# Patient Record
Sex: Male | Born: 1945
Health system: Southern US, Community
[De-identification: ages and names within clinical notes are randomized; demographics above are authoritative.]

## PROBLEM LIST (undated history)

## (undated) DIAGNOSIS — I2510 Atherosclerotic heart disease of native coronary artery without angina pectoris: Secondary | ICD-10-CM

## (undated) DIAGNOSIS — Z9289 Personal history of other medical treatment: Secondary | ICD-10-CM

## (undated) DIAGNOSIS — G4730 Sleep apnea, unspecified: Secondary | ICD-10-CM

## (undated) DIAGNOSIS — E079 Disorder of thyroid, unspecified: Secondary | ICD-10-CM

## (undated) DIAGNOSIS — E039 Hypothyroidism, unspecified: Secondary | ICD-10-CM

## (undated) DIAGNOSIS — E785 Hyperlipidemia, unspecified: Secondary | ICD-10-CM

## (undated) DIAGNOSIS — J449 Chronic obstructive pulmonary disease, unspecified: Secondary | ICD-10-CM

## (undated) DIAGNOSIS — J4489 Other specified chronic obstructive pulmonary disease: Secondary | ICD-10-CM

## (undated) DIAGNOSIS — M129 Arthropathy, unspecified: Secondary | ICD-10-CM

## (undated) DIAGNOSIS — I10 Essential (primary) hypertension: Secondary | ICD-10-CM

## (undated) DIAGNOSIS — R51 Headache: Secondary | ICD-10-CM

## (undated) DIAGNOSIS — I209 Angina pectoris, unspecified: Secondary | ICD-10-CM

## (undated) DIAGNOSIS — M1990 Unspecified osteoarthritis, unspecified site: Secondary | ICD-10-CM

## (undated) HISTORY — PX: HIP ARTHROPLASTY: SHX981

## (undated) HISTORY — PX: CORONARY STENT PLACEMENT: SHX1402

## (undated) HISTORY — DX: Other specified chronic obstructive pulmonary disease: J44.89

## (undated) HISTORY — PX: HEMORRHOID SURGERY: SHX153

## (undated) HISTORY — DX: Essential (primary) hypertension: I10

## (undated) HISTORY — PX: ANKLE SURGERY: SHX546

## (undated) HISTORY — DX: Arthropathy, unspecified: M12.9

## (undated) HISTORY — PX: TONSILLECTOMY: SUR1361

## (undated) HISTORY — DX: Sleep apnea, unspecified: G47.30

## (undated) HISTORY — DX: Hyperlipidemia, unspecified: E78.5

## (undated) HISTORY — DX: Atherosclerotic heart disease of native coronary artery without angina pectoris: I25.10

## (undated) HISTORY — DX: Chronic obstructive pulmonary disease, unspecified: J44.9

---

## 1999-05-05 ENCOUNTER — Inpatient Hospital Stay (HOSPITAL_COMMUNITY): Admission: EM | Admit: 1999-05-05 | Discharge: 1999-05-07 | Payer: Self-pay | Admitting: *Deleted

## 1999-05-05 ENCOUNTER — Encounter: Payer: Self-pay | Admitting: *Deleted

## 2000-09-07 ENCOUNTER — Encounter (HOSPITAL_COMMUNITY): Admission: RE | Admit: 2000-09-07 | Discharge: 2000-10-07 | Payer: Self-pay | Admitting: Rheumatology

## 2000-09-07 ENCOUNTER — Encounter: Payer: Self-pay | Admitting: Rheumatology

## 2001-01-09 ENCOUNTER — Ambulatory Visit: Admission: RE | Admit: 2001-01-09 | Discharge: 2001-01-09 | Payer: Self-pay | Admitting: Pulmonary Disease

## 2001-06-13 ENCOUNTER — Ambulatory Visit (HOSPITAL_COMMUNITY): Admission: RE | Admit: 2001-06-13 | Discharge: 2001-06-13 | Payer: Self-pay | Admitting: *Deleted

## 2001-06-13 ENCOUNTER — Encounter: Payer: Self-pay | Admitting: *Deleted

## 2005-02-08 ENCOUNTER — Ambulatory Visit (HOSPITAL_COMMUNITY): Admission: RE | Admit: 2005-02-08 | Discharge: 2005-02-08 | Payer: Self-pay | Admitting: Orthopedic Surgery

## 2005-03-02 ENCOUNTER — Encounter: Admission: RE | Admit: 2005-03-02 | Discharge: 2005-03-17 | Payer: Self-pay | Admitting: Orthopedic Surgery

## 2005-03-23 ENCOUNTER — Ambulatory Visit: Payer: Self-pay | Admitting: Cardiology

## 2005-03-23 ENCOUNTER — Inpatient Hospital Stay (HOSPITAL_COMMUNITY): Admission: EM | Admit: 2005-03-23 | Discharge: 2005-03-25 | Payer: Self-pay | Admitting: Emergency Medicine

## 2005-04-14 ENCOUNTER — Ambulatory Visit: Payer: Self-pay | Admitting: Cardiology

## 2005-05-18 ENCOUNTER — Ambulatory Visit: Payer: Self-pay | Admitting: Cardiology

## 2005-10-12 ENCOUNTER — Ambulatory Visit: Payer: Self-pay | Admitting: Cardiology

## 2005-10-18 ENCOUNTER — Ambulatory Visit: Payer: Self-pay

## 2006-04-12 ENCOUNTER — Ambulatory Visit: Payer: Self-pay | Admitting: Cardiology

## 2006-04-12 LAB — CONVERTED CEMR LAB
ALT: 15 units/L (ref 0–40)
AST: 17 units/L (ref 0–37)
Albumin: 3.7 g/dL (ref 3.5–5.2)
Alkaline Phosphatase: 63 units/L (ref 39–117)
BUN: 13 mg/dL (ref 6–23)
Bilirubin, Direct: 0.1 mg/dL (ref 0.0–0.3)
Chloride: 109 meq/L (ref 96–112)
Direct LDL: 70 mg/dL
GFR calc non Af Amer: 72 mL/min
Glucose, Bld: 100 mg/dL — ABNORMAL HIGH (ref 70–99)
Hgb A1c MFr Bld: 5.9 % (ref 4.6–6.0)
Potassium: 4.4 meq/L (ref 3.5–5.1)
Sodium: 142 meq/L (ref 135–145)
TSH: 1.51 microintl units/mL (ref 0.35–5.50)
VLDL: 56 mg/dL — ABNORMAL HIGH (ref 0–40)

## 2007-04-10 ENCOUNTER — Ambulatory Visit: Payer: Self-pay | Admitting: Cardiology

## 2007-04-17 ENCOUNTER — Ambulatory Visit: Payer: Self-pay | Admitting: Cardiology

## 2007-04-17 ENCOUNTER — Ambulatory Visit: Payer: Self-pay

## 2007-04-17 LAB — CONVERTED CEMR LAB
AST: 20 units/L (ref 0–37)
Albumin: 3.8 g/dL (ref 3.5–5.2)
Alkaline Phosphatase: 47 units/L (ref 39–117)
Bilirubin, Direct: 0.1 mg/dL (ref 0.0–0.3)
Chloride: 107 meq/L (ref 96–112)
Cholesterol: 149 mg/dL (ref 0–200)
HDL: 43 mg/dL (ref >39.0)
LDL Cholesterol: 89 mg/dL (ref 0–99)
Total Protein: 6.5 g/dL (ref 6.0–8.3)
Triglycerides: 86 mg/dL (ref 0–149)
VLDL: 17 mg/dL (ref 0–40)

## 2007-05-01 ENCOUNTER — Ambulatory Visit: Payer: Self-pay | Admitting: Gastroenterology

## 2007-05-01 ENCOUNTER — Encounter: Payer: Self-pay | Admitting: Gastroenterology

## 2007-05-01 DIAGNOSIS — K922 Gastrointestinal hemorrhage, unspecified: Secondary | ICD-10-CM | POA: Insufficient documentation

## 2007-05-01 DIAGNOSIS — M129 Arthropathy, unspecified: Secondary | ICD-10-CM | POA: Insufficient documentation

## 2007-05-01 DIAGNOSIS — J449 Chronic obstructive pulmonary disease, unspecified: Secondary | ICD-10-CM

## 2007-05-01 DIAGNOSIS — G4730 Sleep apnea, unspecified: Secondary | ICD-10-CM | POA: Insufficient documentation

## 2007-05-01 DIAGNOSIS — M1710 Unilateral primary osteoarthritis, unspecified knee: Secondary | ICD-10-CM

## 2007-05-01 DIAGNOSIS — I10 Essential (primary) hypertension: Secondary | ICD-10-CM

## 2007-05-01 DIAGNOSIS — E785 Hyperlipidemia, unspecified: Secondary | ICD-10-CM | POA: Insufficient documentation

## 2007-05-14 ENCOUNTER — Encounter: Payer: Self-pay | Admitting: Gastroenterology

## 2007-05-14 ENCOUNTER — Ambulatory Visit: Payer: Self-pay | Admitting: Gastroenterology

## 2007-05-16 ENCOUNTER — Encounter: Payer: Self-pay | Admitting: Gastroenterology

## 2008-03-31 ENCOUNTER — Inpatient Hospital Stay (HOSPITAL_COMMUNITY): Admission: RE | Admit: 2008-03-31 | Discharge: 2008-04-03 | Payer: Self-pay | Admitting: Orthopedic Surgery

## 2008-08-05 ENCOUNTER — Ambulatory Visit: Payer: Self-pay | Admitting: Cardiology

## 2008-08-06 ENCOUNTER — Encounter: Payer: Self-pay | Admitting: Internal Medicine

## 2008-08-06 ENCOUNTER — Ambulatory Visit: Payer: Self-pay | Admitting: Internal Medicine

## 2008-08-06 ENCOUNTER — Encounter: Payer: Self-pay | Admitting: Cardiology

## 2008-08-06 ENCOUNTER — Inpatient Hospital Stay (HOSPITAL_COMMUNITY): Admission: EM | Admit: 2008-08-06 | Discharge: 2008-08-08 | Payer: Self-pay | Admitting: Emergency Medicine

## 2008-08-11 ENCOUNTER — Encounter: Payer: Self-pay | Admitting: Internal Medicine

## 2008-08-15 ENCOUNTER — Encounter: Payer: Self-pay | Admitting: Gastroenterology

## 2008-08-18 ENCOUNTER — Telehealth (INDEPENDENT_AMBULATORY_CARE_PROVIDER_SITE_OTHER): Payer: Self-pay | Admitting: *Deleted

## 2008-09-18 ENCOUNTER — Ambulatory Visit: Payer: Self-pay | Admitting: Cardiology

## 2008-09-18 ENCOUNTER — Encounter (INDEPENDENT_AMBULATORY_CARE_PROVIDER_SITE_OTHER): Payer: Self-pay | Admitting: *Deleted

## 2008-09-18 DIAGNOSIS — K219 Gastro-esophageal reflux disease without esophagitis: Secondary | ICD-10-CM | POA: Insufficient documentation

## 2008-09-25 ENCOUNTER — Ambulatory Visit (HOSPITAL_COMMUNITY): Admission: RE | Admit: 2008-09-25 | Discharge: 2008-09-25 | Payer: Self-pay | Admitting: Orthopedic Surgery

## 2008-10-16 ENCOUNTER — Encounter (INDEPENDENT_AMBULATORY_CARE_PROVIDER_SITE_OTHER): Payer: Self-pay | Admitting: *Deleted

## 2008-11-02 ENCOUNTER — Encounter: Admission: RE | Admit: 2008-11-02 | Discharge: 2008-11-02 | Payer: Self-pay | Admitting: Orthopedic Surgery

## 2009-08-26 ENCOUNTER — Encounter (INDEPENDENT_AMBULATORY_CARE_PROVIDER_SITE_OTHER): Payer: Self-pay | Admitting: Orthopedic Surgery

## 2009-08-26 ENCOUNTER — Inpatient Hospital Stay (HOSPITAL_COMMUNITY): Admission: RE | Admit: 2009-08-26 | Discharge: 2009-08-28 | Payer: Self-pay | Admitting: Orthopedic Surgery

## 2009-11-27 ENCOUNTER — Emergency Department (HOSPITAL_COMMUNITY): Admission: EM | Admit: 2009-11-27 | Discharge: 2009-11-27 | Payer: Self-pay | Admitting: Emergency Medicine

## 2010-03-18 LAB — CBC
HCT: 32.4 % — ABNORMAL LOW (ref 39.0–52.0)
HCT: 34.2 % — ABNORMAL LOW (ref 39.0–52.0)
HCT: 46.6 % (ref 39.0–52.0)
Hemoglobin: 10.9 g/dL — ABNORMAL LOW (ref 13.0–17.0)
Hemoglobin: 11.7 g/dL — ABNORMAL LOW (ref 13.0–17.0)
MCH: 31.8 pg (ref 26.0–34.0)
MCV: 91 fL (ref 78.0–100.0)
MCV: 91.3 fL (ref 78.0–100.0)
MCV: 92.4 fL (ref 78.0–100.0)
Platelets: 162 10*3/uL (ref 150–400)
Platelets: 173 10*3/uL (ref 150–400)
RBC: 3.55 MIL/uL — ABNORMAL LOW (ref 4.22–5.81)
RBC: 3.7 MIL/uL — ABNORMAL LOW (ref 4.22–5.81)
RDW: 13.6 % (ref 11.5–15.5)
WBC: 6.8 10*3/uL (ref 4.0–10.5)
WBC: 9 10*3/uL (ref 4.0–10.5)
WBC: 9.8 10*3/uL (ref 4.0–10.5)

## 2010-03-18 LAB — URINALYSIS, ROUTINE W REFLEX MICROSCOPIC
Bilirubin Urine: NEGATIVE
Ketones, ur: NEGATIVE mg/dL
Urobilinogen, UA: 1 mg/dL (ref 0.0–1.0)
pH: 6 (ref 5.0–8.0)

## 2010-03-18 LAB — DIFFERENTIAL
Basophils Absolute: 0 10*3/uL (ref 0.0–0.1)
Basophils Relative: 0 % (ref 0–1)
Eosinophils Absolute: 0.1 10*3/uL (ref 0.0–0.7)
Lymphocytes Relative: 41 % (ref 12–46)
Monocytes Absolute: 0.7 10*3/uL (ref 0.1–1.0)
Monocytes Relative: 10 % (ref 3–12)
Neutrophils Relative %: 47 % (ref 43–77)

## 2010-03-18 LAB — BASIC METABOLIC PANEL
BUN: 12 mg/dL (ref 6–23)
CO2: 25 mEq/L (ref 19–32)
CO2: 28 mEq/L (ref 19–32)
Chloride: 104 mEq/L (ref 96–112)
GFR calc Af Amer: >60 mL/min (ref >60)
Glucose, Bld: 118 mg/dL — ABNORMAL HIGH (ref 70–99)
Potassium: 4.2 mEq/L (ref 3.5–5.1)
Sodium: 136 mEq/L (ref 135–145)
Sodium: 140 mEq/L (ref 135–145)

## 2010-03-18 LAB — ANAEROBIC CULTURE

## 2010-03-18 LAB — APTT: aPTT: 28 seconds (ref 24–37)

## 2010-03-18 LAB — WOUND CULTURE: Culture: NO GROWTH

## 2010-03-18 LAB — GRAM STAIN

## 2010-03-18 LAB — TYPE AND SCREEN: ABO/RH(D): O POS

## 2010-04-10 LAB — COMPREHENSIVE METABOLIC PANEL
BUN: 14 mg/dL (ref 6–23)
Calcium: 8.8 mg/dL (ref 8.4–10.5)
Glucose, Bld: 109 mg/dL — ABNORMAL HIGH (ref 70–99)
Total Protein: 6 g/dL (ref 6.0–8.3)

## 2010-04-10 LAB — BASIC METABOLIC PANEL
CO2: 23 mEq/L (ref 19–32)
Calcium: 8.9 mg/dL (ref 8.4–10.5)
GFR calc Af Amer: >60 mL/min (ref >60)
GFR calc Af Amer: >60 mL/min (ref >60)
GFR calc non Af Amer: >60 mL/min (ref >60)
GFR calc non Af Amer: >60 mL/min (ref >60)
Glucose, Bld: 121 mg/dL — ABNORMAL HIGH (ref 70–99)
Potassium: 3.7 mEq/L (ref 3.5–5.1)
Potassium: 3.8 mEq/L (ref 3.5–5.1)
Sodium: 137 mEq/L (ref 135–145)
Sodium: 137 mEq/L (ref 135–145)

## 2010-04-10 LAB — LIPID PANEL
LDL Cholesterol: 75 mg/dL (ref 0–99)
Total CHOL/HDL Ratio: 2.9 RATIO
VLDL: 12 mg/dL (ref 0–40)

## 2010-04-10 LAB — WOUND CULTURE: Gram Stain: NONE SEEN

## 2010-04-10 LAB — CBC
HCT: 39.4 % (ref 39.0–52.0)
Hemoglobin: 13.7 g/dL (ref 13.0–17.0)
Hemoglobin: 14 g/dL (ref 13.0–17.0)
MCHC: 34.4 g/dL (ref 30.0–36.0)
RBC: 4.27 MIL/uL (ref 4.22–5.81)
RBC: 4.41 MIL/uL (ref 4.22–5.81)
RDW: 14.3 % (ref 11.5–15.5)

## 2010-04-10 LAB — POCT CARDIAC MARKERS
CKMB, poc: <1 ng/mL — ABNORMAL LOW (ref 1.0–8.0)
Myoglobin, poc: 78 ng/mL (ref 12–200)
Troponin i, poc: <0.05 ng/mL (ref 0.00–0.09)

## 2010-04-10 LAB — DIFFERENTIAL
Lymphocytes Relative: 14 % (ref 12–46)
Monocytes Absolute: 1.1 10*3/uL — ABNORMAL HIGH (ref 0.1–1.0)
Monocytes Relative: 10 % (ref 3–12)
Neutro Abs: 8.5 10*3/uL — ABNORMAL HIGH (ref 1.7–7.7)

## 2010-04-10 LAB — CK TOTAL AND CKMB (NOT AT ARMC)
Relative Index: INVALID (ref 0.0–2.5)
Total CK: 95 U/L (ref 7–232)

## 2010-04-10 LAB — CARDIAC PANEL(CRET KIN+CKTOT+MB+TROPI)
Relative Index: INVALID (ref 0.0–2.5)
Total CK: 67 U/L (ref 7–232)
Total CK: 72 U/L (ref 7–232)

## 2010-04-10 LAB — HEMOGLOBIN A1C
Hgb A1c MFr Bld: 5.8 % (ref 4.6–6.1)
Mean Plasma Glucose: 120 mg/dL

## 2010-04-10 LAB — PROTIME-INR
INR: 1.1 (ref 0.00–1.49)
Prothrombin Time: 14.5 seconds (ref 11.6–15.2)

## 2010-04-14 LAB — CBC
Platelets: 206 10*3/uL (ref 150–400)
WBC: 8.7 10*3/uL (ref 4.0–10.5)

## 2010-04-14 LAB — PROTIME-INR
INR: 1.7 — ABNORMAL HIGH (ref 0.00–1.49)
Prothrombin Time: 21.3 seconds — ABNORMAL HIGH (ref 11.6–15.2)

## 2010-04-15 LAB — BASIC METABOLIC PANEL
BUN: 12 mg/dL (ref 6–23)
CO2: 25 mEq/L (ref 19–32)
Chloride: 102 mEq/L (ref 96–112)
Creatinine, Ser: 1.18 mg/dL (ref 0.4–1.5)
Creatinine, Ser: 1.22 mg/dL (ref 0.4–1.5)
GFR calc Af Amer: >60 mL/min (ref >60)
GFR calc non Af Amer: >60 mL/min (ref >60)
Glucose, Bld: 104 mg/dL — ABNORMAL HIGH (ref 70–99)
Potassium: 4 mEq/L (ref 3.5–5.1)
Sodium: 133 mEq/L — ABNORMAL LOW (ref 135–145)

## 2010-04-15 LAB — URINALYSIS, ROUTINE W REFLEX MICROSCOPIC
Glucose, UA: 100 mg/dL — AB
Hgb urine dipstick: NEGATIVE
Ketones, ur: NEGATIVE mg/dL
Protein, ur: NEGATIVE mg/dL
Urobilinogen, UA: 1 mg/dL (ref 0.0–1.0)

## 2010-04-15 LAB — CBC
HCT: 29.5 % — ABNORMAL LOW (ref 39.0–52.0)
HCT: 34.3 % — ABNORMAL LOW (ref 39.0–52.0)
Hemoglobin: 10.4 g/dL — ABNORMAL LOW (ref 13.0–17.0)
Hemoglobin: 12 g/dL — ABNORMAL LOW (ref 13.0–17.0)
MCHC: 34.8 g/dL (ref 30.0–36.0)
MCHC: 35.3 g/dL (ref 30.0–36.0)
MCV: 92.6 fL (ref 78.0–100.0)
MCV: 92.6 fL (ref 78.0–100.0)
MCV: 93.4 fL (ref 78.0–100.0)
Platelets: 245 10*3/uL (ref 150–400)
RBC: 3.18 MIL/uL — ABNORMAL LOW (ref 4.22–5.81)
RBC: 3.68 MIL/uL — ABNORMAL LOW (ref 4.22–5.81)
RDW: 13.4 % (ref 11.5–15.5)
RDW: 13.7 % (ref 11.5–15.5)
WBC: 6.2 10*3/uL (ref 4.0–10.5)
WBC: 9 10*3/uL (ref 4.0–10.5)

## 2010-04-15 LAB — PROTIME-INR
INR: 1 (ref 0.00–1.49)
INR: 1.9 — ABNORMAL HIGH (ref 0.00–1.49)
Prothrombin Time: 13.2 seconds (ref 11.6–15.2)
Prothrombin Time: 16.1 seconds — ABNORMAL HIGH (ref 11.6–15.2)

## 2010-04-15 LAB — DIFFERENTIAL
Basophils Absolute: 0 10*3/uL (ref 0.0–0.1)
Eosinophils Absolute: 0.1 10*3/uL (ref 0.0–0.7)
Lymphocytes Relative: 40 % (ref 12–46)
Lymphs Abs: 2.5 10*3/uL (ref 0.7–4.0)
Neutrophils Relative %: 47 % (ref 43–77)

## 2010-04-15 LAB — TYPE AND SCREEN: ABO/RH(D): O POS

## 2010-04-15 LAB — ABO/RH: ABO/RH(D): O POS

## 2010-04-23 ENCOUNTER — Ambulatory Visit (INDEPENDENT_AMBULATORY_CARE_PROVIDER_SITE_OTHER): Payer: Medicare Other | Admitting: Urology

## 2010-04-23 DIAGNOSIS — L723 Sebaceous cyst: Secondary | ICD-10-CM

## 2010-05-18 NOTE — Assessment & Plan Note (Signed)
[facility]                            CARDIOLOGY OFFICE NOTE   NAME:[name], [name]                     MRN:          [id number]  DATE:04/10/2007                            DOB:          [birth year]    ADDENDUM:  It appears he is not on his Lipitor at least by my records.  Will have to check fasting lipids and see what his results are and then  try to get him back on a statin.   PHYSICAL EXAMINATION:  VITAL SIGNS:  His blood pressure today is 149/80.  I rechecked it, and it was 130/80.  His pulse is 61 and regular.  Weight  is 245, up 9 pounds.  HEENT:  Ruddy complexion.  PERRLA.  Extraocular movements intact.  Sclerae are clear.  Facial symmetry is normal.  NECK:  Carotid upstrokes are equal bilaterally without bruits, no JVD.  Thyroid is not enlarged.  Neck is supple.  LUNGS:  Clear.  HEART:  Nondisplaced PMI.  Normal S1-S2, split S2.  ABDOMEN:  Protuberant, good bowel sounds.  There was no obvious  organomegaly.  Bowel sounds are present.  EXTREMITIES:  No cyanosis or clubbing.  1+ edema.  Pulses are intact.  NEUROLOGIC:  Intact.  MUSCULOSKELETAL:  Chronic arthritic changes.   Electrocardiogram shows sinus brady with left axis deviation.   ASSESSMENT AND PLAN:  [name] needs to do some major lifestyle  changes.  We have talked about low-sodium diets at length.  We have also  talked about dropping about 9 pounds.  His risk of diabetes is  substantial.   We also need to check fasting lipids and a comprehensive metabolic  panel.  I am not sure why he is not on a statin.   We will arrange for him to have an adenosine rest stress Myoview to  follow up on his stent.     Thomas C. Daleen Squibb, MD, [facility]     TCW/MedQ  DD: 04/10/2007  DT: 04/10/2007  Job #: [id number]

## 2010-05-18 NOTE — Assessment & Plan Note (Signed)
[facility]                            CARDIOLOGY OFFICE NOTE   [name]                     MRN:          [id number]  DATE:04/10/2007                            DOB:          1945-04-06    [name] returns today.   PROBLEM LIST:  1. Coronary artery disease.  He is 2 years out from a drug-eluting      stent to the mid circumflex.  He has had multiple interventions      before.  Please refer to the chart.  He is having occasional      episodes of chest tightness but it is really not exertion related.      He is not taking nitroglycerin.  He has significant dyspnea on      exertion which I think is probably noncardiac.  2. Hyperlipidemia.  He has not had his lipids checked in a year.  He      says these are checked by his primary.  3. Mild obesity.  He has gained 9 pounds.  4. Hypertension.  He unfortunately is eating a fair amount of salt.   He denies orthopnea, PND or peripheral edema.   CURRENT MEDICATIONS:  1. Aspirin 325 mg a day.  2. Metoprolol 12.5 mg p.o. daily.  3. Lisinopril 10 mg a daily.  4. Levothyroxine 175 mcg a day.   It appears he is not on his Lipitor at least by my records.  Will have  to check fasting lipids and see what his results are and then try to get  him back on a statin.   PHYSICAL EXAMINATION:  VITAL SIGNS:  His blood pressure today is 149/80.  I rechecked it, and it was 130/80.  His pulse is 61 and regular.  Weight  is 245, up 9 pounds.  HEENT:  Ruddy complexion.  PERRLA.  Extraocular movements intact.  Sclerae are clear.  Facial symmetry is normal.  NECK:  Carotid upstrokes are equal bilaterally without bruits, no JVD.  Thyroid is not enlarged.  Neck is supple.  LUNGS:  Clear.  HEART:  Nondisplaced PMI.  Normal S1-S2, split S2.  ABDOMEN:  Protuberant, good bowel sounds.  There was no obvious  organomegaly.  Bowel sounds are present.  EXTREMITIES:  No cyanosis or clubbing.  1+ edema.  Pulses  are intact.  NEUROLOGIC:  Intact.  MUSCULOSKELETAL:  Chronic arthritic changes.   Electrocardiogram shows sinus brady with left axis deviation.   ASSESSMENT AND PLAN:  [name] needs to do some major lifestyle  changes.  We have talked about low-sodium diets at length.  We have also  talked about dropping about 9 pounds.  His risk of diabetes is  substantial.   We also need to check fasting lipids and a comprehensive metabolic  panel.  I am not sure why he is not on a statin.   We will arrange for him to have an adenosine rest stress Myoview to  follow up on his stent.     Charles C. Daleen Squibb, MD, [facility]  Electronically Signed    TCW/MedQ  DD: 04/10/2007  DT: 04/10/2007  Job #: [id number]

## 2010-05-18 NOTE — Op Note (Signed)
[name], PRINTUP NO.:  [id number]   MEDICAL RECORD NO.:  [id number]          PATIENT TYPE:  INP   LOCATION:  2899                         FACILITY:  [facility]   PHYSICIAN:  Feliberto Gottron. Turner Daniels, M.D.   DATE OF BIRTH:  October 09, 1945   DATE OF PROCEDURE:  03/31/2008  DATE OF DISCHARGE:                               OPERATIVE REPORT   PREOPERATIVE DIAGNOSIS:  Osteoarthritis of the right hip.   POSTOPERATIVE DIAGNOSIS:  Osteoarthritis of the right hip.   PROCEDURE:  Right total hip arthroplasty using DePuy 54-mm ASR cup, 20 x  15 x 165 x 42 SROM stem, a 20-D large cone NK+0 47-mm Ultimate head.   SURGEON:  Feliberto Gottron.  Turner Daniels, MD   FIRST ASSISTANT:  Shirl Harris PA-C   ANESTHESIA:  General endotracheal.   ESTIMATED BLOOD LOSS:  400 mL.   FLUID REPLACEMENT:  1600 mL of crystalloid.   DRAINS:  Foley catheter.   URINE OUTPUT:  300 mL.   INDICATIONS FOR PROCEDURE:  A 64 year old man with end-stage arthritis  of the right hip who has failed conservative treatment with anti-  inflammatory medicine, exercises, physical therapy, intra-articular  cortisone injections that provided temporary relief, now desires  elective right total hip arthroplasty.  Risks and benefits of surgery  had been discussed, questions answered.  The pain wakes him up at night  and prevents him from doing his normal activities.  We are going to be  using a metal-on-metal bearing system because he is a very active  gentleman who will benefit from a high demand hip.   DESCRIPTION OF PROCEDURE:  The patient identified by armband and  received IV preoperative antibiotics in the holding area at [facility] and taken to operating room 15.  Appropriate anesthetic  monitors were attached and general endotracheal anesthesia was induced  with the patient in a supine position.  A Foley catheter was inserted.  He was rolled into the left lateral decubitus position and the right  lower extremity  prepped and draped in the usual sterile fashion from the  ankle to the hemipelvis.  A standard time-out procedure was then  performed.  The skin along the lateral hip and thigh infiltrated with 20  mL of 0.5% Marcaine and epinephrine solution and a 15-cm incision  centered over the greater trochanter was made through the skin and  subcutaneous tissue down to the level of the IT band.  The IT band was  cut along the skin incision allowing posterolateral approach to the hip.  Cobra retractors were placed between the gluteus minimus and the  superior hip joint capsule between the quadratus femoris and the  inferior hip joint capsule isolating the piriformis and short external  rotators which were then tagged with a #2 Ethibond suture and cut off  their insertion on the intertrochanteric crest.  This exposed posterior  aspect of the hip joint capsule which was then developed into an  acetabular based flap and likewise tagged with two #2 Ethibond sutures.  One-half of the rectus femoris origin was then released.  The hip was  flexed and internally rotated dislocating the femoral head.  Standard  neck cut performed 1 fingerbreadth above the lesser trochanter.  The  femoral head was examined and found to have bony erosion consistent with  osteoarthritis.  The proximal femur was then translated anteriorly using  a Homan retractor levering off the superior and anterior column,  exposing the acetabulum.  A posterior-inferior wing retractor was placed  at the junction of the ischium and the acetabulum and a spike Cobra in  the cotyloid notch.  This allowed [place] to remove the labrum and  sequentially ream the acetabulum up to a 53-mm basket reamer obtaining  good coverage in all quadrants.  We then touched the rim with a 54-mm  basket reamer and irrigated out with normal saline solution.  A 54-mm  ASR cup was then inserted in 45 degrees of abduction and about 20  degrees of anteversion obtaining good  fit and fill.  He could basically  life his pelvis off the table with the inserter after the implant had  been inserted.  The hip was then flexed and internally rotated exposing  the proximal femur which was entered with a box cutting chisel, the  initiating reamer with the canal finder and then sequentially reamed up  to a 14-mm axial reamer where we just started to get a little bit of  chatter and we sequentially reamed up to a 15.5 axial reamer to the  appropriate depth for a 20 x 15 stem.  We then partially reamed to 16  about two-thirds of the way down the canal.  We then conically reamed up  to a 20-D cone and did our calcar milling to a 20-D large.  The 20-D  large trial was then placed into the proximal femur followed by the  trial stem with an NK+0 47-mm trial in the same version as the calcar  about 15-20 degrees.  The hip was then relocated and taken through range  of motion.  It could not be dislocated.  In extension with external  rotation, he could flex to 90 with about 70-80 of internal rotation  before any instability was noted.  Some small peripheral osteophytes  behind the acetabulum were removed at this point.  The trial components  were removed.  The femur was irrigated out with normal saline solution.  A 20-D large ZTT cone was then hammered into place with the inserter  followed by a 20 x 15 x 165 x 42 stem in the same version as the cone.  This seated nicely and a NK+0 47-mm Ultimate ball was hammered on the  stem and the hip was reduced.  We checked our stability one more time,  it was excellent.  We repaired the capsular flap and the piriformis back  to the intertrochanteric crest through drill holes with a #2 Ethibond  suture, closed the IT band with running #1 Vicryl suture after  irrigating out one more time and going after any small bleeders with the  electrocautery.  The subcutaneous tissue was then closed with 0 and 2-0  undyed Vicryl suture and the skin with  running interlocking 3-0 nylon  suture.  A dressing of Xeroform and Mepilex was then applied.  The  patient was unclamped, rolled supine, awakened, and taken to the  recovery room without difficulty.      Feliberto Gottron. Turner Daniels, M.D.  Electronically Signed     FJR/MEDQ  D:  03/31/2008  T:  03/31/2008  Job:  [id number]

## 2010-05-18 NOTE — Discharge Summary (Signed)
[name], [name]              ACCOUNT NO.:  [id number]   MEDICAL RECORD NO.:  [id number]          PATIENT TYPE:  INP   LOCATION:  3306                         FACILITY:  [facility]   PHYSICIAN:  Noralyn Pick. Eden Emms, MD, FACCDATE OF BIRTH:  1945-07-28   DATE OF ADMISSION:  08/05/2008  DATE OF DISCHARGE:  08/08/2008                               DISCHARGE SUMMARY   PRIMARY CARDIOLOGIST:  Maisie Fus C. Wall, MD, [facility]   PRIMARY CARE PHYSICIAN:  Not listed.   PROCEDURES PERFORMED DURING HOSPITALIZATION:  1. Cardiac catheterization.  This was completed on August 05, 2008 by      Dr. Charlies Constable.      a.     Nonobstructive coronary artery disease with 30% narrowing in       the proximal left anterior descending artery, 50% narrowing in the       mid circumflex artery and 50% narrowing in the first posterior       lateral branch of the circumflex artery and 20% narrowing within       the stent in the proximal circumflex artery, 70% narrowing in the       mid right coronary artery with normal left ventricular function,       ejection fraction of 60%.  2. EGD which was dated August 06, 2008.  3. I and D of right axillary abscess on August 07, 2008.   FINAL DISCHARGE DIAGNOSES:  1. Chest pain.  2. Known history of coronary artery disease.      a.     Status post cardiac catheterization this admission.  3. Hypertension.  4. Dyslipidemia.  5. Hypothyroidism.  6. Right axillary cellulitis from questionable insect bite.   HOSPITAL COURSE:  This 65 year old Caucasian male with history of CAD  who presented to the emergency room with complaints of substernal chest  pain.  Approximately 2 weeks ago, he started having substernal chest  pain 3-4 times a day lasting 30-45 minutes at a time resolving  spontaneously.  On day of admission he began having pain around 2:00  p.m., described it as midsternal with squeezing sensation and severe  nausea and shortness of breath.  The pain had been constant for the  last  11 hours prior to admission and was relieved with sublingual  nitroglycerin.  The patient was seen and examined in the emergency room  by Armanda Magic, fellow for Dr. Daleen Squibb.  The patient was admitted to rule  out myocardial infarction with known history of CAD.  The patient was  subsequently placed on heparin and nitroglycerin, and cardiac enzymes  were cycled for further diagnostic evaluation.   The patient's cardiac enzymes were negative during hospitalization with  no EKG evidence of ischemia.  However, with recurrence of chest  discomfort in this patient, it was felt best to cath the gentleman for  further evaluation.   The patient did have cardiac catheterization as described on August 05, 2008 and was found to have nonobstructive CAD.  It was found that per  Dr. Juanda Chance that the lesion in the right coronary artery was not tight  enough  to be causing symptoms and currently not rest symptoms.  The  patient will have further evaluation of chest pain.  He had already had  a CT angio which was negative for pulmonary emboli.  It was suggested  that they plan a GI consultation as an outpatient, also outpatient  treadmill would be indicated to evaluate his right coronary artery  lesion, although it was not felt to be causing his current symptoms.  The followup stress Myoview will be discussed on a followup visit with  Dr. Daleen Squibb should he continue to have discomfort.   Infectious disease consult was requested secondary to the patient's  recurrent soft tissue abscess in the axillary area.  He was placed from  doxycycline p.o. to IV vancomycin and they suggested a General Surgery  consult for I and D.  It was found that the patient had MRSA in that  site and was continued on IV antibiotics.   The patient was seen the following day by Dr. Freida Busman and his physician  assistant and was found that the patient would benefit from an I and D  of the abscess.  He had an I and D completed on  August 07, 2008 with  packing placed.  He was placed on Septra DS p.o. b.i.d. for 1 week post  procedure.  The site was packed with sterile gauze and dressed.  Surgery  did follow up the day of discharge and the dressing was replaced and was  advised to remove the packing the day after discharge and continue to  keep it clean and was given a prescription by Surgery.    The patient was seen by GI, Dr. Lina Sar on August 06, 2008 secondary  to recurrent discomfort in his chest for GI evaluation and an EGD was  completed.  The patient was found to have stricture of the distal  esophagus, hiatal hernia, esophagitis in the distal esophagus, mild  gastritis in the antrum, otherwise normal examination.  Shallow distal  ES ulcer, mild stricture status post dilatation with 16 mm dilatator.  The patient was to await biopsy results.  The patient was to start on  PPIs twice a day for 2 weeks in the absence of cardiac disease, most  like the chest pain was GERD or esophageal spasm and esophagitis.   The patient was seen and examined by Dr. Charlton Haws on discharge and  found to be stable.  The patient will follow up with Dr. Daleen Squibb in  approximately 6 weeks.  He will also follow up with GI as an outpatient,  he is to make that appointment on his own and also follow up with  Surgery should the abscess become more inflamed, painful or have  drainage that continues to be infected.   DISCHARGE LABORATORY DATA:  Wound culture reveals no squamous epithelial  cells seen, rare Gram-positive cocci in clusters.   Sodium 137, potassium 3.7, chloride 105, CO2 of 26, glucose 121, BUN 10,  creatinine 0.90.  Hemoglobin 13.7, hematocrit 39.4, white blood cells  13.4, platelets 189, hemoglobin A1c 5.8.  TSH 2.288.  Cardiac enzymes  were found to be negative at 0.01 x3.  Cholesterol 134, triglycerides  60, HDL 47, LDL 75, magnesium 1.9.  CT angio of the chest completed on  August 4 revealing negative for pulmonary  emboli or acute  cardiopulmonary disease, infiltration of subcutaneous fat in the left  axilla, could be due to cellulitis, or possibly contusion, recommend  correlation with physical exam.  Emphysema  and simple appearing lipoma  in the left subscapularis.  CT head completed on August 06, 2008, no  acute finding, chronic microvascular ischemic changes are noted.  Chest  x-ray dated August 05, 2008, no acute disease.  EKG on discharge, normal  sinus rhythm with evidence of inferior Q waves noted in lead III and  aVF, ventricular rate of 70 beats per minute.   VITAL SIGNS ON DISCHARGE:  Temperature 98.7, heart rate 79, blood  pressure 136/69, O2 sat 94% on room air.  The patient's weight was not  recorded during this hospitalization.   DISCHARGE MEDICATIONS:  Please see discharge EMR sheet.   FOLLOWUP PLANS AND APPOINTMENTS:  1. The patient will follow up with Dr. Juanito Doom in approximately 6      weeks.  2. The patient will follow up with GI in approximately 6 weeks as      well.  3. The patient has been advised on new medication Septra DS one p.o.      b.i.d. x1 week.  4. The patient has been advised on Protonix 40 mg twice a day x2      weeks.  5. The patient has been advised by Surgery should his abscess dressing      become more erythematous, swollen, or      draining, it would appear to be infection or having increased pain      then he is to contact them. .  6. The patient has been advised to bring all medications to followup      appointments.   Time spent with the patient to include physician time, 40 minutes.      Bettey Mare. Lyman Bishop, NP      Noralyn Pick. Eden Emms, MD, [facility]  Electronically Signed    KML/MEDQ  D:  08/08/2008  T:  08/08/2008  Job:  [id number]   cc:   Hedwig Morton. Juanda Chance, MD

## 2010-05-18 NOTE — H&P (Signed)
[name], [name]              ACCOUNT NO.:  [id number]   MEDICAL RECORD NO.:  [id number]          PATIENT TYPE:  INP   LOCATION:  3306                         FACILITY:  [facility]   PHYSICIAN:  Armanda Magic, M.D.     DATE OF BIRTH:  03/17/1945   DATE OF ADMISSION:  08/05/2008  DATE OF DISCHARGE:                              HISTORY & PHYSICAL   REFERRING PHYSICIAN:  Emergency room.   CHIEF COMPLAINT:  Chest pain.   HISTORY OF PRESENT ILLNESS:  This is a 65 year old white male with a  history of coronary artery disease who presented to the emergency room  with complaints of substernal chest pain.  Around 2 weeks ago, he  started having substernal chest pain occurring 3 times per day lasting  about 30-45 minutes at a time and would resolve spontaneously.  Today,  he started having chest pain around 2 p.m., described as midsternal and  a squeezing sensation associated with severe nausea and shortness of  breath.  He also has been having headache.  Chest pain has been constant  for the past 11 hours and then relieved with sublingual nitroglycerin or  morphine.  Currently, chest pain is 7/10.  The pain is described as  different from his typical angina as well as the fact that he has had 11  hours of chest pain with no bump in his cardiac enzymes.   PAST MEDICAL HISTORY:  Coronary artery disease status post remote PCI of  the obtuse marginal and also a DES to the left circumflex in March 2007.  His last cath in 2007 at the time of his DES stent included 40% LAD, 90%  left circumflex, and 40% RCA.  He has a history of hypertension,  dyslipidemia, asthma, hypothyroidism, and obesity.   PAST SURGICAL HISTORY:  Status post hemorrhoidectomy, right ankle  repair, right hip replacement in 2010, and tonsillectomy.   MEDICATIONS:  1. Aspirin 325 mg daily.  2. Levothyroxine 150 mcg daily.  3. Lisinopril 20 mg 1/2 tablet daily.  4. Metoprolol 25 mg 1/2 tablet daily.  5. Simvastatin 40 mg a  day.  6. Hydrocodone p.r.n.  7. AndroGel pump.  8. Mobic 7.5 mg daily p.r.n.   ALLERGIES:  PERCOCET which causes nausea.   SOCIAL HISTORY:  He is married.  He denies any tobacco or alcohol use.   FAMILY HISTORY:  His mother died of a CVA and his father died of MI.   REVIEW OF SYSTEMS:  Otherwise as stated in the HPI includes redness and  swelling in the left axilla with possible insect bite, otherwise  negative.   PHYSICAL EXAMINATION:  VITAL SIGNS:  Blood pressure is 132/72, heart  rate 85, and respirations 14.  GENERAL:  He is a well-developed, obese white male in no acute distress.  HEENT:  Benign.  NECK:  Supple without lymphadenopathy.  Carotid upstrokes are +2  bilaterally.  No bruits.  LUNGS:  Clear to auscultation throughout.  HEART:  Regular rate and rhythm.  No murmurs, rubs, or gallops.  Normal  S1 and S2.  ABDOMEN:  Soft, nontender, and nondistended with  active bowel sounds.  No hepatosplenomegaly.  EXTREMITIES:  No cyanosis, erythema, or edema.  There is a small area of  erythema on the right axilla with questionable insect bite.   LABORATORY DATA:  Sodium 137, potassium 3.8, chloride 107, bicarb 23,  BUN 16, and creatinine 0.92.  White cell count 11.2, hematocrit 14,  hemoglobin 40.8, and platelet count 216.  CPK-MB less than 1, troponin  less than 0.05.  Myoglobin 78.  Head CT shows no acute findings.  There  is chronic microvascular disease.  Chest x-ray shows no acute disease.   ASSESSMENT:  1. Unstable angina with initial cardiac enzymes negative.  The      patient's pain has been constant for 12 hours with no enzyme      elevation.  2. Coronary artery disease.  3. Hypertension.  4. Dyslipidemia.  5. Hypothyroidism.  6. Right axilla cellulitis from questionable insect bite.   PLAN:  Admit to telemetry bed, rule out myocardial infarction with  serial cardiac enzymes, IV nitroglycerin drip for chest pain, subcu  Lovenox per Pharmacy for angina, aspirin  daily, doxycycline 100 mg  b.i.d. for right axillary cellulitis which will also cover if the  patient has had tick bite, further workup per Dr. Daleen Squibb.  We will make  n.p.o. after midnight, probable cath in a.m.      Armanda Magic, M.D.  Electronically Signed     TT/MEDQ  D:  08/06/2008  T:  08/06/2008  Job:  [id number]

## 2010-05-18 NOTE — Cardiovascular Report (Signed)
[name], [name]              ACCOUNT NO.:  [id number]   MEDICAL RECORD NO.:  [id number]          PATIENT TYPE:  INP   LOCATION:  3306                         FACILITY:  [facility]   PHYSICIAN:  Everardo Beals. Juanda Chance, MD, FACCDATE OF BIRTH:  May 21, 1945   DATE OF PROCEDURE:  08/05/2008  DATE OF DISCHARGE:                            CARDIAC CATHETERIZATION   CLINICAL HISTORY:  [name] is a 65 year old and had a previous  stent placed to the circumflex artery by Dr. Riley Kill in 2007.  This was  a Taxus drug-eluting stent.  He was admitted with recurrent chest pain  that has been persistent.  His ECG's and enzymes were negative.  Because  of persistent pain, he was brought to the lab urgently for evaluation.   PROCEDURE:  The procedure was performed by the right femoral arterial  sheath and 5-French preformed coronary catheters.  A front wall arterial  puncture was formed and Omnipaque contrast was used.  The patient  tolerated the procedure well and left the laboratory in satisfactory  condition.   RESULTS:  Left main coronary artery.  Left main coronary artery was free  of significant disease.   Left anterior descending artery.  Left anterior descending artery gave  rise to 2 diagonal branches and several septal perforators.  There was  irregularities and 30% narrowing in the proximal left anterior  descending, but no other major obstruction.   The circumflex artery.  The circumflex artery gave rise to an atrial  branch 2 small marginal branch, a second atrial branch, and 2 large  posterolateral branches.  There were irregularities throughout this  vessel with 50% narrowing in the midvessel and 60% narrowing in the  first posterolateral branch.  The stent in the proximal vessel had about  20% narrowing.   The right coronary artery.  The right coronary artery was a moderately  large vessel gave rise to a conus branch, 2 right ventricular branch,  posterior descending branch, and 3  small posterolateral branches.  There  was an area of narrowing in the midvessel over about 15-18 mm that was  about 70% narrowed.  The rest of vessels were free of significant  disease and there were irregularities.  The left ventriculogram.  Please  note left ventriculogram performed in the right anterior oblique  projection showed good wall motion with no areas of hypokinesis.  The  estimated ejection fraction was 60%.   CONCLUSION:  Nonobstructive coronary artery disease with 30% narrowing  in the proximal left anterior descending, 50% narrowing in the mid  circumflex artery and 50% narrowing in the first posterolateral branch  of the circumflex artery and 20% narrowing within the stent in the  proximal circumflex artery, and 70% narrowing in the mid-right coronary  with normal left ventricular function.   RECOMMENDATIONS:  I do not think the lesion in the right coronary artery  is tight enough to be causing symptoms and currently not rest symptoms.  We will plan further evaluation of his chest pain.  He has already had a  CT angio, which is negative for pulmonary embolism.  We will plan  GI  consultation.  I think an outpatient treadmill would be indicated to  evaluate his right coronary artery lesion, although I do not think it is  causing his current symptoms.      Bruce Elvera Lennox Juanda Chance, MD, [facility]  Electronically Signed     BRB/MEDQ  D:  08/06/2008  T:  08/06/2008  Job:  [id number]   cc:   Cardiopulmonary Laboratory

## 2010-05-18 NOTE — Consult Note (Signed)
NAMEKASIN, TONKINSON              ACCOUNT NO.:  [id number]   MEDICAL RECORD NO.:  [id number]           PATIENT TYPE:  INP   LOCATION:  3306                         FACILITY:  [facility]   PHYSICIAN:  Sharlet Salina T. Hoxworth, M.D.DATE OF BIRTH:  Dec 22, 1945   DATE OF CONSULTATION:  DATE OF DISCHARGE:                                 CONSULTATION   HISTORY OF PRESENT ILLNESS:  Mr. Stgermaine is a pleasant 65 year old male  who presented with chest pain with a known history of coronary artery  disease.  He was worked up from a cardiac standpoint, in fact, had a  cardiac cath due to his history of unstable angina.  The patient has had  previous percutaneous transluminal coronary angioplasty and was  concerned for restenosis.  His cardiac cath was apparently stable, but  in the mean time of this admission, the patient has had a progressively  enlarging erythematous nodule or cellulitis in the right axilla.  The  patient states this started several days prior to admission.  He has a  little bit bump, but it progressively gotten larger.  He has been  brought to the attention for surgical consultation as it is large enough  now, that it probably needs incision and drainage.   PAST MEDICAL HISTORY:  1. Pertinent for coronary artery disease.  2. Hypertension.  3. Hyperlipidemia.  4. Asthma.  5. Degenerative joint disease.  The patient denies any history of      diabetes or previous significant skin infection.   PAST SURGICAL HISTORY:  Pertinent for PTCA on multiple occasions and a  right total hip replacement earlier this year.   SOCIAL HISTORY:  Negative for alcohol or tobacco use.   ALLERGIES:  The patient reports PERCOCET allergy.   PHYSICAL EXAMINATION:  VITAL SIGNS:  Stable.  The patient is afebrile.  LUNGS:  Clear to auscultation.  HEART:  Regular rate and rhythm.  The right axillary area reveals a tender area of the erythema that is  indurated with central fluctuance and a small white  head.  This is most  consistent with abscess cavity.  Pending spontaneous drainage.  The  remainder of the extremity and skin exam was within normal limits  without additional lesions.   DIAGNOSTIC DATA:  White blood cell count of 13.4.   ASSESSMENT:  Right axillary abscess.   PLAN:  Discussed with the patient the need for incision and drainage.  He understands all the risks, complications and postprocedure  expectations in detail and will consent for procedure.  Please see  procedure note for separate details.      Brayton El, PA-C      Lorne Skeens. Hoxworth, M.D.  Electronically Signed    KB/MEDQ  D:  08/07/2008  T:  08/08/2008  Job:  [id number]

## 2010-05-21 ENCOUNTER — Encounter: Payer: Self-pay | Admitting: Cardiology

## 2010-05-21 NOTE — Cardiovascular Report (Signed)
[place]. [facility]  Patient:    [name], [name]                     MRN: [id number] Proc. Date: 06/06/99 Adm. Date:  [id number] Disc. Date: [id number] Attending:  Ronaldo Miyamoto CC:         Colon Flattery, D.O.             Thomas C. Wall, M.D. [facility]             Cardiac Catheterization Lab                        Cardiac Catheterization  PROCEDURE PERFORMED: 1. Left heart catheterization with coronary angiography and left    ventriculography. 2. Percutaneous transluminal coronary angioplasty with stent placement of    third obtuse marginal branch.  INDICATIONS:  [name] is a 65 year old male admitted with chest pain consistent with unstable angina.  CATHETERIZATION PROCEDURAL NOTE:  A 6-French sheath was placed in the right femoral artery.  Standard Judkins 6-French catheters were utilized.  Contrast was Omnipaque.  There were no complications.  RESULTS:  Hemodynamics:  Left ventricular pressure 136/20.  Aortic pressure 142/92. There was no aortic valve gradient.  Left ventriculogram:  Wall motion is normal.  Ejection fraction is greater than 60.  No mitral regurgitation.  Coronary arteriography (right dominant): 1. LAD has a 20% stenosis in the mid vessel.  The LAD gives rise to two    normal sized diagonal branches. 2. Left circumflex has a 30% stenosis proximally followed by a 25% stenosis in    the mid vessel, followed by a 50% stenosis in the mid vessel.  There is a    small OM-1, normal sized OM-2, and a normal sized OM-3.  OM-3 has a 75%    stenosis proximally followed by a 60% stenosis. 3. Right coronary artery has a proximal 30% stenosis and a mid 30% stenosis.    It gives rise to a normal sized posterior descending artery and three small    posterolateral branches.  IMPRESSIONS: 1. Normal left ventricular systolic function. 2. One-vessel coronary artery disease as described with what appears to be    significant disease in the  obtuse marginal branch.  PLAN:  Percutaneous intervention of the obtuse marginal.  See below.  PERCUTANEOUS TRANSLUMINAL CORONARY ANGIOPLASTY PROCEDURAL NOTE:  Following completion of diagnostic catheterization, we proceeded directly with coronary intervention.  The patient was enrolled in the CRUISE trial and randomized to receive Integrilin and enoxaparin.  We used a 7-French Voda left 3.5 guiding catheter and a BMW wire, and we initially performed PTCA with a 2.5 x 20-mm CrossSail balloon, inflating this to eight atmospheres.  This resulted in improved lumen; however, there was a longitudinal dissection.  We therefore placed a 2.5 x 18-mm Bx Velocity stent which was deployed at 10 atmospheres. The stent was then post dilated with a 2.5 x 15-mm Quantum Ranger balloon which was inflated to 14 atmospheres in the distal margin of the stent and 15 atmospheres in the proximal margin of the stent.  Final angiographic images revealed an excellent result with 0% residual stenosis and TIMI-3 flow.  COMPLICATIONS:  None.  RESULTS:  Successful PTCA with stent placement in the third obtuse marginal branch.  A 75%, followed by a 60%, stenosis were both reduced to 0% residual with TIMI-3 flow.  PLAN:  Integrilin will be continued for 18 hours.  Plavix will  be administered for four weeks. DD:  05/06/99 TD:  05/08/99 Job: [id number]

## 2010-05-21 NOTE — Discharge Summary (Signed)
NAME:  [name], [name] NO.:  [id number]   MEDICAL RECORD NO.:  [id number]          PATIENT TYPE:  INP   LOCATION:  5025                         FACILITY:  [facility]   PHYSICIAN:  Feliberto Gottron. Turner Daniels, M.D.   DATE OF BIRTH:  1945/01/20   DATE OF ADMISSION:  03/31/2008  DATE OF DISCHARGE:  04/03/2008                               DISCHARGE SUMMARY   CHIEF COMPLAINT:  Right hip pain.   HISTORY OF PRESENT ILLNESS:  This is a 65 year old gentleman who  complained of severe unremitting pain in his right hip despite  conservative treatment with steroid injection, pain medication, and  activity modification.  He desires a surgical intervention at this time.  All risks and benefits of surgery were discussed with the patient.   PAST MEDICAL HISTORY:  1. Hypertension.  2. Myocardial infarction.  3. Hypothyroidism.  4. Asthma.   PAST SURGICAL HISTORY:  He has had cardiac stent placed in 2004 and  2008.  He has had hemorrhoidectomy.   SOCIAL HISTORY:  He denies the use of tobacco or alcohol.   FAMILY HISTORY:  Significant for hypertension and coronary artery  disease.   ALLERGIES:  He has no known drug allergies.   CURRENT MEDICATIONS:  1. Aspirin 325 mg 1 p.o. daily.  2. Levothyroxine 150 mg 1 p.o. daily.  3. Lisinopril 20 mg 1-1/2 tablet p.o. daily.  4. Metoprolol 25 mg 1-1/2 tablet p.o. daily.  5. Simvastatin 40 mg 1 p.o. daily.  6. Hydrocodone 7.5/500 mg 1 p.o. b.i.d. p.r.n. pain.   PHYSICAL EXAMINATION:  Gross examination of the right hip demonstrates  the patient to have pain with internal rotation.  He has approximately 5  degrees of internal rotation, 25 degrees of external rotation, and 80  degrees of flexion.  He is neurovascularly intact.   X-rays demonstrate bone-on-bone degenerative joint disease of the right  hip.   PREOPERATIVE LABORATORY DATA:  White blood cells 6.2, red blood cells  4.67, hemoglobin 15.1, hematocrit 43.2, platelets 245.  PT 13.2, INR  1.0, PTT 29.  Sodium 141, potassium 4.1, chloride 109, glucose 104, BUN  12, creatinine 1.18.  Urinalysis was within normal limits.   HOSPITAL COURSE:  Mr. Proehl was admitted to [facility] on March 31, 2008, when he underwent a right total hip arthroplasty.  The  procedure was performed by Dr. Gean Birchwood and the patient tolerated it  well.  Perioperative Foley catheter was placed.  The patient was doing  well and was transferred to the orthopedic floor.  He was placed on  Lovenox and Coumadin for DVT prophylaxis.  On the first postoperative  day, the patient was awake and alert.  He complained of nausea and  vomiting.  Hemoglobin was 12 and he ambulated 60 feet with physical  therapy.  On the second postoperative day, the patient reported  improvement in his nausea and vomiting.  He was passing flatus.  Hemoglobin was 10.4.  Surgical dressing was changed and his incision was  benign.  On the third postoperative day, the patient was eating well and  ambulating independently.  He  had walked 250 feet with physical therapy  and he was discharged home.   DISPOSITION:  The patient was discharged home on April 03, 2008.  He was  weightbearing as tolerated and would return to the clinic to see Dr.  Turner Daniels for x-rays and staple removal in 7-10 days.  Home health would  manage his wound, Coumadin, and physical therapy.   Discharge medicines were as per the HMR with the addition of Percocet 5  mg 1-2 tablets p.o. q.4 h. p.r.n. pain and Coumadin to take as directed  with a target INR of 1.5 to 2.   FINAL DIAGNOSIS:  End-stage degenerative joint disease of the right hip.      Shirl Harris, PA      Feliberto Gottron. Turner Daniels, M.D.  Electronically Signed    JW/MEDQ  D:  05/05/2008  T:  05/06/2008  Job:  [id number]

## 2010-05-21 NOTE — Discharge Summary (Signed)
[place]. [facility]  Patient:    [name], [name]                     MRN: [id number] Adm. Date:  [id number] Disc. Date: 05/06/99 Attending:  Ronaldo Miyamoto Dictator:   Joellyn Rued, P.A.C. CC:         Thomas C. Wall, M.D. [facility]             Dr. Dewaine Conger, [place], [place]                           Discharge Summary  DATE OF BIRTH:  1945-11-07  ADMITTING PHYSICIANS:  Arturo Morton. Riley Kill, M.D. and Jesse Sans. Wall, M.D.  SUMMARY OF HISTORY:  [name] is a 65 year old white male who was transferred from Dr. Garnette Gunner office secondary to chest discomfort.  He describes a one-week onset of sharp, stabbing chest discomfort through to his back radiating to both shoulders.  It would occur while playing with his grandson and was associated with nausea and diaphoresis.  It would last only a few minutes and resolve completely.  The discomfort would return several hours later as pressure but not as severe.  He was seen at [facility] one evening and a stress test by Dr. Jonelle Sidle on April 29, 1999 was "okay"; however, the discomfort has persisted at low-level exercise since that time. He was seen by his primary M.D. on the day of admission and was referred here for cardiac catheterization.  PAST MEDICAL HISTORY:  Hypertension, hypothyroidism, arthritis, and remote tobacco use.  LABORATORY DATA:  CKs and troponins were negative for myocardial infarction. Sodium was 138, potassium 3.8, BUN 22, creatinine 1.2, glucose 115.  PT 13.0, PTT 28.  H&H 15.6 and 44.6, normal indices, platelets 276, WBC 7.7.  Fasting lipids showed a total cholesterol of 201, triglycerides 143, HDL 51, LDL 121, ratio 3.9.  Chest x-ray did not reveal any active disease.  EKG showed normal sinus rhythm, left axis deviation, nonspecific ST-T wave changes.  HOSPITAL COURSE:  The patient was admitted on May 05, 1999 and underwent cardiac catheterization on May 06, 1999 by Dr. Gerri Spore.   According to his progress note, this showed an ejection fraction of 60%, proximal 30% RCA, mid 30% RCA.  The LAD had a 20% mid lesion.  The circumflex had a 30% proximal, 25% mid, 50% mid, 75% followed by a 60% distal lesion.  The OM3 had a 75%. Dr. Gerri Spore angioplasty stented the OM3 without difficulty.  He remained on Integrilin for 18 hours postprocedure.  Sheath removal and bedrest were without complications, and by May 07, 1999 he was ambulating without difficulty and Dr. Daleen Squibb felt that he could be discharged home with a diagnosis of unstable angina, negative stress testing, catheterization as previously described status post angioplasty stenting of the OM3 with placement into the Cruise study, increased LDL, history of hypertension.  DISPOSITION:  He is discharged home.  DISCHARGE MEDICATIONS: 1. Plavix 75 mg q.d. for four weeks. 2. Zocor 20 mg q.h.s. 3. Enteric-coated aspirin 325 mg q.d. 4. Prinzide 20/25 q.d. 5. Levoxyl 0.125 q.d. 6. Vioxx 25 mg p.r.n. 7. Sublingual nitroglycerin p.r.n.  DISCHARGE INSTRUCTIONS:  He was advised no lifting, driving, sexual activity, or heavy exertion for two days.  He was given a note not to return to work for one week per Dr. Daleen Squibb.  Maintain low salt/fat/cholesterol diet.  If he had any problems  with his catheterization site, he was instructed to call immediately.  FOLLOW-UP:  He will see Dr. Daleen Squibb in the [place] office on May 19, 1999 at 2:45 p.m. DD:  05/07/99 TD:  05/07/99 Job: [id number]

## 2010-05-21 NOTE — Cardiovascular Report (Signed)
[place]. [facility]  Patient:    [name], [name] Visit Number: [id number] MRN: [id number]          Service Type: CAT Location: [facility] 2858 [id number] Attending Physician:  Daisey Must Dictated by:   Daisey Must, M.D. [facility] Proc. Date: 06/13/01 Admit Date:  06/13/2001 Discharge Date: 06/13/2001   CC:         Colon Flattery, D.O.  Thomas C. Wall, M.D. [facility]  Cardiac Catheterization Lab   Cardiac Catheterization  PROCEDURE: 1. Left heart catheterization with coronary angiography and left    ventriculography. 2. Intravascular ultrasound of the right coronary artery as part of the    ASTEROID protocol.  CARDIOLOGIST:  Daisey Must, M.D. [facility]  INDICATION:  [name] is a 65 year old male with a history of coronary artery disease.  He is status post stent placement in the third obtuse marginal branch approximately two years ago.  He has had recurrent substernal chest pain and was referred for cardiac catheterization.  PROCEDURAL NOTE:  A 6-French sheath was placed in the right femoral artery. Diagnostic catheterization was performed with standard Judkins 6-French catheters.  Contrast was Omnipaque.  Following completion of the diagnostic catheterization, we proceeded with intravascular ultrasound of the right coronary artery.  We used a 6-French JR4 guiding catheter with side holes and a high-torque floppy wire.  Heparin was administered to achieve an ACT greater than 200 seconds.  Intravascular ultrasound of the right coronary artery was performed with an Atlantis intravascular ultrasound catheter with images recorded on video tape.  Following completion of intravascular ultrasound, the ultrasound catheterization and guidewire removed, and final angiographic images revealed patency of the right coronary artery with no evidence of vessel damage.  At the conclusion of the procedure, a Duet vascular closure device was deployed over the right femoral  artery with good hemostasis.  There were no complications.  RESULTS:  HEMODYNAMICS:  Left ventricular pressure 130/15, aortic pressure 130/80. There was no aortic valve gradient.  LEFT VENTRICULOGRAM:  Wall motion is normal.  Ejection fraction calculated at 71%.  There is trace mitral regurgitation.  CORONARY ANGIOGRAPHY: (Right dominant).  Left main is normal.  Left anterior descending artery has minor luminal irregularity in the mid vessel.  The LAD gives rise to two small to normal size diagonal branches  The left circumflex has a tubular 50% stenosis in the mid vessel and a discrete 50% stenosis in the distal vessel.  The circumflex gives rise to a small OM-1, normal size OM-2, and a large OM-3.  OM-3 has a stent in the proximal vessel which is widely patent with 0% stenosis within the stent.  The right coronary artery is a dominant vessel.  There is a 20% stenosis in the proximal vessel followed by diffuse 40% stenosis in the mid vessel.  The distal right coronary artery gives rise to a normal size posterior descending artery and three small posterolateral branches.  INTRAVASCULAR ULTRASOUND:  Intravascular ultrasound of the mid and proximal right coronary artery revealed diffuse moderate but nonobstructive atherosclerotic disease.  IMPRESSIONS: 1. Normal left ventricular systolic function. 2. Mild to moderate coronary artery disease as described which does not    appear to be obstructive in nature.  There is a patent stent in the    obtuse marginal branch.  PLAN:  The patient will be managed medically. Dictated by:   Daisey Must, M.D. [facility] Attending Physician:  Daisey Must DD:  06/13/01 TD:  06/15/01 Job: [id number]

## 2010-05-21 NOTE — Cardiovascular Report (Signed)
NAMEVIRGIL, [name] NO.:  [id number]   MEDICAL RECORD NO.:  [id number]          PATIENT TYPE:  INP   LOCATION:  2022                         FACILITY:  [facility]   PHYSICIAN:  Arturo Morton. Riley Kill, M.D. [facility] OF BIRTH:  July 15, 1945   DATE OF PROCEDURE:  03/23/2005  DATE OF DISCHARGE:                              CARDIAC CATHETERIZATION   INDICATIONS:  [name] has previous percutaneous intervention. He had  ongoing chest pain for the last two weeks. He presented to the emergency  room and was brought to the cath lab for urgent evaluation.   PROCEDURES:  1.  Left heart catheterization.  2.  Selective coronary arteriogram.  3.  Selective left ventriculography.  4.  PTCA of the circumflex coronary artery.   DESCRIPTION OF PROCEDURE:  The patient was brought to the catheterization  laboratory and prepped and draped in the usual fashion. Through an anterior  puncture, the right femoral artery was easily entered. A 6-French sheath was  then placed. Views of left and right coronary arteries were obtained in  multiple angiographic projections. Central aortic and left ventricular  pressures were measured with a pigtail. Ventriculography was performed in  the RAO projection. The previously placed stent was widely patent but there  was a new high-grade proximal stenosis of the circumflex coronary artery. We  discussed the case with the patient and then subsequently with his family.  We recommended a percutaneous intervention. The patient was agreeable to  proceed. Preparations were made to put the patient on the CHAMPION protocol  using Cangrelor. The patient was then entered into the protocol. Heparin and  eptifibatide were used in appropriate doses. The ACT was checked and found  to be appropriate. We initially used a 3.5 Voda catheter, but the fit was  not ideal. We used a JL-4 guiding catheter and then a traverse wire.  Predilatation was done with a 2.5 mm balloon.  We then passed Taxus drug-  eluting stent into the lesion. This was 2.75 x 20 and this was taken up to  about 12 atmospheres. We then passed a 3.25 Quantum Maverick balloon into  the stent and post dilatation was done from the distal edge to the proximal  edge with pressures up to about 12 atmospheres. There was marked improvement  in appearance of the artery. No obvious evidence of edge tear.   All catheters were then subsequently removed and the femoral sheath was sewn  into place. He was taken to the holding area in satisfactory clinical  condition.   HEMODYNAMIC DATA:  1.  Central aortic pressure 96/65, mean 79.  2.  106/11.  3.  No gradient on pullback across the aortic valve.   ANGIOGRAPHIC DATA:  1.  Ventriculography done in the RAO projection reveals well-preserved      global systolic function.  2.  The left main is free of critical disease.  3.  The LAD has about 40% lesion in the mid-vessel. There is 40% area of      disease in the proximal diagonal.  4.  The circumflex has multiple small marginals. There is  a mid 90% hazy      stenosis. Following stenting with a 2.75 Taxus drug-eluting stent, the      stenosis was reduced from 90% to 0%. The vessel distally is patent.      There is about 30-40% in-stent restenosis in the previously placed stent      but not high-grade narrowing. This terminates distally.  5.  The right coronary is a large-caliber vessel with about 40% segmental      plaquing in the mid-vessel.   CONCLUSION:  1.  Preserved overall left ventricular function.  2.  High-grade stenosis of the proximal circumflex coronary artery with      successful percutaneous stenting.  3.  Segmental plaquing in the right coronary artery that is noncritical.   DISPOSITION:  The patient will need to remain on aspirin and Plavix. Long-  term follow up with Dr. Daleen Squibb was recommended.      Arturo Morton. Riley Kill, M.D. [facility]  Electronically Signed     TDS/MEDQ  D:   03/23/2005  T:  03/24/2005  Job:  [id number]   cc:   Thomas C. Wall, M.D.  [address]  [address]  [place]  [place] [address]   CV Laboratory   Patient's medical record

## 2010-05-21 NOTE — Assessment & Plan Note (Signed)
[facility]                            CARDIOLOGY OFFICE NOTE   Marsha, Hillman SKYLAR PRIEST                     MRN:          [id number]  DATE:04/12/2006                            DOB:          [birth year]    Mr. Kau returns to day for further management of the following  issues:  1. Coronary artery disease.  He is now over a year out from a drug-      eluting stent to the mid-circumflex.  He has had previous      interventions as outlined in the chart.  He is having no angina.      His stress Myoview October 18, 2005 showed an EF of 64% with no      ischemia or scar.  2. Hyperlipidemia.  3. Mild obesity.   Unfortunately, he has gained an additional 8 pounds, is down to 236.  He  had gotten down to 216.  He has no history of diabetes.  His hemoglobin  A1c a year ago was normal.   MEDICATIONS:  1. Aspirin 325 a day.  2. Hydrocodone/APAP p.r.n.  3. Metoprolol 12-1/2 of extended release q. day.  4. Lisinopril 10 mg a day.  5. Levothyroxine 175 mcg a day.  6. Plavix 75 mg a day.  7. Lipitor 80 mg q. day.   PHYSICAL EXAMINATION:  VITAL SIGNS:  His blood pressure today is 132/84,  his pulse 69 and regular.  His weight is 236.  HEENT:  He has got a ruddy complexion which is baseline, PERRLA,  extraocular movements intact, sclerae clear.  Facial symmetry is normal.  Dentition is satisfactory.  NECK:  Supple.  Carotid upstrokes are equal bilaterally without bruits,  no JVD.  LUNGS:  Clear.  HEART:  Reveals a poorly appreciated PMI.  Has normal S1, S2.  ABDOMEN:  Soft, good bowel sounds.  EXTREMITIES:  With no cyanosis, clubbing or edema.  Pulses are intact.  NEURO:  Exam is intact.   Electrocardiogram is normal, except for a left axis deviation.   ASSESSMENT/PLAN:  Mr. Macneal is doing well.  We can discontinue his  Plavix today.  He will stay on aspirin 325 a day.  I am going to get a  comprehensive metabolic panel, lipids and a TSH.  I will  see him back in  a year.    Thomas C. Daleen Squibb, MD, [facility]  Electronically Signed   TCW/MedQ  DD: 04/12/2006  DT: 04/12/2006  Job #: [id number]

## 2010-05-21 NOTE — Assessment & Plan Note (Signed)
[facility]                              CARDIOLOGY OFFICE NOTE   NAME:[name], BURTIS IMHOFF                     MRN:          [id number]  DATE:10/12/2005                            DOB:          [birth year]    Mr. Bias returns today for further management of his coronary artery  disease.  Please see my note from April 14, 2005.   He is now 6 months out from a drug-eluting stent to the mid circumflex.  He  is having no angina nor ischemic symptoms.  He is due a stress Myoview.   He remains on Plavix and aspirin.   His blood pressure has not been monitored as an outpatient but today is  120/76.  It had dropped in the past.  His pulse is 68 and regular.  His  weight is 228, up 12.   His carotid upstrokes are equal bilaterally without bruits, no JVD, thyroid  is not enlarged, trachea is midline.  Lungs are clear.  Heart reveals a soft  S1, S2.  Abdominal exam is soft with good bowel sounds, no midline bruit, no  hepatomegaly.  Extremities reveal no cyanosis, clubbing, or edema.  Pulses  are brisk.  Neuro exam is intact.   Looking back at blood work, his lipids were at goal on 80 of Lipitor.  His  hemoglobin A1c was normal at 5.6.  His comprehensive metabolic panel was  normal.  Labs dated May 18, 2005.   ASSESSMENT:  Mr. Keady is doing well.  I have made no changes in his  medical program.  We renewed all his prescriptions x90 days for 3 refills.   PLAN:  Exercise rest stress Myoview off of metoprolol.  If this is stable,  we will see him back again in a year.       Thomas C. Daleen Squibb, MD, [facility]     TCW/MedQ  DD:  10/12/2005  DT:  10/13/2005  Job #:  [id number]   cc:   Caryl Comes. Slotnick, M.D.

## 2010-05-21 NOTE — Discharge Summary (Signed)
[name], [name]              ACCOUNT NO.:  [id number]   MEDICAL RECORD NO.:  [id number]          PATIENT TYPE:  INP   LOCATION:  2022                         FACILITY:  [facility]   PHYSICIAN:  Gene Serpe, P.A. [facility]   DATE OF BIRTH:  1945-06-10   DATE OF ADMISSION:  03/23/2005  DATE OF DISCHARGE:  03/25/2005                                 DISCHARGE SUMMARY   PRINCIPAL DIAGNOSIS:  1.  Unstable angina pectoris.      1.  Status post Taxus stenting. High grade proximal circumflex artery          stenosis.      2.  Normal left ventricular function.   SECONDARY DIAGNOSES:  1.  Coronary artery disease.      1.  Status post stenting third obtuse marginal artery June 2001.      2.  Non-critical coronary artery disease/patent obtuse marginal;          residual 50% mid CFX by cardiac catheterization June 2003.      3.  Normal left ventricular function.  2.  Hypertension.  3.  Hyperlipidemia.  4.  Hypothyroidism.  5.  Arthritis.   PROCEDURE:  Coronary angiography - Taxus stenting, high grade proximal  circumflex artery stenosis by Dr. Shawnie Pons March 23, 2005.   REASON FOR ADMISSION:  [name] is a 65 year old male patient with known  coronary artery disease status post prior stenting of the third obtuse  marginal artery 2001, who was transferred directly from [facility]  ER, via [place], for further evaluation and management of symptoms,  worrisome for unstable angina pectoris.   HOSPITAL COURSE:  On presentation, the patient continued to report ongoing  chest pain, which he stated had been progressive over the last few weeks.  Initial cardiac markers were negative; however, initial electrocardiogram  did suggest subtle inferior T wave inversion. Arrangements were thus made  for patient to proceed directly to the catheterization lab. Dr. Riley Kill  proceeded with coronary angiography and performed successful Taxus stenting  of the high grade proximal circumflex  artery lesion with no noted  complications. Residual anatomy revealed non-obstructive coronary artery  disease. Left ventricular function was normal.   Postoperative course was benign. The patient was cleared to discharge on  hospital day 2, in hemodynamically stable condition.   MEDICATIONS:  Adjustments this admission include addition of Plavix, low  dose Toprol, and Lipitor. Down titration of Lisinopril and discontinuation  of hydrochlorothiazide.   LABORATORY DATA:  On discharge CBC normal. TSH pending. Cardiac enzymes:  Normal total CPK/MB. Peak troponin I 0.07. Liver enzymes normal with  decreased albumin of 3.3. Electrolytes and renal function normal. Glucose  156 on admission.   Admission chest x-ray:  NAD.   DISCHARGE MEDICATIONS:  1.  Plavix 75 mg daily (new).  2.  Coated aspirin 325 mg daily.  3.  Toprol 12.5 mg daily (new).  4.  Lipitor 80 mg daily.  5.  Lisinopril 10 mg daily.  6.  Levothyroxine 0.175 mg daily.  7.  Nitro-STAT 0.4 mg as directed.  8.  STOP taking hydrochlorothiazide.  INSTRUCTIONS:  Refer to cardiac catheterization discharge sheet.   FOLLOW UP:  Dr. Valera Castle on April 14, 2005 at 4:15 p.m.   DICTATION DURATION:  Less than 30 minutes.      Gene Serpe, P.A. [facility]     GS/MEDQ  D:  03/25/2005  T:  03/27/2005  Job:  [id number]   cc:   Caryl Comes. Slotnick, M.D.  Fax: [phone]   Jesse Sans. Wall, M.D.  [address]  [address]  [place]  [place] [address]

## 2010-05-21 NOTE — H&P (Signed)
NAME:  MEHAR, KIRKWOOD NO.:  [id number]   MEDICAL RECORD NO.:  [id number]          PATIENT TYPE:  INP   LOCATION:  2312                         FACILITY:  [facility]   PHYSICIAN:  Jonelle Sidle, M.D. LHCDATE OF BIRTH:  [birth year]   DATE OF ADMISSION:  03/23/2005  DATE OF DISCHARGE:                                HISTORY & PHYSICAL   PRIMARY CARDIOLOGIST:  Dr. Valera Castle   REASON FOR ADMISSION:  Mr. Ederer is a 65 year old male, with known  coronary artery disease status post previous percutaneous intervention of  the third obtuse marginal in 2001, who is now referred directly from [facility] ER, via [facility], for further evaluation and management of symptoms  worrisome for unstable angina pectoris.   The patient presents with a 2-3-week history of worsening angina pectoris  with transient episodes of severe, sharp midsternal chest pain reminiscent  of his prior presentation. He reports experiencing a constant pressure  sensation which is not resolved at all over these past few weeks. This is  exacerbated by exertion such as climbing up stairs or significant activity.  He also reports episodes of a sharp midsternal chest pain such as one that  awoke him at 3 o'clock this morning. This was one of his most severe  episodes (08/10) with radiation to the upper neck but not to the upper  extremities. There was some mild associated dyspnea and diaphoresis, but no  nausea/vomiting. He also denied any exacerbation with a deep inspiration or  movement. Of note, the patient has not taken nitroglycerin on his own over  these past few weeks.   The patient presented to the emergency room at [facility] earlier today and  was treated with sublingual nitroglycerin. However, he reports that this had  no effect on his chest pressure and that his sharp pain had resolved  spontaneously after approximately 10 minutes. The patient has been placed on  IV nitroglycerin and heparin  and is currently reporting residual chest  pressure (04/10).   Admission electrocardiogram suggestive of subtle inferior T-wave inversion  with no acute changes. Initial cardiac markers are normal as is a D-dimer.  Chest x-ray shows no acute changes and a BNP is normal.   ALLERGIES:  NO KNOWN DRUG ALLERGIES.   HOME MEDICATIONS:  1.  Aspirin 325 daily.  2.  Hydrochlorothiazide 25 daily.  3.  Levothyroxine 0.175 mg daily.  4.  Lisinopril 20 mg daily.  5.  Darvocet-N 100 p.r.n.   PAST MEDICAL HISTORY:  1.  Coronary artery disease.      1.  Status post stent third obtuse marginal artery June 2001.      2.  Noncritical CAD with patent obtuse marginal stent; residual 50% mid          CFX disease by cardiac catheterization in June 2003.      3.  Normal left ventricular function.  2.  Hypertension.  3.  History of hyperlipidemia.  4.  Hypothyroidism.  5.  Arthritis.  6.  Remote hemorrhoidectomy.   SOCIAL HISTORY:  The patient lives in [place] with his wife.  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 He has not smoked  tobacco in 30 years. Denies alcohol use.   FAMILY HISTORY:  Both parents are deceased. The father died of a heart  attack in his early 81s. Siblings have no known heart disease.   REVIEW OF SYSTEMS:  Significant for absence of any evidence of overt  bleeding over these past few weeks. The patient has occasional heartburn  symptoms and occasional dysphagia. He denies any orthopnea, paroxysmal  nocturnal dyspnea, or lower extremity edema. Remaining systems negative.   PHYSICAL EXAM:  VITAL SIGNS:  Blood pressure currently 106/74, pulse 70. The  patient is afebrile, respirations 20, sats 99% 2 liters.  GENERAL:  65 year old male in no apparent distress.  HEENT: Normocephalic, atraumatic.  NECK:  Preserved bilateral carotid pulses without bruits.  LUNGS:  Clear to auscultation in all fields.  HEART:  Regular rate and rhythm (S1-S2) with  diminished heart sounds.  ABDOMEN: Soft, nontender. Bowel sounds without bruits.  EXTREMITIES:  Palpable bilateral femoral and distal pulses without edema.  NEUROLOGY:  No focal deficit.   IMPRESSION:  Mr. Isensee is a 65 year old male, with known coronary artery  disease status post stenting of the third obtuse marginal artery in June  2001 with a widely patent stent site and moderate residual circumflex  disease by follow-up catheterization in June 2003, with normal left  ventricular function, who now presents to the emergency room, via [facility]  transfer from [facility] ER, with symptoms suggestive of crescendo angina  pectoris. Although initial cardiac markers are negative and EKG is  nonspecific, the patient does liken these symptoms to his previous  presentation.   PLAN:  The patient will be maintained here in the emergency room on  intravenous nitroglycerin and heparin while awaiting subsequent transfer to  the cardiac catheterization lab for coronary angiography. The patient is  agreeable with this plan and risks/benefits have been discussed. Given his  relative hypotension, we will defer from initiating any beta blocker at this  point (the patient has not received any thus far) but will attempt to  initiate this once his blood pressure normalizes. In the interim, we will  discontinue hydrochlorothiazide but continue lisinopril. The  patient will also be started on a statin. We will check a fasting lipid  profile in the morning, as well as a repeat electrocardiogram. We will also  cycle cardiac markers and check a TSH level. Of note, we will also start the  patient on a proton pump inhibitor.      Gene Serpe, P.A. [facility]    ______________________________  Jonelle Sidle, M.D. [facility]    GS/MEDQ  D:  03/23/2005  T:  03/24/2005  Job:  [id number]   cc:   Caryl Comes. Slotnick, M.D.  Fax: [phone]

## 2010-05-26 ENCOUNTER — Encounter: Payer: Self-pay | Admitting: Cardiology

## 2010-05-26 ENCOUNTER — Ambulatory Visit (INDEPENDENT_AMBULATORY_CARE_PROVIDER_SITE_OTHER): Payer: Medicare Other | Admitting: Cardiology

## 2010-05-26 VITALS — BP 124/84 | HR 57 | Resp 18 | Ht 72.0 in | Wt 244.0 lb

## 2010-05-26 DIAGNOSIS — I2510 Atherosclerotic heart disease of native coronary artery without angina pectoris: Secondary | ICD-10-CM

## 2010-05-26 NOTE — Patient Instructions (Signed)
Your physician recommends that you schedule a follow-up appointment in: 1 year with Dr. Wall  

## 2010-05-26 NOTE — Progress Notes (Signed)
   Patient ID: [name], male    DOB: Apr 10, 1945, 65 y.o.   MRN: [id number]  HPI [name] returns for E and M of his CAD. He denies any angina or change in his DOE. His blood work is being checked by his primary care.  EKG shows SB with a LAFB.    Review of Systems  All other systems reviewed and are negative.      Physical Exam  Nursing note and vitals reviewed. Constitutional: He is oriented to person, place, and time. He appears well-developed and well-nourished. No distress.       Ruddy complexion, obese.  HENT:  Head: Normocephalic and atraumatic.  Eyes: EOM are normal. Pupils are equal, round, and reactive to light.  Neck: Neck supple. No JVD present. No tracheal deviation present. No thyromegaly present.  Cardiovascular: Normal rate, regular rhythm, normal heart sounds and intact distal pulses.   No murmur heard. Pulmonary/Chest: Effort normal and breath sounds normal.  Abdominal: Soft. Bowel sounds are normal.  Musculoskeletal: Normal range of motion. He exhibits no edema.       Dependent rubor, decreased pulses  Neurological: He is alert and oriented to person, place, and time.  Skin: Skin is warm and dry.  Psychiatric: He has a normal mood and affect.

## 2010-05-26 NOTE — Assessment & Plan Note (Signed)
Stable, continue medical therapy. 

## 2011-08-31 ENCOUNTER — Encounter: Payer: Self-pay | Admitting: *Deleted

## 2011-09-06 ENCOUNTER — Ambulatory Visit (INDEPENDENT_AMBULATORY_CARE_PROVIDER_SITE_OTHER): Payer: Medicare Other | Admitting: Cardiology

## 2011-09-06 ENCOUNTER — Encounter: Payer: Self-pay | Admitting: Cardiology

## 2011-09-06 VITALS — BP 162/88 | HR 50 | Ht 72.0 in | Wt 240.0 lb

## 2011-09-06 DIAGNOSIS — R600 Localized edema: Secondary | ICD-10-CM

## 2011-09-06 DIAGNOSIS — I2510 Atherosclerotic heart disease of native coronary artery without angina pectoris: Secondary | ICD-10-CM

## 2011-09-06 DIAGNOSIS — R609 Edema, unspecified: Secondary | ICD-10-CM

## 2011-09-06 DIAGNOSIS — I10 Essential (primary) hypertension: Secondary | ICD-10-CM

## 2011-09-06 MED ORDER — LISINOPRIL-HYDROCHLOROTHIAZIDE 20-25 MG PO TABS: 1.0000 | ORAL_TABLET | Freq: Every day | ORAL | Status: DC

## 2011-09-06 NOTE — Patient Instructions (Addendum)
Your physician wants you to follow-up in: 1 YEAR WITH DR. Daleen Squibb. You will receive a reminder letter in the mail two months in advance. If you don't receive a letter, please call our office to schedule the follow-up appointment.   Your physician has recommended you make the following change in your medication: STOP LISINOPRIL START LISINOPRIL/HCTZ 20/25 MG 1 TABLET DAILY

## 2011-09-06 NOTE — Assessment & Plan Note (Signed)
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 We've changed him to lisinopril/HCTZ 20 mg/25 mg per day. If this does not control his blood pressure and or reduces edema, I would put him back on lisinopril and had a stronger diuretic like Lasix.

## 2011-09-06 NOTE — Assessment & Plan Note (Signed)
Not well-controlled. Goal less than 140 over less than 90. I've changed his lisinopril to lisinopril/HCTZ 20/25 mg daily. He'll keep a record of his pressures. His wife is a Engineer, civil (consulting). He will take these to his primary care physician the end of September. Hopefully this will help his edema as well.

## 2011-09-06 NOTE — Progress Notes (Signed)
HPI [name] returns today for evaluation and management his coronary artery disease. 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 He is having no angina or ischemic symptoms. His biggest complaint today is hand swelling and leg swelling in the day. They go down at night.  He has hypertension but does not check his blood pressure regular basis. When he arrived today he was in the 160s. I rechecked it it was in the mid 140s.  He has an appointment with his primary care physician on September 28. Blood work will be obtained then. Advised to get a thyroid panel being on thyroid replacement.  He denies orthopnea, PND.  Past Medical History  Diagnosis Date  . Coronary atherosclerosis of unspecified type of vessel, native or graft   . Unspecified essential hypertension   . Other and unspecified hyperlipidemia   . Unspecified sleep apnea   . Chronic airway obstruction, not elsewhere classified   . Arthropathy, unspecified, site unspecified   . Hemorrhage of gastrointestinal tract, unspecified     Current Outpatient Prescriptions  Medication Sig Dispense Refill  . aspirin 325 MG tablet Take 325 mg by mouth daily.        Marland Kitchen atenolol (TENORMIN) 25 MG tablet Take 25 mg by mouth daily.        Marland Kitchen HYDROcodone-acetaminophen (LORTAB) 7.5-500 MG per tablet Take 1 tablet by mouth every 6 (six) hours as needed.        Marland Kitchen levothyroxine (SYNTHROID, LEVOTHROID) 175 MCG tablet Take 150 mcg by mouth daily.       . nitroGLYCERIN (NITROSTAT) 0.4 MG SL tablet Place 0.4 mg under the tongue every 5 (five) minutes as needed.        . simvastatin (ZOCOR) 20 MG tablet Take 40 mg by mouth at bedtime.       Marland Kitchen lisinopril-hydrochlorothiazide (PRINZIDE,ZESTORETIC) 20-25 MG per tablet Take 1 tablet by mouth daily.  30 tablet  11    No Known Allergies  Family History  Problem Relation Age of Onset  . Colon cancer Neg Hx     History   Social History  . Marital Status: Married    Spouse Name: N/A   Number of Children: 2  . Years of Education: N/A   Occupational History  .  [employer]   Social History Main Topics  . Smoking status: Former Smoker    Quit date: 05/26/1975  . Smokeless tobacco: Not on file  . Alcohol Use: No  . Drug Use: No  . Sexually Active: Not on file   Other Topics Concern  . Not on file   Social History Narrative   Occupation:[place] [employer] workerMarried 2 children one boy,one girl.    ROS ALL NEGATIVE EXCEPT THOSE NOTED IN HPI  PE  General Appearance: well developed, well nourished in no acute distress HEENT: symmetrical face, PERRLA, good dentition  Neck: no JVD, thyromegaly, or adenopathy, trachea midline Chest: symmetric without deformity Cardiac: PMI non-displaced, RRR, normal S1, S2, no gallop or murmur Lung: clear to ausculation and percussion Vascular: all pulses full without bruits  Abdominal: nondistended, nontender, good bowel sounds, no HSM, no bruits Extremities: no cyanosis, clubbing 1+ edema, hands are tight,, no sign of DVT, no varicosities  Skin: normal color, no rashes Neuro: alert and oriented x 3, non-focal Pysch: normal affect  EKG Sinus bradycardia left axis deviation BMET    Component Value Date/Time   NA 136 08/27/2009 0435   K 4.2 08/27/2009 0435   CL 104  08/27/2009 0435   CO2 28 08/27/2009 0435   GLUCOSE 138* 08/27/2009 0435   BUN 11 08/27/2009 0435   CREATININE 1.20 08/27/2009 0435   CALCIUM 8.2* 08/27/2009 0435   GFRNONAA >60 08/27/2009 0435   GFRAA  Value: >60        The eGFR has been calculated using the MDRD equation. This calculation has not been validated in all clinical situations. eGFR's persistently <60 mL/min signify possible Chronic Kidney Disease. 08/27/2009 0435    Lipid Panel     Component Value Date/Time   CHOL  Value: 134        ATP III CLASSIFICATION:  <200     mg/dL   Desirable  161-096  mg/dL   Borderline High  >=045    mg/dL   High        4/0/9811 0158   TRIG 60 08/06/2008 0158   HDL 47 08/06/2008  0158   CHOLHDL 2.9 08/06/2008 0158   VLDL 12 08/06/2008 0158   LDLCALC  Value: 75        Total Cholesterol/HDL:CHD Risk Coronary Heart Disease Risk Table                     Men   Women  1/2 Average Risk   3.4   3.3  Average Risk       5.0   4.4  2 X Average Risk   9.6   7.1  3 X Average Risk  23.4   11.0        Use the calculated Patient Ratio above and the CHD Risk Table to determine the patient's CHD Risk.        ATP III CLASSIFICATION (LDL):  <100     mg/dL   Optimal  914-782  mg/dL   Near or Above                    Optimal  130-159  mg/dL   Borderline  956-213  mg/dL   High  >086     mg/dL   Very High 05/10/8467 6295    CBC    Component Value Date/Time   WBC 9.0 08/28/2009 0513   RBC 3.55* 08/28/2009 0513   HGB 10.9* 08/28/2009 0513   HCT 32.4* 08/28/2009 0513   PLT 162 08/28/2009 0513   MCV 91.3 08/28/2009 0513   MCH 30.7 08/28/2009 0513   MCHC 33.6 08/28/2009 0513   RDW 13.8 08/28/2009 0513   LYMPHSABS 2.8 08/20/2009 1441   MONOABS 0.7 08/20/2009 1441   EOSABS 0.1 08/20/2009 1441   BASOSABS 0.0 08/20/2009 1441

## 2011-09-06 NOTE — Assessment & Plan Note (Signed)
Stable. Continue secondary preventative therapy. 

## 2012-02-21 ENCOUNTER — Emergency Department (HOSPITAL_COMMUNITY): Payer: Medicare Other

## 2012-02-21 ENCOUNTER — Encounter (HOSPITAL_COMMUNITY): Payer: Self-pay | Admitting: *Deleted

## 2012-02-21 ENCOUNTER — Emergency Department (HOSPITAL_COMMUNITY)
Admission: EM | Admit: 2012-02-21 | Discharge: 2012-02-21 | Disposition: A | Discharge status: Home / Self Care | Payer: Medicare Other | Attending: Emergency Medicine | Admitting: Emergency Medicine

## 2012-02-21 DIAGNOSIS — M129 Arthropathy, unspecified: Secondary | ICD-10-CM | POA: Insufficient documentation

## 2012-02-21 DIAGNOSIS — J209 Acute bronchitis, unspecified: Secondary | ICD-10-CM | POA: Insufficient documentation

## 2012-02-21 DIAGNOSIS — E079 Disorder of thyroid, unspecified: Secondary | ICD-10-CM | POA: Insufficient documentation

## 2012-02-21 DIAGNOSIS — J40 Bronchitis, not specified as acute or chronic: Secondary | ICD-10-CM

## 2012-02-21 DIAGNOSIS — R209 Unspecified disturbances of skin sensation: Secondary | ICD-10-CM | POA: Insufficient documentation

## 2012-02-21 DIAGNOSIS — J449 Chronic obstructive pulmonary disease, unspecified: Secondary | ICD-10-CM | POA: Insufficient documentation

## 2012-02-21 DIAGNOSIS — R509 Fever, unspecified: Secondary | ICD-10-CM | POA: Insufficient documentation

## 2012-02-21 DIAGNOSIS — Z8719 Personal history of other diseases of the digestive system: Secondary | ICD-10-CM | POA: Insufficient documentation

## 2012-02-21 DIAGNOSIS — E785 Hyperlipidemia, unspecified: Secondary | ICD-10-CM | POA: Insufficient documentation

## 2012-02-21 DIAGNOSIS — I2510 Atherosclerotic heart disease of native coronary artery without angina pectoris: Secondary | ICD-10-CM | POA: Insufficient documentation

## 2012-02-21 DIAGNOSIS — Z87891 Personal history of nicotine dependence: Secondary | ICD-10-CM | POA: Insufficient documentation

## 2012-02-21 DIAGNOSIS — Z7982 Long term (current) use of aspirin: Secondary | ICD-10-CM | POA: Insufficient documentation

## 2012-02-21 DIAGNOSIS — Z79899 Other long term (current) drug therapy: Secondary | ICD-10-CM | POA: Insufficient documentation

## 2012-02-21 DIAGNOSIS — R5381 Other malaise: Secondary | ICD-10-CM | POA: Insufficient documentation

## 2012-02-21 DIAGNOSIS — J4489 Other specified chronic obstructive pulmonary disease: Secondary | ICD-10-CM | POA: Insufficient documentation

## 2012-02-21 DIAGNOSIS — I10 Essential (primary) hypertension: Secondary | ICD-10-CM | POA: Insufficient documentation

## 2012-02-21 HISTORY — DX: Disorder of thyroid, unspecified: E07.9

## 2012-02-21 LAB — CBC WITH DIFFERENTIAL/PLATELET
Basophils Relative: 0 % (ref 0–1)
Eosinophils Absolute: 0.1 10*3/uL (ref 0.0–0.7)
HCT: 43.8 % (ref 39.0–52.0)
Hemoglobin: 15.6 g/dL (ref 13.0–17.0)
MCH: 32.6 pg (ref 26.0–34.0)
MCHC: 35.6 g/dL (ref 30.0–36.0)
Monocytes Absolute: 1.5 10*3/uL — ABNORMAL HIGH (ref 0.1–1.0)
Monocytes Relative: 11 % (ref 3–12)
Neutrophils Relative %: 78 % — ABNORMAL HIGH (ref 43–77)
RDW: 13.3 % (ref 11.5–15.5)

## 2012-02-21 LAB — URINE MICROSCOPIC-ADD ON

## 2012-02-21 LAB — COMPREHENSIVE METABOLIC PANEL
Albumin: 3.8 g/dL (ref 3.5–5.2)
BUN: 19 mg/dL (ref 6–23)
Creatinine, Ser: 1.09 mg/dL (ref 0.50–1.35)
Total Protein: 7.1 g/dL (ref 6.0–8.3)

## 2012-02-21 LAB — URINALYSIS, ROUTINE W REFLEX MICROSCOPIC
Leukocytes, UA: NEGATIVE
Nitrite: NEGATIVE
Specific Gravity, Urine: 1.025 (ref 1.005–1.030)
pH: 5.5 (ref 5.0–8.0)

## 2012-02-21 LAB — TROPONIN I: Troponin I: <0.3 ng/mL (ref <0.30)

## 2012-02-21 MED ORDER — AZITHROMYCIN 250 MG PO TABS: 250.0000 mg | ORAL_TABLET | Freq: Every day | ORAL | Status: DC

## 2012-02-21 MED ORDER — AZITHROMYCIN 250 MG PO TABS
500.0000 mg | ORAL_TABLET | Freq: Once | ORAL | Status: AC
  Administered 2012-02-21: 500 mg via ORAL
  Filled 2012-02-21: qty 2

## 2012-02-21 NOTE — ED Notes (Signed)
Pt alert & oriented x4, stable gait. Patient  given discharge instructions, paperwork & prescription(s). Patient verbalized understanding. Pt left department w/ no further questions. 

## 2012-02-21 NOTE — ED Notes (Signed)
Pt states has been feeling bad since Saturday. Pt been running fever, shacking, complains of pain to the right leg. Pt denies any vomiting or diarrhea.

## 2012-02-21 NOTE — ED Provider Notes (Addendum)
History     CSN: [id number]  Arrival date & time 02/21/12  0301   First MD Initiated Contact with Patient 02/21/12 [phone]      Chief Complaint  Patient presents with  . Fever  . Numbness    (Consider location/radiation/quality/duration/timing/severity/associated sxs/prior treatment) HPI Comments: Patient with history of CAD with stents, COPD presents with complaints of "not feeling well" for the past two days.  He reports increased weakness, fatigue, and has felt fevered as well.  He denies cough, diarrhea, n/v, urinary complaints.  No chest pain or shortness of breath.  Patient is a 67 y.o. male presenting with fever. The history is provided by the patient.  Fever Temp source:  Subjective Severity:  Moderate Onset quality:  Gradual Duration:  2 days Timing:  Intermittent Chronicity:  New Relieved by:  Nothing Worsened by:  Nothing tried Ineffective treatments:  None tried Associated symptoms: chills   Associated symptoms: no chest pain   Associated symptoms comment:  Weakness, fatigue   Past Medical History  Diagnosis Date  . Coronary atherosclerosis of unspecified type of vessel, native or graft   . Unspecified essential hypertension   . Other and unspecified hyperlipidemia   . Unspecified sleep apnea   . Chronic airway obstruction, not elsewhere classified   . Arthropathy, unspecified, site unspecified   . Hemorrhage of gastrointestinal tract, unspecified   . Thyroid disease     Past Surgical History  Procedure Laterality Date  . Coronary stent placement    . Hip arthroplasty    . Ankle surgery    . Hemorrhoid surgery      Family History  Problem Relation Age of Onset  . Colon cancer Neg Hx     History  Substance Use Topics  . Smoking status: Former Smoker    Quit date: 05/26/1975  . Smokeless tobacco: Not on file  . Alcohol Use: No      Review of Systems  Constitutional: Positive for fever, chills and fatigue.  Cardiovascular: Negative for  chest pain.  All other systems reviewed and are negative.    Allergies  Review of patient's allergies indicates no known allergies.  Home Medications   Current Outpatient Rx  Name  Route  Sig  Dispense  Refill  . aspirin 325 MG tablet   Oral   Take 325 mg by mouth daily.           Marland Kitchen atenolol (TENORMIN) 25 MG tablet   Oral   Take 25 mg by mouth daily.           Marland Kitchen HYDROcodone-acetaminophen (LORTAB) 7.5-500 MG per tablet   Oral   Take 1 tablet by mouth every 6 (six) hours as needed.           Marland Kitchen levothyroxine (SYNTHROID, LEVOTHROID) 175 MCG tablet   Oral   Take 150 mcg by mouth daily.          Marland Kitchen lisinopril-hydrochlorothiazide (PRINZIDE,ZESTORETIC) 20-25 MG per tablet   Oral   Take 1 tablet by mouth daily.   30 tablet   11   . nitroGLYCERIN (NITROSTAT) 0.4 MG SL tablet   Sublingual   Place 0.4 mg under the tongue every 5 (five) minutes as needed.           . simvastatin (ZOCOR) 20 MG tablet   Oral   Take 40 mg by mouth at bedtime.            BP 143/76  Pulse 82  Temp(Src) 98.2 F (  36.8 C) (Oral)  Ht 6' (1.829 m)  Wt 240 lb (108.863 kg)  BMI 32.54 kg/m2  SpO2 91%  Physical Exam  Nursing note and vitals reviewed. Constitutional: He is oriented to person, place, and time. He appears well-developed and well-nourished. No distress.  HENT:  Head: Normocephalic and atraumatic.  Mouth/Throat: Oropharynx is clear and moist.  Eyes: EOM are normal. Pupils are equal, round, and reactive to light.  Neck: Normal range of motion. Neck supple.  Cardiovascular: Normal rate and regular rhythm.   No murmur heard. Pulmonary/Chest: Effort normal and breath sounds normal. No respiratory distress. He has no wheezes.  Abdominal: Soft. Bowel sounds are normal. He exhibits no distension. There is no tenderness.  Musculoskeletal: Normal range of motion. He exhibits no edema.  Neurological: He is alert and oriented to person, place, and time.  Skin: Skin is warm and dry.  He is not diaphoretic.    ED Course  Procedures (including critical care time)  Labs Reviewed  CBC WITH DIFFERENTIAL  COMPREHENSIVE METABOLIC PANEL  TROPONIN I  URINALYSIS, ROUTINE W REFLEX MICROSCOPIC   No results found.   No diagnosis found.   Date: 02/21/2012  Rate: 75  Rhythm: normal sinus rhythm  QRS Axis: left  Intervals: normal  ST/T Wave abnormalities: normal  Conduction Disutrbances:none  Narrative Interpretation:   Old EKG Reviewed: none available    MDM  The patient presents with a several week history of chest congestion, fever, chills, and weakness for the past two days.  There is nothing in the workup to suggest a cardiac etiology.  He has a mild wbc, but no other abnormality in the blood, urine.  The chest xray shows some streaking in the left base.  With the fever, pulmonary complaints, and finding on the chest xray, I will treat with zmax, return prn if he worsens.           Geoffery Lyons, MD 02/21/12 [id number]  Geoffery Lyons, MD 02/21/12 [phone]

## 2012-03-23 ENCOUNTER — Telehealth: Payer: Self-pay | Admitting: Family Medicine

## 2012-03-23 MED ORDER — HYDROCODONE-ACETAMINOPHEN 7.5-325 MG PO TABS: 1.0000 | ORAL_TABLET | Freq: Three times a day (TID) | ORAL | Status: DC | PRN

## 2012-03-23 NOTE — Telephone Encounter (Signed)
Needs refill on meds until appt with fpw

## 2012-03-23 NOTE — Telephone Encounter (Signed)
Pt wants refill on hydrocodone will pick up   Next appt is April 1

## 2012-03-23 NOTE — Telephone Encounter (Signed)
Pt aware rx written at front desk.

## 2012-04-03 ENCOUNTER — Ambulatory Visit: Payer: Self-pay | Admitting: Family Medicine

## 2012-04-16 ENCOUNTER — Encounter: Payer: Self-pay | Admitting: Family Medicine

## 2012-04-16 ENCOUNTER — Ambulatory Visit (INDEPENDENT_AMBULATORY_CARE_PROVIDER_SITE_OTHER): Payer: Medicare Other | Admitting: Family Medicine

## 2012-04-16 ENCOUNTER — Ambulatory Visit (INDEPENDENT_AMBULATORY_CARE_PROVIDER_SITE_OTHER): Payer: Medicare Other

## 2012-04-16 VITALS — BP 121/77 | HR 61 | Temp 97.7°F | Ht 71.0 in | Wt 248.4 lb

## 2012-04-16 DIAGNOSIS — E785 Hyperlipidemia, unspecified: Secondary | ICD-10-CM

## 2012-04-16 DIAGNOSIS — J4489 Other specified chronic obstructive pulmonary disease: Secondary | ICD-10-CM

## 2012-04-16 DIAGNOSIS — G4730 Sleep apnea, unspecified: Secondary | ICD-10-CM

## 2012-04-16 DIAGNOSIS — I10 Essential (primary) hypertension: Secondary | ICD-10-CM

## 2012-04-16 DIAGNOSIS — M129 Arthropathy, unspecified: Secondary | ICD-10-CM

## 2012-04-16 DIAGNOSIS — I2510 Atherosclerotic heart disease of native coronary artery without angina pectoris: Secondary | ICD-10-CM

## 2012-04-16 DIAGNOSIS — E039 Hypothyroidism, unspecified: Secondary | ICD-10-CM

## 2012-04-16 DIAGNOSIS — J449 Chronic obstructive pulmonary disease, unspecified: Secondary | ICD-10-CM

## 2012-04-16 NOTE — Patient Instructions (Signed)
Coronary Artery Disease, Risk Factors Research has shown that the risk of developing coronary artery disease (CAD) and having a heart attack increases with each factor you have. RISK FACTORS YOU CANNOT CHANGE  Your age. Your risk goes up as you get older. Most heart attacks happen to people over the age of 24.  Gender. Men have a greater risk of heart attack than women, and they have attacks earlier in life. However, women are more likely to die from a heart attack.  Heredity. Children of parents with heart disease are more likely to develop it themselves.  Race. African Americans and other ethnic groups have a higher risk, possibly because of high blood pressure, a tendency toward obesity, and diabetes.  Your family. Most people with a strong family history of heart disease have one or more other risk factors. RISK FACTORS YOU CAN CHANGE  Exposure to tobacco smoke. Even secondhand smoke greatly increases the risk for heart disease.  High blood cholesterol may be lowered with changes in diet, activity, and medicines.  High blood pressure makes the heart work harder. This causes the heart muscles to become thick and, eventually, weaker. It also increases your risk of stroke, heart attack, and kidney or heart failure.  Physical inactivity is a risk factor for CAD. Regular physical activity helps prevent heart and blood vessel disease. Exercise helps control blood cholesterol, diabetes, obesity, and it may help lower blood pressure in some people.  Excess body fat, especially belly fat, increases the risk of heart disease and stroke even if there are no other risk factors. Excess weight increases the heart's workload and raises blood pressure and blood cholesterol.  Diabetes seriously increases your risk of developing CAD. If you have diabetes, you should work with your caregiver to manage it and control other risk factors. OTHER RISK FACTORS FOR CAD  How you respond to stress.  Drinking  too much alcohol may raise blood pressure, cause heart failure, and lead to stroke.  Total cholesterol greater than 200 milligrams.  HDL (good) cholesterol less than 40 milligrams. HDL helps keep cholesterol from building up in the walls of the arteries. PREVENTING CAD  Maintain a healthy weight.  Exercise or do physical activity.  Eat a heart-healthy diet low in fat and salt and high in fiber.  Control your blood pressure to keep it below 120 over 80.  Keep your cholesterol at a level that lowers your risk.  Manage diabetes if you have it.  Stop smoking.  Learn how to manage stress. HEART SMART SUBSTITUTIONS  Instead of whole or 2% milk and cream, use skim milk.  Instead of fried foods, eat baked, steamed, boiled, broiled, or microwaved foods.  Instead of lard, butter, palm and coconut oils, cook with unsaturated vegetable oils, such as corn, olive, canola, safflower, sesame, soybean, sunflower, or peanut.  Instead of fatty cuts of meat, eat lean cuts of meat or cut off the fatty parts.  Instead of 1 whole egg in recipes, use 2 egg whites.  Instead of sauces, butter, and salt, season vegetables with herbs and spices.  Instead of regular hard and processed cheeses, eat low-fat, low-sodium cheeses.  Instead of salted potato chips, choose low-fat, unsalted tortilla and potato chips and unsalted pretzels and popcorn.  Instead of sour cream and mayonnaise, use plain low-fat yogurt, low-fat cottage cheese, or low-fat or "light" sour cream. FOR MORE INFORMATION  National Heart Lung and Blood Institute: http://www.ball-ray.net/ American Heart Association: weddingcandid.com Document Released: 03/12/2003 Document Revised: 03/14/2011  Document Reviewed: 03/07/2007 [facility] Patient Information 2013 [place], [place].

## 2012-04-16 NOTE — Progress Notes (Signed)
Patient ID: [name], male   DOB: 17-Feb-1945, 67 y.o.   MRN: [id number] SUBJECTIVE: HPI: Left knee arthritis, painfull flare up. Needs an injection in the left knee. Last time he received a steroid injection was several years ago.Requesting another injection. Never had an Xray of the knee. Worked on his knees for years. Other problems  Stable  as per assessment  PMH/PSH: reviewed/updated in Epic  SH/FH: reviewed/updated in Epic  Allergies: reviewed/updated in Epic  Medications: reviewed/updated in Epic  Immunizations: reviewed/updated in Epic  ROS: As above in the HPI. All other systems are stable or negative.  OBJECTIVE: APPEARANCE:  Patient in no acute distress.The patient appeared well nourished and normally developed. Acyanotic. Waist: obese. VITAL SIGNS:BP 121/77  Pulse 61  Temp(Src) 97.7 F (36.5 C) (Oral)  Ht 5\' 11"  (1.803 m)  Wt 248 lb 6.4 oz (112.674 kg)  BMI 34.66 kg/m2  SpO2 95%   SKIN: warm and  Dry without overt rashes, tattoos and scars  HEAD and Neck: without JVD, Head and scalp: normal Eyes:No scleral icterus. Fundi normal, eye movements normal. Ears: Auricle normal, canal normal, Tympanic membranes normal, insufflation normal. Nose: normal Throat: normal Neck & thyroid: normal  CHEST & LUNGS: Chest wall: normal Lungs: Clear  CVS: Reveals the PMI to be normally located. Regular rhythm, First and Second Heart sounds are normal,  absence of murmurs, rubs or gallops. Peripheral vasculature: Radial pulses: normal Dorsal pedis pulses: normal Posterior pulses: normal  ABDOMEN:  Appearance: normal Benign,, no organomegaly, no masses, no Abdominal Aortic enlargement. No Guarding , no rebound. No Bruits. Bowel sounds: normal  RECTAL: N/A GU: N/A  EXTREMETIES: nonedematous. Both Femoral and Pedal pulses are normal.  MUSCULOSKELETAL:  Spine: normal Joints: Left knee minimal soft tissue  Swelling. FROM. Pain on extremes of ROM.  Ligaments intact. Joint line tender. Walks with a limp.  NEUROLOGIC: oriented to time,place and person; nonfocal. Strength is normal Sensory is normal Reflexes are normal   ASSESSMENT: ARTHRITIS - Plan: DG Knee 1-2 Views Left  COPD  CORONARY ARTERY DISEASE  HYPERLIPIDEMIA - Plan: Hepatic function panel, NMR Lipoprofile with Lipids  HYPERTENSION - Plan: BASIC METABOLIC PANEL WITH GFR  SLEEP APNEA  Unspecified hypothyroidism - Plan: TSH  Not using CPAP and  Never did. No other complaints and problems  Stable except for left knee.  PLAN:  Orders Placed This Encounter  Procedures  . DG Knee 1-2 Views Left    Standing Status: Future     Number of Occurrences: 1     Standing Expiration Date: 06/16/2013    Order Specific Question:  Reason for Exam (SYMPTOM  OR DIAGNOSIS REQUIRED)    Answer:  left knee pain    Order Specific Question:  Preferred imaging location?    Answer:  Internal  . BASIC METABOLIC PANEL WITH GFR  . Hepatic function panel  . NMR Lipoprofile with Lipids  . TSH   No results found for this or any previous visit (from the past 24 hour(s)). No orders of the defined types were placed in this encounter.   Prodcedure. Left knee cleaned and draped, and 1/2 cc of K40 with marcaine and lidocaine , injected into the left knee with a medial approach. Ethyl chloride as L.A., excellent effect. No complications. Patient was appreciative. RTC in 3 months.  Continue meds. Await labs.  Khristie Sak P. [place] Charon, M.D.

## 2012-04-17 LAB — HEPATIC FUNCTION PANEL
ALT: 13 U/L (ref 0–53)
AST: 17 U/L (ref 0–37)
Albumin: 4.1 g/dL (ref 3.5–5.2)
Alkaline Phosphatase: 40 U/L (ref 39–117)
Bilirubin, Direct: 0.1 mg/dL (ref 0.0–0.3)
Indirect Bilirubin: 0.5 mg/dL (ref 0.0–0.9)
Total Bilirubin: 0.6 mg/dL (ref 0.3–1.2)
Total Protein: 6.6 g/dL (ref 6.0–8.3)

## 2012-04-17 LAB — BASIC METABOLIC PANEL WITH GFR
BUN: 18 mg/dL (ref 6–23)
CO2: 28 mEq/L (ref 19–32)
Calcium: 9.6 mg/dL (ref 8.4–10.5)
Chloride: 100 mEq/L (ref 96–112)
Creat: 1.27 mg/dL (ref 0.50–1.35)
GFR, Est African American: 67 mL/min
GFR, Est Non African American: 58 mL/min — ABNORMAL LOW
Glucose, Bld: 87 mg/dL (ref 70–99)
Potassium: 4.1 mEq/L (ref 3.5–5.3)
Sodium: 135 mEq/L (ref 135–145)

## 2012-04-17 LAB — TSH: TSH: 5.235 u[IU]/mL — ABNORMAL HIGH (ref 0.350–4.500)

## 2012-04-18 LAB — NMR LIPOPROFILE WITH LIPIDS
Cholesterol, Total: 160 mg/dL (ref <200)
HDL Particle Number: 38.3 umol/L (ref >=30.5)
HDL Size: 9 nm — ABNORMAL LOW (ref >=9.2)
HDL-C: 51 mg/dL (ref >=40)
LDL (calc): 76 mg/dL (ref <100)
LDL Particle Number: 1110 nmol/L — ABNORMAL HIGH (ref <1000)
LDL Size: 20.2 nm — ABNORMAL LOW (ref >20.5)
LP-IR Score: 62 — ABNORMAL HIGH (ref <=45)
Large HDL-P: 5.8 umol/L (ref >=4.8)
Large VLDL-P: 6.5 nmol/L — ABNORMAL HIGH (ref <=2.7)
Small LDL Particle Number: 689 nmol/L — ABNORMAL HIGH (ref <=527)
Triglycerides: 165 mg/dL — ABNORMAL HIGH (ref <150)
VLDL Size: 53.1 nm — ABNORMAL HIGH (ref <=46.6)

## 2012-04-20 NOTE — Progress Notes (Signed)
Quick Note:  Labs abnormal. Thyroid not at goal. We need to know his correct dose he is taking so we can increase it a little. The rest is close to or at goal. Thanks. ______

## 2012-04-23 ENCOUNTER — Other Ambulatory Visit: Payer: Self-pay | Admitting: Family Medicine

## 2012-04-23 ENCOUNTER — Telehealth: Payer: Self-pay

## 2012-04-23 DIAGNOSIS — M1710 Unilateral primary osteoarthritis, unspecified knee: Secondary | ICD-10-CM

## 2012-04-23 MED ORDER — HYDROCODONE-ACETAMINOPHEN 7.5-325 MG PO TABS: 1.0000 | ORAL_TABLET | Freq: Three times a day (TID) | ORAL | Status: DC | PRN

## 2012-04-23 MED ORDER — LEVOTHYROXINE SODIUM 175 MCG PO TABS: 175.0000 ug | ORAL_TABLET | Freq: Every day | ORAL | Status: DC

## 2012-04-23 NOTE — Telephone Encounter (Signed)
COMPLETED PT AWARE TO CK WITH [facility]

## 2012-04-23 NOTE — Telephone Encounter (Signed)
Pt requesting refill on hydrocodone to be filled at [facility]

## 2012-04-23 NOTE — Addendum Note (Signed)
Addended by: Pura Spice on: 04/23/2012 02:58 PM   Modules accepted: Orders

## 2012-04-23 NOTE — Telephone Encounter (Signed)
Pt requesting hydrocoodone 7.5mg  refill.  [facility]

## 2012-04-24 ENCOUNTER — Encounter: Payer: Self-pay | Admitting: Gastroenterology

## 2012-05-31 ENCOUNTER — Other Ambulatory Visit: Payer: Self-pay | Admitting: *Deleted

## 2012-05-31 DIAGNOSIS — M1710 Unilateral primary osteoarthritis, unspecified knee: Secondary | ICD-10-CM

## 2012-05-31 NOTE — Telephone Encounter (Signed)
LAST RF 04/23/12. LAST OV 04/16/12. PLEASE PRINT AND CALL PT WHEN READY TO PICKUP.

## 2012-05-31 NOTE — Telephone Encounter (Signed)
Last filled 04/23/12  Last seen 04/16/12  Print and have nurse call patient to pick up

## 2012-06-01 ENCOUNTER — Telehealth: Payer: Self-pay | Admitting: Family Medicine

## 2012-06-01 DIAGNOSIS — M1710 Unilateral primary osteoarthritis, unspecified knee: Secondary | ICD-10-CM

## 2012-06-01 NOTE — Telephone Encounter (Signed)
Last filled 04/23/12  Print and have nurse call patient to pick up

## 2012-06-04 ENCOUNTER — Other Ambulatory Visit: Payer: Self-pay | Admitting: Family Medicine

## 2012-06-04 DIAGNOSIS — M1710 Unilateral primary osteoarthritis, unspecified knee: Secondary | ICD-10-CM

## 2012-06-04 MED ORDER — HYDROCODONE-ACETAMINOPHEN 7.5-325 MG PO TABS: 1.0000 | ORAL_TABLET | Freq: Three times a day (TID) | ORAL | Status: DC | PRN

## 2012-06-04 NOTE — Telephone Encounter (Signed)
Already picked up rx

## 2012-06-04 NOTE — Telephone Encounter (Signed)
Pt notified to pick up rx.

## 2012-07-09 ENCOUNTER — Other Ambulatory Visit: Payer: Self-pay

## 2012-07-09 DIAGNOSIS — M1710 Unilateral primary osteoarthritis, unspecified knee: Secondary | ICD-10-CM

## 2012-07-09 NOTE — Telephone Encounter (Signed)
Seen by Dr. Mod[place] Charon 4/14  If approved print and have nurse call patient to pick up

## 2012-07-10 MED ORDER — HYDROCODONE-ACETAMINOPHEN 7.5-325 MG PO TABS: 1.0000 | ORAL_TABLET | Freq: Three times a day (TID) | ORAL | Status: DC | PRN

## 2012-07-12 ENCOUNTER — Other Ambulatory Visit: Payer: Self-pay | Admitting: Family Medicine

## 2012-07-14 ENCOUNTER — Other Ambulatory Visit: Payer: Self-pay | Admitting: Family Medicine

## 2012-07-18 ENCOUNTER — Other Ambulatory Visit: Payer: Self-pay

## 2012-07-18 MED ORDER — LEVOTHYROXINE SODIUM 175 MCG PO TABS: 175.0000 ug | ORAL_TABLET | Freq: Every day | ORAL | Status: DC

## 2012-07-19 ENCOUNTER — Ambulatory Visit: Payer: Medicare Other | Admitting: Family Medicine

## 2012-08-15 ENCOUNTER — Other Ambulatory Visit: Payer: Self-pay | Admitting: Family Medicine

## 2012-08-21 ENCOUNTER — Other Ambulatory Visit: Payer: Self-pay | Admitting: Family Medicine

## 2012-08-24 ENCOUNTER — Other Ambulatory Visit: Payer: Self-pay

## 2012-08-24 DIAGNOSIS — M1710 Unilateral primary osteoarthritis, unspecified knee: Secondary | ICD-10-CM

## 2012-08-24 NOTE — Telephone Encounter (Signed)
[facility] notified and they will inform pt to call for appt

## 2012-08-24 NOTE — Telephone Encounter (Signed)
Past due for office visit. Needs to be seen. Rx denied.

## 2012-08-24 NOTE — Telephone Encounter (Signed)
Last seen 04/16/12  [facility]  Last filled 07/09/12  If approved print and route to nurse

## 2012-08-28 ENCOUNTER — Telehealth: Payer: Self-pay | Admitting: Family Medicine

## 2012-08-28 NOTE — Telephone Encounter (Signed)
Pt notified of rx denied and pt has appt tomorrow

## 2012-08-29 ENCOUNTER — Ambulatory Visit (INDEPENDENT_AMBULATORY_CARE_PROVIDER_SITE_OTHER): Payer: Medicare Other | Admitting: Family Medicine

## 2012-08-29 ENCOUNTER — Encounter: Payer: Self-pay | Admitting: Family Medicine

## 2012-08-29 VITALS — BP 117/72 | HR 59 | Temp 97.0°F | Ht 72.0 in | Wt 247.8 lb

## 2012-08-29 DIAGNOSIS — K219 Gastro-esophageal reflux disease without esophagitis: Secondary | ICD-10-CM

## 2012-08-29 DIAGNOSIS — E079 Disorder of thyroid, unspecified: Secondary | ICD-10-CM | POA: Insufficient documentation

## 2012-08-29 DIAGNOSIS — J4489 Other specified chronic obstructive pulmonary disease: Secondary | ICD-10-CM

## 2012-08-29 DIAGNOSIS — I2510 Atherosclerotic heart disease of native coronary artery without angina pectoris: Secondary | ICD-10-CM | POA: Insufficient documentation

## 2012-08-29 DIAGNOSIS — G4730 Sleep apnea, unspecified: Secondary | ICD-10-CM

## 2012-08-29 DIAGNOSIS — E785 Hyperlipidemia, unspecified: Secondary | ICD-10-CM

## 2012-08-29 DIAGNOSIS — J449 Chronic obstructive pulmonary disease, unspecified: Secondary | ICD-10-CM

## 2012-08-29 DIAGNOSIS — M1710 Unilateral primary osteoarthritis, unspecified knee: Secondary | ICD-10-CM

## 2012-08-29 DIAGNOSIS — M129 Arthropathy, unspecified: Secondary | ICD-10-CM

## 2012-08-29 DIAGNOSIS — I10 Essential (primary) hypertension: Secondary | ICD-10-CM

## 2012-08-29 MED ORDER — LEVOTHYROXINE SODIUM 175 MCG PO TABS: 175.0000 ug | ORAL_TABLET | Freq: Every day | ORAL | Status: DC

## 2012-08-29 MED ORDER — HYDROCODONE-ACETAMINOPHEN 7.5-325 MG PO TABS: 1.0000 | ORAL_TABLET | Freq: Three times a day (TID) | ORAL | Status: DC | PRN

## 2012-08-29 MED ORDER — SIMVASTATIN 40 MG PO TABS: ORAL_TABLET | ORAL | Status: DC

## 2012-08-29 MED ORDER — ATENOLOL 50 MG PO TABS: ORAL_TABLET | ORAL | Status: DC

## 2012-08-29 MED ORDER — LISINOPRIL-HYDROCHLOROTHIAZIDE 20-25 MG PO TABS: 1.0000 | ORAL_TABLET | Freq: Every day | ORAL | Status: DC

## 2012-08-29 NOTE — Progress Notes (Signed)
Patient ID: [name], male   DOB: [birth year], 67 y.o.   MRN: [id number] SUBJECTIVE: CC: Chief Complaint  Patient presents with  . Medication Refill    all meds     HPI: Breakfast: egg, sausage and biscuit Lunch:ham sandwich with cheese Supper: salad, flounder fired in a sandwich.  Patient is here for follow up of hyperlipidemia/Hypertension /copd/GERD denies Headache;denies Chest Pain;denies weakness;denies Shortness of Breath and orthopnea;denies Visual changes;denies palpitations;denies cough;denies pedal edema;denies symptoms of TIA or stroke;deniesClaudication symptoms. admits to Compliance with medications; denies Problems with medications.  Past Medical History  Diagnosis Date  . Coronary atherosclerosis of unspecified type of vessel, native or graft   . Unspecified essential hypertension   . Other and unspecified hyperlipidemia   . Unspecified sleep apnea   . Chronic airway obstruction, not elsewhere classified   . Arthropathy, unspecified, site unspecified   . Hemorrhage of gastrointestinal tract, unspecified   . Thyroid disease     hypothyroid   Past Surgical History  Procedure Laterality Date  . Coronary stent placement    . Hip arthroplasty    . Ankle surgery    . Hemorrhoid surgery     History   Social History  . Marital Status: Married    Spouse Name: N/A    Number of Children: 2  . Years of Education: N/A   Occupational History  .  [employer]   Social History Main Topics  . Smoking status: Former Smoker    Quit date: 05/26/1975  . Smokeless tobacco: Not on file  . Alcohol Use: No  . Drug Use: No  . Sexual Activity: Not on file   Other Topics Concern  . Not on file   Social History Narrative   Occupation:[employer]ologist   Married 2 children one boy,one girl.   Family History  Problem Relation Age of Onset  . Colon cancer Neg Hx    Current Outpatient Prescriptions on File Prior to Visit  Medication Sig Dispense Refill   . aspirin 325 MG tablet Take 325 mg by mouth daily.        Marland Kitchen atenolol (TENORMIN) 50 MG tablet TAKE ONE TABLET BY MOUTH ONE TIME DAILY  90 tablet  0  . HYDROcodone-acetaminophen (NORCO) 7.5-325 MG per tablet Take 1 tablet by mouth 3 (three) times daily as needed for pain.  90 tablet  0  . levothyroxine (SYNTHROID, LEVOTHROID) 175 MCG tablet Take 1 tablet (175 mcg total) by mouth daily.  30 tablet  8  . lisinopril-hydrochlorothiazide (PRINZIDE,ZESTORETIC) 20-25 MG per tablet Take 1 tablet by mouth daily.  30 tablet  11  . simvastatin (ZOCOR) 40 MG tablet TAKE ONE TABLET BY MOUTH AT BEDTIME  90 tablet  0  . nitroGLYCERIN (NITROSTAT) 0.4 MG SL tablet Place 0.4 mg under the tongue every 5 (five) minutes as needed.         No current facility-administered medications on file prior to visit.   No Known Allergies  There is no immunization history on file for this patient. Prior to Admission medications   Medication Sig Start Date End Date Taking? Authorizing Provider  aspirin 325 MG tablet Take 325 mg by mouth daily.     Yes Historical Provider, MD  atenolol (TENORMIN) 50 MG tablet TAKE ONE TABLET BY MOUTH ONE TIME DAILY 08/15/12  Yes Ileana Ladd, MD  HYDROcodone-acetaminophen (NORCO) 7.5-325 MG per tablet Take 1 tablet by mouth 3 (three) times daily as needed for pain. 07/09/12  Yes Thelma Barge  Casimiro Needle, MD  levothyroxine (SYNTHROID, LEVOTHROID) 175 MCG tablet Take 1 tablet (175 mcg total) by mouth daily. 07/18/12  Yes Ernestina Penna, MD  lisinopril-hydrochlorothiazide (PRINZIDE,ZESTORETIC) 20-25 MG per tablet Take 1 tablet by mouth daily. 09/06/11 09/05/12 Yes Gaylord Shih, MD  simvastatin (ZOCOR) 40 MG tablet TAKE ONE TABLET BY MOUTH AT BEDTIME 07/14/12  Yes Ileana Ladd, MD  nitroGLYCERIN (NITROSTAT) 0.4 MG SL tablet Place 0.4 mg under the tongue every 5 (five) minutes as needed.      Historical Provider, MD     ROS: As above in the HPI. All other systems are stable or  negative.  OBJECTIVE: APPEARANCE:  Patient in no acute distress.The patient appeared well nourished and normally developed. Acyanotic. Waist:44.5 inches VITAL SIGNS:BP 117/72  Pulse 59  Temp(Src) 97 F (36.1 C) (Oral)  Ht 6' (1.829 m)  Wt 247 lb 12.8 oz (112.401 kg)  BMI 33.6 kg/m2  WM obese SKIN: warm and  Dry without overt rashes, tattoos and scars  HEAD and Neck: without JVD, Head and scalp: normal Eyes:No scleral icterus. Fundi normal, eye movements normal. Ears: Auricle normal, canal normal, Tympanic membranes normal, insufflation normal. Nose: normal Throat: normal Neck & thyroid: normal  CHEST & LUNGS: Chest wall: normal Lungs: Clear  CVS: Reveals the PMI to be normally located. Regular rhythm, First and Second Heart sounds are normal,  absence of murmurs, rubs or gallops. Peripheral vasculature: Radial pulses: normal Dorsal pedis pulses: normal Posterior pulses: normal  ABDOMEN:  Appearance:obese Benign, no organomegaly, no masses, no Abdominal Aortic enlargement. No Guarding , no rebound. No Bruits. Bowel sounds: normal  RECTAL: N/A GU: N/A  EXTREMETIES: nonedematous.  MUSCULOSKELETAL:  Spine: normal Joints: knees painful on ROM with crepitus  NEUROLOGIC: oriented to time,place and person; nonfocal. Strength is normal Sensory is normal Reflexes are normal Cranial Nerves are normal.  ASSESSMENT: HYPERLIPIDEMIA - Plan: simvastatin (ZOCOR) 40 MG tablet, CMP14+EGFR, NMR, lipoprofile  HYPERTENSION - Plan: atenolol (TENORMIN) 50 MG tablet, CMP14+EGFR  Coronary atherosclerosis of unspecified type of vessel, native or graft  COPD  SLEEP APNEA  ARTHRITIS  GERD  Arthritis of knee - Plan: HYDROcodone-acetaminophen (NORCO) 7.5-325 MG per tablet  Thyroid disease - Plan: levothyroxine (SYNTHROID, LEVOTHROID) 175 MCG tablet, TSH  Unspecified essential hypertension - Plan: lisinopril-hydrochlorothiazide (PRINZIDE,ZESTORETIC) 20-25 MG per  tablet   PLAN:       Dr Woodroe Mode Recommendations  For nutrition information, I recommend books:  1).Eat to Live by Dr Monico Hoar. 2).Prevent and Reverse Heart Disease by Dr Suzzette Righter. 3) Dr Katherina Right Book:  Program to Reverse Diabetes  Exercise recommendations are:  If unable to walk, then the patient can exercise in a chair 3 times a day. By flapping arms like a bird gently and raising legs outwards to the front.  If ambulatory, the patient can go for walks for 30 minutes 3 times a week. Then increase the intensity and duration as tolerated.  Goal is to try to attain exercise frequency to 5 times a week.  If applicable: Best to perform resistance exercises (machines or weights) 2 days a week and cardio type exercises 3 days per week.  Orders Placed This Encounter  Procedures  . CMP14+EGFR  . NMR, lipoprofile  . TSH    Meds ordered this encounter  Medications  . atenolol (TENORMIN) 50 MG tablet    Sig: TAKE ONE TABLET BY MOUTH ONE TIME DAILY    Dispense:  90 tablet    Refill:  3  . HYDROcodone-acetaminophen (NORCO) 7.5-325 MG per tablet    Sig: Take 1 tablet by mouth 3 (three) times daily as needed for pain.    Dispense:  90 tablet    Refill:  0    Pt picked up rx  . simvastatin (ZOCOR) 40 MG tablet    Sig: TAKE ONE TABLET BY MOUTH AT BEDTIME    Dispense:  90 tablet    Refill:  3  . levothyroxine (SYNTHROID, LEVOTHROID) 175 MCG tablet    Sig: Take 1 tablet (175 mcg total) by mouth daily.    Dispense:  30 tablet    Refill:  11  . lisinopril-hydrochlorothiazide (PRINZIDE,ZESTORETIC) 20-25 MG per tablet    Sig: Take 1 tablet by mouth daily.    Dispense:  30 tablet    Refill:  11    Return in about 3 months (around 11/29/2012) for Recheck medical problems.  Hughie Melroy P. Modesto Charon, M.D.

## 2012-08-29 NOTE — Patient Instructions (Addendum)
      Dr Cara Aguino's Recommendations  For nutrition information, I recommend books:  1).Eat to Live by Dr Joel Fuhrman. 2).Prevent and Reverse Heart Disease by Dr Caldwell Esselstyn. 3) Dr Neal Barnard's Book:  Program to Reverse Diabetes  Exercise recommendations are:  If unable to walk, then the patient can exercise in a chair 3 times a day. By flapping arms like a bird gently and raising legs outwards to the front.  If ambulatory, the patient can go for walks for 30 minutes 3 times a week. Then increase the intensity and duration as tolerated.  Goal is to try to attain exercise frequency to 5 times a week.  If applicable: Best to perform resistance exercises (machines or weights) 2 days a week and cardio type exercises 3 days per week.  

## 2012-08-30 LAB — NMR, LIPOPROFILE
Cholesterol: 151 mg/dL (ref <200)
HDL Cholesterol by NMR: 48 mg/dL (ref >=40)
HDL Particle Number: 36.9 umol/L (ref >=30.5)
LDL Particle Number: 854 nmol/L (ref <1000)
LDL Size: 20.6 nm (ref >20.5)
LDLC SERPL CALC-MCNC: 71 mg/dL (ref <100)
LP-IR Score: 30 (ref <=45)
Small LDL Particle Number: 260 nmol/L (ref <=527)
Triglycerides by NMR: 160 mg/dL — ABNORMAL HIGH (ref <150)

## 2012-08-30 LAB — CMP14+EGFR
ALT: 16 IU/L (ref 0–44)
AST: 18 IU/L (ref 0–40)
Albumin/Globulin Ratio: 2.2 (ref 1.1–2.5)
Albumin: 4.2 g/dL (ref 3.6–4.8)
Alkaline Phosphatase: 48 IU/L (ref 39–117)
BUN/Creatinine Ratio: 15 (ref 10–22)
BUN: 19 mg/dL (ref 8–27)
CO2: 23 mmol/L (ref 18–29)
Calcium: 9.6 mg/dL (ref 8.6–10.2)
Chloride: 101 mmol/L (ref 97–108)
Creatinine, Ser: 1.23 mg/dL (ref 0.76–1.27)
GFR calc Af Amer: 70 mL/min/{1.73_m2} (ref >59)
GFR calc non Af Amer: 60 mL/min/{1.73_m2} (ref >59)
Globulin, Total: 1.9 g/dL (ref 1.5–4.5)
Glucose: 106 mg/dL — ABNORMAL HIGH (ref 65–99)
Potassium: 4.7 mmol/L (ref 3.5–5.2)
Sodium: 140 mmol/L (ref 134–144)
Total Bilirubin: 0.3 mg/dL (ref 0.0–1.2)
Total Protein: 6.1 g/dL (ref 6.0–8.5)

## 2012-08-30 LAB — TSH: TSH: 1.95 u[IU]/mL (ref 0.450–4.500)

## 2012-09-06 ENCOUNTER — Telehealth: Payer: Self-pay | Admitting: Family Medicine

## 2012-09-06 NOTE — Telephone Encounter (Signed)
Patient aware.

## 2012-10-05 ENCOUNTER — Other Ambulatory Visit: Payer: Self-pay | Admitting: Family Medicine

## 2012-10-05 DIAGNOSIS — M1710 Unilateral primary osteoarthritis, unspecified knee: Secondary | ICD-10-CM

## 2012-10-05 MED ORDER — HYDROCODONE-ACETAMINOPHEN 7.5-325 MG PO TABS: 1.0000 | ORAL_TABLET | Freq: Three times a day (TID) | ORAL | Status: DC | PRN

## 2012-10-05 NOTE — Telephone Encounter (Signed)
Last filled 09/01/12, last seen 08/29/12. Rx will print, have nurse call pt to pickup

## 2012-10-05 NOTE — Telephone Encounter (Signed)
Rx ready for pick up. 

## 2012-10-06 NOTE — Telephone Encounter (Signed)
Left message on pt voice mail that rx for hydrocodone at front desk

## 2012-10-24 ENCOUNTER — Encounter: Payer: Self-pay | Admitting: Cardiology

## 2012-10-24 ENCOUNTER — Ambulatory Visit (INDEPENDENT_AMBULATORY_CARE_PROVIDER_SITE_OTHER): Payer: Medicare Other | Admitting: Cardiology

## 2012-10-24 VITALS — BP 140/70 | HR 67 | Ht 72.0 in | Wt 251.0 lb

## 2012-10-24 DIAGNOSIS — I10 Essential (primary) hypertension: Secondary | ICD-10-CM

## 2012-10-24 DIAGNOSIS — E785 Hyperlipidemia, unspecified: Secondary | ICD-10-CM

## 2012-10-24 DIAGNOSIS — I2510 Atherosclerotic heart disease of native coronary artery without angina pectoris: Secondary | ICD-10-CM

## 2012-10-24 NOTE — Patient Instructions (Signed)
Continue your current therapy  We will schedule you for a Lexiscan myoview study  I will see you in one year

## 2012-10-24 NOTE — Progress Notes (Signed)
[name] Date of Birth: 1945/12/24 Medical Record #[id number]  History of Present Illness: [name] is seen to establish cardiac care. He is a former patient of Dr. Daleen Squibb. He has a history of coronary disease. His initial cardiac event was in 2001. He had stenting of the third obtuse marginal vessel with a bare-metal stent. He had repeat cardiac catheterization in 2003 including intravascular ultrasound with no obstructive disease. In 2007 he presented with recurrent angina and had stenting of the proximal left circumflex with a Taxus stent. He thinks he had 2 stents placed at that time but the procedure note only indicates 1 stent. In 2010 he had another cardiac catheterization. This demonstrated moderate disease in the RCA and 70%. His stents were still patent. He was treated medically. He does report that his prior stent procedures were always preceded by severe chest pain. He denies any recent chest pain. He does note some increased shortness of breath on exertion. He is limited by arthritic knees. He has a history of hyperlipidemia, hypertension, and family history of early coronary disease.  Current Outpatient Prescriptions on File Prior to Visit  Medication Sig Dispense Refill  . aspirin 325 MG tablet Take 325 mg by mouth daily.        Marland Kitchen atenolol (TENORMIN) 50 MG tablet TAKE ONE TABLET BY MOUTH ONE TIME DAILY  90 tablet  3  . HYDROcodone-acetaminophen (NORCO) 7.5-325 MG per tablet Take 1 tablet by mouth 3 (three) times daily as needed for pain.  90 tablet  0  . levothyroxine (SYNTHROID, LEVOTHROID) 175 MCG tablet Take 1 tablet (175 mcg total) by mouth daily.  30 tablet  11  . lisinopril-hydrochlorothiazide (PRINZIDE,ZESTORETIC) 20-25 MG per tablet Take 1 tablet by mouth daily.  30 tablet  11  . nitroGLYCERIN (NITROSTAT) 0.4 MG SL tablet Place 0.4 mg under the tongue every 5 (five) minutes as needed.        . simvastatin (ZOCOR) 40 MG tablet TAKE ONE TABLET BY MOUTH AT BEDTIME  90  tablet  3   No current facility-administered medications on file prior to visit.    No Known Allergies  Past Medical History  Diagnosis Date  . Coronary atherosclerosis of unspecified type of vessel, native or graft   . Unspecified essential hypertension   . Other and unspecified hyperlipidemia   . Unspecified sleep apnea   . Chronic airway obstruction, not elsewhere classified   . Arthropathy, unspecified, site unspecified   . Hemorrhage of gastrointestinal tract, unspecified   . Thyroid disease     hypothyroid    Past Surgical History  Procedure Laterality Date  . Coronary stent placement    . Hip arthroplasty    . Ankle surgery    . Hemorrhoid surgery      History  Smoking status  . Former Smoker  . Quit date: 05/26/1975  Smokeless tobacco  . Not on file    History  Alcohol Use No    Family History  Problem Relation Age of Onset  . Colon cancer Neg Hx     Review of Systems: The review of systems is positive for severe arthritis in his knees.  All other systems were reviewed and are negative.  Physical Exam: BP 140/70  Pulse 67  Ht 6' (1.829 m)  Wt 251 lb (113.853 kg)  BMI 34.03 kg/m2  SpO2 94% He is a pleasant white male in no acute distress. HEENT: Normal. Neck: No JVD, adenopathy, thyromegaly, or bruits. Lungs: Clear Cardiovascular:  Regular rate and rhythm. Normal S1 and S2. No gallop, murmur, or click. Abdomen: Soft and nontender. No masses or bruits. Bowel sounds are positive. Extremities: No cyanosis or edema. Pulses are 2+ and symmetric. Skin: Warm and dry Neuro: Alert and oriented x3. Cranial nerves II through XII are intact. LABORATORY DATA: Lab Results  Component Value Date   WBC 13.6* 02/21/2012   HGB 15.6 02/21/2012   HCT 43.8 02/21/2012   PLT 196 02/21/2012   GLUCOSE 106* 08/29/2012   CHOL 151 08/29/2012   TRIG 165* 04/16/2012   HDL 47 08/06/2008   LDLDIRECT 70.0 04/12/2006   LDLCALC 76 04/16/2012   ALT 16 08/29/2012   AST 18 08/29/2012     NA 140 08/29/2012   K 4.7 08/29/2012   CL 101 08/29/2012   CREATININE 1.23 08/29/2012   BUN 19 08/29/2012   CO2 23 08/29/2012   TSH 1.950 08/29/2012   INR 1.29 08/28/2009   HGBA1C  Value: 5.8 (NOTE) The ADA recommends the following therapeutic goal for glycemic control related to Hgb A1c measurement: Goal of therapy: <6.5 Hgb A1c  Reference: American Diabetes Association: Clinical Practice Recommendations 2010, Diabetes Care, 2010, 33: (Suppl  1). 08/06/2008     Assessment / Plan: 1. Coronary disease status post stenting of the third obtuse marginal vessel and 2003 with a bare-metal stent. Status post stenting of the proximal left circumflex in 2007 with a Taxus DES. Currently without significant anginal symptoms. He does experience some dyspnea on exertion. He has had no ischemic evaluation since 2010. I recommended a LexiScan Myoview study. Continue therapy with aspirin and atenolol.  2. Hyperlipidemia. Continue statin therapy. Last lipid panel was satisfactory.  3. Hypertension-controlled. Continue lisinopril and atenolol.

## 2012-11-07 ENCOUNTER — Encounter: Payer: Self-pay | Admitting: Cardiology

## 2012-11-13 ENCOUNTER — Ambulatory Visit (HOSPITAL_COMMUNITY): Payer: Medicare Other | Attending: Cardiology | Admitting: Radiology

## 2012-11-13 ENCOUNTER — Encounter: Payer: Self-pay | Admitting: Cardiology

## 2012-11-13 VITALS — BP 113/73 | Ht 72.0 in | Wt 248.0 lb

## 2012-11-13 DIAGNOSIS — Z8249 Family history of ischemic heart disease and other diseases of the circulatory system: Secondary | ICD-10-CM | POA: Insufficient documentation

## 2012-11-13 DIAGNOSIS — E785 Hyperlipidemia, unspecified: Secondary | ICD-10-CM | POA: Insufficient documentation

## 2012-11-13 DIAGNOSIS — R0989 Other specified symptoms and signs involving the circulatory and respiratory systems: Secondary | ICD-10-CM | POA: Insufficient documentation

## 2012-11-13 DIAGNOSIS — Z87891 Personal history of nicotine dependence: Secondary | ICD-10-CM | POA: Insufficient documentation

## 2012-11-13 DIAGNOSIS — I2510 Atherosclerotic heart disease of native coronary artery without angina pectoris: Secondary | ICD-10-CM

## 2012-11-13 DIAGNOSIS — R0609 Other forms of dyspnea: Secondary | ICD-10-CM | POA: Insufficient documentation

## 2012-11-13 DIAGNOSIS — I10 Essential (primary) hypertension: Secondary | ICD-10-CM

## 2012-11-13 DIAGNOSIS — R0602 Shortness of breath: Secondary | ICD-10-CM | POA: Insufficient documentation

## 2012-11-13 MED ORDER — TECHNETIUM TC 99M SESTAMIBI GENERIC - CARDIOLITE
11.0000 | Freq: Once | INTRAVENOUS | Status: AC | PRN
  Administered 2012-11-13: 11 via INTRAVENOUS

## 2012-11-13 MED ORDER — TECHNETIUM TC 99M SESTAMIBI GENERIC - CARDIOLITE
33.0000 | Freq: Once | INTRAVENOUS | Status: AC | PRN
  Administered 2012-11-13: 33 via INTRAVENOUS

## 2012-11-13 MED ORDER — AMINOPHYLLINE 25 MG/ML IV SOLN
75.0000 mg | Freq: Once | INTRAVENOUS | Status: AC
  Administered 2012-11-13: 75 mg via INTRAVENOUS

## 2012-11-13 MED ORDER — REGADENOSON 0.4 MG/5ML IV SOLN
0.4000 mg | Freq: Once | INTRAVENOUS | Status: AC
  Administered 2012-11-13: 0.4 mg via INTRAVENOUS

## 2012-11-13 NOTE — Progress Notes (Signed)
[facility] SITE 3 NUCLEAR MED [address] [place], [place] [address] [phone]    Cardiology Nuclear Med Study  [name] is a 67 y.o. male     MRN : [id number]     DOB: 08-Mar-1945  Procedure Date: 11/13/2012  Nuclear Med Background Indication for Stress Test:  Evaluation for Ischemia and Stent Patency History:  ;02 Stress ECHO: EF NL MPI: 04/17/07 EF: 57%, Multiple Stents CFX and OM3 Cardiac Risk Factors: Family History - CAD, History of Smoking, Hypertension and Lipids  Symptoms:  DOE and SOB   Nuclear Pre-Procedure Caffeine/Decaff Intake:  None > 12 hrs NPO After: 7:00pm   Lungs:  clear O2 Sat: 95% on room air. IV 0.9% NS with Angio Cath:  22g  IV Site: R Antecubital x 1, tolerated well IV Started by:  Irean Hong, RN  Chest Size (in):  52 Cup Size: n/a  Height: 6' (1.829 m)  Weight:  248 lb (112.492 kg)  BMI:  Body mass index is 33.63 kg/(m^2). Tech Comments:  Took Atenolol a 0500 today. This patient was given Aminophylline 75 mg for symptoms from the Lexiscan injection. All symptoms were resolved.    Nuclear Med Study 1 or 2 day study: 1 day  Stress Test Type:  Treadmill/Lexiscan  Reading MD: Olga Millers, MD  Order Authorizing Provider:  Peter Swazil[place], MD  Resting Radionuclide: Technetium 76m Sestamibi  Resting Radionuclide Dose: 11.0 mCi   Stress Radionuclide:  Technetium 4m Sestamibi  Stress Radionuclide Dose: 33.0 mCi           Stress Protocol Rest HR: 48 Stress HR: 91  Rest BP: 113/73 Stress BP: 135/79  Exercise Time (min): n/a METS: n/a   Predicted Max HR: 153 bpm % Max HR: 59.48 bpm Rate Pressure Product: 21308   Dose of Adenosine (mg):  n/a Dose of Lexiscan: 0.4 mg  Dose of Atropine (mg): n/a Dose of Dobutamine: n/a mcg/kg/min (at max HR)  Stress Test Technologist: Milana Na, EMT-P  Nuclear Technologist:  Dario Guardian, CNMT     Rest Procedure:  Myocardial perfusion imaging was performed at rest 45 minutes following  the intravenous administration of Technetium 58m Sestamibi. Rest ECG: Sinus bradycardia, anterior MI.  Stress Procedure:  The patient received IV Lexiscan 0.4 mg over 15-seconds with concurrent low level exercise and then Technetium 63m Sestamibi was injected at 30-seconds while the patient continued walking one more minute. This patient was sob, weakness, headache and had chest tightness with the Lexiscan injection. Quantitative spect images were obtained after a 45-minute delay. Stress ECG: No significant ST segment change suggestive of ischemia.  QPS Raw Data Images:  Acquisition technically good; normal left ventricular size. Stress Images:  There is decreased uptake in the lateral wall. Rest Images:  There is decreased uptake in the lateral wall. Subtraction (SDS):  No evidence of ischemia. Transient Ischemic Dilatation (Normal <1.22):  1.07 Lung/Heart Ratio (Normal <0.45):  0.39  Quantitative Gated Spect Images QGS EDV:  102 ml QGS ESV:  42 ml  Impression Exercise Capacity:  Lexiscan with no exercise. BP Response:  Normal blood pressure response. Clinical Symptoms:  There is dyspnea and chest tightness. ECG Impression:  No significant ST segment change suggestive of ischemia. Comparison with Prior Nuclear Study: Lateral defect new compared to 4/09.  Overall Impression:  Low risk stress nuclear study with a small, moderate intensity, fixed lateral defect consistent with soft tissue attenuation vs small prior infarct; no ischemia.  LV Ejection Fraction: 59%.  LV Wall Motion:  NL LV Function; NL Wall Motion  Olga Millers

## 2012-11-14 ENCOUNTER — Telehealth: Payer: Self-pay | Admitting: Cardiology

## 2012-11-14 DIAGNOSIS — R0602 Shortness of breath: Secondary | ICD-10-CM

## 2012-11-14 NOTE — Telephone Encounter (Signed)
Follow UP   Pt calling for results

## 2012-11-15 NOTE — Telephone Encounter (Signed)
Returned call to patient [name] results given.Patient stated he continues to have sob with the slightest exertion.Dr.Jordan out of office this afternoon will check with him tomorrow 11/16/12 and call him back.

## 2012-11-16 NOTE — Telephone Encounter (Signed)
Returned call to patient spoke to Dr.Jordan he advised needs to see pulmonary.Schedulers will call back next week with appointment.

## 2012-11-20 ENCOUNTER — Other Ambulatory Visit: Payer: Self-pay | Admitting: *Deleted

## 2012-11-20 DIAGNOSIS — M1710 Unilateral primary osteoarthritis, unspecified knee: Secondary | ICD-10-CM

## 2012-11-20 MED ORDER — HYDROCODONE-ACETAMINOPHEN 7.5-325 MG PO TABS: 1.0000 | ORAL_TABLET | Freq: Three times a day (TID) | ORAL | Status: DC | PRN

## 2012-11-20 NOTE — Telephone Encounter (Signed)
rx ready for pickup 

## 2012-11-20 NOTE — Telephone Encounter (Signed)
Up front 

## 2012-11-20 NOTE — Telephone Encounter (Signed)
Patient of Dr Modesto Charon. Rx last filled on 10-17-12 for #90. Patient last seen in office on 08-29-12. Please advise. If approved please print and route to Pool B so patient can be called to come and pick up

## 2012-12-06 ENCOUNTER — Encounter: Payer: Self-pay | Admitting: Pulmonary Disease

## 2012-12-06 ENCOUNTER — Telehealth: Payer: Self-pay

## 2012-12-06 ENCOUNTER — Ambulatory Visit (INDEPENDENT_AMBULATORY_CARE_PROVIDER_SITE_OTHER): Payer: Medicare Other | Admitting: Pulmonary Disease

## 2012-12-06 ENCOUNTER — Ambulatory Visit (INDEPENDENT_AMBULATORY_CARE_PROVIDER_SITE_OTHER)
Admission: RE | Admit: 2012-12-06 | Discharge: 2012-12-06 | Disposition: A | Discharge status: Home / Self Care | Payer: Medicare Other | Source: Ambulatory Visit | Attending: Pulmonary Disease | Admitting: Pulmonary Disease

## 2012-12-06 ENCOUNTER — Ambulatory Visit: Payer: Medicare Other | Admitting: Family Medicine

## 2012-12-06 VITALS — BP 138/90 | HR 53 | Ht 71.5 in | Wt 257.0 lb

## 2012-12-06 DIAGNOSIS — R0602 Shortness of breath: Secondary | ICD-10-CM

## 2012-12-06 DIAGNOSIS — J449 Chronic obstructive pulmonary disease, unspecified: Secondary | ICD-10-CM

## 2012-12-06 DIAGNOSIS — J4489 Other specified chronic obstructive pulmonary disease: Secondary | ICD-10-CM

## 2012-12-06 MED ORDER — ALBUTEROL SULFATE HFA 108 (90 BASE) MCG/ACT IN AERS: 2.0000 | INHALATION_SPRAY | Freq: Four times a day (QID) | RESPIRATORY_TRACT | Status: DC | PRN

## 2012-12-06 MED ORDER — TIOTROPIUM BROMIDE MONOHYDRATE 18 MCG IN CAPS: 18.0000 ug | ORAL_CAPSULE | Freq: Every day | RESPIRATORY_TRACT | Status: DC

## 2012-12-06 NOTE — Patient Instructions (Signed)
You have COPD (chronic obstructive pulmonary disease) Use spiriva daily no matter how you feel Use albuterol as needed for shortness of breath (2 puffs every 4-6 hours as needed) Stay active by exercising and trying to lose weight We will see you back in 2-3 months or sooner if needed

## 2012-12-06 NOTE — Assessment & Plan Note (Signed)
COPD: GOLD Grade A Combined recommendations from the [employer]	[place], [employer], Designer, television/film set, European Respiratory Society (Qaseem A et al, Ann Intern Med. 2011;155(3):179) recommends tobacco cessation, pulmonary rehab (for symptomatic patients with an FEV1 < 50% predicted), supplemental oxygen (for patients with SaO2 <88% or paO2 <55), and appropriate bronchodilator therapy.  In regards to long acting bronchodilators, they recommend monotherapy (FEV1 60-80% with symptoms weak evidence, FEV1 with symptoms <60% strong evidence), or combination therapy (FEV1 <60% with symptoms, strong recommendation, moderate evidence).  One should also provide patients with annual immunizations and consider therapy for prevention of COPD exacerbations (ie. roflumilast or azithromycin) when appopriate.  -O2 therapy: Not indicated -Immunizations: Flu up to date, pneumonia up to date -Tobacco use: quit several decades ago -Exercise: Encouraged regular exercise and weight loss -Bronchodilator therapy: Start Spiriva daily -Exacerbation prevention: Spiriva

## 2012-12-06 NOTE — Telephone Encounter (Signed)
Message copied by Velvet Bathe on Thu Dec 06, 2012  2:10 PM ------      Message from: Max Fickle B      Created: Thu Dec 06, 2012  1:55 PM       A,            Please let him know that the CXR was normal            Thanks      B ------

## 2012-12-06 NOTE — Progress Notes (Signed)
Subjective:    Patient ID: [name], male    DOB: 10/13/1945, 67 y.o.   MRN: [id number]  HPI  This is a very pleasant 67 year old male who comes to our clinic today for evaluation of shortness of breath. He had no childhood respiratory illnesses has never had lung problems up until about the last 2-3 years. He smoked up to 3 packs a day for 20 years and then quit smoking 45 years ago.  He says that for the last 2-3 years he's noticed shortness of breath with activities that require moderate to heavy exertion. Specifically, he says that carrying in groceries or doing yard work while pushing heavy equipment makes him short of breath. He can walk on level ground without too much difficulty. He can, flight of stairs without stopping but he says he short of breath when he makes up to the top. He has limited some of his activity because of this problem. He has not been hunting as much lately because of shortness of breath. In the last several years he has gained as much as 20-30 pounds. He has a daily cough which is productive of clear to yellow colored sputum. He does wheeze and have some chest tightness. He denies chest pain. He has chronic leg swelling which is been unchanged in the last several years. He recently had an evaluation by his cardiologist which included a stress test which was normal.  He does not have recurrent episodes of bronchitis or chest infections. He was previously a Psychologist, occupational. He worked with both gas and arcwelding techniques. He did this for several decades and quit about 10 years ago.    Past Medical History  Diagnosis Date  . Coronary atherosclerosis of unspecified type of vessel, native or graft   . Unspecified essential hypertension   . Other and unspecified hyperlipidemia   . Unspecified sleep apnea   . Chronic airway obstruction, not elsewhere classified   . Arthropathy, unspecified, site unspecified   . Hemorrhage of gastrointestinal tract, unspecified   .  Thyroid disease     hypothyroid     Family History  Problem Relation Age of Onset  . Colon cancer Neg Hx      History   Social History  . Marital Status: Married    Spouse Name: N/A    Number of Children: 2  . 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 Smoking status: Former Smoker -- 3.00 packs/day for 20 years    Types: Cigarettes    Quit date: 05/26/1975  . Smokeless tobacco: Former Neurosurgeon    Types: Chew    Quit date: 01/04/2000  . Alcohol Use: No  . Drug Use: No  . Sexual Activity: Not on file   Other Topics Concern  . Not on file   Social History Narrative   Occupation:[employer]ologist   Married 2 children one boy,one girl.     No Known Allergies   Outpatient Prescriptions Prior to Visit  Medication Sig Dispense Refill  . aspirin 325 MG tablet Take 325 mg by mouth daily.        Marland Kitchen atenolol (TENORMIN) 50 MG tablet TAKE ONE TABLET BY MOUTH ONE TIME DAILY  90 tablet  3  . HYDROcodone-acetaminophen (NORCO) 7.5-325 MG per tablet Take 1 tablet by mouth 3 (three) times daily as needed.  90 tablet  0  . levothyroxine (SYNTHROID, LEVOTHROID) 175 MCG tablet  Take 1 tablet (175 mcg total) by mouth daily.  30 tablet  11  . lisinopril-hydrochlorothiazide (PRINZIDE,ZESTORETIC) 20-25 MG per tablet Take 1 tablet by mouth daily.  30 tablet  11  . nitroGLYCERIN (NITROSTAT) 0.4 MG SL tablet Place 0.4 mg under the tongue every 5 (five) minutes as needed.        . simvastatin (ZOCOR) 40 MG tablet TAKE ONE TABLET BY MOUTH AT BEDTIME  90 tablet  3  . FLUZONE HIGH-DOSE injection        No facility-administered medications prior to visit.      Review of Systems  Constitutional: Negative for fever and unexpected weight change.  HENT: Positive for rhinorrhea. Negative for congestion, dental problem, ear pain, nosebleeds, postnasal drip, sinus pressure, sneezing, sore throat and trouble swallowing.   Eyes:  Negative for redness and itching.  Respiratory: Positive for cough, shortness of breath and wheezing. Negative for chest tightness.   Cardiovascular: Positive for leg swelling. Negative for palpitations.  Gastrointestinal: Negative for nausea and vomiting.  Genitourinary: Negative for dysuria.  Musculoskeletal: Positive for joint swelling.  Skin: Negative for rash.  Neurological: Negative for headaches.  Hematological: Does not bruise/bleed easily.  Psychiatric/Behavioral: Negative for dysphoric mood. The patient is not nervous/anxious.        Objective:   Physical Exam  Filed Vitals:   12/06/12 0955  BP: 138/90  Pulse: 53  Height: 5' 11.5" (1.816 m)  Weight: 257 lb (116.574 kg)  SpO2: 96%   Gen: well appearing, no acute distress HEENT: NCAT, PERRL, EOMi, OP clear, neck supple without masses PULM: CTA B CV: RRR, no mgr, no JVD AB: BS+, soft, nontender, no hsm Ext: warm, no edema, no clubbing, no cyanosis Derm: no rash or skin breakdown Neuro: A&Ox4, CN II-XII intact, strength 5/5 in all 4 extremities  12/05/2012 simple spirometry> ratio 70% (predicted ratio 77%), clear airflow obstruction on flow volume loop, FEV1 2.50 L (60% predicted)     Assessment & Plan:   COPD COPD: GOLD Grade A Combined recommendations from the [employer], [employer] of [employer]icians, Designer, television/film set, European Respiratory Society (Qaseem A et al, Ann Intern Med. 2011;155(3):179) recommends tobacco cessation, pulmonary rehab (for symptomatic patients with an FEV1 < 50% predicted), supplemental oxygen (for patients with SaO2 <88% or paO2 <55), and appropriate bronchodilator therapy.  In regards to long acting bronchodilators, they recommend monotherapy (FEV1 60-80% with symptoms weak evidence, FEV1 with symptoms <60% strong evidence), or combination therapy (FEV1 <60% with symptoms, strong recommendation, moderate evidence).  One should also provide patients with  annual immunizations and consider therapy for prevention of COPD exacerbations (ie. roflumilast or azithromycin) when appopriate.  -O2 therapy: Not indicated -Immunizations: Flu up to date, pneumonia up to date -Tobacco use: quit several decades ago -Exercise: Encouraged regular exercise and weight loss -Bronchodilator therapy: Start Spiriva daily -Exacerbation prevention: Spiriva    Updated Medication List Outpatient Encounter Prescriptions as of 12/06/2012  Medication Sig  . aspirin 325 MG tablet Take 325 mg by mouth daily.    Marland Kitchen atenolol (TENORMIN) 50 MG tablet TAKE ONE TABLET BY MOUTH ONE TIME DAILY  . HYDROcodone-acetaminophen (NORCO) 7.5-325 MG per tablet Take 1 tablet by mouth 3 (three) times daily as needed.  Marland Kitchen levothyroxine (SYNTHROID, LEVOTHROID) 175 MCG tablet Take 1 tablet (175 mcg total) by mouth daily.  Marland Kitchen lisinopril-hydrochlorothiazide (PRINZIDE,ZESTORETIC) 20-25 MG per tablet Take 1 tablet by mouth daily.  . nitroGLYCERIN (NITROSTAT) 0.4 MG SL tablet Place 0.4 mg under  the tongue every 5 (five) minutes as needed.    . simvastatin (ZOCOR) 40 MG tablet TAKE ONE TABLET BY MOUTH AT BEDTIME  . albuterol (PROAIR HFA) 108 (90 BASE) MCG/ACT inhaler Inhale 2 puffs into the lungs every 6 (six) hours as needed for wheezing or shortness of breath.  . tiotropium (SPIRIVA HANDIHALER) 18 MCG inhalation capsule Place 1 capsule (18 mcg total) into inhaler and inhale daily.  . [DISCONTINUED] FLUZONE HIGH-DOSE injection

## 2012-12-06 NOTE — Telephone Encounter (Signed)
LMTCB X1 

## 2012-12-06 NOTE — Telephone Encounter (Signed)
Pt returned call, I advised him that his cxr was normal and to continue with office recs made at [facility].  Pt understands.  No further action needed at this time. Caulfield,Ashley L

## 2012-12-17 ENCOUNTER — Encounter: Payer: Self-pay | Admitting: Family Medicine

## 2012-12-17 ENCOUNTER — Encounter (INDEPENDENT_AMBULATORY_CARE_PROVIDER_SITE_OTHER): Payer: Medicare Other | Admitting: Family Medicine

## 2012-12-17 NOTE — Progress Notes (Signed)
Patient ID: [name], male   DOB: 12/09/1945, 67 y.o.   MRN: [id number] Patient not seen. Couldn't wait.  Lakeishia Truluck P. [place] Charon, M.D.

## 2012-12-24 ENCOUNTER — Ambulatory Visit (INDEPENDENT_AMBULATORY_CARE_PROVIDER_SITE_OTHER): Payer: Medicare Other | Admitting: Family Medicine

## 2012-12-24 ENCOUNTER — Encounter: Payer: Self-pay | Admitting: Family Medicine

## 2012-12-24 VITALS — BP 124/75 | HR 59 | Temp 98.4°F | Ht 71.5 in | Wt 254.0 lb

## 2012-12-24 DIAGNOSIS — Z23 Encounter for immunization: Secondary | ICD-10-CM

## 2012-12-24 DIAGNOSIS — I10 Essential (primary) hypertension: Secondary | ICD-10-CM

## 2012-12-24 DIAGNOSIS — E079 Disorder of thyroid, unspecified: Secondary | ICD-10-CM

## 2012-12-24 DIAGNOSIS — M1710 Unilateral primary osteoarthritis, unspecified knee: Secondary | ICD-10-CM

## 2012-12-24 DIAGNOSIS — K219 Gastro-esophageal reflux disease without esophagitis: Secondary | ICD-10-CM

## 2012-12-24 DIAGNOSIS — E785 Hyperlipidemia, unspecified: Secondary | ICD-10-CM

## 2012-12-24 DIAGNOSIS — J449 Chronic obstructive pulmonary disease, unspecified: Secondary | ICD-10-CM

## 2012-12-24 DIAGNOSIS — G4730 Sleep apnea, unspecified: Secondary | ICD-10-CM

## 2012-12-24 DIAGNOSIS — J4489 Other specified chronic obstructive pulmonary disease: Secondary | ICD-10-CM

## 2012-12-24 DIAGNOSIS — I2510 Atherosclerotic heart disease of native coronary artery without angina pectoris: Secondary | ICD-10-CM

## 2012-12-24 LAB — POCT CBC
Granulocyte percent: 56 %G (ref 37–80)
HCT, POC: 47.5 % (ref 43.5–53.7)
Hemoglobin: 15.5 g/dL (ref 14.1–18.1)
Lymph, poc: 2.7 (ref 0.6–3.4)
MCH, POC: 30.1 pg (ref 27–31.2)
MCHC: 32.6 g/dL (ref 31.8–35.4)
MCV: 92.2 fL (ref 80–97)
MPV: 6.8 fL (ref 0–99.8)
POC Granulocyte: 4 (ref 2–6.9)
POC LYMPH PERCENT: 37.9 %L (ref 10–50)
Platelet Count, POC: 210 10*3/uL (ref 142–424)
RBC: 5.2 M/uL (ref 4.69–6.13)
RDW, POC: 13.7 %
WBC: 7.2 10*3/uL (ref 4.6–10.2)

## 2012-12-24 MED ORDER — HYDROCODONE-ACETAMINOPHEN 7.5-325 MG PO TABS: 1.0000 | ORAL_TABLET | Freq: Three times a day (TID) | ORAL | Status: DC | PRN

## 2012-12-24 NOTE — Progress Notes (Signed)
Patient ID: [name], male   DOB: [birth year], 67 y.o.   MRN: [id number] SUBJECTIVE: CC: Chief Complaint  Patient presents with  . Follow-up    follow up on chronic medical conditions  . Medication Refill    refill hydrocodone    HPI: Breakfast: sausage and egg this am and a biscuit Lunch: doesn't remember Supper: [employer]  Patient is here for follow up of hyperlipidemia/CAD/HTN/OSA/Obesity/hypothyroid: denies Headache;denies Chest Pain;denies weakness;denies Shortness of Breath and orthopnea;denies Visual changes;denies palpitations;denies cough;denies pedal edema;denies symptoms of TIA or stroke;deniesClaudication symptoms. admits to Compliance with medications; denies Problems with medications.  Arthritis is so bad that he needs an opiod to relieve the pain in the knees. He knows that if he loses weight his knees may be better.   Saw pulmonary for COPD. Has avoided cigarette smoking  Past Medical History  Diagnosis Date  . Coronary atherosclerosis of unspecified type of vessel, native or graft   . Unspecified essential hypertension   . Other and unspecified hyperlipidemia   . Unspecified sleep apnea   . Chronic airway obstruction, not elsewhere classified   . Arthropathy, unspecified, site unspecified   . Hemorrhage of gastrointestinal tract, unspecified   . Thyroid disease     hypothyroid   Past Surgical History  Procedure Laterality Date  . Coronary stent placement    . Hip arthroplasty    . Ankle surgery    . Hemorrhoid surgery     History   Social History  . Marital Status: Married    Spouse Name: N/A    Number of Children: 2  . 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 Smoking status: Former Smoker -- 3.00 packs/day for 20 years    Types: Cigarettes    Quit date: 05/26/1975  . Smokeless tobacco: Former Neurosurgeon    Types: Chew    Quit date: 01/04/2000  . Alcohol Use: No  .  Drug Use: No  . Sexual Activity: Not on file   Other Topics Concern  . Not on file   Social History Narrative   Occupation:[employer]ologist   Married 2 children one boy,one girl.   Family History  Problem Relation Age of Onset  . Colon cancer Neg Hx    Current Outpatient Prescriptions on File Prior to Visit  Medication Sig Dispense Refill  . albuterol (PROAIR HFA) 108 (90 BASE) MCG/ACT inhaler Inhale 2 puffs into the lungs every 6 (six) hours as needed for wheezing or shortness of breath.  1 Inhaler  2  . aspirin 325 MG tablet Take 325 mg by mouth daily.        Marland Kitchen atenolol (TENORMIN) 50 MG tablet TAKE ONE TABLET BY MOUTH ONE TIME DAILY  90 tablet  3  . levothyroxine (SYNTHROID, LEVOTHROID) 175 MCG tablet Take 1 tablet (175 mcg total) by mouth daily.  30 tablet  11  . lisinopril-hydrochlorothiazide (PRINZIDE,ZESTORETIC) 20-25 MG per tablet Take 1 tablet by mouth daily.  30 tablet  11  . nitroGLYCERIN (NITROSTAT) 0.4 MG SL tablet Place 0.4 mg under the tongue every 5 (five) minutes as needed.        . simvastatin (ZOCOR) 40 MG tablet TAKE ONE TABLET BY MOUTH AT BEDTIME  90 tablet  3  . tiotropium (SPIRIVA HANDIHALER) 18 MCG inhalation capsule Place 1 capsule (18 mcg total) into inhaler and inhale daily.  30 capsule  2   No current facility-administered  medications on file prior to visit.   No Known Allergies Immunization History  Administered Date(s) Administered  . Influenza Split 10/06/2012  . Pneumococcal Conjugate-13 12/24/2012   Prior to Admission medications   Medication Sig Start Date End Date Taking? Authorizing Provider  albuterol (PROAIR HFA) 108 (90 BASE) MCG/ACT inhaler Inhale 2 puffs into the lungs every 6 (six) hours as needed for wheezing or shortness of breath. 12/06/12   Lupita Leash, MD  aspirin 325 MG tablet Take 325 mg by mouth daily.      Historical Provider, MD  atenolol (TENORMIN) 50 MG tablet TAKE ONE TABLET BY MOUTH ONE TIME DAILY 08/29/12   Ileana Ladd, MD  HYDROcodone-acetaminophen (NORCO) 7.5-325 MG per tablet Take 1 tablet by mouth 3 (three) times daily as needed. 11/20/12   Mary-Margaret Daphine Deutscher, FNP  levothyroxine (SYNTHROID, LEVOTHROID) 175 MCG tablet Take 1 tablet (175 mcg total) by mouth daily. 08/29/12   Ileana Ladd, MD  lisinopril-hydrochlorothiazide (PRINZIDE,ZESTORETIC) 20-25 MG per tablet Take 1 tablet by mouth daily. 08/29/12 08/29/13  Ileana Ladd, MD  nitroGLYCERIN (NITROSTAT) 0.4 MG SL tablet Place 0.4 mg under the tongue every 5 (five) minutes as needed.      Historical Provider, MD  simvastatin (ZOCOR) 40 MG tablet TAKE ONE TABLET BY MOUTH AT BEDTIME 08/29/12   Ileana Ladd, MD  tiotropium (SPIRIVA HANDIHALER) 18 MCG inhalation capsule Place 1 capsule (18 mcg total) into inhaler and inhale daily. 12/06/12 12/06/13  Lupita Leash, MD     ROS: As above in the HPI. All other systems are stable or negative.  OBJECTIVE: APPEARANCE:  Patient in no acute distress.The patient appeared well nourished and normally developed. Acyanotic. Waist: VITAL SIGNS:BP 124/75  Pulse 59  Temp(Src) 98.4 F (36.9 C) (Oral)  Ht 5' 11.5" (1.816 m)  Wt 254 lb (115.214 kg)  BMI 34.94 kg/m2 Large built Obese WM  SKIN: warm and  Dry without overt rashes, tattoos and scars  HEAD and Neck: without JVD, Head and scalp: normal Eyes:No scleral icterus. Fundi normal, eye movements normal. Ears: Auricle normal, canal normal, Tympanic membranes normal, insufflation normal. Nose: normal Throat: normal Neck & thyroid: normal  CHEST & LUNGS: Chest wall: normal Lungs: Clear  CVS: Reveals the PMI to be normally located. Regular rhythm, First and Second Heart sounds are normal,  absence of murmurs, rubs or gallops. Peripheral vasculature: Radial pulses: normal Dorsal pedis pulses: normal Posterior pulses: normal  ABDOMEN:  Appearance:obese Benign, no organomegaly, no masses, no Abdominal Aortic enlargement. No Guarding , no  rebound. No Bruits. Bowel sounds: normal  RECTAL: N/A GU: N/A  EXTREMETIES: nonedematous.  MUSCULOSKELETAL:  Spine: reduced ROM Joints: bilateral arthritic knees. crepitus  NEUROLOGIC: oriented to time,place and person; nonfocal. Strength is normal Sensory is normal Reflexes are normal Cranial Nerves are normal.  Results for orders placed in visit on 12/24/12  POCT CBC      Result Value Range   WBC 7.2  4.6 - 10.2 K/uL   Lymph, poc 2.7  0.6 - 3.4   POC LYMPH PERCENT 37.9  10 - 50 %L   POC Granulocyte 4.0  2 - 6.9   Granulocyte percent 56.0  37 - 80 %G   RBC 5.2  4.69 - 6.13 M/uL   Hemoglobin 15.5  14.1 - 18.1 g/dL   HCT, POC 21.3  08.6 - 53.7 %   MCV 92.2  80 - 97 fL   MCH, POC 30.1  27 - 31.2 pg  MCHC 32.6  31.8 - 35.4 g/dL   RDW, POC 40.9     Platelet Count, POC 210.0  142 - 424 K/uL   MPV 6.8  0 - 99.8 fL    ASSESSMENT:   Unspecified essential hypertension - Plan: CMP14+EGFR, POCT CBC  Other and unspecified hyperlipidemia - Plan: NMR, lipoprofile  Need for prophylactic vaccination against Streptococcus pneumoniae (pneumococcus) - Plan: Pneumococcal conjugate vaccine 13-valent  COPD  Thyroid disease - Plan: Thyroid Panel With TSH  Coronary atherosclerosis of unspecified type of vessel, native or graft  SLEEP APNEA  Arthritis of knee - Plan: HYDROcodone-acetaminophen (NORCO) 7.5-325 MG per tablet  GERD  PLAN:      Dr Woodroe Mode Recommendations  For nutrition information, I recommend books:  1).Eat to Live by Dr Monico Hoar. 2).Prevent and Reverse Heart Disease by Dr Suzzette Righter. 3) Dr Katherina Right Book:  Program to Reverse Diabetes  Exercise recommendations are:  If unable to walk, then the patient can exercise in a chair 3 times a day. By flapping arms like a bird gently and raising legs outwards to the front.  If ambulatory, the patient can go for walks for 30 minutes 3 times a week. Then increase the intensity and duration as  tolerated.  Goal is to try to attain exercise frequency to 5 times a week.  If applicable: Best to perform resistance exercises (machines or weights) 2 days a week and cardio type exercises 3 days per week.  Counseled on weight reduction with dietary changes and exercise.   Orders Placed This Encounter  Procedures  . Pneumococcal conjugate vaccine 13-valent  . CMP14+EGFR  . NMR, lipoprofile  . Thyroid Panel With TSH  . POCT CBC   Meds ordered this encounter  Medications  . HYDROcodone-acetaminophen (NORCO) 7.5-325 MG per tablet    Sig: Take 1 tablet by mouth 3 (three) times daily as needed.    Dispense:  90 tablet    Refill:  0   Medications Discontinued During This Encounter  Medication Reason  . HYDROcodone-acetaminophen (NORCO) 7.5-325 MG per tablet Reorder   Return in about 3 months (around 03/24/2013) for Recheck medical problems.  Shlome Baldree P. [place] Charon, M.D.

## 2012-12-24 NOTE — Patient Instructions (Addendum)
Pneumococcal Vaccine, Polyvalent suspension for injection What is this medicine? PNEUMOCOCCAL VACCINE, POLYVALENT (NEU mo KOK al vak SEEN, pol ee VEY luhnt) is a vaccine to prevent pneumococcus bacteria infection. These bacteria are a major cause of ear infections, 'Strep throat' infections, and serious pneumonia, meningitis, or blood infections worldwide. These vaccines help the body to produce antibodies (protective substances) that help your body defend against these bacteria. This vaccine is recommended for infants and young children. This vaccine will not treat an infection. This medicine may be used for other purposes; ask your health care provider or pharmacist if you have questions. COMMON BRAND NAME(S): Prevnar 13 , Prevnar What should I tell my health care provider before I take this medicine? They need to know if you have any of these conditions: -bleeding problems -fever -immune system problems -low platelet count in the blood -seizures -an unusual or allergic reaction to pneumococcal vaccine, diphtheria toxoid, other vaccines, latex, other medicines, foods, dyes, or preservatives -pregnant or trying to get pregnant -breast-feeding How should I use this medicine? This vaccine is for injection into a muscle. It is given by a health care professional. A copy of Vaccine Information Statements will be given before each vaccination. Read this sheet carefully each time. The sheet may change frequently. Talk to your pediatrician regarding the use of this medicine in children. While this drug may be prescribed for children as young as 6 weeks old for selected conditions, precautions do apply. Overdosage: If you think you have taken too much of this medicine contact a poison control center or emergency room at once. NOTE: This medicine is only for you. Do not share this medicine with others. What if I miss a dose? It is important not to miss your dose. Call your doctor or health care  professional if you are unable to keep an appointment. What may interact with this medicine? -medicines for cancer chemotherapy -medicines that suppress your immune function -medicines that treat or prevent blood clots like warfarin, enoxaparin, and dalteparin -steroid medicines like prednisone or cortisone This list may not describe all possible interactions. Give your health care provider a list of all the medicines, herbs, non-prescription drugs, or dietary supplements you use. Also tell them if you smoke, drink alcohol, or use illegal drugs. Some items may interact with your medicine. What should I watch for while using this medicine? Mild fever and pain should go away in 3 days or less. Report any unusual symptoms to your doctor or health care professional. What side effects may I notice from receiving this medicine? Side effects that you should report to your doctor or health care professional as soon as possible: -allergic reactions like skin rash, itching or hives, swelling of the face, lips, or tongue -breathing problems -confused -fever over 102 degrees F -pain, tingling, numbness in the hands or feet -seizures -unusual bleeding or bruising -unusual muscle weakness Side effects that usually do not require medical attention (report to your doctor or health care professional if they continue or are bothersome): -aches and pains -diarrhea -fever of 102 degrees F or less -headache -irritable -loss of appetite -pain, tender at site where injected -trouble sleeping This list may not describe all possible side effects. Call your doctor for medical advice about side effects. You may report side effects to FDA at 1-800-FDA-1088. Where should I keep my medicine? This does not apply. This vaccine is given in a clinic, pharmacy, doctor's office, or other health care setting and will not be stored at   home. NOTE: This sheet is a summary. It may not cover all possible information. If you  have questions about this medicine, talk to your doctor, pharmacist, or health care provider.  2014, Elsevier/Gold Standard. (2008-03-04 10:17:22)        Dr Francis Wong's Recommendations  For nutrition information, I recommend books:  1).Eat to Live by Dr Joel Fuhrman. 2).Prevent and Reverse Heart Disease by Dr Caldwell Esselstyn. 3) Dr Neal Barnard's Book:  Program to Reverse Diabetes  Exercise recommendations are:  If unable to walk, then the patient can exercise in a chair 3 times a day. By flapping arms like a bird gently and raising legs outwards to the front.  If ambulatory, the patient can go for walks for 30 minutes 3 times a week. Then increase the intensity and duration as tolerated.  Goal is to try to attain exercise frequency to 5 times a week.  If applicable: Best to perform resistance exercises (machines or weights) 2 days a week and cardio type exercises 3 days per week.  

## 2012-12-25 LAB — THYROID PANEL WITH TSH
Free Thyroxine Index: 2.5 (ref 1.2–4.9)
T3 Uptake Ratio: 31 % (ref 24–39)
T4, Total: 8.2 ug/dL (ref 4.5–12.0)
TSH: 1.81 u[IU]/mL (ref 0.450–4.500)

## 2012-12-25 LAB — NMR, LIPOPROFILE
Cholesterol: 128 mg/dL (ref <200)
HDL Cholesterol by NMR: 45 mg/dL (ref >=40)
HDL Particle Number: 36.2 umol/L (ref >=30.5)
LDL Particle Number: 1082 nmol/L — ABNORMAL HIGH (ref <1000)
LDL Size: 20.4 nm — ABNORMAL LOW (ref >20.5)
LDLC SERPL CALC-MCNC: 53 mg/dL (ref <100)
LP-IR Score: 69 — ABNORMAL HIGH (ref <=45)
Small LDL Particle Number: 644 nmol/L — ABNORMAL HIGH (ref <=527)
Triglycerides by NMR: 151 mg/dL — ABNORMAL HIGH (ref <150)

## 2012-12-25 LAB — CMP14+EGFR
ALT: 10 IU/L (ref 0–44)
AST: 14 IU/L (ref 0–40)
Albumin/Globulin Ratio: 2.1 (ref 1.1–2.5)
Albumin: 4.1 g/dL (ref 3.6–4.8)
Alkaline Phosphatase: 48 IU/L (ref 39–117)
BUN/Creatinine Ratio: 19 (ref 10–22)
BUN: 18 mg/dL (ref 8–27)
CO2: 21 mmol/L (ref 18–29)
Calcium: 9.2 mg/dL (ref 8.6–10.2)
Chloride: 101 mmol/L (ref 97–108)
Creatinine, Ser: 0.94 mg/dL (ref 0.76–1.27)
GFR calc Af Amer: 96 mL/min/{1.73_m2} (ref >59)
GFR calc non Af Amer: 83 mL/min/{1.73_m2} (ref >59)
Globulin, Total: 2 g/dL (ref 1.5–4.5)
Glucose: 95 mg/dL (ref 65–99)
Potassium: 4.7 mmol/L (ref 3.5–5.2)
Sodium: 140 mmol/L (ref 134–144)
Total Bilirubin: 0.3 mg/dL (ref 0.0–1.2)
Total Protein: 6.1 g/dL (ref 6.0–8.5)

## 2013-01-15 ENCOUNTER — Observation Stay (HOSPITAL_COMMUNITY)
Admission: EM | Admit: 2013-01-15 | Discharge: 2013-01-15 | Disposition: A | Discharge status: Home / Self Care | Payer: Medicare HMO | Attending: Cardiology | Admitting: Cardiology

## 2013-01-15 ENCOUNTER — Encounter (HOSPITAL_COMMUNITY)
Admission: EM | Disposition: A | Discharge status: Home / Self Care | Payer: Self-pay | Source: Home / Self Care | Attending: Cardiology

## 2013-01-15 ENCOUNTER — Emergency Department (HOSPITAL_COMMUNITY): Payer: Medicare HMO

## 2013-01-15 ENCOUNTER — Encounter (HOSPITAL_COMMUNITY): Payer: Self-pay | Admitting: Emergency Medicine

## 2013-01-15 DIAGNOSIS — I2510 Atherosclerotic heart disease of native coronary artery without angina pectoris: Secondary | ICD-10-CM | POA: Diagnosis present

## 2013-01-15 DIAGNOSIS — E785 Hyperlipidemia, unspecified: Secondary | ICD-10-CM | POA: Diagnosis present

## 2013-01-15 DIAGNOSIS — I200 Unstable angina: Secondary | ICD-10-CM | POA: Insufficient documentation

## 2013-01-15 DIAGNOSIS — I214 Non-ST elevation (NSTEMI) myocardial infarction: Secondary | ICD-10-CM

## 2013-01-15 DIAGNOSIS — Z9861 Coronary angioplasty status: Secondary | ICD-10-CM | POA: Insufficient documentation

## 2013-01-15 DIAGNOSIS — R079 Chest pain, unspecified: Secondary | ICD-10-CM | POA: Diagnosis present

## 2013-01-15 DIAGNOSIS — Z87891 Personal history of nicotine dependence: Secondary | ICD-10-CM | POA: Insufficient documentation

## 2013-01-15 DIAGNOSIS — J449 Chronic obstructive pulmonary disease, unspecified: Secondary | ICD-10-CM | POA: Diagnosis present

## 2013-01-15 DIAGNOSIS — E039 Hypothyroidism, unspecified: Secondary | ICD-10-CM | POA: Insufficient documentation

## 2013-01-15 DIAGNOSIS — I10 Essential (primary) hypertension: Secondary | ICD-10-CM | POA: Diagnosis present

## 2013-01-15 DIAGNOSIS — J4489 Other specified chronic obstructive pulmonary disease: Secondary | ICD-10-CM | POA: Insufficient documentation

## 2013-01-15 DIAGNOSIS — G4730 Sleep apnea, unspecified: Secondary | ICD-10-CM | POA: Insufficient documentation

## 2013-01-15 HISTORY — DX: Headache: R51

## 2013-01-15 HISTORY — DX: Hypothyroidism, unspecified: E03.9

## 2013-01-15 HISTORY — PX: LEFT HEART CATHETERIZATION WITH CORONARY ANGIOGRAM: SHX5451

## 2013-01-15 HISTORY — DX: Angina pectoris, unspecified: I20.9

## 2013-01-15 HISTORY — DX: Unspecified osteoarthritis, unspecified site: M19.90

## 2013-01-15 HISTORY — PX: CARDIAC CATHETERIZATION: SHX172

## 2013-01-15 LAB — CBC
HEMATOCRIT: 41 % (ref 39.0–52.0)
Hemoglobin: 14.4 g/dL (ref 13.0–17.0)
MCH: 31.6 pg (ref 26.0–34.0)
MCHC: 35.1 g/dL (ref 30.0–36.0)
MCV: 89.9 fL (ref 78.0–100.0)
Platelets: 203 10*3/uL (ref 150–400)
RBC: 4.56 MIL/uL (ref 4.22–5.81)
RDW: 13.7 % (ref 11.5–15.5)
WBC: 6 10*3/uL (ref 4.0–10.5)

## 2013-01-15 LAB — POCT I-STAT, CHEM 8
BUN: 19 mg/dL (ref 6–23)
CREATININE: 1.2 mg/dL (ref 0.50–1.35)
Calcium, Ion: 1.18 mmol/L (ref 1.13–1.30)
Chloride: 106 mEq/L (ref 96–112)
Glucose, Bld: 110 mg/dL — ABNORMAL HIGH (ref 70–99)
HCT: 42 % (ref 39.0–52.0)
HEMOGLOBIN: 14.3 g/dL (ref 13.0–17.0)
Potassium: 3.9 mEq/L (ref 3.7–5.3)
SODIUM: 140 meq/L (ref 137–147)
TCO2: 24 mmol/L (ref 0–100)

## 2013-01-15 LAB — POCT I-STAT TROPONIN I: TROPONIN I, POC: 0.01 ng/mL (ref 0.00–0.08)

## 2013-01-15 LAB — TROPONIN I: Troponin I: <0.3 ng/mL (ref <0.30)

## 2013-01-15 LAB — PROTIME-INR
INR: 0.98 (ref 0.00–1.49)
Prothrombin Time: 12.8 seconds (ref 11.6–15.2)

## 2013-01-15 SURGERY — LEFT HEART CATHETERIZATION WITH CORONARY ANGIOGRAM
Anesthesia: LOCAL

## 2013-01-15 MED ORDER — ONDANSETRON HCL 4 MG/2ML IJ SOLN: 4.0000 mg | Freq: Four times a day (QID) | INTRAMUSCULAR | Status: DC | PRN

## 2013-01-15 MED ORDER — SODIUM CHLORIDE 0.9 % IV SOLN: 250.0000 mL | INTRAVENOUS | Status: DC | PRN

## 2013-01-15 MED ORDER — HEPARIN BOLUS VIA INFUSION
4000.0000 [IU] | Freq: Once | INTRAVENOUS | Status: AC
  Administered 2013-01-15: 4000 [IU] via INTRAVENOUS
  Filled 2013-01-15: qty 4000

## 2013-01-15 MED ORDER — HYDROCODONE-ACETAMINOPHEN 7.5-325 MG PO TABS: 1.0000 | ORAL_TABLET | Freq: Three times a day (TID) | ORAL | Status: DC | PRN

## 2013-01-15 MED ORDER — TIOTROPIUM BROMIDE MONOHYDRATE 18 MCG IN CAPS
18.0000 ug | ORAL_CAPSULE | Freq: Every day | RESPIRATORY_TRACT | Status: DC
  Filled 2013-01-15: qty 5

## 2013-01-15 MED ORDER — SODIUM CHLORIDE 0.9 % IV SOLN: INTRAVENOUS | Status: AC

## 2013-01-15 MED ORDER — LISINOPRIL 20 MG PO TABS: 20.0000 mg | ORAL_TABLET | Freq: Every day | ORAL | Status: DC

## 2013-01-15 MED ORDER — ASPIRIN 81 MG PO CHEW: 81.0000 mg | CHEWABLE_TABLET | ORAL | Status: DC

## 2013-01-15 MED ORDER — OXYCODONE-ACETAMINOPHEN 5-325 MG PO TABS
1.0000 | ORAL_TABLET | ORAL | Status: DC | PRN
  Administered 2013-01-15: 2 via ORAL

## 2013-01-15 MED ORDER — SODIUM CHLORIDE 0.9 % IJ SOLN: 3.0000 mL | INTRAMUSCULAR | Status: DC | PRN

## 2013-01-15 MED ORDER — HEPARIN (PORCINE) IN NACL 100-0.45 UNIT/ML-% IJ SOLN
1250.0000 [IU]/h | INTRAMUSCULAR | Status: DC
  Administered 2013-01-15: 1250 [IU]/h via INTRAVENOUS
  Filled 2013-01-15: qty 250

## 2013-01-15 MED ORDER — SIMVASTATIN 40 MG PO TABS
40.0000 mg | ORAL_TABLET | Freq: Every day | ORAL | Status: DC
  Administered 2013-01-15: 40 mg via ORAL
  Filled 2013-01-15 (×2): qty 1

## 2013-01-15 MED ORDER — MORPHINE SULFATE 4 MG/ML IJ SOLN
4.0000 mg | Freq: Once | INTRAMUSCULAR | Status: AC
  Administered 2013-01-15: 4 mg via INTRAVENOUS
  Filled 2013-01-15: qty 1

## 2013-01-15 MED ORDER — HEPARIN (PORCINE) IN NACL 2-0.9 UNIT/ML-% IJ SOLN
INTRAMUSCULAR | Status: AC
  Filled 2013-01-15: qty 1000

## 2013-01-15 MED ORDER — GUAIFENESIN 100 MG/5ML PO SYRP
400.0000 mg | ORAL_SOLUTION | Freq: Three times a day (TID) | ORAL | Status: DC | PRN
  Filled 2013-01-15: qty 20

## 2013-01-15 MED ORDER — OXYCODONE-ACETAMINOPHEN 5-325 MG PO TABS
ORAL_TABLET | ORAL | Status: AC
  Filled 2013-01-15: qty 2

## 2013-01-15 MED ORDER — ASPIRIN 325 MG PO TABS: 325.0000 mg | ORAL_TABLET | Freq: Every day | ORAL | Status: DC

## 2013-01-15 MED ORDER — ALBUTEROL SULFATE HFA 108 (90 BASE) MCG/ACT IN AERS: 2.0000 | INHALATION_SPRAY | Freq: Four times a day (QID) | RESPIRATORY_TRACT | Status: DC | PRN

## 2013-01-15 MED ORDER — ACETAMINOPHEN 325 MG PO TABS: 650.0000 mg | ORAL_TABLET | ORAL | Status: DC | PRN

## 2013-01-15 MED ORDER — ATENOLOL 50 MG PO TABS: 50.0000 mg | ORAL_TABLET | Freq: Every day | ORAL | Status: DC

## 2013-01-15 MED ORDER — LISINOPRIL-HYDROCHLOROTHIAZIDE 20-25 MG PO TABS: 1.0000 | ORAL_TABLET | Freq: Every day | ORAL | Status: DC

## 2013-01-15 MED ORDER — MIDAZOLAM HCL 2 MG/2ML IJ SOLN
INTRAMUSCULAR | Status: AC
  Filled 2013-01-15: qty 2

## 2013-01-15 MED ORDER — HYDROCHLOROTHIAZIDE 25 MG PO TABS: 25.0000 mg | ORAL_TABLET | Freq: Every day | ORAL | Status: DC

## 2013-01-15 MED ORDER — NITROGLYCERIN 0.2 MG/ML ON CALL CATH LAB
INTRAVENOUS | Status: AC
  Filled 2013-01-15: qty 1

## 2013-01-15 MED ORDER — SODIUM CHLORIDE 0.9 % IJ SOLN: 3.0000 mL | Freq: Two times a day (BID) | INTRAMUSCULAR | Status: DC

## 2013-01-15 MED ORDER — ALBUTEROL SULFATE (2.5 MG/3ML) 0.083% IN NEBU: 2.5000 mg | INHALATION_SOLUTION | Freq: Four times a day (QID) | RESPIRATORY_TRACT | Status: DC | PRN

## 2013-01-15 MED ORDER — ACETAMINOPHEN 500 MG PO TABS: 1000.0000 mg | ORAL_TABLET | ORAL | Status: DC | PRN

## 2013-01-15 MED ORDER — VERAPAMIL HCL 2.5 MG/ML IV SOLN
INTRAVENOUS | Status: AC
  Filled 2013-01-15: qty 2

## 2013-01-15 MED ORDER — HEPARIN SODIUM (PORCINE) 1000 UNIT/ML IJ SOLN
INTRAMUSCULAR | Status: AC
  Filled 2013-01-15: qty 1

## 2013-01-15 MED ORDER — NITROGLYCERIN 0.4 MG SL SUBL: 0.4000 mg | SUBLINGUAL_TABLET | SUBLINGUAL | Status: DC | PRN

## 2013-01-15 MED ORDER — FENTANYL CITRATE 0.05 MG/ML IJ SOLN
INTRAMUSCULAR | Status: AC
  Filled 2013-01-15: qty 2

## 2013-01-15 MED ORDER — LIDOCAINE HCL (PF) 1 % IJ SOLN
INTRAMUSCULAR | Status: AC
  Filled 2013-01-15: qty 30

## 2013-01-15 MED ORDER — ASPIRIN 81 MG PO CHEW: 324.0000 mg | CHEWABLE_TABLET | Freq: Once | ORAL | Status: DC

## 2013-01-15 MED ORDER — SODIUM CHLORIDE 0.9 % IV SOLN
1.0000 mL/kg/h | INTRAVENOUS | Status: DC
Start: 2013-01-16 — End: 2013-01-15

## 2013-01-15 MED ORDER — LEVOTHYROXINE SODIUM 175 MCG PO TABS
175.0000 ug | ORAL_TABLET | Freq: Every day | ORAL | Status: DC
  Filled 2013-01-15: qty 1

## 2013-01-15 NOTE — H&P (Signed)
HPI:  68 year old male with unstable angina. He has a history of coronary artery disease and is followed by Dr. Martinique. He has had previous PCI of the circumflex and third obtuse marginal. Last catheterization in 2010 showed a 50% circumflex and a 60% posterior lateral branch. There was a 70% right coronary artery. Ejection fraction was 60%. Nuclear study in October 2014 showed lateral infarct versus soft tissue attenuation and no ischemia. Ejection fraction 59%. Patient awoke at 2:30 AM today with palpitations followed by substernal chest pain described as a crushing sensation. It radiated to his neck, jaw and back. Similar to his previous cardiac pain. There was nausea, diaphoresis and dyspnea. His symptoms did not resolve and he presented to the emergency room. He was treated with sublingual nitroglycerin, TUMS, aspirin and morphine. He is now pain-free. Cardiology is asked to evaluate.    (Not in a hospital admission)  No Known Allergies  Past Medical History  Diagnosis Date  . Coronary atherosclerosis of unspecified type of vessel, native or graft   . Unspecified essential hypertension   . Other and unspecified hyperlipidemia   . Unspecified sleep apnea   . Chronic airway obstruction, not elsewhere classified   . Arthropathy, unspecified, site unspecified   . Thyroid disease     hypothyroid    Past Surgical History  Procedure Laterality Date  . Coronary stent placement    . Hip arthroplasty    . Ankle surgery    . Hemorrhoid surgery    . Tonsillectomy      History   Social History  . Marital Status: Married    Spouse Name: N/A    Number of Children: 2  . Years of Education: N/A   Occupational History  . 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 History Main Topics  . Smoking status: Former Smoker -- 3.00 packs/day for 20 years    Types: Cigarettes    Quit date: 05/26/1975  . Smokeless tobacco: Former Systems developer    Types: Chew    Quit date: 01/04/2000  . Alcohol  Use: No  . Drug Use: No  . Sexual Activity: Not on file   Other Topics Concern  . Not on file   Social History Narrative   Occupation:[employer] Administrator, [employer]   Married 2 children one boy,one girl.    Family History  Problem Relation Age of Onset  . Colon cancer Neg Hx   . CAD Father     MI at age 37  . Stroke Mother     ROS:  no fevers or chills, productive cough, hemoptysis, dysphasia, odynophagia, melena, hematochezia, dysuria, hematuria, rash, seizure activity, orthopnea, PND, pedal edema, claudication. Remaining systems are negative.  Physical Exam:   Blood pressure 104/71, pulse 55, temperature 97.8 F (36.6 C), temperature source Oral, resp. rate 14, height 6' (1.829 m), weight 260 lb (117.935 kg), SpO2 98.00%.  General:  Well developed/well nourished in NAD Skin warm/dry Patient not depressed No peripheral clubbing Back-normal HEENT-normal/normal eyelids Neck supple/normal carotid upstroke bilaterally; no bruits; no JVD; no thyromegaly chest - CTA/ normal expansion CV - RRR/normal S1 and S2; no murmurs, rubs or gallops;  PMI nondisplaced Abdomen -NT/ND, no HSM, no mass, + bowel sounds, no bruit 2+ femoral pulses, no bruits Ext-no edema, chords, 2+ DP Neuro-grossly nonfocal  ECG sinus rhythm with slight lateral ST elevation and nonspecific inferior T-wave changes.  Results for orders placed during the hospital encounter of 01/15/13 (from the past 48 hour(s))  CBC  Status: None   Collection Time    01/15/13  5:20 AM      Result Value Range   WBC 6.0  4.0 - 10.5 K/uL   RBC 4.56  4.22 - 5.81 MIL/uL   Hemoglobin 14.4  13.0 - 17.0 g/dL   HCT 41.0  39.0 - 52.0 %   MCV 89.9  78.0 - 100.0 fL   MCH 31.6  26.0 - 34.0 pg   MCHC 35.1  30.0 - 36.0 g/dL   RDW 13.7  11.5 - 15.5 %   Platelets 203  150 - 400 K/uL  POCT I-STAT TROPONIN I     Status: None   Collection Time    01/15/13  5:23 AM      Result Value Range   Troponin i, poc 0.01  0.00 - 0.08 ng/mL   Comment  3            Comment: Due to the release kinetics of cTnI,     a negative result within the first hours     of the onset of symptoms does not rule out     myocardial infarction with certainty.     If myocardial infarction is still suspected,     repeat the test at appropriate intervals.  POCT I-STAT, CHEM 8     Status: Abnormal   Collection Time    01/15/13  5:26 AM      Result Value Range   Sodium 140  137 - 147 mEq/L   Potassium 3.9  3.7 - 5.3 mEq/L   Chloride 106  96 - 112 mEq/L   BUN 19  6 - 23 mg/dL   Creatinine, Ser 1.20  0.50 - 1.35 mg/dL   Glucose, Bld 110 (*) 70 - 99 mg/dL   Calcium, Ion 1.18  1.13 - 1.30 mmol/L   TCO2 24  0 - 100 mmol/L   Hemoglobin 14.3  13.0 - 17.0 g/dL   HCT 42.0  39.0 - 52.0 %  PROTIME-INR     Status: None   Collection Time    01/15/13  6:24 AM      Result Value Range   Prothrombin Time 12.8  11.6 - 15.2 seconds   INR 0.98  0.00 - 1.49    Dg Chest Portable 1 View  01/15/2013   CLINICAL DATA:  Chest pain  EXAM: PORTABLE CHEST - 1 VIEW  COMPARISON:  12/06/2012  FINDINGS: Chronic interstitial coarsening at the bases, accentuated by lower lung volumes. No cardiomegaly. Tortuous aorta. No infiltrate or edema. No effusion or pneumothorax.  IMPRESSION: No active disease.   Electronically Signed   By: Jorje Guild M.D.   On: 01/15/2013 05:35    Assessment/Plan 1 unstable angina-Patient's symptoms are extremely concerning. Plan to admit and rule out myocardial infarction with serial enzymes. Continue aspirin, statin, beta blocker. Add heparin. Proceed with cardiac catheterization. The risks and benefits were discussed and the patient agrees to proceed. 2 hypertension-continue preadmission medications and adjust as needed. 3 hyperlipidemia-continue statin. 4 COPD-continue bronchodilators. Kirk Ruths MD 01/15/2013, 8:37 AM

## 2013-01-15 NOTE — Progress Notes (Signed)
ANTICOAGULATION CONSULT NOTE - Initial Consult  Pharmacy Consult for Heparin  Indication: chest pain/ACS  No Known Allergies  Patient Measurements: Height: 6' (182.9 cm) Weight: 260 lb (117.935 kg) IBW/kg (Calculated) : 77.6 HDW: ~103kg  Labs:  Recent Labs  01/15/13 0520 01/15/13 0526  HGB 14.4 14.3  HCT 41.0 42.0  PLT 203  --   CREATININE  --  1.20    Estimated Creatinine Clearance: 78.1 ml/min (by C-G formula based on Cr of 1.2).  Medical History: Past Medical History  Diagnosis Date  . Coronary atherosclerosis of unspecified type of vessel, native or graft   . Unspecified essential hypertension   . Other and unspecified hyperlipidemia   . Unspecified sleep apnea   . Chronic airway obstruction, not elsewhere classified   . Arthropathy, unspecified, site unspecified   . Hemorrhage of gastrointestinal tract, unspecified   . Thyroid disease     hypothyroid   Assessment: 68 y/o M with cardiac hx here with CP to start heparin. Labs as above.   Goal of Therapy:  Heparin level 0.3-0.7 units/ml Monitor platelets by anticoagulation protocol: Yes   Plan:  -Heparin 4000 units BOLUS x 1 -Start heparin drip at 1250 units/hr -1500 HL -Daily CBC/HL -F/U cardiology plans  Narda Bonds 01/15/2013,6:26 AM

## 2013-01-15 NOTE — ED Notes (Signed)
Per EMS, patient started having crushing pain in mid chest at 8/10 at 0230 this morning, waking him out of sleep.  Patient states it radiated to jaw and back.   He advised had SOB, diaphoresis and nausea with the episode.  Patient took 325mg  ASA PO, 3 x NTG SL, and hydrocodone at home prior to EMS arrival.   EMS placed 20 L hand.   Patient states his pain has decreased to 2/10 at this time.

## 2013-01-15 NOTE — ED Notes (Signed)
Bi-Lateral BP's Right Arm 100/63, Left Arm 104/66

## 2013-01-15 NOTE — Interval H&P Note (Signed)
Cath Lab Visit (complete for each Cath Lab visit)  Clinical Evaluation Leading to the Procedure:   ACS: yes  Non-ACS:    Anginal Classification: CCS IV  Anti-ischemic medical therapy: No Therapy  Non-Invasive Test Results: No non-invasive testing performed  Prior CABG: No previous CABG      History and Physical Interval Note:  01/15/2013 10:28 AM The patient has a one-month history of recurring episodes of nocturnal chest discomfort described as pressure. The last episode was one week ago until this morning. He was awakened around 2 AM with a prolonged spell of tightness in the chest that gradually resolved over 2 hours. There was complete resolution by the time he arrived in the ER. He has history of prior circumflex stenting x2. History of moderate disease in the right coronary cath and 2010. He is currently pain-free.   [name]  has presented today for surgery, with the diagnosis of cp  The various methods of treatment have been discussed with the patient and family. After consideration of risks, benefits and other options for treatment, the patient has consented to  Procedure(s): LEFT HEART CATHETERIZATION WITH CORONARY ANGIOGRAM (N/A) as a surgical intervention .  The patient's history has been reviewed, patient examined, no change in status, stable for surgery.  I have reviewed the patient's chart and labs.  Questions were answered to the patient's satisfaction.   The procedure and potential risks including stroke, death, myocardial infarction, emergency surgery, limb ischemia, bleeding, kidney injury, among others were discussed in detail with the patient prior to anesthesia. The patient is in agreement with proceeding and with stenting if indicated.   Sinclair Grooms

## 2013-01-15 NOTE — Discharge Instructions (Signed)
Chest Pain (Nonspecific) °It is often hard to give a specific diagnosis for the cause of chest pain. There is always a chance that your pain could be related to something serious, such as a heart attack or a blood clot in the lungs. You need to follow up with your caregiver for further evaluation. °CAUSES  °· Heartburn. °· Pneumonia or bronchitis. °· Anxiety or stress. °· Inflammation around your heart (pericarditis) or lung (pleuritis or pleurisy). °· A blood clot in the lung. °· A collapsed lung (pneumothorax). It can develop suddenly on its own (spontaneous pneumothorax) or from injury (trauma) to the chest. °· Shingles infection (herpes zoster virus). °The chest wall is composed of bones, muscles, and cartilage. Any of these can be the source of the pain. °· The bones can be bruised by injury. °· The muscles or cartilage can be strained by coughing or overwork. °· The cartilage can be affected by inflammation and become sore (costochondritis). °DIAGNOSIS  °Lab tests or other studies, such as X-rays, electrocardiography, stress testing, or cardiac imaging, may be needed to find the cause of your pain.  °TREATMENT  °· Treatment depends on what may be causing your chest pain. Treatment may include: °· Acid blockers for heartburn. °· Anti-inflammatory medicine. °· Pain medicine for inflammatory conditions. °· Antibiotics if an infection is present. °· You may be advised to change lifestyle habits. This includes stopping smoking and avoiding alcohol, caffeine, and chocolate. °· You may be advised to keep your head raised (elevated) when sleeping. This reduces the chance of acid going backward from your stomach into your esophagus. °· Most of the time, nonspecific chest pain will improve within 2 to 3 days with rest and mild pain medicine. °HOME CARE INSTRUCTIONS  °· If antibiotics were prescribed, take your antibiotics as directed. Finish them even if you start to feel better. °· For the next few days, avoid physical  activities that bring on chest pain. Continue physical activities as directed. °· Do not smoke. °· Avoid drinking alcohol. °· Only take over-the-counter or prescription medicine for pain, discomfort, or fever as directed by your caregiver. °· Follow your caregiver's suggestions for further testing if your chest pain does not go away. °· Keep any follow-up appointments you made. If you do not go to an appointment, you could develop lasting (chronic) problems with pain. If there is any problem keeping an appointment, you must call to reschedule. °SEEK MEDICAL CARE IF:  °· You think you are having problems from the medicine you are taking. Read your medicine instructions carefully. °· Your chest pain does not go away, even after treatment. °· You develop a rash with blisters on your chest. °SEEK IMMEDIATE MEDICAL CARE IF:  °· You have increased chest pain or pain that spreads to your arm, neck, jaw, back, or abdomen. °· You develop shortness of breath, an increasing cough, or you are coughing up blood. °· You have severe back or abdominal pain, feel nauseous, or vomit. °· You develop severe weakness, fainting, or chills. °· You have a fever. °THIS IS AN EMERGENCY. Do not wait to see if the pain will go away. Get medical help at once. Call your local emergency services (911 in [place] Do not drive yourself to the hospital. °MAKE SURE YOU:  °· Understand these instructions. °· Will watch your condition. °· Will get help right away if you are not doing well or get worse. °Document Released: 09/29/2004 Document Revised: 03/14/2011 Document Reviewed: 07/26/2007 °Exit[employer]® Patient Information ©2014 [employer]   [employer]. ° °

## 2013-01-15 NOTE — ED Notes (Signed)
Pt reports he has had 3 nitroglycerin tablets at home prior to arrival. Will ask MD if he still wishes for pt to have another dose of nitroglycerin.

## 2013-01-15 NOTE — CV Procedure (Signed)
     Left Heart Catheterization with Coronary Angiography  Report  [name]  68 y.o.  male 1945/04/06  Procedure Date: 01/15/2013  Referring Physician: Stanford Breed Primary Cardiologist: P. Martinique, MD  INDICATIONS: Prolonged chest pain at rest in patient with prior history of Cfx stents x 2.  PROCEDURE: 1. Coronary angiography; 2. Left heart catheterization; 3. Left ventriculography.  CONSENT:  The risks, benefits, and details of the procedure were explained in detail to the patient. Risks including death, stroke, heart attack, kidney injury, allergy, limb ischemia, bleeding and radiation injury were discussed.  The patient verbalized understanding and wanted to proceed.  Informed written consent was obtained.  PROCEDURE TECHNIQUE:  After Xylocaine anesthesia a 5 French slender sheath was placed in the right radial artery with a single anterior needle wall stick.  Coronary angiography was done using a JR 4 and JL 3.5 diagnostic catheters.  Left ventriculography was done using the JR 4 catheter and hand injection.   The most recent coronary angiogram which reviewed and images compared to those obtained today. No significant changes noted.  MEDICATIONS: Versed 2 mg and fentanyl 50 mcg.  CONTRAST:  Total of 75 cc.  COMPLICATIONS:  None   HEMODYNAMICS:  Aortic pressure 90/60 mmHg; LV pressure 99/9 mmHg; LVEDP 15 mm mercury  ANGIOGRAPHIC DATA:   The left main coronary artery is widely patent.  The left anterior descending artery is widely patent. He gives origin to 2 moderate-sized diagonals which also widely patent. The LAD is transapical.  The left circumflex artery is widely patent. Irregularities are noted in the mid vessel. The proximal stent in the circumflex is widely patent. The stent in the large third obtuse marginal is widely patent. There is 50% narrowing in the ostium of the second obtuse marginal. The distal vessel is relatively small. No significant obstruction is  observed throughout the circumflex territory..  The right coronary artery is patent with mid vessel irregularities obstructing the artery up to 40%. The mid right coronary appears improved compared with the last study.Marland Kitchen  LEFT VENTRICULOGRAM:  Left ventricular angiogram was done in the 30 RAO projection and revealed overall normal function with EF of 55%.   IMPRESSIONS:  1. Widely patent coronary arteries without evidence of circumflex and obtuse marginal in-stent restenosis or thrombus.  2. Prolonged chest discomfort at rest not explained by the patient's coronary anatomy.  3. Normal left ventricular function   RECOMMENDATION:  Cycle cardiac markers. If no evidence of myocardial injury, the patient would be eligible for discharge later today. He should followup with Dr. Martinique within the next 2-4 weeks.

## 2013-01-15 NOTE — ED Provider Notes (Signed)
CSN: [id number]     Arrival date & time 01/15/13  4315 History   First MD Initiated Contact with Patient 01/15/13 0540     Chief Complaint  Patient presents with  . Chest Pain   (Consider location/radiation/quality/duration/timing/severity/associated sxs/prior Treatment) The history is provided by the patient, a relative and medical records. No language interpreter was used.    69 year old male with past medical history of coronary artery disease, previous MI, history of stent placement, COPD who presents the emergency department with chief complaint of chest pain.  The patient was awoken from sleep approximately 3 hours prior to arrival at the ED with crushing central chest pain which radiated to his back.  The patient had associated dyspnea, nausea, diaphoresis, radiation to bilateral jaws.  The patient's wife states that he became very pale.  Patient used 3 nitroglycerin, one hydrocodone, one full-sized aspirin, two tums.  Patient's pain lasted for approximately 4 hours before he had relief.  He is currently chest pain-free.  He denies any ripping or tearing pain.  Patient does have a history of recent URI. Denies fevers, chills, myalgias, arthralgias. Denies dysuria, flank pain, suprapubic pain, frequency, urgency, or hematuria. Denies headaches, light headedness, weakness, visual disturbances. Denies abdominal pain, vomiting, diarrhea or constipation.   Past Medical History  Diagnosis Date  . Coronary atherosclerosis of unspecified type of vessel, native or graft   . Unspecified essential hypertension   . Other and unspecified hyperlipidemia   . Unspecified sleep apnea   . Chronic airway obstruction, not elsewhere classified   . Arthropathy, unspecified, site unspecified   . Hemorrhage of gastrointestinal tract, unspecified   . Thyroid disease     hypothyroid   Past Surgical History  Procedure Laterality Date  . Coronary stent placement    . Hip arthroplasty    . Ankle surgery     . Hemorrhoid surgery     Family History  Problem Relation Age of Onset  . Colon cancer Neg Hx    History  Substance Use Topics  . Smoking status: Former Smoker -- 3.00 packs/day for 20 years    Types: Cigarettes    Quit date: 05/26/1975  . Smokeless tobacco: Former Systems developer    Types: Chew    Quit date: 01/04/2000  . Alcohol Use: No    Review of Systems Ten systems reviewed and are negative for acute change, except as noted in the HPI.   Allergies  Review of patient's allergies indicates no known allergies.  Home Medications   Current Outpatient Rx  Name  Route  Sig  Dispense  Refill  . acetaminophen (TYLENOL) 500 MG tablet   Oral   Take 1,000 mg by mouth every 4 (four) hours as needed for mild pain, moderate pain, fever or headache.         . albuterol (PROAIR HFA) 108 (90 BASE) MCG/ACT inhaler   Inhalation   Inhale 2 puffs into the lungs every 6 (six) hours as needed for wheezing or shortness of breath.   1 Inhaler   2   . aspirin 325 MG tablet   Oral   Take 325 mg by mouth daily.           Marland Kitchen atenolol (TENORMIN) 50 MG tablet      TAKE ONE TABLET BY MOUTH ONE TIME DAILY   90 tablet   3   . DM-Doxylamine-Acetaminophen (NYQUIL COLD & FLU PO)   Oral   Take 2 capsules by mouth at bedtime.         Marland Kitchen  guaifenesin (ROBITUSSIN) 100 MG/5ML syrup   Oral   Take 400 mg by mouth 3 (three) times daily as needed for cough or congestion.         Marland Kitchen HYDROcodone-acetaminophen (NORCO) 7.5-325 MG per tablet   Oral   Take 1 tablet by mouth 3 (three) times daily as needed.   90 tablet   0   . levothyroxine (SYNTHROID, LEVOTHROID) 175 MCG tablet   Oral   Take 1 tablet (175 mcg total) by mouth daily.   30 tablet   11   . lisinopril-hydrochlorothiazide (PRINZIDE,ZESTORETIC) 20-25 MG per tablet   Oral   Take 1 tablet by mouth daily.   30 tablet   11   . nitroGLYCERIN (NITROSTAT) 0.4 MG SL tablet   Sublingual   Place 0.4 mg under the tongue every 5 (five) minutes  as needed.           . simvastatin (ZOCOR) 40 MG tablet      TAKE ONE TABLET BY MOUTH AT BEDTIME   90 tablet   3   . tiotropium (SPIRIVA HANDIHALER) 18 MCG inhalation capsule   Inhalation   Place 1 capsule (18 mcg total) into inhaler and inhale daily.   30 capsule   2    BP 104/66  Pulse 61  Temp(Src) 97.8 F (36.6 C) (Oral)  Resp 18  Ht 6' (1.829 m)  Wt 260 lb (117.935 kg)  BMI 35.25 kg/m2  SpO2 96% Physical Exam Physical Exam  Nursing note and vitals reviewed. Constitutional: He appears well-developed and well-nourished. No distress.  HENT:  Head: Normocephalic and atraumatic.  Eyes: Conjunctivae normal are normal. No scleral icterus.  Neck: Normal range of motion. Neck supple.  Cardiovascular: Normal rate, regular rhythm and normal heart sounds.   Pulmonary/Chest: Effort normal and breath sounds normal. No respiratory distress.  Abdominal: Soft. There is no tenderness.  Musculoskeletal: He exhibits no edema.  Neurological: He is alert.  Skin: Skin is warm and dry. He is not diaphoretic.  Psychiatric: His behavior is normal.    ED Course  Procedures (including critical care time) Labs Review Labs Reviewed  POCT I-STAT, CHEM 8 - Abnormal; Notable for the following:    Glucose, Bld 110 (*)    All other components within normal limits  CBC  PROTIME-INR  HEPARIN LEVEL (UNFRACTIONATED)  PROTIME-INR  POCT I-STAT TROPONIN I   Imaging Review Dg Chest Portable 1 View  01/15/2013   CLINICAL DATA:  Chest pain  EXAM: PORTABLE CHEST - 1 VIEW  COMPARISON:  12/06/2012  FINDINGS: Chronic interstitial coarsening at the bases, accentuated by lower lung volumes. No cardiomegaly. Tortuous aorta. No infiltrate or edema. No effusion or pneumothorax.  IMPRESSION: No active disease.   Electronically Signed   By: Jorje Guild M.D.   On: 01/15/2013 05:35    EKG Interpretation    Date/Time:  Tuesday January 15 2013 05:12:47 EST Ventricular Rate:  76 PR Interval:  177 QRS  Duration: 99 QT Interval:  364 QTC Calculation: 409 R Axis:   -42 Text Interpretation:  Sinus rhythm Nonspecific ST and T wave abnormality Confirmed by OPITZ  MD, [place] [phone]) on 01/15/2013 6:19:46 AM            CRITICAL CARE Performed by: Margarita Mail   Total critical care time: 30  Critical care time was exclusive of separately billable procedures and treating other patients.  Critical care was necessary to treat or prevent imminent or life-threatening deterioration.  Critical care was time  spent personally by me on the following activities: development of treatment plan with patient and/or surrogate as well as nursing, discussions with consultants, evaluation of patient's response to treatment, examination of patient, obtaining history from patient or surrogate, ordering and performing treatments and interventions, ordering and review of laboratory studies, ordering and review of radiographic studies, pulse oximetry and re-evaluation of patient's condition.   MDM   1. NSTEMI (non-ST elevated myocardial infarction)   2. Intermediate coronary syndrome   3. Coronary atherosclerosis of unspecified type of vessel, native or graft    BP 104/66  Pulse 61  Temp(Src) 97.8 F (36.6 C) (Oral)  Resp 18  Ht 6' (1.829 m)  Wt 260 lb (117.935 kg)  BMI 35.25 kg/m2  SpO32 83% 68 year old male with significant risk factors for acute coronary syndrome.  Patient's initial troponin is negative however he does have new T wave inversions in the inferior leads.  Along with his history I feel that the patient is likely having an STEMI.  Patient will be started on heparin.  Patient's chest x-ray shows some chronic bilateral interstitial coarsening secondary to his COPD.  I personally reviewed these images and route cream with radiologic interpretation.  He had mild hyperglycemia and labs are otherwise within normal limits.  Pressures taken in bilateral upper extremities are equal.  I do not  suspect dissecting aortic aneurysm. Patient reports he had a normal stress test approximately 6 weeks ago.  He is followed by Dr. Martinique with cardiology.   Patient accepted by Dr. Burt Knack for admission. Negative troponin.  The patient appears reasonably stabilized for admission considering the current resources, flow, and capabilities available in the ED at this time, and I doubt any other [facility] requiring further screening and/or treatment in the ED prior to admission.   Margarita Mail, PA-C 01/15/13 2158

## 2013-01-15 NOTE — Progress Notes (Signed)
TR BAND REMOVAL  LOCATION:    right radial  DEFLATED PER PROTOCOL:    yes  TIME BAND OFF / DRESSING APPLIED:    1310   SITE UPON ARRIVAL:    Level 0  SITE AFTER BAND REMOVAL:    Level 0  REVERSE ALLEN'S TEST:     positive  CIRCULATION SENSATION AND MOVEMENT:    Within Normal Limits   yes  COMMENTS:   Tolerated procedure well

## 2013-01-15 NOTE — ED Provider Notes (Signed)
Medical screening examination/treatment/procedure(s) were performed by non-physician practitioner and as supervising physician I was immediately available for consultation/collaboration.  EKG Interpretation    Date/Time:  Tuesday January 15 2013 05:12:47 EST Ventricular Rate:  76 PR Interval:  177 QRS Duration: 99 QT Interval:  364 QTC Calculation: 409 R Axis:   -42 Text Interpretation:  Sinus rhythm Nonspecific ST and T wave abnormality Confirmed by Aliyana Dlugosz  MD, Yedidya Duddy [phone]) on 01/15/2013 6:19:46 AM             Teressa Lower, MD 01/15/13 2239

## 2013-01-15 NOTE — Discharge Summary (Signed)
Patient ID: [name],  MRN: [id number], DOB/AGE: [birth year] 68 y.o.  Admit date: 01/15/2013 Discharge date: 01/15/2013  Primary Care Provider: Dr Jacelyn Grip [facility]) Primary Cardiologist: Dr P. Martinique  Discharge Diagnoses Principal Problem:   Chest pain with moderate risk of acute coronary syndrome- cath [facility] 01/15/13 Active Problems:   CAD (coronary artery disease)- prior CFX stenting   HYPERLIPIDEMIA   HYPERTENSION   COPD    Procedures: Radial cath 01/15/13   Hospital Course:   68 year old male with a history of coronary artery disease.  He is followed by Dr. Martinique. He has had previous PCI of the circumflex and third obtuse marginal. His last catheterization in 2010 showed a 50% circumflex and a 60% posterior lateral branch. There was a 70% right coronary artery. Ejection fraction was 60%. Nuclear study in October 2014 showed lateral infarct versus soft tissue attenuation and no ischemia. Ejection fraction 59%. Patient awoke at 2:30 AM 01/15/13 with palpitations followed by substernal chest pain described as a crushing sensation. It radiated to his neck, jaw and back. Similar to his previous cardiac pain. There was nausea, diaphoresis and dyspnea. His symptoms did not resolve and he presented to the emergency room. His Troponin was negative. Radial cath done 1/13/1 revealed widely patent coronary arteries without evidence of circumflex and obtuse marginal in-stent restenosis or thrombus. It was felt he could be discharged after his cath. He is pain free. He will call for an appointment to see Dr Martinique in 2-3 weeks for follow up.     Discharge Vitals:  Blood pressure 106/74, pulse 56, temperature 98.2 F (36.8 C), temperature source Oral, resp. rate 16, height 6' (1.829 m), weight 260 lb (117.935 kg), SpO2 95.00%.    Labs: Results for orders placed during the hospital encounter of 01/15/13 (from the past 48 hour(s))  CBC     Status: None   Collection Time    01/15/13  5:20 AM   Result Value Range   WBC 6.0  4.0 - 10.5 K/uL   RBC 4.56  4.22 - 5.81 MIL/uL   Hemoglobin 14.4  13.0 - 17.0 g/dL   HCT 41.0  39.0 - 52.0 %   MCV 89.9  78.0 - 100.0 fL   MCH 31.6  26.0 - 34.0 pg   MCHC 35.1  30.0 - 36.0 g/dL   RDW 13.7  11.5 - 15.5 %   Platelets 203  150 - 400 K/uL  POCT I-STAT TROPONIN I     Status: None   Collection Time    01/15/13  5:23 AM      Result Value Range   Troponin i, poc 0.01  0.00 - 0.08 ng/mL   Comment 3            Comment: Due to the release kinetics of cTnI,     a negative result within the first hours     of the onset of symptoms does not rule out     myocardial infarction with certainty.     If myocardial infarction is still suspected,     repeat the test at appropriate intervals.  POCT I-STAT, CHEM 8     Status: Abnormal   Collection Time    01/15/13  5:26 AM      Result Value Range   Sodium 140  137 - 147 mEq/L   Potassium 3.9  3.7 - 5.3 mEq/L   Chloride 106  96 - 112 mEq/L   BUN 19  6 - 23 mg/dL  Creatinine, Ser 1.20  0.50 - 1.35 mg/dL   Glucose, Bld 110 (*) 70 - 99 mg/dL   Calcium, Ion 1.18  1.13 - 1.30 mmol/L   TCO2 24  0 - 100 mmol/L   Hemoglobin 14.3  13.0 - 17.0 g/dL   HCT 42.0  39.0 - 52.0 %  PROTIME-INR     Status: None   Collection Time    01/15/13  6:24 AM      Result Value Range   Prothrombin Time 12.8  11.6 - 15.2 seconds   INR 0.98  0.00 - 1.49  TROPONIN I     Status: None   Collection Time    01/15/13  2:28 PM      Result Value Range   Troponin I <0.30  <0.30 ng/mL   Comment:            Due to the release kinetics of cTnI,     a negative result within the first hours     of the onset of symptoms does not rule out     myocardial infarction with certainty.     If myocardial infarction is still suspected,     repeat the test at appropriate intervals.    Disposition:      Follow-up Information   Follow up with Peter Martinique, MD. (call office for an appointment in 2-3 weeks.)    Specialty:  Cardiology    Contact information:   [address] [place] [address] [phone]       Discharge Medications:    Medication List         acetaminophen 500 MG tablet  Commonly known as:  TYLENOL  Take 1,000 mg by mouth every 4 (four) hours as needed for mild pain, moderate pain, fever or headache.     albuterol 108 (90 BASE) MCG/ACT inhaler  Commonly known as:  PROAIR HFA  Inhale 2 puffs into the lungs every 6 (six) hours as needed for wheezing or shortness of breath.     aspirin 325 MG tablet  Take 325 mg by mouth daily.     atenolol 50 MG tablet  Commonly known as:  TENORMIN  TAKE ONE TABLET BY MOUTH ONE TIME DAILY     guaifenesin 100 MG/5ML syrup  Commonly known as:  ROBITUSSIN  Take 400 mg by mouth 3 (three) times daily as needed for cough or congestion.     HYDROcodone-acetaminophen 7.5-325 MG per tablet  Commonly known as:  NORCO  Take 1 tablet by mouth 3 (three) times daily as needed.     levothyroxine 175 MCG tablet  Commonly known as:  SYNTHROID, LEVOTHROID  Take 1 tablet (175 mcg total) by mouth daily.     lisinopril-hydrochlorothiazide 20-25 MG per tablet  Commonly known as:  PRINZIDE,ZESTORETIC  Take 1 tablet by mouth daily.     nitroGLYCERIN 0.4 MG SL tablet  Commonly known as:  NITROSTAT  Place 0.4 mg under the tongue every 5 (five) minutes as needed.     NYQUIL COLD & FLU PO  Take 2 capsules by mouth at bedtime.     simvastatin 40 MG tablet  Commonly known as:  ZOCOR  TAKE ONE TABLET BY MOUTH AT BEDTIME     tiotropium 18 MCG inhalation capsule  Commonly known as:  SPIRIVA HANDIHALER  Place 1 capsule (18 mcg total) into inhaler and inhale daily.         Duration of Discharge Encounter: Greater than 30 minutes including  physician time.  Angelena Form PA-C 01/15/2013 5:14 PM

## 2013-01-16 NOTE — Discharge Summary (Signed)
Patient evaluated and felt eligible for discharge. He had no significant obstruction and negative markers. If this was ischemic pain , it was probably due to spasm. To f/u with Dr. Martinique within 4 weeks.

## 2013-02-07 ENCOUNTER — Other Ambulatory Visit: Payer: Self-pay | Admitting: Family Medicine

## 2013-02-07 ENCOUNTER — Telehealth: Payer: Self-pay | Admitting: Family Medicine

## 2013-02-07 DIAGNOSIS — M1710 Unilateral primary osteoarthritis, unspecified knee: Secondary | ICD-10-CM

## 2013-02-07 MED ORDER — HYDROCODONE-ACETAMINOPHEN 7.5-325 MG PO TABS: 1.0000 | ORAL_TABLET | Freq: Three times a day (TID) | ORAL | Status: DC | PRN

## 2013-02-07 NOTE — Telephone Encounter (Signed)
Pt aware to pick up at front desk

## 2013-02-07 NOTE — Telephone Encounter (Signed)
Rx ready for pick up. 

## 2013-02-07 NOTE — Telephone Encounter (Signed)
Last seen and filled 12/24/13

## 2013-02-27 ENCOUNTER — Telehealth: Payer: Self-pay | Admitting: Family Medicine

## 2013-03-12 ENCOUNTER — Telehealth: Payer: Self-pay | Admitting: Family Medicine

## 2013-03-14 ENCOUNTER — Other Ambulatory Visit: Payer: Self-pay | Admitting: Family Medicine

## 2013-03-14 DIAGNOSIS — M1710 Unilateral primary osteoarthritis, unspecified knee: Secondary | ICD-10-CM

## 2013-03-14 MED ORDER — HYDROCODONE-ACETAMINOPHEN 7.5-325 MG PO TABS: 1.0000 | ORAL_TABLET | Freq: Three times a day (TID) | ORAL | Status: DC | PRN

## 2013-03-14 NOTE — Telephone Encounter (Signed)
Rx ready for pick up. 

## 2013-03-14 NOTE — Telephone Encounter (Signed)
Pt notified rx ready for pick up.

## 2013-03-15 ENCOUNTER — Other Ambulatory Visit: Payer: Self-pay | Admitting: Family Medicine

## 2013-03-15 DIAGNOSIS — Z1211 Encounter for screening for malignant neoplasm of colon: Secondary | ICD-10-CM

## 2013-03-15 NOTE — Telephone Encounter (Signed)
Referral done in [facility].

## 2013-03-15 NOTE — Telephone Encounter (Signed)
Pt aware referral initiated and to call back next Wednesday if not heard back from office

## 2013-03-26 ENCOUNTER — Encounter (INDEPENDENT_AMBULATORY_CARE_PROVIDER_SITE_OTHER): Payer: Self-pay | Admitting: *Deleted

## 2013-04-04 ENCOUNTER — Encounter: Payer: Self-pay | Admitting: Family Medicine

## 2013-04-04 ENCOUNTER — Ambulatory Visit (INDEPENDENT_AMBULATORY_CARE_PROVIDER_SITE_OTHER): Payer: Medicare HMO | Admitting: Family Medicine

## 2013-04-04 VITALS — BP 103/68 | HR 57 | Temp 97.1°F | Ht 72.0 in | Wt 242.0 lb

## 2013-04-04 DIAGNOSIS — K219 Gastro-esophageal reflux disease without esophagitis: Secondary | ICD-10-CM

## 2013-04-04 DIAGNOSIS — Z23 Encounter for immunization: Secondary | ICD-10-CM | POA: Insufficient documentation

## 2013-04-04 DIAGNOSIS — E785 Hyperlipidemia, unspecified: Secondary | ICD-10-CM

## 2013-04-04 DIAGNOSIS — IMO0002 Reserved for concepts with insufficient information to code with codable children: Secondary | ICD-10-CM

## 2013-04-04 DIAGNOSIS — M1710 Unilateral primary osteoarthritis, unspecified knee: Secondary | ICD-10-CM

## 2013-04-04 DIAGNOSIS — G4730 Sleep apnea, unspecified: Secondary | ICD-10-CM

## 2013-04-04 DIAGNOSIS — J4489 Other specified chronic obstructive pulmonary disease: Secondary | ICD-10-CM

## 2013-04-04 DIAGNOSIS — I10 Essential (primary) hypertension: Secondary | ICD-10-CM

## 2013-04-04 DIAGNOSIS — I2510 Atherosclerotic heart disease of native coronary artery without angina pectoris: Secondary | ICD-10-CM

## 2013-04-04 DIAGNOSIS — E079 Disorder of thyroid, unspecified: Secondary | ICD-10-CM

## 2013-04-04 DIAGNOSIS — J449 Chronic obstructive pulmonary disease, unspecified: Secondary | ICD-10-CM

## 2013-04-04 MED ORDER — HYDROCODONE-ACETAMINOPHEN 7.5-325 MG PO TABS: 1.0000 | ORAL_TABLET | Freq: Three times a day (TID) | ORAL | Status: DC | PRN

## 2013-04-04 NOTE — Progress Notes (Signed)
Patient ID: [name], male   DOB: 02-12-1945, 68 y.o.   MRN: [id number] SUBJECTIVE: CC: Chief Complaint  Patient presents with  . Hypothyroidism    recheck  . Hypertension  . Hyperlipidemia    HPI: Patient is here for follow up of hyperlipidemia/HTN/CAD/COPD: denies Headache;denies Chest Pain;denies weakness;denies Shortness of Breath and orthopnea;denies Visual changes;denies palpitations;denies cough;denies pedal edema;denies symptoms of TIA or stroke;deniesClaudication symptoms. admits to Compliance with medications; denies Problems with medications. Had cardiac cath in [place] for chest pain  Also having pains with meals. GI seen already and has him scheduled 4/6 for EGD and colonoscope. No change in BMs. Trying to lose weight lost 22 lb so far. Not eating enough fruits and vegetable.   Past Medical History  Diagnosis Date  . Coronary atherosclerosis of unspecified type of vessel, native or graft   . Unspecified essential hypertension   . Other and unspecified hyperlipidemia   . Unspecified sleep apnea   . Chronic airway obstruction, not elsewhere classified   . Arthropathy, unspecified, site unspecified   . Thyroid disease     hypothyroid  . Anginal pain   . Hypothyroidism   . Shortness of breath   . Headache(784.0)   . Arthritis     OSTEO BACK KNEES HIPS & HANDS   Past Surgical History  Procedure Laterality Date  . Coronary stent placement    . Hip arthroplasty    . Ankle surgery    . Hemorrhoid surgery    . Tonsillectomy    . Cardiac catheterization  01/15/2013   History   Social History  . Marital Status: Married    Spouse Name: N/A    Number of Children: 2  . Years of Education: N/A   Occupational History  . 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 Smoking status: Former Smoker -- 3.00 packs/day for 20 years    Types: Cigarettes    Quit date: 05/26/1975  . Smokeless tobacco: Former Systems developer    Types: Chew    Quit date:  01/04/2000  . Alcohol Use: No  . Drug Use: No  . Sexual Activity: Not on file   Other Topics Concern  . Not on file   Social History Narrative   Occupation:[employer] Administrator, [employer]   Married 2 children one boy,one girl.   Family History  Problem Relation Age of Onset  . Colon cancer Neg Hx   . CAD Father     MI at age 76  . Stroke Mother    Current Outpatient Prescriptions on File Prior to Visit  Medication Sig Dispense Refill  . acetaminophen (TYLENOL) 500 MG tablet Take 1,000 mg by mouth every 4 (four) hours as needed for mild pain, moderate pain, fever or headache.      . albuterol (PROAIR HFA) 108 (90 BASE) MCG/ACT inhaler Inhale 2 puffs into the lungs every 6 (six) hours as needed for wheezing or shortness of breath.  1 Inhaler  2  . aspirin 325 MG tablet Take 325 mg by mouth daily.        Marland Kitchen atenolol (TENORMIN) 50 MG tablet TAKE ONE TABLET BY MOUTH ONE TIME DAILY  90 tablet  3  . levothyroxine (SYNTHROID, LEVOTHROID) 175 MCG tablet Take 1 tablet (175 mcg total) by mouth daily.  30 tablet  11  . lisinopril-hydrochlorothiazide (PRINZIDE,ZESTORETIC) 20-25 MG per tablet Take 1 tablet by mouth daily.  30 tablet  11  . nitroGLYCERIN (NITROSTAT) 0.4 MG SL tablet Place  0.4 mg under the tongue every 5 (five) minutes as needed.        . simvastatin (ZOCOR) 40 MG tablet TAKE ONE TABLET BY MOUTH AT BEDTIME  90 tablet  3  . tiotropium (SPIRIVA HANDIHALER) 18 MCG inhalation capsule Place 1 capsule (18 mcg total) into inhaler and inhale daily.  30 capsule  2   No current facility-administered medications on file prior to visit.   No Known Allergies Immunization History  Administered Date(s) Administered  . Influenza Split 10/06/2012  . Pneumococcal Conjugate-13 12/24/2012  . Tdap 04/04/2013   Prior to Admission medications   Medication Sig Start Date End Date Taking? Authorizing Provider  acetaminophen (TYLENOL) 500 MG tablet Take 1,000 mg by mouth every 4 (four) hours as needed for mild  pain, moderate pain, fever or headache.   Yes Historical Provider, MD  albuterol (PROAIR HFA) 108 (90 BASE) MCG/ACT inhaler Inhale 2 puffs into the lungs every 6 (six) hours as needed for wheezing or shortness of breath. 12/06/12  Yes Juanito Doom, MD  aspirin 325 MG tablet Take 325 mg by mouth daily.     Yes Historical Provider, MD  atenolol (TENORMIN) 50 MG tablet TAKE ONE TABLET BY MOUTH ONE TIME DAILY 08/29/12  Yes Vernie Shanks, MD  HYDROcodone-acetaminophen (NORCO) 7.5-325 MG per tablet Take 1 tablet by mouth 3 (three) times daily as needed. 03/14/13  Yes Vernie Shanks, MD  levothyroxine (SYNTHROID, LEVOTHROID) 175 MCG tablet Take 1 tablet (175 mcg total) by mouth daily. 08/29/12  Yes Vernie Shanks, MD  lisinopril-hydrochlorothiazide (PRINZIDE,ZESTORETIC) 20-25 MG per tablet Take 1 tablet by mouth daily. 08/29/12 08/29/13 Yes Vernie Shanks, MD  nitroGLYCERIN (NITROSTAT) 0.4 MG SL tablet Place 0.4 mg under the tongue every 5 (five) minutes as needed.     Yes Historical Provider, MD  simvastatin (ZOCOR) 40 MG tablet TAKE ONE TABLET BY MOUTH AT BEDTIME 08/29/12  Yes Vernie Shanks, MD  tiotropium (SPIRIVA HANDIHALER) 18 MCG inhalation capsule Place 1 capsule (18 mcg total) into inhaler and inhale daily. 12/06/12 12/06/13 Yes Juanito Doom, MD     ROS: As above in the HPI. All other systems are stable or negative.  OBJECTIVE: APPEARANCE:  Patient in no acute distress.The patient appeared well nourished and normally developed. Acyanotic. Waist: VITAL SIGNS:BP 103/68  Pulse 57  Temp(Src) 97.1 F (36.2 C) (Oral)  Ht 6' (1.829 m)  Wt 242 lb (109.77 kg)  BMI 32.81 kg/m2 Obese WM  SKIN: warm and  Dry without overt rashes, tattoos and scars  HEAD and Neck: without JVD, Head and scalp: normal Eyes:No scleral icterus. Fundi normal, eye movements normal. Ears: Auricle normal, canal normal, Tympanic membranes normal, insufflation normal. Nose: normal Throat: normal Neck & thyroid:  normal  CHEST & LUNGS: Chest wall: normal Lungs: Clear  CVS: Reveals the PMI to be normally located. Regular rhythm, First and Second Heart sounds are normal,  absence of murmurs, rubs or gallops. Peripheral vasculature: Radial pulses: normal Dorsal pedis pulses: normal Posterior pulses: normal  ABDOMEN:  Appearance: obese Benign, no organomegaly, no masses, no Abdominal Aortic enlargement. No Guarding , no rebound. No Bruits. Bowel sounds: normal  RECTAL: N/A GU: N/A  EXTREMETIES: nonedematous.  MUSCULOSKELETAL:  Spine: normal Joints: intact  NEUROLOGIC: oriented to time,place and person; nonfocal. Strength is normal Sensory is normal Reflexes are normal Cranial Nerves are normal.  Results for orders placed during the hospital encounter of 01/15/13  CBC      Result  Value Ref Range   WBC 6.0  4.0 - 10.5 K/uL   RBC 4.56  4.22 - 5.81 MIL/uL   Hemoglobin 14.4  13.0 - 17.0 g/dL   HCT 41.0  39.0 - 52.0 %   MCV 89.9  78.0 - 100.0 fL   MCH 31.6  26.0 - 34.0 pg   MCHC 35.1  30.0 - 36.0 g/dL   RDW 13.7  11.5 - 15.5 %   Platelets 203  150 - 400 K/uL  PROTIME-INR      Result Value Ref Range   Prothrombin Time 12.8  11.6 - 15.2 seconds   INR 0.98  0.00 - 1.49  TROPONIN I      Result Value Ref Range   Troponin I <0.30  <0.30 ng/mL  POCT I-STAT, CHEM 8      Result Value Ref Range   Sodium 140  137 - 147 mEq/L   Potassium 3.9  3.7 - 5.3 mEq/L   Chloride 106  96 - 112 mEq/L   BUN 19  6 - 23 mg/dL   Creatinine, Ser 1.20  0.50 - 1.35 mg/dL   Glucose, Bld 110 (*) 70 - 99 mg/dL   Calcium, Ion 1.18  1.13 - 1.30 mmol/L   TCO2 24  0 - 100 mmol/L   Hemoglobin 14.3  13.0 - 17.0 g/dL   HCT 42.0  39.0 - 52.0 %  POCT I-STAT TROPONIN I      Result Value Ref Range   Troponin i, poc 0.01  0.00 - 0.08 ng/mL   Comment 3             ASSESSMENT:  HYPERLIPIDEMIA - Plan: NMR, lipoprofile  HYPERTENSION - Plan: CMP14+EGFR  SLEEP APNEA - not yet formally evaluated  Need for  diphtheria-tetanus-pertussis (Tdap) vaccine, adult/adolescent - Plan: Tdap vaccine greater than or equal to 7yo IM  COPD  CAD (coronary artery disease)- prior CFX stenting  Thyroid disease - Plan: TSH  GERD  Arthritis of knee - Plan: HYDROcodone-acetaminophen (NORCO) 7.5-325 MG per tablet Sleep apnea observed in the hospital setting but he never had formal sleep studies. He declines until after he sees the GI and has his work up next week.  PLAN:  Obesity, HTN, CAD, information in the AVS given      Dr Paula Libra Recommendations  For nutrition information, I recommend books:  1).Eat to Live by Dr Excell Seltzer. 2).Prevent and Reverse Heart Disease by Dr Karl Luke. 3) Dr Janene Harvey Book:  Program to Reverse Diabetes  Exercise recommendations are:  If unable to walk, then the patient can exercise in a chair 3 times a day. By flapping arms like a bird gently and raising legs outwards to the front.  If ambulatory, the patient can go for walks for 30 minutes 3 times a week. Then increase the intensity and duration as tolerated.  Goal is to try to attain exercise frequency to 5 times a week.  If applicable: Best to perform resistance exercises (machines or weights) 2 days a week and cardio type exercises 3 days per week.  Orders Placed This Encounter  Procedures  . Tdap vaccine greater than or equal to 7yo IM    boostrix  . CMP14+EGFR  . NMR, lipoprofile  . TSH   Meds ordered this encounter  Medications  . HYDROcodone-acetaminophen (NORCO) 7.5-325 MG per tablet    Sig: Take 1 tablet by mouth 3 (three) times daily as needed.    Dispense:  90 tablet  Refill:  0   Medications Discontinued During This Encounter  Medication Reason  . DM-Doxylamine-Acetaminophen (NYQUIL COLD & FLU PO) Completed Course  . guaifenesin (ROBITUSSIN) 100 MG/5ML syrup Completed Course  . HYDROcodone-acetaminophen (NORCO) 7.5-325 MG per tablet Reorder   Discussed that he will  need Sleep study in the near future.  Return in about 3 months (around 07/04/2013) for Recheck medical problems.  Farren Landa P. Jacelyn Grip, M.D.

## 2013-04-04 NOTE — Patient Instructions (Addendum)
Obesity Obesity is defined as having too much total body fat and a body mass index (BMI) of 30 or more. BMI is an estimate of body fat and is calculated from your height and weight. Obesity happens when you consume more calories than you can burn by exercising or performing daily physical tasks. Prolonged obesity can cause major illnesses or emergencies, such as:   A stroke.  Heart disease.  Diabetes.  Cancer.  Arthritis.  High blood pressure (hypertension).  High cholesterol.  Sleep apnea.  Erectile dysfunction.  Infertility problems. CAUSES   Regularly eating unhealthy foods.  Physical inactivity.  Certain disorders, such as an underactive thyroid (hypothyroidism), Cushing's syndrome, and polycystic ovarian syndrome.  Certain medicines, such as steroids, some depression medicines, and antipsychotics.  Genetics.  Lack of sleep. DIAGNOSIS  A caregiver can diagnose obesity after calculating your BMI. Obesity will be diagnosed if your BMI is 30 or higher.  There are other methods of measuring obesity levels. Some other methods include measuring your skin fold thickness, your waist circumference, and comparing your hip circumference to your waist circumference. TREATMENT  A healthy treatment program includes some or all of the following:  Long-term dietary changes.  Exercise and physical activity.  Behavioral and lifestyle changes.  Medicine only under the supervision of your caregiver. Medicines may help, but only if they are used with diet and exercise programs. An unhealthy treatment program includes:  Fasting.  Fad diets.  Supplements and drugs. These choices do not succeed in long-term weight control.  HOME CARE INSTRUCTIONS   Exercise and perform physical activity as directed by your caregiver. To increase physical activity, try the following:  Use stairs instead of elevators.  Park farther away from store entrances.  Garden, bike, or walk instead of  watching television or using the computer.  Eat healthy, low-calorie foods and drinks on a regular basis. Eat more fruits and vegetables. Use low-calorie cookbooks or take healthy cooking classes.  Limit fast food, sweets, and processed snack foods.  Eat smaller portions.  Keep a daily journal of everything you eat. There are many free websites to help you with this. It may be helpful to measure your foods so you can determine if you are eating the correct portion sizes.  Avoid drinking alcohol. Drink more water and drinks without calories.  Take vitamins and supplements only as recommended by your caregiver.  Weight-loss support groups, Nurse, mental health, counselors, and stress reduction education can also be very helpful. SEEK IMMEDIATE MEDICAL CARE IF:  You have chest pain or tightness.  You have trouble breathing or feel short of breath.  You have weakness or leg numbness.  You feel confused or have trouble talking.  You have sudden changes in your vision. MAKE SURE YOU:  Understand these instructions.  Will watch your condition.  Will get help right away if you are not doing well or get worse. Document Released: 01/28/2004 Document Revised: 06/21/2011 Document Reviewed: 01/26/2011 [facility] Patient Information 2014 [place].   Coronary Artery Disease, Risk Factors Research has shown that the risk of developing coronary artery disease (CAD) and having a heart attack increases with each factor you have. RISK FACTORS YOU CANNOT CHANGE  Your age. Your risk goes up as you get older. Most heart attacks happen to people over the age of 33.  Gender. Men have a greater risk of heart attack than women, and they have attacks earlier in life. However, women are more likely to die from a heart attack.  Heredity. Children of parents with heart disease are more likely to develop it themselves.  Race. African Americans and other ethnic groups have a higher risk, possibly  because of high blood pressure, a tendency toward obesity, and diabetes.  Your family. Most people with a strong family history of heart disease have one or more other risk factors. RISK FACTORS YOU CAN CHANGE  Exposure to tobacco smoke. Even secondhand smoke greatly increases the risk for heart disease.  High blood cholesterol may be lowered with changes in diet, activity, and medicines.  High blood pressure makes the heart work harder. This causes the heart muscles to become thick and, eventually, weaker. It also increases your risk of stroke, heart attack, and kidney or heart failure.  Physical inactivity is a risk factor for CAD. Regular physical activity helps prevent heart and blood vessel disease. Exercise helps control blood cholesterol, diabetes, obesity, and it may help lower blood pressure in some people.  Excess body fat, especially belly fat, increases the risk of heart disease and stroke even if there are no other risk factors. Excess weight increases the heart's workload and raises blood pressure and blood cholesterol.  Diabetes seriously increases your risk of developing CAD. If you have diabetes, you should work with your caregiver to manage it and control other risk factors. OTHER RISK FACTORS FOR CAD  How you respond to stress.  Drinking too much alcohol may raise blood pressure, cause heart failure, and lead to stroke.  Total cholesterol greater than 200 milligrams.  HDL (good) cholesterol less than 40 milligrams. HDL helps keep cholesterol from building up in the walls of the arteries. PREVENTING CAD  Maintain a healthy weight.  Exercise or do physical activity.  Eat a heart-healthy diet low in fat and salt and high in fiber.  Control your blood pressure to keep it below 120 over 80.  Keep your cholesterol at a level that lowers your risk.  Manage diabetes if you have it.  Stop smoking.  Learn how to manage stress. HEART SMART SUBSTITUTIONS  Instead of  whole or 2% milk and cream, use skim milk.  Instead of fried foods, eat baked, steamed, boiled, broiled, or microwaved foods.  Instead of lard, butter, palm and coconut oils, cook with unsaturated vegetable oils, such as corn, olive, canola, safflower, sesame, soybean, sunflower, or peanut.  Instead of fatty cuts of meat, eat lean cuts of meat or cut off the fatty parts.  Instead of 1 whole egg in recipes, use 2 egg whites.  Instead of sauces, butter, and salt, season vegetables with herbs and spices.  Instead of regular hard and processed cheeses, eat low-fat, low-sodium cheeses.  Instead of salted potato chips, choose low-fat, unsalted tortilla and potato chips and unsalted pretzels and popcorn.  Instead of sour cream and mayonnaise, use plain low-fat yogurt, low-fat cottage cheese, or low-fat or "light" sour cream. FOR MORE INFORMATION  National Heart Lung and Blood Institute: http://www.ball-ray.net/ American Heart Association: weddingcandid.com Document Released: 03/12/2003 Document Revised: 03/14/2011 Document Reviewed: 03/07/2007 [employer] Patient Information 2014 [place], [place].   Hypertension As your heart beats, it forces blood through your arteries. This force is your blood pressure. If the pressure is too high, it is called hypertension (HTN) or high blood pressure. HTN is dangerous because you may have it and not know it. High blood pressure may mean that your heart has to work harder to pump blood. Your arteries may be narrow or stiff. The extra work puts you at risk for  heart disease, stroke, and other problems.  Blood pressure consists of two numbers, a higher number over a lower, 110/72, for example. It is stated as "110 over 72." The ideal is below 120 for the top number (systolic) and under 80 for the bottom (diastolic). Write down your blood pressure today. You should pay close attention to your blood pressure if you have certain conditions such  as:  Heart failure.  Prior heart attack.  Diabetes  Chronic kidney disease.  Prior stroke.  Multiple risk factors for heart disease. To see if you have HTN, your blood pressure should be measured while you are seated with your arm held at the level of the heart. It should be measured at least twice. A one-time elevated blood pressure reading (especially in the Emergency Department) does not mean that you need treatment. There may be conditions in which the blood pressure is different between your right and left arms. It is important to see your caregiver soon for a recheck. Most people have essential hypertension which means that there is not a specific cause. This type of high blood pressure may be lowered by changing lifestyle factors such as:  Stress.  Smoking.  Lack of exercise.  Excessive weight.  Drug/tobacco/alcohol use.  Eating less salt. Most people do not have symptoms from high blood pressure until it has caused damage to the body. Effective treatment can often prevent, delay or reduce that damage. TREATMENT  When a cause has been identified, treatment for high blood pressure is directed at the cause. There are a large number of medications to treat HTN. These fall into several categories, and your caregiver will help you select the medicines that are best for you. Medications may have side effects. You should review side effects with your caregiver. If your blood pressure stays high after you have made lifestyle changes or started on medicines,   Your medication(s) may need to be changed.  Other problems may need to be addressed.  Be certain you understand your prescriptions, and know how and when to take your medicine.  Be sure to follow up with your caregiver within the time frame advised (usually within two weeks) to have your blood pressure rechecked and to review your medications.  If you are taking more than one medicine to lower your blood pressure, make  sure you know how and at what times they should be taken. Taking two medicines at the same time can result in blood pressure that is too low. SEEK IMMEDIATE MEDICAL CARE IF:  You develop a severe headache, blurred or changing vision, or confusion.  You have unusual weakness or numbness, or a faint feeling.  You have severe chest or abdominal pain, vomiting, or breathing problems. MAKE SURE YOU:   Understand these instructions.  Will watch your condition.  Will get help right away if you are not doing well or get worse. Document Released: 12/20/2004 Document Revised: 03/14/2011 Document Reviewed: 08/10/2007 [facility] Patient Information 2014 [place].   DASH Diet The DASH diet stands for "Dietary Approaches to Stop Hypertension." It is a healthy eating plan that has been shown to reduce high blood pressure (hypertension) in as little as 14 days, while also possibly providing other significant health benefits. These other health benefits include reducing the risk of breast cancer after menopause and reducing the risk of type 2 diabetes, heart disease, colon cancer, and stroke. Health benefits also include weight loss and slowing kidney failure in patients with chronic kidney disease.  DIET  GUIDELINES Limit salt (sodium). Your diet should contain less than 1500 mg of sodium daily. Limit refined or processed carbohydrates. Your diet should include mostly whole grains. Desserts and added sugars should be used sparingly. Include small amounts of heart-healthy fats. These types of fats include nuts, oils, and tub margarine. Limit saturated and trans fats. These fats have been shown to be harmful in the body. CHOOSING FOODS  The following food groups are based on a 2000 calorie diet. See your Registered Dietitian for individual calorie needs. Grains and Grain Products (6 to 8 servings daily) Eat More Often: Whole-wheat bread, brown rice, whole-grain or wheat pasta, quinoa, popcorn without  added fat or salt (air popped). Eat Less Often: White bread, white pasta, white rice, cornbread. Vegetables (4 to 5 servings daily) Eat More Often: Fresh, frozen, and canned vegetables. Vegetables may be raw, steamed, roasted, or grilled with a minimal amount of fat. Eat Less Often/Avoid: Creamed or fried vegetables. Vegetables in a cheese sauce. Fruit (4 to 5 servings daily) Eat More Often: All fresh, canned (in natural juice), or frozen fruits. Dried fruits without added sugar. One hundred percent fruit juice ( cup [237 mL] daily). Eat Less Often: Dried fruits with added sugar. Canned fruit in light or heavy syrup. [employer], Fish, and Poultry (2 servings or less daily. One serving is 3 to 4 oz [85-114 g]). Eat More Often: Ninety percent or leaner ground beef, tenderloin, sirloin. Round cuts of beef, chicken breast, [place] breast. All fish. Grill, bake, or broil your meat. Nothing should be fried. Eat Less Often/Avoid: Fatty cuts of meat, [place], or chicken leg, thigh, or wing. Fried cuts of meat or fish. Dairy (2 to 3 servings) Eat More Often: Low-fat or fat-free milk, low-fat plain or light yogurt, reduced-fat or part-skim cheese. Eat Less Often/Avoid: Milk (whole, 2%).Whole milk yogurt. Full-fat cheeses. Nuts, Seeds, and Legumes (4 to 5 servings per week) Eat More Often: All without added salt. Eat Less Often/Avoid: Salted nuts and seeds, canned beans with added salt. Fats and Sweets (limited) Eat More Often: Vegetable oils, tub margarines without trans fats, sugar-free gelatin. Mayonnaise and salad dressings. Eat Less Often/Avoid: Coconut oils, palm oils, butter, stick margarine, cream, half and half, cookies, candy, pie. FOR MORE INFORMATION The Dash Diet Eating Plan: www.dashdiet.org Document Released: 12/09/2010 Document Revised: 03/14/2011 Document Reviewed: 12/09/2010 [facility] Patient Information 2014 [place], [place].        Dr Paula Libra Recommendations  For nutrition  information, I recommend books:  1).Eat to Live by Dr Excell Seltzer. 2).Prevent and Reverse Heart Disease by Dr Karl Luke. 3) Dr Janene Harvey Book:  Program to Reverse Diabetes  Exercise recommendations are:  If unable to walk, then the patient can exercise in a chair 3 times a day. By flapping arms like a bird gently and raising legs outwards to the front.  If ambulatory, the patient can go for walks for 30 minutes 3 times a week. Then increase the intensity and duration as tolerated.  Goal is to try to attain exercise frequency to 5 times a week.  If applicable: Best to perform resistance exercises (machines or weights) 2 days a week and cardio type exercises 3 days per week.

## 2013-04-06 LAB — CMP14+EGFR
ALT: 12 IU/L (ref 0–44)
AST: 15 IU/L (ref 0–40)
Albumin/Globulin Ratio: 2 (ref 1.1–2.5)
Albumin: 4.2 g/dL (ref 3.6–4.8)
Alkaline Phosphatase: 49 IU/L (ref 39–117)
BUN/Creatinine Ratio: 21 (ref 10–22)
BUN: 23 mg/dL (ref 8–27)
CO2: 22 mmol/L (ref 18–29)
Calcium: 9.4 mg/dL (ref 8.6–10.2)
Chloride: 100 mmol/L (ref 97–108)
Creatinine, Ser: 1.07 mg/dL (ref 0.76–1.27)
GFR calc Af Amer: 82 mL/min/{1.73_m2} (ref >59)
GFR calc non Af Amer: 71 mL/min/{1.73_m2} (ref >59)
Globulin, Total: 2.1 g/dL (ref 1.5–4.5)
Glucose: 107 mg/dL — ABNORMAL HIGH (ref 65–99)
Potassium: 4.3 mmol/L (ref 3.5–5.2)
Sodium: 140 mmol/L (ref 134–144)
Total Bilirubin: 0.4 mg/dL (ref 0.0–1.2)
Total Protein: 6.3 g/dL (ref 6.0–8.5)

## 2013-04-06 LAB — NMR, LIPOPROFILE
Cholesterol: 145 mg/dL (ref <200)
HDL Cholesterol by NMR: 42 mg/dL (ref >=40)
HDL Particle Number: 31.3 umol/L (ref >=30.5)
LDL Particle Number: 932 nmol/L (ref <1000)
LDL Size: 20.5 nm — ABNORMAL LOW (ref >20.5)
LDLC SERPL CALC-MCNC: 53 mg/dL (ref <100)
LP-IR Score: 52 — ABNORMAL HIGH (ref <=45)
Small LDL Particle Number: 425 nmol/L (ref <=527)
Triglycerides by NMR: 250 mg/dL — ABNORMAL HIGH (ref <150)

## 2013-04-06 LAB — TSH: TSH: 0.679 u[IU]/mL (ref 0.450–4.500)

## 2013-04-08 ENCOUNTER — Telehealth (INDEPENDENT_AMBULATORY_CARE_PROVIDER_SITE_OTHER): Payer: Self-pay | Admitting: *Deleted

## 2013-04-08 ENCOUNTER — Other Ambulatory Visit (INDEPENDENT_AMBULATORY_CARE_PROVIDER_SITE_OTHER): Payer: Self-pay | Admitting: *Deleted

## 2013-04-08 ENCOUNTER — Encounter (INDEPENDENT_AMBULATORY_CARE_PROVIDER_SITE_OTHER): Payer: Self-pay | Admitting: Internal Medicine

## 2013-04-08 ENCOUNTER — Ambulatory Visit (INDEPENDENT_AMBULATORY_CARE_PROVIDER_SITE_OTHER): Payer: Managed Care, Other (non HMO) | Admitting: Internal Medicine

## 2013-04-08 VITALS — BP 90/70 | HR 64 | Temp 98.0°F | Ht 72.0 in | Wt 249.1 lb

## 2013-04-08 DIAGNOSIS — K219 Gastro-esophageal reflux disease without esophagitis: Secondary | ICD-10-CM

## 2013-04-08 DIAGNOSIS — R1310 Dysphagia, unspecified: Secondary | ICD-10-CM | POA: Insufficient documentation

## 2013-04-08 DIAGNOSIS — Z1211 Encounter for screening for malignant neoplasm of colon: Secondary | ICD-10-CM

## 2013-04-08 DIAGNOSIS — Z86010 Personal history of colonic polyps: Secondary | ICD-10-CM

## 2013-04-08 DIAGNOSIS — D126 Benign neoplasm of colon, unspecified: Secondary | ICD-10-CM

## 2013-04-08 MED ORDER — PANTOPRAZOLE SODIUM 40 MG PO TBEC: 40.0000 mg | DELAYED_RELEASE_TABLET | Freq: Every day | ORAL | Status: DC

## 2013-04-08 MED ORDER — PEG-KCL-NACL-NASULF-NA ASC-C 100 G PO SOLR: 1.0000 | Freq: Once | ORAL | Status: DC

## 2013-04-08 NOTE — Patient Instructions (Signed)
EGD/ED, Colonoscopy. Protonix 40mg  daily.The risks and benefits such as perforation, bleeding, and infection were reviewed with the patient and is agreeable.

## 2013-04-08 NOTE — Telephone Encounter (Signed)
Patient needs movi prep 

## 2013-04-08 NOTE — Progress Notes (Signed)
Subjective:     Patient ID: [name], male   DOB: 1945/08/16, 68 y.o.   MRN: [id number]  HPI Referred to our office by Dr. Jacelyn Grip epigastric pain and esophageal pain. Sometimes it hurts to walk. He also c/o dysphagia.  He drinks a liquid for the bolus to go down.  One week ago, he had acid reflux which came up into his mouth while lying down at night.  He has had symptoms for 4-5 weeks or longer. He has frequent indigestion and takes Tums on a prn basis.  It occurs usually every day. He also c/o early satiety. Appetite is good. He has lost 20 pounds intentional. No melena or bright red rectal bleeding. Recent cardiac cath for chest pain was negative. Takes a daily ASA 81mg    08/2008 EGD/ED: chest pain, hematemesis Dr. Maurene Capes 1) Stricture in the distal esophagus  2) Hiatal hernia  3) Esophagitis in the distal esophagus  4) Mild gastritis in the antrum  5) Otherwise normal examination  shallow distal es. ulcer, mild stricture, s/p dilation with 16 mm dilator  RECOMMENDATIONS:    05/14/2007 Colonoscopy Dr. Ardis Hughs: rectal bleeding. Comments:  SINGLE SMALL RECTAL POLYP, NO CANCERS. +DIVERTICULOSIS. HIS MILD  INTERMITTENT RECTAL BLEEDING IS LIKLEY FROM SMALL HEMORRHOIDS. HE  TRIED THE CITRUCEL AND NOTICED AN IMPROVEMENT IN HIS MILD  CONSTIPATION, HE KNOWS TO CONTINUE THE FIBER SUPPLEMENT.  IF THE POLYP IS A PRE-CANCEROUS POLYP (ADENOMA), A COLONOSCOPY  SHOULD BE REPEATED IN 5 YEARS. OTHERWISE THE PATIENT SHOULD  CONTINUE TO FOLLOW CURRENT COLORECTAL CANCER SCREENING GUIDELINES  WITH A REPEAT COLONOSCOPY IN 10 YEARS.  Events     MICROSCOPIC EXAMINATION AND DIAGNOSIS   RECTUM, POLYP(S): ADENOMATOUS POLYP. NO HIGH GRADE DYSPLASIA OR EVIDENCE OF MALIGNANCY IDENTIFIED (BIOPSY). (TWO)   CBC    Component Value Date/Time   WBC 6.0 01/15/2013 0520   WBC 7.2 12/24/2012 1054   RBC 4.56 01/15/2013 0520   RBC 5.2 12/24/2012 1054   HGB 14.3 01/15/2013 0526   HGB 15.5 12/24/2012 1054   HCT 42.0 01/15/2013 0526   HCT 47.5 12/24/2012 1054   PLT 203 01/15/2013 0520   MCV 89.9 01/15/2013 0520   MCV 92.2 12/24/2012 1054   MCH 31.6 01/15/2013 0520   MCH 30.1 12/24/2012 1054   MCHC 35.1 01/15/2013 0520   MCHC 32.6 12/24/2012 1054   RDW 13.7 01/15/2013 0520   LYMPHSABS 1.3 02/21/2012 0324   MONOABS 1.5* 02/21/2012 0324   EOSABS 0.1 02/21/2012 0324   BASOSABS 0.0 02/21/2012 0324      Review of Systems Past Medical History  Diagnosis Date  . Coronary atherosclerosis of unspecified type of vessel, native or graft   . Unspecified essential hypertension   . Other and unspecified hyperlipidemia   . Unspecified sleep apnea   . Chronic airway obstruction, not elsewhere classified   . Arthropathy, unspecified, site unspecified   . Thyroid disease     hypothyroid  . Anginal pain   . Hypothyroidism   . Shortness of breath   . Headache(784.0)   . Arthritis     OSTEO BACK KNEES HIPS & HANDS    Past Surgical History  Procedure Laterality Date  . Coronary stent placement      2003, 2004  . Hip arthroplasty    . Ankle surgery    . Hemorrhoid surgery    . Tonsillectomy    . Cardiac catheterization  01/15/2013    No Known Allergies  Current Outpatient Prescriptions on File Prior  to Visit  Medication Sig Dispense Refill  . acetaminophen (TYLENOL) 500 MG tablet Take 1,000 mg by mouth every 4 (four) hours as needed for mild pain, moderate pain, fever or headache.      . albuterol (PROAIR HFA) 108 (90 BASE) MCG/ACT inhaler Inhale 2 puffs into the lungs every 6 (six) hours as needed for wheezing or shortness of breath.  1 Inhaler  2  . aspirin 325 MG tablet Take 325 mg by mouth daily.        Marland Kitchen atenolol (TENORMIN) 50 MG tablet TAKE ONE TABLET BY MOUTH ONE TIME DAILY  90 tablet  3  . HYDROcodone-acetaminophen (NORCO) 7.5-325 MG per tablet Take 1 tablet by mouth 3 (three) times daily as needed.  90 tablet  0  . levothyroxine (SYNTHROID, LEVOTHROID) 175 MCG tablet Take 1 tablet (175  mcg total) by mouth daily.  30 tablet  11  . lisinopril-hydrochlorothiazide (PRINZIDE,ZESTORETIC) 20-25 MG per tablet Take 1 tablet by mouth daily.  30 tablet  11  . nitroGLYCERIN (NITROSTAT) 0.4 MG SL tablet Place 0.4 mg under the tongue every 5 (five) minutes as needed.        . simvastatin (ZOCOR) 40 MG tablet TAKE ONE TABLET BY MOUTH AT BEDTIME  90 tablet  3  . tiotropium (SPIRIVA HANDIHALER) 18 MCG inhalation capsule Place 1 capsule (18 mcg total) into inhaler and inhale daily.  30 capsule  2   No current facility-administered medications on file prior to visit.        Objective:   Physical Exam Filed Vitals:   04/08/13 1103  BP: 90/70  Pulse: 64  Temp: 98 F (36.7 C)  Height: 6' (1.829 m)  Weight: 249 lb 1.6 oz (112.991 kg)  Alert and oriented. Skin warm and dry. Oral mucosa is moist.   . Sclera anicteric, conjunctivae is pink. Thyroid not enlarged. No cervical lymphadenopathy. Lungs clear. Heart regular rate and rhythm.  Abdomen is soft. Bowel sounds are positive. No hepatomegaly. No abdominal masses felt. No tenderness.  No edema to lower extremities.      Assessment:     GERD, Dysphagia. Stricture needs to be ruled out.  Colon adenoma:  Last colonoscopy in 2009    Plan:     Rx for Protonix 40mg  po 30 minutes before breakfast. EGD/ED, Colonoscopy with Dr. Laural Golden.

## 2013-04-14 ENCOUNTER — Other Ambulatory Visit: Payer: Self-pay | Admitting: Family Medicine

## 2013-04-18 ENCOUNTER — Encounter (HOSPITAL_COMMUNITY): Payer: Self-pay | Admitting: Pharmacy Technician

## 2013-05-02 ENCOUNTER — Ambulatory Visit (HOSPITAL_COMMUNITY)
Admission: RE | Admit: 2013-05-02 | Discharge: 2013-05-02 | Disposition: A | Discharge status: Home / Self Care | Payer: Medicare HMO | Source: Ambulatory Visit | Attending: Internal Medicine | Admitting: Internal Medicine

## 2013-05-02 ENCOUNTER — Encounter (HOSPITAL_COMMUNITY)
Admission: RE | Disposition: A | Discharge status: Home / Self Care | Payer: Self-pay | Source: Ambulatory Visit | Attending: Internal Medicine

## 2013-05-02 ENCOUNTER — Other Ambulatory Visit (INDEPENDENT_AMBULATORY_CARE_PROVIDER_SITE_OTHER): Payer: Self-pay | Admitting: Internal Medicine

## 2013-05-02 ENCOUNTER — Encounter (HOSPITAL_COMMUNITY): Payer: Self-pay | Admitting: *Deleted

## 2013-05-02 DIAGNOSIS — J449 Chronic obstructive pulmonary disease, unspecified: Secondary | ICD-10-CM | POA: Insufficient documentation

## 2013-05-02 DIAGNOSIS — Z7982 Long term (current) use of aspirin: Secondary | ICD-10-CM | POA: Insufficient documentation

## 2013-05-02 DIAGNOSIS — K222 Esophageal obstruction: Secondary | ICD-10-CM | POA: Insufficient documentation

## 2013-05-02 DIAGNOSIS — G8929 Other chronic pain: Secondary | ICD-10-CM

## 2013-05-02 DIAGNOSIS — I10 Essential (primary) hypertension: Secondary | ICD-10-CM | POA: Insufficient documentation

## 2013-05-02 DIAGNOSIS — K644 Residual hemorrhoidal skin tags: Secondary | ICD-10-CM | POA: Insufficient documentation

## 2013-05-02 DIAGNOSIS — G4730 Sleep apnea, unspecified: Secondary | ICD-10-CM | POA: Insufficient documentation

## 2013-05-02 DIAGNOSIS — Z1211 Encounter for screening for malignant neoplasm of colon: Secondary | ICD-10-CM | POA: Insufficient documentation

## 2013-05-02 DIAGNOSIS — Z9861 Coronary angioplasty status: Secondary | ICD-10-CM | POA: Insufficient documentation

## 2013-05-02 DIAGNOSIS — I209 Angina pectoris, unspecified: Secondary | ICD-10-CM | POA: Insufficient documentation

## 2013-05-02 DIAGNOSIS — M159 Polyosteoarthritis, unspecified: Secondary | ICD-10-CM | POA: Insufficient documentation

## 2013-05-02 DIAGNOSIS — R1011 Right upper quadrant pain: Secondary | ICD-10-CM

## 2013-05-02 DIAGNOSIS — R1310 Dysphagia, unspecified: Secondary | ICD-10-CM

## 2013-05-02 DIAGNOSIS — R1013 Epigastric pain: Principal | ICD-10-CM

## 2013-05-02 DIAGNOSIS — K449 Diaphragmatic hernia without obstruction or gangrene: Secondary | ICD-10-CM | POA: Insufficient documentation

## 2013-05-02 DIAGNOSIS — E039 Hypothyroidism, unspecified: Secondary | ICD-10-CM | POA: Insufficient documentation

## 2013-05-02 DIAGNOSIS — Z79899 Other long term (current) drug therapy: Secondary | ICD-10-CM | POA: Insufficient documentation

## 2013-05-02 DIAGNOSIS — J4489 Other specified chronic obstructive pulmonary disease: Secondary | ICD-10-CM | POA: Insufficient documentation

## 2013-05-02 DIAGNOSIS — Z860100 Personal history of colon polyps, unspecified: Secondary | ICD-10-CM | POA: Insufficient documentation

## 2013-05-02 DIAGNOSIS — E785 Hyperlipidemia, unspecified: Secondary | ICD-10-CM | POA: Insufficient documentation

## 2013-05-02 DIAGNOSIS — I2510 Atherosclerotic heart disease of native coronary artery without angina pectoris: Secondary | ICD-10-CM | POA: Insufficient documentation

## 2013-05-02 DIAGNOSIS — M479 Spondylosis, unspecified: Secondary | ICD-10-CM | POA: Insufficient documentation

## 2013-05-02 DIAGNOSIS — D126 Benign neoplasm of colon, unspecified: Secondary | ICD-10-CM | POA: Insufficient documentation

## 2013-05-02 DIAGNOSIS — K5730 Diverticulosis of large intestine without perforation or abscess without bleeding: Secondary | ICD-10-CM | POA: Insufficient documentation

## 2013-05-02 HISTORY — PX: ESOPHAGOGASTRODUODENOSCOPY: SHX5428

## 2013-05-02 HISTORY — PX: SAVORY DILATION: SHX5439

## 2013-05-02 HISTORY — PX: COLONOSCOPY: SHX5424

## 2013-05-02 HISTORY — PX: BALLOON DILATION: SHX5330

## 2013-05-02 HISTORY — PX: MALONEY DILATION: SHX5535

## 2013-05-02 SURGERY — COLONOSCOPY
Anesthesia: Moderate Sedation

## 2013-05-02 MED ORDER — MIDAZOLAM HCL 5 MG/5ML IJ SOLN
INTRAMUSCULAR | Status: AC
  Filled 2013-05-02: qty 10

## 2013-05-02 MED ORDER — MEPERIDINE HCL 50 MG/ML IJ SOLN
INTRAMUSCULAR | Status: DC | PRN
  Administered 2013-05-02 (×2): 25 mg via INTRAVENOUS

## 2013-05-02 MED ORDER — SODIUM CHLORIDE 0.9 % IV SOLN
INTRAVENOUS | Status: DC
  Administered 2013-05-02: 12:00:00 via INTRAVENOUS

## 2013-05-02 MED ORDER — STERILE WATER FOR IRRIGATION IR SOLN
Status: DC | PRN
  Administered 2013-05-02: 13:00:00

## 2013-05-02 MED ORDER — BUTAMBEN-TETRACAINE-BENZOCAINE 2-2-14 % EX AERO
INHALATION_SPRAY | CUTANEOUS | Status: DC | PRN
  Administered 2013-05-02: 2 via TOPICAL

## 2013-05-02 MED ORDER — MIDAZOLAM HCL 5 MG/5ML IJ SOLN
INTRAMUSCULAR | Status: DC | PRN
  Administered 2013-05-02: 1 mg via INTRAVENOUS
  Administered 2013-05-02: 2 mg via INTRAVENOUS
  Administered 2013-05-02: 1 mg via INTRAVENOUS
  Administered 2013-05-02 (×2): 2 mg via INTRAVENOUS

## 2013-05-02 MED ORDER — MEPERIDINE HCL 50 MG/ML IJ SOLN
INTRAMUSCULAR | Status: AC
  Filled 2013-05-02: qty 1

## 2013-05-02 NOTE — Op Note (Signed)
EGD PROCEDURE REPORT  PATIENT:  [name]  MR#:  [id number] Birthdate:  1945/04/09, 68 y.o., male Endoscopist:  Dr. Rogene Houston, MD Referred By:  Dr. Anthoney Harada, MD  Procedure Date: 05/02/2013  Procedure:   EGD, ED & Colonoscopy  Indications:  Patient is 68 year old Caucasian male who has history of GERD and esophageal stricture who now presents with solid food dysphagia. He also complains of intermittent epigastric and right upper quadrant pain. He underwent cardiac catheter in January this year for prolonged chest pain again was felt to be noncardiac. Has history of colonic adenoma.             Informed Consent:  The risks, benefits, alternatives & imponderables which include, but are not limited to, bleeding, infection, perforation, drug reaction and potential missed lesion have been reviewed.  The potential for biopsy, lesion removal, esophageal dilation, etc. have also been discussed.  Questions have been answered.  All parties agreeable.  Please see history & physical in medical record for more information.  Medications:  Demerol 50 mg IV Versed 8 mg IV Cetacaine spray topically for oropharyngeal anesthesia  EGD  Description of procedure:  The endoscope was introduced through the mouth and advanced to the second portion of the duodenum without difficulty or limitations. The mucosal surfaces were surveyed very carefully during advancement of the scope and upon withdrawal.  Findings:  Esophagus:  Mucosa of the proximal and middle third was normal. Incomplete ring noted 2 cm proximal to GE junction. Soft stricture noted at GE junction. Scope passed across it without any resistance. GEJ:  38 cm Hiatus:  41 cm Stomach:  Stomach was empty and distended very well with insufflation. Folds in the proximal stomach were normal. Examination mucosa and body, antrum, pyloric channel, angularis, fundus and cardia was normal. Duodenum:  Normal bulbar and post bulbar  mucosa.  Therapeutic/Diagnostic Maneuvers Performed:   Esophagus was dilated by passing 56 [place] Maloney dilator for the insertion. As the dilator was withdrawn and the scope was passed and mucosal destruction noted at GE junction.    COLONOSCOPY Description of procedure:  After a digital rectal exam was performed, that colonoscope was advanced from the anus through the rectum and colon to the area of the cecum, ileocecal valve and appendiceal orifice. The cecum was deeply intubated. These structures were well-seen and photographed for the record. From the level of the cecum and ileocecal valve, the scope was slowly and cautiously withdrawn. The mucosal surfaces were carefully surveyed utilizing scope tip to flexion to facilitate fold flattening as needed. The scope was pulled down into the rectum where a thorough exam including retroflexion was performed.  Findings:   Prep excellent. Three small polyps ablated via cold biopsy from cecum and one from the ascending colon and these polyps were submitted together. Few scattered diverticula at sigmoid colon. Normal rectal mucosa. Mall hemorrhoids with focal erythema noted below the dentate line.  Therapeutic/Diagnostic Maneuvers Performed:  See above  Complications:  None  Cecal Withdrawal Time:  16 minutes  Impression:   EGD findings; Noncritical ring proximal to GE junction. Soft stricture at GE junction with small sliding hiatal hernia. Esophageal stricture dilated by passing 56 [place] Maloney dilator disrupting mucosa at GEJ. No evidence of peptic ulcer disease  Colonoscopy findings; Examination performed to cecum. four small polyps ablated via cold biopsy and submitted together. Three of these were at cecum and one at ascending colon. Mild sigmoid colon diverticulosis. External hemorrhoids   Recommendations:  Standard instructions given. Will schedule upper abdominal ultrasound. I will call patient with biopsy results and  further recommendations.  Rogene Houston  05/02/2013 1:50 PM  CC: Dr. Anthoney Harada, MD & Dr. Rayne Du ref. provider found

## 2013-05-02 NOTE — Discharge Instructions (Signed)
Resume usual medications and diet. No driving for 24 hours. Physician will call with biopsy results. Upper abdominal ultrasound to be scheduled.  Esophagogastroduodenoscopy Care After Refer to this sheet in the next few weeks. These instructions provide you with information on caring for yourself after your procedure. Your caregiver may also give you more specific instructions. Your treatment has been planned according to current medical practices, but problems sometimes occur. Call your caregiver if you have any problems or questions after your procedure.  HOME CARE INSTRUCTIONS  Do not eat or drink anything until the numbing medicine (local anesthetic) has worn off and your gag reflex has returned. You will know that the local anesthetic has worn off when you can swallow comfortably.  Do not drive for 12 hours after the procedure or as directed by your caregiver.  Only take medicines as directed by your caregiver. SEEK MEDICAL CARE IF:   You cannot stop coughing.  You are not urinating at all or less than usual. SEEK IMMEDIATE MEDICAL CARE IF:  You have difficulty swallowing.  You cannot eat or drink.  You have worsening throat or chest pain.  You have dizziness, lightheadedness, or you faint.  You have nausea or vomiting.  You have chills.  You have a fever.  You have severe abdominal pain.  You have black, tarry, or bloody stools. Document Released: 12/07/2011 Document Reviewed: 12/07/2011 [facility] Patient Information 2014 [place], [place]. Colonoscopy, Care After Refer to this sheet in the next few weeks. These instructions provide you with information on caring for yourself after your procedure. Your health care provider may also give you more specific instructions. Your treatment has been planned according to current medical practices, but problems sometimes occur. Call your health care provider if you have any problems or questions after your procedure. WHAT TO  EXPECT AFTER THE PROCEDURE  After your procedure, it is typical to have the following:  A small amount of blood in your stool.  Moderate amounts of gas and mild abdominal cramping or bloating. HOME CARE INSTRUCTIONS  Do not drive, operate machinery, or sign important documents for 24 hours.  You may shower and resume your regular physical activities, but move at a slower pace for the first 24 hours.  Take frequent rest periods for the first 24 hours.  Walk around or put a warm pack on your abdomen to help reduce abdominal cramping and bloating.  Drink enough fluids to keep your urine clear or pale yellow.  You may resume your normal diet as instructed by your health care provider. Avoid heavy or fried foods that are hard to digest.  Avoid drinking alcohol for 24 hours or as instructed by your health care provider.  Only take over-the-counter or prescription medicines as directed by your health care provider.  If a tissue sample (biopsy) was taken during your procedure:  Do not take aspirin or blood thinners for 7 days, or as instructed by your health care provider.  Do not drink alcohol for 7 days, or as instructed by your health care provider.  Eat soft foods for the first 24 hours. SEEK MEDICAL CARE IF: You have persistent spotting of blood in your stool 2 3 days after the procedure. SEEK IMMEDIATE MEDICAL CARE IF:  You have more than a small spotting of blood in your stool.  You pass large blood clots in your stool.  Your abdomen is swollen (distended).  You have nausea or vomiting.  You have a fever.  You have increasing abdominal  pain that is not relieved with medicine. Document Released: 08/04/2003 Document Revised: 10/10/2012 Document Reviewed: 08/27/2012 [facility] Patient Information 2014 [place].

## 2013-05-02 NOTE — H&P (Addendum)
[name] is an 68 y.o. male.   Chief Complaint: Patient is here for EGD, ED and colonoscopy. HPI: Patient is 68 year old Caucasian male who presents with 2 month history of solid food dysphagia. He has history of reflux esophagitis and a stricture in esophagus was last dilated in 2010. It was done by Dr. Delfin Edis. Stricture was dilated to 16 mm. [place] patient's been experiencing dysphagia at every meal. He denies nausea or vomiting. He also complains of intermittent epigastric and right upper quadrant pain. He has intermittent hematochezia felt to be secondary hemorrhoids. His last colonoscopy was in May 2009 with removal of single tubular adenoma and was advised followup in 5 years. Last colonoscopy was done by Dr. Oretha Caprice. He has lost 40 pounds over the last 2 months. He states a weight loss is voluntary Family history is negative for CRC.  Past Medical History  Diagnosis Date  . Coronary atherosclerosis of unspecified type of vessel, native or graft   . Unspecified essential hypertension   . Other and unspecified hyperlipidemia   . Chronic airway obstruction, not elsewhere classified   . Arthropathy, unspecified, site unspecified   . Thyroid disease     hypothyroid  . Anginal pain   . Hypothyroidism   . Headache(784.0)   . Arthritis     OSTEO BACK KNEES HIPS & HANDS  . Unspecified sleep apnea     Dr. Jacelyn Grip at [facility] told pt he had sleep apnea, but never sent him for a sleep study so he doesn't wear a CPAP  . Shortness of breath     with exertion    Past Surgical History  Procedure Laterality Date  . Coronary stent placement      2003, 2004  . Hip arthroplasty    . Ankle surgery    . Hemorrhoid surgery    . Tonsillectomy    . Cardiac catheterization  01/15/2013    Family History  Problem Relation Age of Onset  . Colon cancer Neg Hx   . CAD Father     MI at age 66  . Stroke Mother    Social History:  reports that he quit smoking about 37 years  ago. His smoking use included Cigarettes. He has a 60 pack-year smoking history. He quit smokeless tobacco use about 13 years ago. His smokeless tobacco use included Chew. He reports that he does not drink alcohol or use illicit drugs.  Allergies: No Known Allergies  Medications Prior to Admission  Medication Sig Dispense Refill  . acetaminophen (TYLENOL) 500 MG tablet Take 1,000 mg by mouth every 4 (four) hours as needed for mild pain, moderate pain, fever or headache.      . albuterol (PROAIR HFA) 108 (90 BASE) MCG/ACT inhaler Inhale 2 puffs into the lungs every 6 (six) hours as needed for wheezing or shortness of breath.  1 Inhaler  2  . aspirin 325 MG tablet Take 325 mg by mouth daily.        Marland Kitchen atenolol (TENORMIN) 50 MG tablet TAKE ONE TABLET BY MOUTH ONE TIME DAILY  90 tablet  3  . HYDROcodone-acetaminophen (LORTAB 7.5) 7.5-500 MG per tablet Take 1 tablet by mouth daily.      Marland Kitchen levothyroxine (SYNTHROID, LEVOTHROID) 175 MCG tablet TAKE ONE TABLET BY MOUTH ONE TIME DAILY  30 tablet  11  . lisinopril-hydrochlorothiazide (PRINZIDE,ZESTORETIC) 20-25 MG per tablet Take 1 tablet by mouth daily.  30 tablet  11  . pantoprazole (PROTONIX) 40 MG tablet  Take 1 tablet (40 mg total) by mouth daily.  90 tablet  3  . peg 3350 powder (MOVIPREP) 100 G SOLR Take 1 kit (200 g total) by mouth once.  1 kit  0  . simvastatin (ZOCOR) 40 MG tablet TAKE ONE TABLET BY MOUTH AT BEDTIME  90 tablet  3  . tiotropium (SPIRIVA HANDIHALER) 18 MCG inhalation capsule Place 1 capsule (18 mcg total) into inhaler and inhale daily.  30 capsule  2  . nitroGLYCERIN (NITROSTAT) 0.4 MG SL tablet Place 0.4 mg under the tongue every 5 (five) minutes as needed for chest pain.         No results found for this or any previous visit (from the past 48 hour(s)). No results found.  ROS  Blood pressure 126/71, pulse 64, temperature 98.1 F (36.7 C), temperature source Oral, resp. rate 19, height 6' (1.829 m), weight 227 lb (102.967 kg),  SpO2 95.00%. Physical Exam  Constitutional: He appears well-developed and well-nourished.  HENT:  Mouth/Throat: Oropharynx is clear and moist.  Eyes: Conjunctivae are normal. No scleral icterus.  Neck: No thyromegaly present.  Cardiovascular: Normal rate, regular rhythm and normal heart sounds.   No murmur heard. Respiratory: Effort normal and breath sounds normal.  GI: Soft. He exhibits no distension and no mass. There is tenderness (mild tenderness in midepigastrium and right upper quadrant.).  Lymphadenopathy:    He has no cervical adenopathy.     Assessment/Plan Solid food dysphagia in a patient with history of GERD. Epigastric and right upper quadrant abdominal pain. History of colonic adenoma. EGD, ED and colonoscopy.  Rogene Houston 05/02/2013, 12:47 PM

## 2013-05-03 ENCOUNTER — Encounter (HOSPITAL_COMMUNITY): Payer: Self-pay | Admitting: Internal Medicine

## 2013-05-09 ENCOUNTER — Ambulatory Visit (HOSPITAL_COMMUNITY)
Admission: RE | Admit: 2013-05-09 | Discharge: 2013-05-09 | Disposition: A | Discharge status: Home / Self Care | Payer: Medicare HMO | Source: Ambulatory Visit | Attending: Internal Medicine | Admitting: Internal Medicine

## 2013-05-09 DIAGNOSIS — R1013 Epigastric pain: Secondary | ICD-10-CM | POA: Insufficient documentation

## 2013-05-09 DIAGNOSIS — G8929 Other chronic pain: Secondary | ICD-10-CM | POA: Insufficient documentation

## 2013-05-09 DIAGNOSIS — R1011 Right upper quadrant pain: Secondary | ICD-10-CM | POA: Insufficient documentation

## 2013-06-02 ENCOUNTER — Encounter: Payer: Self-pay | Admitting: Gastroenterology

## 2013-06-21 ENCOUNTER — Telehealth: Payer: Self-pay | Admitting: Family Medicine

## 2013-06-21 ENCOUNTER — Other Ambulatory Visit: Payer: Self-pay | Admitting: Family Medicine

## 2013-06-21 MED ORDER — HYDROCODONE-ACETAMINOPHEN 7.5-500 MG PO TABS: 1.0000 | ORAL_TABLET | Freq: Every day | ORAL | Status: DC

## 2013-06-21 NOTE — Telephone Encounter (Signed)
This is okay x1----the patient will need to be seen for future refills

## 2013-06-21 NOTE — Telephone Encounter (Signed)
Last filled 04/30/13, last seen 04/04/13 by Jacelyn Grip. Rx will print

## 2013-06-24 ENCOUNTER — Other Ambulatory Visit: Payer: Self-pay | Admitting: Nurse Practitioner

## 2013-06-24 ENCOUNTER — Ambulatory Visit: Payer: Medicare HMO | Admitting: Family Medicine

## 2013-06-24 MED ORDER — HYDROCODONE-ACETAMINOPHEN 7.5-500 MG PO TABS: 1.0000 | ORAL_TABLET | Freq: Every day | ORAL | Status: DC

## 2013-06-24 NOTE — Telephone Encounter (Signed)
Dr. Laurance Flatten printed and left did not sign RX will you please re-do?

## 2013-06-24 NOTE — Telephone Encounter (Signed)
Mmm done this already - pt is aware to call back the first of July - i think he would be able to see Dr Sabra Heck - by that time

## 2013-07-11 ENCOUNTER — Telehealth: Payer: Self-pay | Admitting: Family Medicine

## 2013-07-11 NOTE — Telephone Encounter (Signed)
appt givne for 7/28 with Sabra Heck

## 2013-07-17 ENCOUNTER — Telehealth: Payer: Self-pay | Admitting: Family Medicine

## 2013-07-18 ENCOUNTER — Other Ambulatory Visit: Payer: Self-pay | Admitting: *Deleted

## 2013-07-18 MED ORDER — HYDROCODONE-ACETAMINOPHEN 7.5-300 MG PO TABS: 1.0000 | ORAL_TABLET | Freq: Once | ORAL | Status: DC

## 2013-07-18 NOTE — Telephone Encounter (Signed)
I have reviewed his medical record. It does not appear that he takes too much of this narcotic and I would the okay with high giving him a refill. I will discuss this with him on his office visit later this month. If you can print a prescription will be glad to sign.

## 2013-07-30 ENCOUNTER — Encounter: Payer: Self-pay | Admitting: Family Medicine

## 2013-07-30 ENCOUNTER — Ambulatory Visit (INDEPENDENT_AMBULATORY_CARE_PROVIDER_SITE_OTHER): Payer: Medicare HMO | Admitting: Family Medicine

## 2013-07-30 VITALS — BP 113/65 | HR 55 | Temp 98.5°F | Ht 72.0 in | Wt 242.0 lb

## 2013-07-30 DIAGNOSIS — E785 Hyperlipidemia, unspecified: Secondary | ICD-10-CM

## 2013-07-30 DIAGNOSIS — I10 Essential (primary) hypertension: Secondary | ICD-10-CM

## 2013-07-30 DIAGNOSIS — E079 Disorder of thyroid, unspecified: Secondary | ICD-10-CM | POA: Diagnosis not present

## 2013-07-30 DIAGNOSIS — M129 Arthropathy, unspecified: Secondary | ICD-10-CM | POA: Diagnosis not present

## 2013-07-30 MED ORDER — HYDROCODONE-ACETAMINOPHEN 7.5-300 MG PO TABS: 1.0000 | ORAL_TABLET | Freq: Three times a day (TID) | ORAL | Status: DC

## 2013-07-30 NOTE — Progress Notes (Signed)
   Subjective:    Patient ID: [name], male    DOB: August 02, 1945, 68 y.o.   MRN: [id number]  HPI  68 year old gentleman here to followup chronic problems: Hypertension, hyperlipidemia, hypothyroidism, COPD, and arthritis. He is generally doing well but has not had hydrocodone since the his previous physician left and he feels as though he needs 3 a day for his arthritis. He has had no recent exacerbations of COPD. His blood pressure has been well controlled. There no side effects from his statin. He does relate some decreased energy levels and wonders if it might be related to thyroid.    Review of Systems  Constitutional: Positive for fatigue.  HENT: Negative.   Eyes: Negative.   Respiratory: Positive for shortness of breath.   Cardiovascular: Positive for chest pain (does have stents).  Endocrine: Negative.   Genitourinary: Negative.   Neurological: Negative.        Objective:   Physical Exam  Constitutional: He is oriented to person, place, and time. He appears well-developed and well-nourished.  HENT:  Head: Normocephalic.  Right Ear: External ear normal.  Left Ear: External ear normal.  Nose: Nose normal.  Mouth/Throat: Oropharynx is clear and moist.  Eyes: Conjunctivae and EOM are normal. Pupils are equal, round, and reactive to light.  Neck: Normal range of motion. Neck supple.  Cardiovascular: Normal rate, regular rhythm, normal heart sounds and intact distal pulses.   Pulmonary/Chest: Effort normal and breath sounds normal.  Abdominal: Soft. Bowel sounds are normal.  Musculoskeletal: Normal range of motion.  Neurological: He is alert and oriented to person, place, and time.  Skin: Skin is warm and dry.  Psychiatric: He has a normal mood and affect. His behavior is normal. Judgment and thought content normal.          Assessment & Plan:  1. Thyroid disease Check labs today and refill as indicated - TSH  2. HYPERLIPIDEMIA Had triglycerides and total  cholesterol checked in April but has not had LDL and HDL checked in over a year we'll do so today. - Lipid panel - Hepatic function panel  3. HYPERTENSION Blood pressure controlled on lisinopril and atenolol. We'll continue same medicines   4. ARTHRITIS As noted above needs hydrocodone refilled. He takes 3 per day to control his symptoms and pain of arthritis.  Wardell Honour MD

## 2013-07-31 LAB — LIPID PANEL
Chol/HDL Ratio: 3 ratio units (ref 0.0–5.0)
Cholesterol, Total: 142 mg/dL (ref 100–199)
HDL: 47 mg/dL (ref >39)
LDL Calculated: 72 mg/dL (ref 0–99)
Triglycerides: 117 mg/dL (ref 0–149)
VLDL Cholesterol Cal: 23 mg/dL (ref 5–40)

## 2013-07-31 LAB — TSH: TSH: 0.909 u[IU]/mL (ref 0.450–4.500)

## 2013-07-31 LAB — HEPATIC FUNCTION PANEL
ALT: 12 IU/L (ref 0–44)
AST: 14 IU/L (ref 0–40)
Albumin: 4.1 g/dL (ref 3.6–4.8)
Alkaline Phosphatase: 50 IU/L (ref 39–117)
Bilirubin, Direct: 0.12 mg/dL (ref 0.00–0.40)
Total Bilirubin: 0.5 mg/dL (ref 0.0–1.2)
Total Protein: 6.2 g/dL (ref 6.0–8.5)

## 2013-08-16 ENCOUNTER — Other Ambulatory Visit: Payer: Self-pay

## 2013-08-16 DIAGNOSIS — I10 Essential (primary) hypertension: Secondary | ICD-10-CM

## 2013-08-16 MED ORDER — LISINOPRIL-HYDROCHLOROTHIAZIDE 20-25 MG PO TABS: 1.0000 | ORAL_TABLET | Freq: Every day | ORAL | Status: DC

## 2013-09-02 ENCOUNTER — Encounter: Payer: Self-pay | Admitting: Pulmonary Disease

## 2013-09-02 ENCOUNTER — Ambulatory Visit (INDEPENDENT_AMBULATORY_CARE_PROVIDER_SITE_OTHER): Payer: Medicare HMO | Admitting: Pulmonary Disease

## 2013-09-02 ENCOUNTER — Encounter (INDEPENDENT_AMBULATORY_CARE_PROVIDER_SITE_OTHER): Payer: Self-pay

## 2013-09-02 VITALS — BP 128/76 | HR 54 | Ht 71.5 in | Wt 245.0 lb

## 2013-09-02 DIAGNOSIS — R0989 Other specified symptoms and signs involving the circulatory and respiratory systems: Secondary | ICD-10-CM

## 2013-09-02 DIAGNOSIS — R0609 Other forms of dyspnea: Secondary | ICD-10-CM

## 2013-09-02 DIAGNOSIS — J449 Chronic obstructive pulmonary disease, unspecified: Secondary | ICD-10-CM

## 2013-09-02 DIAGNOSIS — R0600 Dyspnea, unspecified: Secondary | ICD-10-CM | POA: Insufficient documentation

## 2013-09-02 NOTE — Assessment & Plan Note (Signed)
Presumably this is due to COPD and deconditioning.  We will treat the COPD as above, but I have asked him to discuss his dyspnea with his cardiologist when he sees him next month.

## 2013-09-02 NOTE — Progress Notes (Signed)
Subjective:    Patient ID: [name], male    DOB: 09-Nov-1945, 68 y.o.   MRN: [id number]  Synopsis: GOLD COPD grade A, first saw [facility] in 2014. 12/05/2012 simple spirometry> ratio 70% (predicted ratio 77%), clear airflow obstruction on flow volume loop, FEV1 2.50 L (60% predicted)  HPI Chief Complaint  Patient presents with  . Follow-up    Pt c/o stable SOB with exertion, no worse or better since last visit.  Pt states he also begins wheezing when he gets SOB.  Pt was put on Spiriva last visit, states it dried his throat out and made it difficult to breathe and swallow, so he stopped the med.    09/02/2013 ROV> [name] stopped taking the Spiriva after we saw him last because it made his throat dry and swollen.  He said that it really didn't make too much difference with dyspnea.  He mostly feel sdyspnea on exertion.  He doesn't feel chest pain with dyspnea.  He still uses albuterol on days when his wheezing badly.  He says he probably uses it once per day. It helps when he takes it.  He feels like he isnt' recovering as quick from episodes of dyspnea and wheezing.    Past Medical History  Diagnosis Date  . Coronary atherosclerosis of unspecified type of vessel, native or graft   . Unspecified essential hypertension   . Other and unspecified hyperlipidemia   . Chronic airway obstruction, not elsewhere classified   . Arthropathy, unspecified, site unspecified   . Thyroid disease     hypothyroid  . Anginal pain   . Hypothyroidism   . Headache(784.0)   . Arthritis     OSTEO BACK KNEES HIPS & HANDS  . Unspecified sleep apnea     Dr. Jacelyn Grip at [facility] told pt he had sleep apnea, but never sent him for a sleep study so he doesn't wear a CPAP  . Shortness of breath     with exertion     Review of Systems     Objective:   Physical Exam Filed Vitals:   09/02/13 1102  BP: 128/76  Pulse: 54  Height: 5' 11.5" (1.816 m)  Weight: 245 lb (111.131 kg)  SpO2: 96%     RA  Gen: well appearing, no acute distress HEENT: NCAT, EOMi, OP clear  PULM: CTA B CV: RRR, no mgr, no JVD AB: BS+, soft, nontender,  Ext: warm, no edema, no clubbing, no cyanosis Derm: no rash or skin breakdown Neuro: A&Ox4, MAEW      Assessment & Plan:   COPD He only has grade A COPD without a history of recurrent exacerbations. Says the utility of drugs like Spiriva are really only indicated for his symptoms. Because  Of the side effect from Spriva (which sounds like a side effect of the dry powder inhaler) I am going to have him try a new long acting beta agonist (Striverdi) daily. If he does not seem to improve with that then we will change his albuterol to 4 times a day when necessary Combivent.  Plan: -Flu shot in the fall -Start Striverdi - If no improvement with Striverdi then will change albuterol to when necessary Combivent -Followup 6 months  Dyspnea Presumably this is due to COPD and deconditioning.  We will treat the COPD as above, but I have asked him to discuss his dyspnea with his cardiologist when he sees him next month.    Updated Medication List Outpatient Encounter Prescriptions  as of 09/02/2013  Medication Sig  . albuterol (PROAIR HFA) 108 (90 BASE) MCG/ACT inhaler Inhale 2 puffs into the lungs every 6 (six) hours as needed for wheezing or shortness of breath.  Marland Kitchen aspirin 325 MG tablet Take 325 mg by mouth daily.    Marland Kitchen atenolol (TENORMIN) 50 MG tablet TAKE ONE TABLET BY MOUTH ONE TIME DAILY  . Hydrocodone-Acetaminophen 7.5-300 MG TABS Take 1 tablet by mouth 3 (three) times daily.  Marland Kitchen levothyroxine (SYNTHROID, LEVOTHROID) 175 MCG tablet TAKE ONE TABLET BY MOUTH ONE TIME DAILY  . lisinopril-hydrochlorothiazide (PRINZIDE,ZESTORETIC) 20-25 MG per tablet Take 1 tablet by mouth daily.  . nitroGLYCERIN (NITROSTAT) 0.4 MG SL tablet Place 0.4 mg under the tongue every 5 (five) minutes as needed for chest pain.   . pantoprazole (PROTONIX) 40 MG tablet Take 1 tablet  (40 mg total) by mouth daily.  . simvastatin (ZOCOR) 40 MG tablet TAKE ONE TABLET BY MOUTH AT BEDTIME  . [DISCONTINUED] tiotropium (SPIRIVA HANDIHALER) 18 MCG inhalation capsule Place 1 capsule (18 mcg total) into inhaler and inhale daily.

## 2013-09-02 NOTE — Assessment & Plan Note (Signed)
He only has grade A COPD without a history of recurrent exacerbations. Says the utility of drugs like Spiriva are really only indicated for his symptoms. Because  Of the side effect from Spriva (which sounds like a side effect of the dry powder inhaler) I am going to have him try a new long acting beta agonist (Striverdi) daily. If he does not seem to improve with that then we will change his albuterol to 4 times a day when necessary Combivent.  Plan: -Flu shot in the fall -Start Striverdi - If no improvement with Striverdi then will change albuterol to when necessary Combivent -Followup 6 months

## 2013-09-02 NOTE — Patient Instructions (Signed)
Take the Striverdi 2 puffs once per day After 2 weeks of taking this let us know if it seems to help so we can call in a prescription If you are not better with the Striverdi then we will change your albuterol to combivent to be used one puff every 6 hours as needed for shortness of breath Get a flu shot We will see you back in 6 months or sooner if needed

## 2013-09-16 ENCOUNTER — Telehealth: Payer: Self-pay | Admitting: Pulmonary Disease

## 2013-09-16 ENCOUNTER — Telehealth: Payer: Self-pay | Admitting: Family Medicine

## 2013-09-16 MED ORDER — OLODATEROL HCL 2.5 MCG/ACT IN AERS: 2.0000 | INHALATION_SPRAY | Freq: Every day | RESPIRATORY_TRACT | Status: DC

## 2013-09-16 NOTE — Telephone Encounter (Signed)
Pt was seen by Dr. Lake Bells on 09/02/13 with the following instructions:  Patient Instructions     Take the Striverdi 2 puffs once per day  After 2 weeks of taking this let us know if it seems to help so we can call in a prescription  If you are not better with the Striverdi then we will change your albuterol to combivent to be used one puff every 6 hours as needed for shortness of breath  Get a flu shot  We will see you back in 6 months or sooner if needed   --------  Called, spoke with pt who reports Striverdi works better than previous inhalers.  Would like Striverdi rx sent to pharm -- rx sent.  Pt aware and voiced no further questions or concerns at this time.

## 2013-09-17 NOTE — Telephone Encounter (Signed)
thanks

## 2013-09-19 ENCOUNTER — Telehealth: Payer: Self-pay | Admitting: Pulmonary Disease

## 2013-09-19 MED ORDER — HYDROCODONE-ACETAMINOPHEN 7.5-300 MG PO TABS: 1.0000 | ORAL_TABLET | Freq: Three times a day (TID) | ORAL | Status: DC

## 2013-09-19 NOTE — Telephone Encounter (Signed)
BQ---  PA form received from the pharmacy that the [place] is not covered and will need PA   OR the pt can try Foradil.  BQ please advise.  Thanks  If PA needed call:    [phone] PT ID#  [facility]

## 2013-09-19 NOTE — Telephone Encounter (Signed)
Patient called Monday requesting refill on hydrocodone. This was sent to Dr Sabra Heck but it doesn't appear there was a request written on the telephone encounter.  Would you refill this?

## 2013-09-19 NOTE — Telephone Encounter (Signed)
Patient aware to pick up 

## 2013-09-19 NOTE — Telephone Encounter (Signed)
rx ready for pickup 

## 2013-09-23 NOTE — Telephone Encounter (Signed)
I am OK with foradil bid

## 2013-09-24 MED ORDER — FORMOTEROL FUMARATE 12 MCG IN CAPS: 12.0000 ug | ORAL_CAPSULE | Freq: Two times a day (BID) | RESPIRATORY_TRACT | Status: DC

## 2013-09-24 NOTE — Telephone Encounter (Signed)
Spoke with pt's wife Zigmund Daniel (DPR on file) to let her know that we were going to try a different med instead of the striverdi called Foradil.  Asked for pt to call back when he returns home to have [place] explain how to use the Foradil.  Med has already been called in to [facility] in [place].

## 2013-09-26 NOTE — Telephone Encounter (Signed)
Spoke with pt, he is aware of medication change and will contact us if he has any problems with the Foradil.  Nothing further needed at this time.

## 2013-10-15 ENCOUNTER — Other Ambulatory Visit: Payer: Self-pay | Admitting: *Deleted

## 2013-10-15 DIAGNOSIS — E785 Hyperlipidemia, unspecified: Secondary | ICD-10-CM

## 2013-10-15 MED ORDER — SIMVASTATIN 40 MG PO TABS: ORAL_TABLET | ORAL | Status: DC

## 2013-10-23 ENCOUNTER — Other Ambulatory Visit: Payer: Self-pay | Admitting: *Deleted

## 2013-10-23 DIAGNOSIS — I10 Essential (primary) hypertension: Secondary | ICD-10-CM

## 2013-10-23 MED ORDER — LISINOPRIL-HYDROCHLOROTHIAZIDE 20-25 MG PO TABS: 1.0000 | ORAL_TABLET | Freq: Every day | ORAL | Status: DC

## 2013-10-29 ENCOUNTER — Ambulatory Visit (INDEPENDENT_AMBULATORY_CARE_PROVIDER_SITE_OTHER): Payer: Medicare HMO | Admitting: Cardiology

## 2013-10-29 ENCOUNTER — Encounter: Payer: Self-pay | Admitting: Cardiology

## 2013-10-29 VITALS — BP 104/70 | HR 62 | Ht 71.0 in | Wt 249.9 lb

## 2013-10-29 DIAGNOSIS — E785 Hyperlipidemia, unspecified: Secondary | ICD-10-CM

## 2013-10-29 DIAGNOSIS — I10 Essential (primary) hypertension: Secondary | ICD-10-CM

## 2013-10-29 DIAGNOSIS — I2510 Atherosclerotic heart disease of native coronary artery without angina pectoris: Secondary | ICD-10-CM

## 2013-10-29 NOTE — Patient Instructions (Signed)
Continue your current therapy  I will see you in one year   

## 2013-10-29 NOTE — Progress Notes (Signed)
[name] Date of Birth: 16-Jul-1945 Medical Record #[id number]  History of Present Illness: [name] is seen for follow up today. He has a history of coronary disease. His initial cardiac event was in 2001. He had stenting of the third obtuse marginal vessel with a bare-metal stent. He had repeat cardiac catheterization in 2003 including intravascular ultrasound with no obstructive disease. In 2007 he presented with recurrent angina and had stenting of the proximal left circumflex with a Taxus stent. He thinks he had 2 stents placed at that time but the procedure note only indicates 1 stent. In 2010 he had another cardiac catheterization. This demonstrated moderate disease in the RCA and 70%. His stents were still patent. He was treated medically. He does report that his prior stent procedures were always preceded by severe chest pain. He had a Lexiscan myoview study in Nov. 2014 which showed a small lateral fixed defect consistent with scar or artifact. No ischemia. EF 59%. He denies any recent chest pain. He does note some  shortness of breath on exertion walking up hills or stairs. This is chronic. He is followed by pulmonary and Dr. Lake Bells changed his inhaler with some improvement.He is limited by arthritic knees. He has a history of hyperlipidemia, hypertension, and family history of early coronary disease.  Current Outpatient Prescriptions on File Prior to Visit  Medication Sig Dispense Refill  . albuterol (PROAIR HFA) 108 (90 BASE) MCG/ACT inhaler Inhale 2 puffs into the lungs every 6 (six) hours as needed for wheezing or shortness of breath.  1 Inhaler  2  . aspirin 325 MG tablet Take 325 mg by mouth daily.        Marland Kitchen atenolol (TENORMIN) 50 MG tablet TAKE ONE TABLET BY MOUTH ONE TIME DAILY  90 tablet  3  . formoterol (FORADIL AEROLIZER) 12 MCG capsule for inhaler Place 1 capsule (12 mcg total) into inhaler and inhale 2 (two) times daily.  60 capsule  12  . Hydrocodone-Acetaminophen  7.5-300 MG TABS Take 1 tablet by mouth 3 (three) times daily.  90 each  0  . levothyroxine (SYNTHROID, LEVOTHROID) 175 MCG tablet TAKE ONE TABLET BY MOUTH ONE TIME DAILY  30 tablet  11  . lisinopril-hydrochlorothiazide (PRINZIDE,ZESTORETIC) 20-25 MG per tablet Take 1 tablet by mouth daily.  90 tablet  0  . nitroGLYCERIN (NITROSTAT) 0.4 MG SL tablet Place 0.4 mg under the tongue every 5 (five) minutes as needed for chest pain.       . pantoprazole (PROTONIX) 40 MG tablet Take 1 tablet (40 mg total) by mouth daily.  90 tablet  3  . simvastatin (ZOCOR) 40 MG tablet TAKE ONE TABLET BY MOUTH AT BEDTIME  90 tablet  0   No current facility-administered medications on file prior to visit.    No Known Allergies  Past Medical History  Diagnosis Date  . Coronary atherosclerosis of unspecified type of vessel, native or graft   . Unspecified essential hypertension   . Other and unspecified hyperlipidemia   . Chronic airway obstruction, not elsewhere classified   . Arthropathy, unspecified, site unspecified   . Thyroid disease     hypothyroid  . Anginal pain   . Hypothyroidism   . Headache(784.0)   . Arthritis     OSTEO BACK KNEES HIPS & HANDS  . Unspecified sleep apnea     Dr. Jacelyn Grip at [facility] told pt he had sleep apnea, but never sent him for a sleep study so he doesn't wear  a CPAP  . Shortness of breath     with exertion    Past Surgical History  Procedure Laterality Date  . Coronary stent placement      2003, 2004  . Hip arthroplasty    . Ankle surgery    . Hemorrhoid surgery    . Tonsillectomy    . Cardiac catheterization  01/15/2013  . Colonoscopy N/A 05/02/2013    Procedure: COLONOSCOPY;  Surgeon: Rogene Houston, MD;  Location: [facility];  Service: Endoscopy;  Laterality: N/A;  200-moved to 1200 Ann notified pt  . Esophagogastroduodenoscopy N/A 05/02/2013    Procedure: ESOPHAGOGASTRODUODENOSCOPY (EGD);  Surgeon: Rogene Houston, MD;  Location: [facility];   Service: Endoscopy;  Laterality: N/A;  . Balloon dilation N/A 05/02/2013    Procedure: BALLOON DILATION;  Surgeon: Rogene Houston, MD;  Location: [facility];  Service: Endoscopy;  Laterality: N/A;  Venia Minks dilation N/A 05/02/2013    Procedure: Venia Minks DILATION;  Surgeon: Rogene Houston, MD;  Location: [facility];  Service: Endoscopy;  Laterality: N/A;  . Savory dilation N/A 05/02/2013    Procedure: SAVORY DILATION;  Surgeon: Rogene Houston, MD;  Location: [facility];  Service: Endoscopy;  Laterality: N/A;    History  Smoking status  . Former Smoker -- 3.00 packs/day for 20 years  . Types: Cigarettes  . Quit date: 05/26/1975  Smokeless tobacco  . Former Systems developer  . Types: Chew  . Quit date: 01/04/2000    History  Alcohol Use No    Family History  Problem Relation Age of Onset  . Colon cancer Neg Hx   . CAD Father     MI at age 15  . Stroke Mother     Review of Systems: The review of systems is positive for severe arthritis in his knees, particularly on the left.  All other systems were reviewed and are negative.  Physical Exam: BP 104/70  Pulse 62  Ht 5\' 11"  (1.803 m)  Wt 249 lb 14.4 oz (113.354 kg)  BMI 34.87 kg/m2 He is a pleasant white male in no acute distress. HEENT: Normal. Neck: No JVD, adenopathy, thyromegaly, or bruits. Lungs: Clear Cardiovascular: Regular rate and rhythm. Normal S1 and S2. No gallop, murmur, or click. Abdomen: Soft and nontender. No masses or bruits. Bowel sounds are positive. Extremities: No cyanosis or edema. Pulses are 2+ and symmetric. Skin: Warm and dry Neuro: Alert and oriented x3. Cranial nerves II through XII are intact.  LABORATORY DATA: Lab Results  Component Value Date   WBC 6.0 01/15/2013   HGB 14.3 01/15/2013   HCT 42.0 01/15/2013   PLT 203 01/15/2013   GLUCOSE 107* 04/04/2013   CHOL 145 04/04/2013   TRIG 117 07/30/2013   HDL 47 07/30/2013   LDLDIRECT 70.0 04/12/2006   LDLCALC 72 07/30/2013   ALT 12 07/30/2013   AST 14  07/30/2013   NA 140 04/04/2013   K 4.3 04/04/2013   CL 100 04/04/2013   CREATININE 1.07 04/04/2013   BUN 23 04/04/2013   CO2 22 04/04/2013   TSH 0.909 07/30/2013   INR 0.98 01/15/2013   HGBA1C  Value: 5.8 (NOTE) The ADA recommends the following therapeutic goal for glycemic control related to Hgb A1c measurement: Goal of therapy: <6.5 Hgb A1c  Reference: American Diabetes Association: Clinical Practice Recommendations 2010, Diabetes Care, 2010, 33: (Suppl  1). 08/06/2008     Assessment / Plan: 1. Coronary disease status post stenting of the third  obtuse marginal vessel and 2003 with a bare-metal stent. Status post stenting of the proximal left circumflex in 2007 with a Taxus DES. Currently without significant anginal symptoms. Lexiscan Myoview in Nov. 2014 without ischemia. EF 59%. Will continue medical therapy.  2. Hyperlipidemia. Continue statin therapy. Last lipid panel was excellent.  3. Hypertension-controlled. Continue lisinopril and atenolol.  4. COPD. Per pulmonary.

## 2013-11-07 ENCOUNTER — Telehealth: Payer: Self-pay | Admitting: Emergency Medicine

## 2013-11-07 MED ORDER — TIOTROPIUM BROMIDE-OLODATEROL 2.5-2.5 MCG/ACT IN AERS: 2.0000 | INHALATION_SPRAY | Freq: Every day | RESPIRATORY_TRACT | Status: DC

## 2013-11-07 NOTE — Telephone Encounter (Signed)
Called spoke with pt. He reports he was placed on foradil since striverdi is not covered by insurance. He reports once he uses the foradil, he starts having a "nervous shake" for about 2 hrs then goes away. Then same thing happens again when he takes the afternoon dose. Pt is requesting recs. Schedule reports BQ on night float. Please advise Dr. Annamaria Boots thanks  No Known Allergies   Current Outpatient Prescriptions on File Prior to Visit  Medication Sig Dispense Refill  . albuterol (PROAIR HFA) 108 (90 BASE) MCG/ACT inhaler Inhale 2 puffs into the lungs every 6 (six) hours as needed for wheezing or shortness of breath. 1 Inhaler 2  . aspirin 325 MG tablet Take 325 mg by mouth daily.      Marland Kitchen atenolol (TENORMIN) 50 MG tablet TAKE ONE TABLET BY MOUTH ONE TIME DAILY 90 tablet 3  . formoterol (FORADIL AEROLIZER) 12 MCG capsule for inhaler Place 1 capsule (12 mcg total) into inhaler and inhale 2 (two) times daily. 60 capsule 12  . Hydrocodone-Acetaminophen 7.5-300 MG TABS Take 1 tablet by mouth 3 (three) times daily. 90 each 0  . levothyroxine (SYNTHROID, LEVOTHROID) 175 MCG tablet TAKE ONE TABLET BY MOUTH ONE TIME DAILY 30 tablet 11  . lisinopril-hydrochlorothiazide (PRINZIDE,ZESTORETIC) 20-25 MG per tablet Take 1 tablet by mouth daily. 90 tablet 0  . nitroGLYCERIN (NITROSTAT) 0.4 MG SL tablet Place 0.4 mg under the tongue every 5 (five) minutes as needed for chest pain.     . pantoprazole (PROTONIX) 40 MG tablet Take 1 tablet (40 mg total) by mouth daily. 90 tablet 3  . simvastatin (ZOCOR) 40 MG tablet TAKE ONE TABLET BY MOUTH AT BEDTIME 90 tablet 0   No current facility-administered medications on file prior to visit.

## 2013-11-07 NOTE — Telephone Encounter (Signed)
I called and spoke with the pt and notified of recs per CDY  He denies any prostate problems or glaucoma  I sample stiolto in triage  Pt will come tomorrow am around 11 to learn technique

## 2013-11-07 NOTE — Telephone Encounter (Signed)
If he doesn't have glaucoma or prostate trouble, I would offer a trial of Stiolto Respimat, 2 puffs, once daily. Can he come by here or [facility] office for a sample and instruction?

## 2013-11-08 ENCOUNTER — Other Ambulatory Visit: Payer: Self-pay | Admitting: *Deleted

## 2013-11-08 DIAGNOSIS — I10 Essential (primary) hypertension: Secondary | ICD-10-CM

## 2013-11-08 MED ORDER — ATENOLOL 50 MG PO TABS: ORAL_TABLET | ORAL | Status: DC

## 2013-11-12 ENCOUNTER — Other Ambulatory Visit: Payer: Self-pay | Admitting: Family Medicine

## 2013-11-12 ENCOUNTER — Telehealth: Payer: Self-pay | Admitting: Pulmonary Disease

## 2013-11-12 DIAGNOSIS — M129 Arthropathy, unspecified: Secondary | ICD-10-CM

## 2013-11-12 MED ORDER — TIOTROPIUM BROMIDE-OLODATEROL 2.5-2.5 MCG/ACT IN AERS: 2.0000 | INHALATION_SPRAY | Freq: Every day | RESPIRATORY_TRACT | Status: DC

## 2013-11-12 NOTE — Telephone Encounter (Signed)
Called and spoke with pt and he is aware of refill that has been sent to the pharmacy.  Nothing further is needed.  

## 2013-11-12 NOTE — Telephone Encounter (Signed)
Last filled 09/19/13, last seen by you 07/30/13. Rx will print, if approved

## 2013-11-15 MED ORDER — HYDROCODONE-ACETAMINOPHEN 7.5-300 MG PO TABS: 1.0000 | ORAL_TABLET | Freq: Three times a day (TID) | ORAL | Status: DC

## 2013-12-03 ENCOUNTER — Ambulatory Visit: Payer: Medicare HMO | Attending: Orthopedic Surgery | Admitting: Physical Therapy

## 2013-12-03 DIAGNOSIS — M25562 Pain in left knee: Secondary | ICD-10-CM | POA: Diagnosis present

## 2013-12-03 DIAGNOSIS — M25662 Stiffness of left knee, not elsewhere classified: Secondary | ICD-10-CM | POA: Insufficient documentation

## 2013-12-03 HISTORY — PX: KNEE SURGERY: SHX244

## 2013-12-04 ENCOUNTER — Ambulatory Visit: Payer: Medicare HMO | Admitting: Physical Therapy

## 2013-12-04 DIAGNOSIS — M25562 Pain in left knee: Secondary | ICD-10-CM | POA: Diagnosis not present

## 2013-12-05 ENCOUNTER — Ambulatory Visit: Payer: Medicare HMO | Admitting: *Deleted

## 2013-12-05 DIAGNOSIS — M25562 Pain in left knee: Secondary | ICD-10-CM | POA: Diagnosis not present

## 2013-12-09 ENCOUNTER — Ambulatory Visit: Payer: Medicare HMO | Admitting: Physical Therapy

## 2013-12-09 DIAGNOSIS — M25562 Pain in left knee: Secondary | ICD-10-CM | POA: Diagnosis not present

## 2013-12-10 ENCOUNTER — Ambulatory Visit: Payer: Medicare HMO | Admitting: *Deleted

## 2013-12-10 DIAGNOSIS — M25562 Pain in left knee: Secondary | ICD-10-CM | POA: Diagnosis not present

## 2013-12-12 ENCOUNTER — Ambulatory Visit: Payer: Medicare HMO | Admitting: *Deleted

## 2013-12-12 ENCOUNTER — Telehealth: Payer: Self-pay | Admitting: *Deleted

## 2013-12-12 ENCOUNTER — Other Ambulatory Visit: Payer: Self-pay | Admitting: Family Medicine

## 2013-12-12 ENCOUNTER — Encounter (HOSPITAL_COMMUNITY): Payer: Self-pay | Admitting: Interventional Cardiology

## 2013-12-12 DIAGNOSIS — M129 Arthropathy, unspecified: Secondary | ICD-10-CM

## 2013-12-12 DIAGNOSIS — M25562 Pain in left knee: Secondary | ICD-10-CM | POA: Diagnosis not present

## 2013-12-12 MED ORDER — HYDROCODONE-ACETAMINOPHEN 7.5-300 MG PO TABS: 1.0000 | ORAL_TABLET | Freq: Three times a day (TID) | ORAL | Status: DC

## 2013-12-12 NOTE — Telephone Encounter (Signed)
Script for pain medication is ready.

## 2013-12-12 NOTE — Telephone Encounter (Signed)
Last filled 11/15/13, last seen 07/30/13 by you. Rx will print

## 2013-12-17 ENCOUNTER — Ambulatory Visit: Payer: Medicare HMO | Admitting: *Deleted

## 2013-12-17 DIAGNOSIS — M25562 Pain in left knee: Secondary | ICD-10-CM | POA: Diagnosis not present

## 2013-12-19 ENCOUNTER — Ambulatory Visit: Payer: Medicare HMO | Admitting: *Deleted

## 2013-12-19 DIAGNOSIS — M25562 Pain in left knee: Secondary | ICD-10-CM | POA: Diagnosis not present

## 2013-12-24 ENCOUNTER — Ambulatory Visit: Payer: Medicare HMO | Admitting: *Deleted

## 2013-12-24 DIAGNOSIS — M25562 Pain in left knee: Secondary | ICD-10-CM | POA: Diagnosis not present

## 2013-12-30 ENCOUNTER — Encounter: Payer: Medicare HMO | Admitting: Physical Therapy

## 2014-01-01 ENCOUNTER — Ambulatory Visit: Payer: Medicare HMO | Admitting: *Deleted

## 2014-01-01 DIAGNOSIS — M25562 Pain in left knee: Secondary | ICD-10-CM | POA: Diagnosis not present

## 2014-01-06 ENCOUNTER — Ambulatory Visit: Payer: Medicare Other | Attending: Orthopedic Surgery | Admitting: Physical Therapy

## 2014-01-06 DIAGNOSIS — M25562 Pain in left knee: Secondary | ICD-10-CM | POA: Diagnosis not present

## 2014-01-06 DIAGNOSIS — M25662 Stiffness of left knee, not elsewhere classified: Secondary | ICD-10-CM | POA: Diagnosis not present

## 2014-01-12 ENCOUNTER — Other Ambulatory Visit: Payer: Self-pay | Admitting: Family Medicine

## 2014-01-22 ENCOUNTER — Other Ambulatory Visit: Payer: Self-pay | Admitting: Family Medicine

## 2014-01-30 ENCOUNTER — Ambulatory Visit (INDEPENDENT_AMBULATORY_CARE_PROVIDER_SITE_OTHER): Payer: Medicare Other | Admitting: Family Medicine

## 2014-01-30 ENCOUNTER — Encounter: Payer: Self-pay | Admitting: Family Medicine

## 2014-01-30 VITALS — BP 120/73 | HR 67 | Temp 97.6°F | Ht 71.0 in | Wt 243.0 lb

## 2014-01-30 DIAGNOSIS — I10 Essential (primary) hypertension: Secondary | ICD-10-CM | POA: Diagnosis not present

## 2014-01-30 DIAGNOSIS — J449 Chronic obstructive pulmonary disease, unspecified: Secondary | ICD-10-CM | POA: Diagnosis not present

## 2014-01-30 DIAGNOSIS — M1710 Unilateral primary osteoarthritis, unspecified knee: Secondary | ICD-10-CM

## 2014-01-30 DIAGNOSIS — M129 Arthropathy, unspecified: Secondary | ICD-10-CM | POA: Diagnosis not present

## 2014-01-30 DIAGNOSIS — E079 Disorder of thyroid, unspecified: Secondary | ICD-10-CM

## 2014-01-30 DIAGNOSIS — E785 Hyperlipidemia, unspecified: Secondary | ICD-10-CM

## 2014-01-30 MED ORDER — HYDROCODONE-ACETAMINOPHEN 7.5-300 MG PO TABS: 1.0000 | ORAL_TABLET | Freq: Three times a day (TID) | ORAL | Status: DC

## 2014-01-30 NOTE — Progress Notes (Signed)
Subjective:    Patient ID: [name], male    DOB: 07-May-1945, 69 y.o.   MRN: [id number]  HPI  69 year old male comes in today for 6 month f/u on chronic medical conditions which include arthritis, hypertension, hyperlipidemia and COPD. He also reports having tender skin lesions on scalp and left ear and would like these examined. We briefly discussed his lab work done 6 months ago which was all normal. He continues to take hydrocodone 3 a day for arthritic symptoms. He does not have chest pain and is followed by cardiologist. He does have some shortness of breath with exertion and if he starts wheezing he uses his albuterol as needed  Patient Active Problem List   Diagnosis Date Noted  . Hyperlipidemia 10/29/2013  . Dyspnea 09/02/2013  . Dysphagia, unspecified(787.20) 04/08/2013  . Colon adenoma 04/08/2013  . Need for diphtheria-tetanus-pertussis (Tdap) vaccine, adult/adolescent 04/04/2013  . Intermediate coronary syndrome 01/15/2013  . Chest pain with moderate risk of acute coronary syndrome- cath [facility] 01/15/13 01/15/2013  . Need for prophylactic vaccination against Streptococcus pneumoniae (pneumococcus) 12/24/2012  . Thyroid disease   . CAD (coronary artery disease)- prior CFX stenting   . Bilateral lower extremity edema 09/06/2011  . GERD 09/18/2008  . HYPERLIPIDEMIA 05/01/2007  . Essential hypertension 05/01/2007  . Arthritis of knee 05/01/2007  . COPD (chronic obstructive pulmonary disease) 05/01/2007  . GI BLEED 05/01/2007  . Arthropathy 05/01/2007  . SLEEP APNEA 05/01/2007   Outpatient Encounter Prescriptions as of 01/30/2014  Medication Sig  . albuterol (PROAIR HFA) 108 (90 BASE) MCG/ACT inhaler Inhale 2 puffs into the lungs every 6 (six) hours as needed for wheezing or shortness of breath.  Marland Kitchen aspirin 325 MG tablet Take 325 mg by mouth daily.    Marland Kitchen atenolol (TENORMIN) 50 MG tablet TAKE ONE TABLET BY MOUTH ONE TIME DAILY  . formoterol (FORADIL AEROLIZER) 12 MCG  capsule for inhaler Place 1 capsule (12 mcg total) into inhaler and inhale 2 (two) times daily.  . Hydrocodone-Acetaminophen 7.5-300 MG TABS Take 1 tablet by mouth 3 (three) times daily.  Marland Kitchen levothyroxine (SYNTHROID, LEVOTHROID) 175 MCG tablet TAKE ONE TABLET BY MOUTH ONE TIME DAILY  . lisinopril-hydrochlorothiazide (PRINZIDE,ZESTORETIC) 20-25 MG per tablet TAKE ONE TABLET BY MOUTH ONE TIME DAILY  . nitroGLYCERIN (NITROSTAT) 0.4 MG SL tablet Place 0.4 mg under the tongue every 5 (five) minutes as needed for chest pain.   . pantoprazole (PROTONIX) 40 MG tablet Take 1 tablet (40 mg total) by mouth daily.  . simvastatin (ZOCOR) 40 MG tablet TAKE ONE TABLET BY MOUTH AT BEDTIME  . Tiotropium Bromide-Olodaterol (STIOLTO RESPIMAT) 2.5-2.5 MCG/ACT AERS Inhale 2 puffs into the lungs daily.     Review of Systems  Constitutional: Negative.   HENT: Negative.   Eyes: Negative.   Respiratory: Negative.  Negative for shortness of breath.   Cardiovascular: Negative.  Negative for chest pain and leg swelling.  Gastrointestinal: Negative.   Genitourinary: Negative.   Musculoskeletal: Negative.   Skin: Negative.   Neurological: Negative.   Psychiatric/Behavioral: Negative.   All other systems reviewed and are negative.      Objective:   Physical Exam  Constitutional: He is oriented to person, place, and time. He appears well-developed and well-nourished.  HENT:  Head: Normocephalic.  Right Ear: External ear normal.  Left Ear: External ear normal.  Nose: Nose normal.  Mouth/Throat: Oropharynx is clear and moist.  Eyes: Conjunctivae and EOM are normal. Pupils are equal, round, and reactive  to light.  Neck: Normal range of motion. Neck supple.  Cardiovascular: Normal rate, regular rhythm, normal heart sounds and intact distal pulses.   Pulmonary/Chest: Effort normal and breath sounds normal.  Abdominal: Soft. Bowel sounds are normal.  Musculoskeletal: Normal range of motion.  Neurological: He is  alert and oriented to person, place, and time.  Skin: Skin is warm and dry.  The rough places on his scalp and left ear represent actinic keratosis. I offered to freeze them today but he said will maybe wait and do a little later time  Psychiatric: He has a normal mood and affect. His behavior is normal. Judgment and thought content normal.    BP 120/73 mmHg  Pulse 67  Temp(Src) 97.6 F (36.4 C) (Oral)  Ht 5\' 11"  (1.803 m)  Wt 243 lb (110.224 kg)  BMI 33.91 kg/m2       Assessment & Plan:  1. Thyroid disease TSH was normal 6 months ago continue on same dose and recheck at one year  2. Hyperlipidemia Lipids were within normal limits tolerating statin well recheck at 6 months  3. Essential hypertension Blood pressure is excellent today at 120/73  4. Chronic obstructive pulmonary disease, unspecified COPD, unspecified chronic bronchitis type As noted above he uses to cope him regularly twice a day and albuterol as needed  5. Arthritis of knee   6. Arthropathy  - Hydrocodone-Acetaminophen 7.5-300 MG TABS; Take 1 tablet by mouth 3 (three) times daily.  Dispense: 90 each; Refill: 0  Wardell Honour MD

## 2014-02-09 ENCOUNTER — Other Ambulatory Visit: Payer: Self-pay | Admitting: Family Medicine

## 2014-02-25 ENCOUNTER — Ambulatory Visit: Payer: Medicare Other | Admitting: Family Medicine

## 2014-03-07 ENCOUNTER — Other Ambulatory Visit: Payer: Self-pay | Admitting: Family Medicine

## 2014-03-07 DIAGNOSIS — M129 Arthropathy, unspecified: Secondary | ICD-10-CM

## 2014-03-07 MED ORDER — HYDROCODONE-ACETAMINOPHEN 7.5-300 MG PO TABS: 1.0000 | ORAL_TABLET | Freq: Three times a day (TID) | ORAL | Status: DC

## 2014-03-07 NOTE — Telephone Encounter (Signed)
Patient aware rx ready to be picked up 

## 2014-03-07 NOTE — Telephone Encounter (Signed)
Last seen and filled 01/30/14 by Sabra Heck. Rx will print

## 2014-03-19 ENCOUNTER — Other Ambulatory Visit: Payer: Self-pay | Admitting: *Deleted

## 2014-03-19 MED ORDER — LEVOTHYROXINE SODIUM 175 MCG PO TABS: 175.0000 ug | ORAL_TABLET | Freq: Every day | ORAL | Status: DC

## 2014-04-04 ENCOUNTER — Other Ambulatory Visit (INDEPENDENT_AMBULATORY_CARE_PROVIDER_SITE_OTHER): Payer: Self-pay | Admitting: Internal Medicine

## 2014-04-15 ENCOUNTER — Ambulatory Visit (INDEPENDENT_AMBULATORY_CARE_PROVIDER_SITE_OTHER): Payer: Medicare Other

## 2014-04-15 ENCOUNTER — Encounter: Payer: Self-pay | Admitting: Family Medicine

## 2014-04-15 ENCOUNTER — Ambulatory Visit (INDEPENDENT_AMBULATORY_CARE_PROVIDER_SITE_OTHER): Payer: Medicare Other | Admitting: Family Medicine

## 2014-04-15 VITALS — BP 108/69 | HR 69 | Temp 97.2°F | Ht 71.0 in | Wt 244.0 lb

## 2014-04-15 DIAGNOSIS — M129 Arthropathy, unspecified: Secondary | ICD-10-CM

## 2014-04-15 DIAGNOSIS — M25421 Effusion, right elbow: Secondary | ICD-10-CM

## 2014-04-15 DIAGNOSIS — M25521 Pain in right elbow: Secondary | ICD-10-CM

## 2014-04-15 DIAGNOSIS — S46111A Strain of muscle, fascia and tendon of long head of biceps, right arm, initial encounter: Secondary | ICD-10-CM | POA: Diagnosis not present

## 2014-04-15 DIAGNOSIS — S46211A Strain of muscle, fascia and tendon of other parts of biceps, right arm, initial encounter: Secondary | ICD-10-CM

## 2014-04-15 MED ORDER — HYDROCODONE-ACETAMINOPHEN 7.5-300 MG PO TABS: 1.0000 | ORAL_TABLET | ORAL | Status: DC | PRN

## 2014-04-15 MED ORDER — KETOROLAC TROMETHAMINE 60 MG/2ML IM SOLN
60.0000 mg | Freq: Once | INTRAMUSCULAR | Status: AC
  Administered 2014-04-15: 60 mg via INTRAMUSCULAR

## 2014-04-15 NOTE — Patient Instructions (Addendum)
Colace 100 mg twice daily to soften stool.  Metamucil 1 tablespoon twice daily to prevent constipation.  MiraLAX one scoop twice daily as needed for constipation.  Wear sling at all times except in the shower until cleared by orthopedics.

## 2014-04-15 NOTE — Progress Notes (Signed)
Subjective:  Patient ID: [name], male    DOB: [birth year]  Age: 69 y.o. MRN: [id number]  CC: Arm Pain   HPI [name] presents for acute pain in the right upper arm just above the elbow. He states that he was in his usual state of good health marked by arthritis that causes a lot of stiffness when during the night last night he rolled over and tried to lift himself up with the right arm to get out of bed to go to the restroom. He felt a sudden rubber band-like pop and intense 10/10 pain in the upper right arm. Now it's difficult to move the arm at all.  History [name] has a past medical history of Coronary atherosclerosis of unspecified type of vessel, native or graft; Unspecified essential hypertension; Other and unspecified hyperlipidemia; Chronic airway obstruction, not elsewhere classified; Arthropathy, unspecified, site unspecified; Thyroid disease; Anginal pain; Hypothyroidism; Headache(784.0); Arthritis; Unspecified sleep apnea; and Shortness of breath.   He has past surgical history that includes Coronary stent placement; Hip Arthroplasty; Ankle surgery; Hemorrhoid surgery; Tonsillectomy; Cardiac catheterization (01/15/2013); Colonoscopy (N/A, 05/02/2013); Esophagogastroduodenoscopy (N/A, 05/02/2013); Balloon dilation (N/A, 05/02/2013); maloney dilation (N/A, 05/02/2013); Savory dilation (N/A, 05/02/2013); left heart catheterization with coronary angiogram (N/A, 01/15/2013); and Knee surgery (Left, 12/2013).   His family history includes CAD in his father; Stroke in his mother. There is no history of Colon cancer.He reports that he quit smoking about 38 years ago. His smoking use included Cigarettes. He has a 60 pack-year smoking history. He quit smokeless tobacco use about 14 years ago. His smokeless tobacco use included Chew. He reports that he does not drink alcohol or use illicit drugs.  Current Outpatient Prescriptions on File Prior to Visit  Medication Sig Dispense Refill    . albuterol (PROAIR HFA) 108 (90 BASE) MCG/ACT inhaler Inhale 2 puffs into the lungs every 6 (six) hours as needed for wheezing or shortness of breath. 1 Inhaler 2  . aspirin 325 MG tablet Take 325 mg by mouth daily.      Marland Kitchen atenolol (TENORMIN) 50 MG tablet TAKE ONE TABLET BY MOUTH ONE TIME DAILY 90 tablet 1  . levothyroxine (SYNTHROID, LEVOTHROID) 175 MCG tablet Take 1 tablet (175 mcg total) by mouth daily. 30 tablet 0  . lisinopril-hydrochlorothiazide (PRINZIDE,ZESTORETIC) 20-25 MG per tablet TAKE ONE TABLET BY MOUTH ONE TIME DAILY 90 tablet 0  . nitroGLYCERIN (NITROSTAT) 0.4 MG SL tablet Place 0.4 mg under the tongue every 5 (five) minutes as needed for chest pain.     . pantoprazole (PROTONIX) 40 MG tablet TAKE 1 TABLET (40 MG TOTAL) BY MOUTH DAILY. 90 tablet 2  . simvastatin (ZOCOR) 40 MG tablet TAKE ONE TABLET BY MOUTH AT BEDTIME 30 tablet 5  . Tiotropium Bromide-Olodaterol (STIOLTO RESPIMAT) 2.5-2.5 MCG/ACT AERS Inhale 2 puffs into the lungs daily. 1 Inhaler 6   No current facility-administered medications on file prior to visit.    ROS Review of Systems  Constitutional: Negative for fever, chills, diaphoresis and unexpected weight change.  HENT: Negative for congestion, hearing loss, rhinorrhea, sore throat and trouble swallowing.   Gastrointestinal: Negative for nausea, vomiting, abdominal pain, diarrhea, constipation and abdominal distention.  Endocrine: Negative for cold intolerance and heat intolerance.  Genitourinary: Negative for dysuria, hematuria and flank pain.  Musculoskeletal: Negative for joint swelling and arthralgias.  Skin: Negative for rash.  Neurological: Negative for dizziness and headaches.  Psychiatric/Behavioral: Negative for dysphoric mood, decreased concentration and agitation. The patient is not nervous/anxious.  Objective:  BP 108/69 mmHg  Pulse 69  Temp(Src) 97.2 F (36.2 C) (Oral)  Ht 5\' 11"  (1.803 m)  Wt 244 lb (110.678 kg)  BMI 34.05  kg/m2  BP Readings from Last 3 Encounters:  04/15/14 108/69  01/30/14 120/73  10/29/13 104/70    Wt Readings from Last 3 Encounters:  04/15/14 244 lb (110.678 kg)  01/30/14 243 lb (110.224 kg)  10/29/13 249 lb 14.4 oz (113.354 kg)     Physical Exam  Constitutional: He is oriented to person, place, and time. He appears well-developed and well-nourished. He appears distressed.  HENT:  Head: Normocephalic and atraumatic.  Eyes: Pupils are equal, round, and reactive to light.  Neck: Normal range of motion. Neck supple.  Cardiovascular: Normal rate and regular rhythm.  Exam reveals no gallop and no friction rub.   No murmur heard. Pulmonary/Chest: Breath sounds normal.  Abdominal: Soft.  Musculoskeletal: He exhibits tenderness (There is marked tenderness and decreased range of motion at the distal aspect of the right biceps. There is loss of muscle mass and tone).  Neurological: He is alert and oriented to person, place, and time.  The right upper extremity is neurovascularly intact for pulses and light touch.    Lab Results  Component Value Date   HGBA1C  08/06/2008    5.8 (NOTE) The ADA recommends the following therapeutic goal for glycemic control related to Hgb A1c measurement: Goal of therapy: <6.5 Hgb A1c  Reference: American Diabetes Association: Clinical Practice Recommendations 2010, Diabetes Care, 2010, 33: (Suppl  1).   HGBA1C 5.9 04/12/2006    Lab Results  Component Value Date   WBC 6.0 01/15/2013   HGB 14.3 01/15/2013   HCT 42.0 01/15/2013   PLT 203 01/15/2013   GLUCOSE 107* 04/04/2013   CHOL 142 07/30/2013   TRIG 117 07/30/2013   HDL 47 07/30/2013   LDLDIRECT 70.0 04/12/2006   LDLCALC 72 07/30/2013   ALT 12 07/30/2013   AST 14 07/30/2013   NA 140 04/04/2013   K 4.3 04/04/2013   CL 100 04/04/2013   CREATININE 1.07 04/04/2013   BUN 23 04/04/2013   CO2 22 04/04/2013   TSH 0.909 07/30/2013   INR 0.98 01/15/2013   HGBA1C  08/06/2008    5.8 (NOTE) The  ADA recommends the following therapeutic goal for glycemic control related to Hgb A1c measurement: Goal of therapy: <6.5 Hgb A1c  Reference: American Diabetes Association: Clinical Practice Recommendations 2010, Diabetes Care, 2010, 33: (Suppl  1).    US Abdomen Complete  05/09/2013   CLINICAL DATA:  Epigastric/right upper quadrant abdominal pain x6 weeks  EXAM: ULTRASOUND ABDOMEN COMPLETE  COMPARISON:  None.  FINDINGS: Gallbladder:  No gallstones, gallbladder wall thickening, or pericholecystic fluid. Negative sonographic Murphy's sign.  Common bile duct:  Diameter: 5 mm.  Liver:  Hyperechoic hepatic parenchyma, suggesting hepatic steatosis. 14 x 13 x 12 mm cyst in the left hepatic lobe.  IVC:  No abnormality visualized.  Pancreas:  Poorly visualized due to overlying bowel gas.  Spleen:  Measures 5.9 cm.  Poorly visualized.  Right Kidney:  Length: 12.4 cm. 7.2 x 4.7 x 6.1 cm right lower pole renal cyst, with mildly irregular margins, but essentially simple internally. No hydronephrosis.  Left Kidney:  Length: 13.0 cm. 2.4 x 2.6 x 2.2 cm left lower pole renal cyst. No hydronephrosis.  Abdominal aorta:  No aneurysm visualized.  Atherosclerosis.  Other findings:  None.  IMPRESSION: Suspected hepatic steatosis.  1.4 cm left hepatic lobe cyst.  Bilateral renal cysts, as above.   Electronically Signed   By: Julian Hy M.D.   On: 05/09/2013 10:32    Assessment & Plan:   Hildred was seen today for arm pain.  Diagnoses and all orders for this visit:  Pain and swelling of elbow, right Orders: -     DG Elbow 2 Views Right; Future -     Ambulatory referral to Orthopedic Surgery -     ketorolac (TORADOL) injection 60 mg; Inject 2 mLs (60 mg total) into the muscle once.  Biceps tendon rupture, right, initial encounter Orders: -     Ambulatory referral to Orthopedic Surgery -     ketorolac (TORADOL) injection 60 mg; Inject 2 mLs (60 mg total) into the muscle once.  Arthropathy Orders: -      ketorolac (TORADOL) injection 60 mg; Inject 2 mLs (60 mg total) into the muscle once. -     Hydrocodone-Acetaminophen 7.5-300 MG TABS; Take 1 tablet by mouth every 4 (four) hours as needed. 4 severe pain in right arm.   I have discontinued Mr. Petrelli's formoterol. I have also changed his Hydrocodone-Acetaminophen. Additionally, I am having him maintain his aspirin, nitroGLYCERIN, albuterol, atenolol, Tiotropium Bromide-Olodaterol, lisinopril-hydrochlorothiazide, simvastatin, levothyroxine, and pantoprazole. We administered ketorolac.  Meds ordered this encounter  Medications  . ketorolac (TORADOL) injection 60 mg    Sig:   . Hydrocodone-Acetaminophen 7.5-300 MG TABS    Sig: Take 1 tablet by mouth every 4 (four) hours as needed. 4 severe pain in right arm.    Dispense:  120 each    Refill:  0     Follow-up: Immediate referral to orthopedics was made. He is to see fourth of this afternoon at 3 PM for consideration for surgical repair  Claretta Fraise, M.D.

## 2014-04-16 ENCOUNTER — Other Ambulatory Visit: Payer: Self-pay | Admitting: Family Medicine

## 2014-04-28 ENCOUNTER — Other Ambulatory Visit: Payer: Self-pay | Admitting: Family Medicine

## 2014-05-09 ENCOUNTER — Other Ambulatory Visit: Payer: Self-pay | Admitting: Family Medicine

## 2014-05-13 ENCOUNTER — Telehealth: Payer: Self-pay | Admitting: Cardiology

## 2014-05-13 ENCOUNTER — Other Ambulatory Visit (INDEPENDENT_AMBULATORY_CARE_PROVIDER_SITE_OTHER): Payer: Medicare Other

## 2014-05-13 ENCOUNTER — Ambulatory Visit (INDEPENDENT_AMBULATORY_CARE_PROVIDER_SITE_OTHER)
Admission: RE | Admit: 2014-05-13 | Discharge: 2014-05-13 | Disposition: A | Discharge status: Home / Self Care | Payer: Medicare Other | Source: Ambulatory Visit | Attending: Pulmonary Disease | Admitting: Pulmonary Disease

## 2014-05-13 ENCOUNTER — Ambulatory Visit (INDEPENDENT_AMBULATORY_CARE_PROVIDER_SITE_OTHER): Payer: Medicare Other | Admitting: Pulmonary Disease

## 2014-05-13 ENCOUNTER — Encounter: Payer: Self-pay | Admitting: Pulmonary Disease

## 2014-05-13 VITALS — BP 124/68 | HR 82 | Ht 71.5 in | Wt 244.0 lb

## 2014-05-13 DIAGNOSIS — R0600 Dyspnea, unspecified: Secondary | ICD-10-CM | POA: Diagnosis not present

## 2014-05-13 DIAGNOSIS — J438 Other emphysema: Secondary | ICD-10-CM

## 2014-05-13 DIAGNOSIS — R0602 Shortness of breath: Secondary | ICD-10-CM

## 2014-05-13 LAB — CBC WITH DIFFERENTIAL/PLATELET
BASOS PCT: 0.3 % (ref 0.0–3.0)
Basophils Absolute: 0 10*3/uL (ref 0.0–0.1)
EOS PCT: 2.3 % (ref 0.0–5.0)
Eosinophils Absolute: 0.2 10*3/uL (ref 0.0–0.7)
HCT: 48 % (ref 39.0–52.0)
HEMOGLOBIN: 16.6 g/dL (ref 13.0–17.0)
LYMPHS PCT: 25.2 % (ref 12.0–46.0)
Lymphs Abs: 1.7 10*3/uL (ref 0.7–4.0)
MCHC: 34.5 g/dL (ref 30.0–36.0)
MCV: 90 fl (ref 78.0–100.0)
MONOS PCT: 13.6 % — AB (ref 3.0–12.0)
Monocytes Absolute: 0.9 10*3/uL (ref 0.1–1.0)
NEUTROS ABS: 4 10*3/uL (ref 1.4–7.7)
Neutrophils Relative %: 58.6 % (ref 43.0–77.0)
Platelets: 253 10*3/uL (ref 150.0–400.0)
RBC: 5.33 Mil/uL (ref 4.22–5.81)
RDW: 13.5 % (ref 11.5–15.5)
WBC: 6.8 10*3/uL (ref 4.0–10.5)

## 2014-05-13 NOTE — Assessment & Plan Note (Signed)
He only has moderate COPD but he is complaining of worsening dyspnea. See discussion below. He has not had any exacerbations to suggest that his COPD has dramatically worsened. He remains compliant with his bronchodilator therapy.  Plan: -Continue Stiolto -I recommended pulmonary rehabilitation but he lives too far away so I have encouraged him to join a local gym. -Follow-up 3 months

## 2014-05-13 NOTE — Telephone Encounter (Signed)
Returned call to patient no answer.LMTC. 

## 2014-05-13 NOTE — Telephone Encounter (Signed)
Pt called in stating that his pulmonary doctor thinks he should follow up with Dr. Martinique asap because he is having a lot of SOB with exertion. Please call back  Thanks

## 2014-05-13 NOTE — Patient Instructions (Signed)
We will arrange a pulmonary function test, a chest x-ray, and a blood test and call you with those results I encourage you to join the local gym and your community and start a regular exercise routine. As I described you today in clinic I think he should start with 5 minutes of walking daily and gradually increase over several months to a goal of 30 minutes daily Sure you tell your cardiologist that you have been experiencing some shortness of breath Take albuterol 2 puffs 10 minutes before any exercise We'll see you back in 3 months or sooner if needed

## 2014-05-13 NOTE — Assessment & Plan Note (Signed)
His exertional dyspnea has worsened since the last visit. As described above he only has moderate COPD without frequent exacerbations. He has not smoked in over 30 years and so the likelihood that it is dramatically worsened in the last 3-6 months is quite low. I believe that the primary reason for his shortness of breath is deconditioning. However, he does have a history of coronary disease. In 2014 and let his Lexiscan was normal. He has no complaints of bleeding. His neurologic exam is normal.  Plan: Check CBC Check chest x-ray Full pulmonary function testing Talked to cardiologist about dyspnea I have encouraged him to start a regular exercise routine

## 2014-05-13 NOTE — Progress Notes (Signed)
Subjective:    Patient ID: [name], male    DOB: Nov 26, 1945, 69 y.o.   MRN: [id number]  Synopsis: GOLD COPD grade A, first saw [facility] in 2014. 12/05/2012 simple spirometry> ratio 70% (predicted ratio 77%), clear airflow obstruction on flow volume loop, FEV1 2.50 L (60% predicted)  HPI Chief Complaint  Patient presents with  . Follow-up    Pt c/o sob with fast exertion or with hills. CAT score 10   05/13/2014 ROV> [name] says that he is still running out of air when climbing a hill, going up stairs.  It is worse since the last visit.  It is associated with chest tightness and wheezing. He feels like there is something obstructing his airflow in his throat primarily when he tries to breathe in.  He had trouble climbing a hill last week.  He continues to take the [employer].  He says that albuterol helps. He is not exercising regularly.  He denies chest pain or leg swelling.  Past Medical History  Diagnosis Date  . Coronary atherosclerosis of unspecified type of vessel, native or graft   . Unspecified essential hypertension   . Other and unspecified hyperlipidemia   . Chronic airway obstruction, not elsewhere classified   . Arthropathy, unspecified, site unspecified   . Thyroid disease     hypothyroid  . Anginal pain   . Hypothyroidism   . Headache(784.0)   . Arthritis     OSTEO BACK KNEES HIPS & HANDS  . Unspecified sleep apnea     Dr. Jacelyn Grip at [facility] told pt he had sleep apnea, but never sent him for a sleep study so he doesn't wear a CPAP  . Shortness of breath     with exertion     Review of Systems  Constitutional: Positive for fatigue. Negative for fever and chills.  HENT: Negative for postnasal drip, rhinorrhea and sinus pressure.   Respiratory: Positive for shortness of breath and wheezing. Negative for cough.   Cardiovascular: Negative for chest pain, palpitations and leg swelling.       Objective:   Physical Exam Filed Vitals:   05/13/14  0858  BP: 124/68  Pulse: 82  Height: 5' 11.5" (1.816 m)  Weight: 244 lb (110.678 kg)  SpO2: 94%   RA  Gen: well appearing, no acute distress HEENT: NCAT, EOMi, OP clear  PULM: CTA B CV: RRR, no mgr, no JVD GI: BS+, soft, nontender,  Ext: warm, no edema, no clubbing, no cyanosis Derm: no rash or skin breakdown Neuro: A&Ox4, MAEW  October 2015 cardiology notes reviewed, he had had a recent negative Lexiscan, he was complaining of no anginal symptoms at that time and no medications were changed 2014 chest x-ray images reviewed, mild hyperinflation but otherwise no interstitial or parenchymal changes     Assessment & Plan:   COPD (chronic obstructive pulmonary disease) He only has moderate COPD but he is complaining of worsening dyspnea. See discussion below. He has not had any exacerbations to suggest that his COPD has dramatically worsened. He remains compliant with his bronchodilator therapy.  Plan: -Continue Stiolto -I recommended pulmonary rehabilitation but he lives too far away so I have encouraged him to join a local gym. -Follow-up 3 months   Dyspnea His exertional dyspnea has worsened since the last visit. As described above he only has moderate COPD without frequent exacerbations. He has not smoked in over 30 years and so the likelihood that it is dramatically worsened in the last  3-6 months is quite low. I believe that the primary reason for his shortness of breath is deconditioning. However, he does have a history of coronary disease. In 2014 and let his Lexiscan was normal. He has no complaints of bleeding. His neurologic exam is normal.  Plan: Check CBC Check chest x-ray Full pulmonary function testing Talked to cardiologist about dyspnea I have encouraged him to start a regular exercise routine     Updated Medication List Outpatient Encounter Prescriptions as of 05/13/2014  Medication Sig  . albuterol (PROAIR HFA) 108 (90 BASE) MCG/ACT inhaler Inhale 2  puffs into the lungs every 6 (six) hours as needed for wheezing or shortness of breath.  Marland Kitchen aspirin 325 MG tablet Take 325 mg by mouth daily.    Marland Kitchen atenolol (TENORMIN) 50 MG tablet TAKE ONE TABLET BY MOUTH ONE TIME DAILY  . Hydrocodone-Acetaminophen 7.5-300 MG TABS Take 1 tablet by mouth every 4 (four) hours as needed. 4 severe pain in right arm.  . levothyroxine (SYNTHROID, LEVOTHROID) 175 MCG tablet TAKE 1 TABLET (175 MCG TOTAL) BY MOUTH DAILY.  Marland Kitchen lisinopril-hydrochlorothiazide (PRINZIDE,ZESTORETIC) 20-25 MG per tablet TAKE ONE TABLET BY MOUTH ONE TIME DAILY  . nitroGLYCERIN (NITROSTAT) 0.4 MG SL tablet Place 0.4 mg under the tongue every 5 (five) minutes as needed for chest pain.   . pantoprazole (PROTONIX) 40 MG tablet TAKE 1 TABLET (40 MG TOTAL) BY MOUTH DAILY.  . simvastatin (ZOCOR) 40 MG tablet TAKE ONE TABLET BY MOUTH AT BEDTIME  . Tiotropium Bromide-Olodaterol (STIOLTO RESPIMAT) 2.5-2.5 MCG/ACT AERS Inhale 2 puffs into the lungs daily.   No facility-administered encounter medications on file as of 05/13/2014.

## 2014-05-15 ENCOUNTER — Ambulatory Visit (INDEPENDENT_AMBULATORY_CARE_PROVIDER_SITE_OTHER): Payer: Medicare Other | Admitting: Cardiology

## 2014-05-15 ENCOUNTER — Emergency Department (HOSPITAL_COMMUNITY): Payer: Medicare Other

## 2014-05-15 ENCOUNTER — Encounter: Payer: Self-pay | Admitting: Cardiology

## 2014-05-15 ENCOUNTER — Other Ambulatory Visit: Payer: Self-pay

## 2014-05-15 ENCOUNTER — Observation Stay (HOSPITAL_COMMUNITY)
Admission: EM | Admit: 2014-05-15 | Discharge: 2014-05-17 | Disposition: A | Discharge status: Home / Self Care | Payer: Medicare Other | Attending: Cardiology | Admitting: Cardiology

## 2014-05-15 ENCOUNTER — Encounter (HOSPITAL_COMMUNITY): Payer: Self-pay | Admitting: Emergency Medicine

## 2014-05-15 ENCOUNTER — Other Ambulatory Visit (HOSPITAL_COMMUNITY): Payer: Self-pay

## 2014-05-15 VITALS — BP 120/64 | HR 56 | Ht 72.0 in | Wt 242.0 lb

## 2014-05-15 DIAGNOSIS — M479 Spondylosis, unspecified: Secondary | ICD-10-CM | POA: Diagnosis not present

## 2014-05-15 DIAGNOSIS — R079 Chest pain, unspecified: Secondary | ICD-10-CM

## 2014-05-15 DIAGNOSIS — E785 Hyperlipidemia, unspecified: Secondary | ICD-10-CM | POA: Diagnosis not present

## 2014-05-15 DIAGNOSIS — I10 Essential (primary) hypertension: Secondary | ICD-10-CM | POA: Diagnosis present

## 2014-05-15 DIAGNOSIS — J449 Chronic obstructive pulmonary disease, unspecified: Secondary | ICD-10-CM | POA: Diagnosis not present

## 2014-05-15 DIAGNOSIS — Z7982 Long term (current) use of aspirin: Secondary | ICD-10-CM | POA: Insufficient documentation

## 2014-05-15 DIAGNOSIS — G4730 Sleep apnea, unspecified: Secondary | ICD-10-CM | POA: Diagnosis not present

## 2014-05-15 DIAGNOSIS — I2510 Atherosclerotic heart disease of native coronary artery without angina pectoris: Secondary | ICD-10-CM | POA: Diagnosis present

## 2014-05-15 DIAGNOSIS — Z79899 Other long term (current) drug therapy: Secondary | ICD-10-CM | POA: Insufficient documentation

## 2014-05-15 DIAGNOSIS — E876 Hypokalemia: Secondary | ICD-10-CM | POA: Insufficient documentation

## 2014-05-15 DIAGNOSIS — E039 Hypothyroidism, unspecified: Secondary | ICD-10-CM | POA: Diagnosis not present

## 2014-05-15 DIAGNOSIS — R072 Precordial pain: Secondary | ICD-10-CM | POA: Diagnosis present

## 2014-05-15 DIAGNOSIS — I25110 Atherosclerotic heart disease of native coronary artery with unstable angina pectoris: Secondary | ICD-10-CM | POA: Diagnosis not present

## 2014-05-15 DIAGNOSIS — M159 Polyosteoarthritis, unspecified: Secondary | ICD-10-CM | POA: Diagnosis not present

## 2014-05-15 DIAGNOSIS — Z87891 Personal history of nicotine dependence: Secondary | ICD-10-CM | POA: Diagnosis not present

## 2014-05-15 DIAGNOSIS — Z955 Presence of coronary angioplasty implant and graft: Secondary | ICD-10-CM | POA: Insufficient documentation

## 2014-05-15 DIAGNOSIS — I200 Unstable angina: Secondary | ICD-10-CM | POA: Diagnosis present

## 2014-05-15 HISTORY — DX: Personal history of other medical treatment: Z92.89

## 2014-05-15 LAB — BASIC METABOLIC PANEL
Anion gap: 9 (ref 5–15)
BUN: 17 mg/dL (ref 6–20)
CO2: 25 mmol/L (ref 22–32)
Calcium: 9.1 mg/dL (ref 8.9–10.3)
Chloride: 105 mmol/L (ref 101–111)
Creatinine, Ser: 1.16 mg/dL (ref 0.61–1.24)
GFR calc Af Amer: >60 mL/min (ref >60)
GLUCOSE: 103 mg/dL — AB (ref 65–99)
POTASSIUM: 3.9 mmol/L (ref 3.5–5.1)
Sodium: 139 mmol/L (ref 135–145)

## 2014-05-15 LAB — CBC WITH DIFFERENTIAL/PLATELET
Basophils Absolute: 0 10*3/uL (ref 0.0–0.1)
Basophils Relative: 0 % (ref 0–1)
Eosinophils Absolute: 0.2 10*3/uL (ref 0.0–0.7)
Eosinophils Relative: 3 % (ref 0–5)
HEMATOCRIT: 44.7 % (ref 39.0–52.0)
Hemoglobin: 15 g/dL (ref 13.0–17.0)
LYMPHS ABS: 2 10*3/uL (ref 0.7–4.0)
LYMPHS PCT: 39 % (ref 12–46)
MCH: 30.3 pg (ref 26.0–34.0)
MCHC: 33.6 g/dL (ref 30.0–36.0)
MCV: 90.3 fL (ref 78.0–100.0)
Monocytes Absolute: 0.7 10*3/uL (ref 0.1–1.0)
Monocytes Relative: 13 % — ABNORMAL HIGH (ref 3–12)
Neutro Abs: 2.3 10*3/uL (ref 1.7–7.7)
Neutrophils Relative %: 45 % (ref 43–77)
PLATELETS: 200 10*3/uL (ref 150–400)
RBC: 4.95 MIL/uL (ref 4.22–5.81)
RDW: 13.1 % (ref 11.5–15.5)
WBC: 5.1 10*3/uL (ref 4.0–10.5)

## 2014-05-15 LAB — HEPATIC FUNCTION PANEL
ALK PHOS: 43 U/L (ref 38–126)
ALT: 12 U/L — AB (ref 17–63)
AST: 20 U/L (ref 15–41)
Albumin: 3.6 g/dL (ref 3.5–5.0)
BILIRUBIN TOTAL: 1.1 mg/dL (ref 0.3–1.2)
Bilirubin, Direct: 0.3 mg/dL (ref 0.1–0.5)
Indirect Bilirubin: 0.8 mg/dL (ref 0.3–0.9)
TOTAL PROTEIN: 6.4 g/dL — AB (ref 6.5–8.1)

## 2014-05-15 LAB — TSH: TSH: 0.756 u[IU]/mL (ref 0.350–4.500)

## 2014-05-15 LAB — I-STAT TROPONIN, ED: Troponin i, poc: 0 ng/mL (ref 0.00–0.08)

## 2014-05-15 LAB — HEPARIN LEVEL (UNFRACTIONATED): HEPARIN UNFRACTIONATED: 0.51 [IU]/mL (ref 0.30–0.70)

## 2014-05-15 LAB — TROPONIN I: Troponin I: <0.03 ng/mL (ref <0.031)

## 2014-05-15 LAB — MRSA PCR SCREENING: MRSA by PCR: NEGATIVE

## 2014-05-15 LAB — T4, FREE: FREE T4: 1.26 ng/dL — AB (ref 0.61–1.12)

## 2014-05-15 MED ORDER — SODIUM CHLORIDE 0.9 % WEIGHT BASED INFUSION: 3.0000 mL/kg/h | INTRAVENOUS | Status: DC

## 2014-05-15 MED ORDER — ACETAMINOPHEN 325 MG PO TABS
650.0000 mg | ORAL_TABLET | ORAL | Status: DC | PRN
  Administered 2014-05-15: 650 mg via ORAL
  Filled 2014-05-15: qty 2

## 2014-05-15 MED ORDER — ASPIRIN 300 MG RE SUPP: 300.0000 mg | RECTAL | Status: AC

## 2014-05-15 MED ORDER — ASPIRIN 81 MG PO CHEW
81.0000 mg | CHEWABLE_TABLET | ORAL | Status: AC
  Administered 2014-05-16: 81 mg via ORAL
  Filled 2014-05-15: qty 1

## 2014-05-15 MED ORDER — SIMVASTATIN 40 MG PO TABS
40.0000 mg | ORAL_TABLET | Freq: Every day | ORAL | Status: DC
  Filled 2014-05-15: qty 1

## 2014-05-15 MED ORDER — NITROGLYCERIN IN D5W 200-5 MCG/ML-% IV SOLN
5.0000 ug/min | INTRAVENOUS | Status: DC
  Administered 2014-05-15: 5 ug/min via INTRAVENOUS
  Filled 2014-05-15: qty 250

## 2014-05-15 MED ORDER — SODIUM CHLORIDE 0.9 % IV SOLN: 250.0000 mL | INTRAVENOUS | Status: DC | PRN

## 2014-05-15 MED ORDER — ONDANSETRON HCL 4 MG/2ML IJ SOLN: 4.0000 mg | Freq: Four times a day (QID) | INTRAMUSCULAR | Status: DC | PRN

## 2014-05-15 MED ORDER — HEPARIN (PORCINE) IN NACL 100-0.45 UNIT/ML-% IJ SOLN
1550.0000 [IU]/h | INTRAMUSCULAR | Status: DC
Start: 2014-05-15 — End: 2014-05-16
  Administered 2014-05-15: 1550 [IU]/h via INTRAVENOUS
  Filled 2014-05-15 (×3): qty 250

## 2014-05-15 MED ORDER — ASPIRIN 81 MG PO CHEW
81.0000 mg | CHEWABLE_TABLET | Freq: Every day | ORAL | Status: DC
  Administered 2014-05-17: 81 mg via ORAL
  Filled 2014-05-15: qty 1

## 2014-05-15 MED ORDER — ATENOLOL 25 MG PO TABS
50.0000 mg | ORAL_TABLET | Freq: Every day | ORAL | Status: DC
  Administered 2014-05-16 – 2014-05-17 (×2): 50 mg via ORAL
  Filled 2014-05-15 (×2): qty 2

## 2014-05-15 MED ORDER — HEPARIN BOLUS VIA INFUSION
4000.0000 [IU] | Freq: Once | INTRAVENOUS | Status: AC
  Administered 2014-05-15: 4000 [IU] via INTRAVENOUS
  Filled 2014-05-15: qty 4000

## 2014-05-15 MED ORDER — PANTOPRAZOLE SODIUM 40 MG PO TBEC
40.0000 mg | DELAYED_RELEASE_TABLET | Freq: Every day | ORAL | Status: DC
  Administered 2014-05-16 – 2014-05-17 (×2): 40 mg via ORAL
  Filled 2014-05-15 (×2): qty 1

## 2014-05-15 MED ORDER — HYDROCODONE-ACETAMINOPHEN 5-325 MG PO TABS: 1.0000 | ORAL_TABLET | Freq: Four times a day (QID) | ORAL | Status: DC | PRN

## 2014-05-15 MED ORDER — SODIUM CHLORIDE 0.9 % WEIGHT BASED INFUSION
1.0000 mL/kg/h | INTRAVENOUS | Status: DC
  Administered 2014-05-16: 1 mL/kg/h via INTRAVENOUS

## 2014-05-15 MED ORDER — NITROGLYCERIN 0.4 MG SL SUBL
0.4000 mg | SUBLINGUAL_TABLET | SUBLINGUAL | Status: DC | PRN
Start: 2014-05-15 — End: 2017-05-04

## 2014-05-15 MED ORDER — LEVOTHYROXINE SODIUM 100 MCG PO TABS: 175.0000 ug | ORAL_TABLET | Freq: Every day | ORAL | Status: DC

## 2014-05-15 MED ORDER — NITROGLYCERIN 0.4 MG SL SUBL: 0.4000 mg | SUBLINGUAL_TABLET | SUBLINGUAL | Status: DC | PRN

## 2014-05-15 MED ORDER — SODIUM CHLORIDE 0.9 % IJ SOLN: 3.0000 mL | INTRAMUSCULAR | Status: DC | PRN

## 2014-05-15 MED ORDER — TIOTROPIUM BROMIDE-OLODATEROL 2.5-2.5 MCG/ACT IN AERS: 2.0000 | INHALATION_SPRAY | Freq: Every day | RESPIRATORY_TRACT | Status: DC

## 2014-05-15 MED ORDER — SODIUM CHLORIDE 0.9 % IV BOLUS (SEPSIS)
250.0000 mL | Freq: Once | INTRAVENOUS | Status: AC
  Administered 2014-05-15: 250 mL via INTRAVENOUS

## 2014-05-15 MED ORDER — ASPIRIN 81 MG PO CHEW
324.0000 mg | CHEWABLE_TABLET | ORAL | Status: AC
  Filled 2014-05-15: qty 4

## 2014-05-15 MED ORDER — LISINOPRIL 20 MG PO TABS: 20.0000 mg | ORAL_TABLET | Freq: Every day | ORAL | Status: DC

## 2014-05-15 MED ORDER — ALBUTEROL SULFATE HFA 108 (90 BASE) MCG/ACT IN AERS: 2.0000 | INHALATION_SPRAY | Freq: Four times a day (QID) | RESPIRATORY_TRACT | Status: DC | PRN

## 2014-05-15 MED ORDER — ALBUTEROL SULFATE (2.5 MG/3ML) 0.083% IN NEBU: 2.5000 mg | INHALATION_SOLUTION | Freq: Four times a day (QID) | RESPIRATORY_TRACT | Status: DC | PRN

## 2014-05-15 MED ORDER — SODIUM CHLORIDE 0.9 % IJ SOLN
3.0000 mL | Freq: Two times a day (BID) | INTRAMUSCULAR | Status: DC
  Administered 2014-05-16 – 2014-05-17 (×3): 3 mL via INTRAVENOUS

## 2014-05-15 NOTE — Progress Notes (Addendum)
2330: Paged on call MD for BP 93/50 while on Nitro gtt. Patient asymptomatic. Orders for 213ml bolus received and to continue with Nitro gtt. Will continue to monitor.    0015: 273ml Bolus given. BP 79/45. On call MD paged. Orders given for 521ml bolus and to pause the Nitro gtt until SBP is >90. Will continue to monitor.

## 2014-05-15 NOTE — Progress Notes (Signed)
[name] Date of Birth: Jul 19, 1945 Medical Record #[id number]  History of Present Illness: [name] is seen as a work in today for evaluation of chest pain. He has a history of coronary disease. His initial cardiac event was in 2001. He had stenting of the third obtuse marginal vessel with a bare-metal stent. He had repeat cardiac catheterization in 2003 including intravascular ultrasound with no obstructive disease. In 2007 he presented with recurrent angina and had stenting of the proximal left circumflex with a Taxus stent.  In 2010 he had another cardiac catheterization. This demonstrated moderate disease in the RCA and 70%. His stents were still patent. He was treated medically.  He had a Lexiscan myoview study in Nov. 2014 which showed a small lateral fixed defect consistent with scar or artifact. No ischemia. EF 59%.  He presents now with a 7-10 day history of worsening dyspnea and chest pain. He first noted worsening dyspnea with any activity and feeling tired. He was seen by pulmonary and Dr. Lake Bells felt his symptoms were more cardiac. CXR was unremarkable. He reports 3 episodes of mid sternal chest pain radiating into jaw. At times it was severe. Did not take Ntg because his tablets were old and turned to powder. When seen in office today having 4/10 chest pain unrelieved with SL Ntg x 2.   Current Outpatient Prescriptions on File Prior to Visit  Medication Sig Dispense Refill  . albuterol (PROAIR HFA) 108 (90 BASE) MCG/ACT inhaler Inhale 2 puffs into the lungs every 6 (six) hours as needed for wheezing or shortness of breath. 1 Inhaler 2  . aspirin 325 MG tablet Take 325 mg by mouth daily.      Marland Kitchen atenolol (TENORMIN) 50 MG tablet TAKE ONE TABLET BY MOUTH ONE TIME DAILY 90 tablet 0  . Hydrocodone-Acetaminophen 7.5-300 MG TABS Take 1 tablet by mouth every 4 (four) hours as needed. 4 severe pain in right arm. 120 each 0  . levothyroxine (SYNTHROID, LEVOTHROID) 175 MCG tablet TAKE  1 TABLET (175 MCG TOTAL) BY MOUTH DAILY. 30 tablet 2  . lisinopril-hydrochlorothiazide (PRINZIDE,ZESTORETIC) 20-25 MG per tablet TAKE ONE TABLET BY MOUTH ONE TIME DAILY 90 tablet 0  . nitroGLYCERIN (NITROSTAT) 0.4 MG SL tablet Place 0.4 mg under the tongue every 5 (five) minutes as needed for chest pain.     . pantoprazole (PROTONIX) 40 MG tablet TAKE 1 TABLET (40 MG TOTAL) BY MOUTH DAILY. 90 tablet 2  . simvastatin (ZOCOR) 40 MG tablet TAKE ONE TABLET BY MOUTH AT BEDTIME 30 tablet 5  . Tiotropium Bromide-Olodaterol (STIOLTO RESPIMAT) 2.5-2.5 MCG/ACT AERS Inhale 2 puffs into the lungs daily. 1 Inhaler 6   No current facility-administered medications on file prior to visit.    No Known Allergies  Past Medical History  Diagnosis Date  . Coronary atherosclerosis of unspecified type of vessel, native or graft   . Unspecified essential hypertension   . Other and unspecified hyperlipidemia   . Chronic airway obstruction, not elsewhere classified   . Arthropathy, unspecified, site unspecified   . Thyroid disease     hypothyroid  . Anginal pain   . Hypothyroidism   . Headache(784.0)   . Arthritis     OSTEO BACK KNEES HIPS & HANDS  . Unspecified sleep apnea     Dr. Jacelyn Grip at [facility] told pt he had sleep apnea, but never sent him for a sleep study so he doesn't wear a CPAP  . Shortness of breath  with exertion    Past Surgical History  Procedure Laterality Date  . Coronary stent placement      2003, 2004  . Hip arthroplasty    . Ankle surgery    . Hemorrhoid surgery    . Tonsillectomy    . Cardiac catheterization  01/15/2013  . Colonoscopy N/A 05/02/2013    Procedure: COLONOSCOPY;  Surgeon: Rogene Houston, MD;  Location: [facility];  Service: Endoscopy;  Laterality: N/A;  200-moved to 1200 Ann notified pt  . Esophagogastroduodenoscopy N/A 05/02/2013    Procedure: ESOPHAGOGASTRODUODENOSCOPY (EGD);  Surgeon: Rogene Houston, MD;  Location: [facility];  Service:  Endoscopy;  Laterality: N/A;  . Balloon dilation N/A 05/02/2013    Procedure: BALLOON DILATION;  Surgeon: Rogene Houston, MD;  Location: [facility];  Service: Endoscopy;  Laterality: N/A;  Venia Minks dilation N/A 05/02/2013    Procedure: Venia Minks DILATION;  Surgeon: Rogene Houston, MD;  Location: [facility];  Service: Endoscopy;  Laterality: N/A;  . Savory dilation N/A 05/02/2013    Procedure: SAVORY DILATION;  Surgeon: Rogene Houston, MD;  Location: [facility];  Service: Endoscopy;  Laterality: N/A;  . Left heart catheterization with coronary angiogram N/A 01/15/2013    Procedure: LEFT HEART CATHETERIZATION WITH CORONARY ANGIOGRAM;  Surgeon: Sinclair Grooms, MD;  Location: [facility] CATH LAB;  Service: Cardiovascular;  Laterality: N/A;  . Knee surgery Left 12/2013    History  Smoking status  . Former Smoker -- 3.00 packs/day for 20 years  . Types: Cigarettes  . Quit date: 05/26/1975  Smokeless tobacco  . Former Systems developer  . Types: Chew  . Quit date: 01/04/2000    History  Alcohol Use No    Family History  Problem Relation Age of Onset  . Colon cancer Neg Hx   . CAD Father     MI at age 68  . Stroke Mother     Review of Systems: The review of systems is positive for severe arthritis in his knees, particularly on the left.  Dyspnea with any exertion. Chest tightness and pain as noted above. All other systems were reviewed and are negative.  Physical Exam: BP 120/64 mmHg  Pulse 56  Ht 6' (1.829 m)  Wt 242 lb (109.77 kg)  BMI 32.81 kg/m2 He is a pleasant white male in mild  distress. HEENT: Normal. Neck: No JVD, adenopathy, thyromegaly, or bruits. Lungs: Clear Cardiovascular: Regular rate and rhythm. Normal S1 and S2. No gallop, murmur, or click. No chest wall pain to palpation. Abdomen: Soft and nontender. No masses or bruits. Bowel sounds are positive. Extremities: No cyanosis or edema. Pulses are 2+ and symmetric. Skin: Warm and dry Neuro: Alert and oriented x3.  Cranial nerves II through XII are intact.  LABORATORY DATA: Lab Results  Component Value Date   WBC 6.8 05/13/2014   HGB 16.6 05/13/2014   HCT 48.0 05/13/2014   PLT 253.0 05/13/2014   GLUCOSE 107* 04/04/2013   CHOL 142 07/30/2013   TRIG 117 07/30/2013   HDL 47 07/30/2013   LDLDIRECT 70.0 04/12/2006   LDLCALC 72 07/30/2013   ALT 12 07/30/2013   AST 14 07/30/2013   NA 140 04/04/2013   K 4.3 04/04/2013   CL 100 04/04/2013   CREATININE 1.07 04/04/2013   BUN 23 04/04/2013   CO2 22 04/04/2013   TSH 0.909 07/30/2013   INR 0.98 01/15/2013   HGBA1C  08/06/2008    5.8 (NOTE) The ADA recommends  the following therapeutic goal for glycemic control related to Hgb A1c measurement: Goal of therapy: <6.5 Hgb A1c  Reference: American Diabetes Association: Clinical Practice Recommendations 2010, Diabetes Care, 2010, 33: (Suppl  1).   Ecg today shows NSR with LAFB. No acute ST-T changes. I have personally reviewed and interpreted this study.   Assessment / Plan: 1. Unstable angina. Accelerating symptoms of 7-10 days. Now with resting angina refractory to sl NTG. Coronary disease status post stenting of the third obtuse marginal vessel and 2003 with a bare-metal stent. Status post stenting of the proximal left circumflex in 2007 with a Taxus DES.  Patient to be transported to the hospital by EMS for admission. Will start IV Ntg and heparin. Continue ASA, beta blocker, statin. Will cycle cardiac enzymes and Ecg. Plan LHC with possible PCI in am. The procedure and risks were reviewed including but not limited to death, myocardial infarction, stroke, arrythmias, bleeding, transfusion, emergency surgery, dye allergy, or renal dysfunction. The patient voices understanding and is agreeable to proceed..   2. Hyperlipidemia. Continue statin therapy. Will check fasting lipid panel. Check LFTs.   3. Hypertension-controlled. Continue lisinopril and atenolol.  4. COPD. Followed by Dr. Lake Bells. Scheduled for  PFTs but I think his recent symptoms are most likely cardiac.   5. Hypothyroidism. On replacement. Check TSH.

## 2014-05-15 NOTE — Progress Notes (Addendum)
ANTICOAGULATION CONSULT NOTE - Initial Consult  Pharmacy Consult for Heparin Indication: chest pain/ACS  No Known Allergies  Patient Measurements: Height: 6' (182.9 cm) Weight: 242 lb (109.77 kg) IBW/kg (Calculated) : 77.6 Heparin Dosing Weight: 103 kg  Vital Signs: BP: 110/73 mmHg (05/12 1314) Pulse Rate: 54 (05/12 1314)  Labs:  Recent Labs  05/13/14 0951  HGB 16.6  HCT 48.0  PLT 253.0    CrCl cannot be calculated (Patient has no serum creatinine result on file.).   Medical History: Past Medical History  Diagnosis Date  . Coronary atherosclerosis of unspecified type of vessel, native or graft   . Unspecified essential hypertension   . Other and unspecified hyperlipidemia   . Chronic airway obstruction, not elsewhere classified   . Arthropathy, unspecified, site unspecified   . Thyroid disease     hypothyroid  . Anginal pain   . Hypothyroidism   . Headache(784.0)   . Arthritis     OSTEO BACK KNEES HIPS & HANDS  . Unspecified sleep apnea     [facility] Jacelyn Grip at [facility] told pt he had sleep apnea, but never sent him for a sleep study so he doesn't wear a CPAP  . Shortness of breath     with exertion    Assessment: 69 year old male with a history of coronary disease to begin heparin with plans for cath tomorrow 5/13  Goal of Therapy:  Heparin level 0.3-0.7 units/ml Monitor platelets by anticoagulation protocol: Yes   Plan:  Heparin 4000 units iv bolus x 1 Heparin drip at 1550 units / hr Heparin level 6 hours after heparin starts Daily heparin level, CBC  Thank you. Anette Guarneri, PharmD [phone]  05/15/2014,1:33 PM  Addum:  Initial heparin level is therapeutic.  Cont heparin at 1550 units hr and f/u am labs. Excell Seltzer, PharmD

## 2014-05-15 NOTE — H&P (Signed)
Dr. Doug Sou office note is this patient's H/P.     [name] Date of Birth: 03-May-1945 Medical Record #[id number]  History of Present Illness: [name] is seen as a work in today for evaluation of chest pain. He has a history of coronary disease. His initial cardiac event was in 2001. He had stenting of the third obtuse marginal vessel with a bare-metal stent. He had repeat cardiac catheterization in 2003 including intravascular ultrasound with no obstructive disease. In 2007 he presented with recurrent angina and had stenting of the proximal left circumflex with a Taxus stent. In 2010 he had another cardiac catheterization. This demonstrated moderate disease in the RCA and 70%. His stents were still patent. He was treated medically. He had a Lexiscan myoview study in Nov. 2014 which showed a small lateral fixed defect consistent with scar or artifact. No ischemia. EF 59%.  He presents now with a 7-10 day history of worsening dyspnea and chest pain. He first noted worsening dyspnea with any activity and feeling tired. He was seen by pulmonary and Dr. Lake Bells felt his symptoms were more cardiac. CXR was unremarkable. He reports 3 episodes of mid sternal chest pain radiating into jaw. At times it was severe. Did not take Ntg because his tablets were old and turned to powder. When seen in office today having 4/10 chest pain unrelieved with SL Ntg x 2.   Current Outpatient Prescriptions on File Prior to Visit  Medication Sig Dispense Refill  . albuterol (PROAIR HFA) 108 (90 BASE) MCG/ACT inhaler Inhale 2 puffs into the lungs every 6 (six) hours as needed for wheezing or shortness of breath. 1 Inhaler 2  . aspirin 325 MG tablet Take 325 mg by mouth daily.     Marland Kitchen atenolol (TENORMIN) 50 MG tablet TAKE ONE TABLET BY MOUTH ONE TIME DAILY 90 tablet 0  . Hydrocodone-Acetaminophen 7.5-300 MG TABS Take 1 tablet by mouth every 4 (four) hours as needed. 4 severe pain in right  arm. 120 each 0  . levothyroxine (SYNTHROID, LEVOTHROID) 175 MCG tablet TAKE 1 TABLET (175 MCG TOTAL) BY MOUTH DAILY. 30 tablet 2  . lisinopril-hydrochlorothiazide (PRINZIDE,ZESTORETIC) 20-25 MG per tablet TAKE ONE TABLET BY MOUTH ONE TIME DAILY 90 tablet 0  . nitroGLYCERIN (NITROSTAT) 0.4 MG SL tablet Place 0.4 mg under the tongue every 5 (five) minutes as needed for chest pain.     . pantoprazole (PROTONIX) 40 MG tablet TAKE 1 TABLET (40 MG TOTAL) BY MOUTH DAILY. 90 tablet 2  . simvastatin (ZOCOR) 40 MG tablet TAKE ONE TABLET BY MOUTH AT BEDTIME 30 tablet 5  . Tiotropium Bromide-Olodaterol (STIOLTO RESPIMAT) 2.5-2.5 MCG/ACT AERS Inhale 2 puffs into the lungs daily. 1 Inhaler 6   No current facility-administered medications on file prior to visit.    No Known Allergies  Past Medical History  Diagnosis Date  . Coronary atherosclerosis of unspecified type of vessel, native or graft   . Unspecified essential hypertension   . Other and unspecified hyperlipidemia   . Chronic airway obstruction, not elsewhere classified   . Arthropathy, unspecified, site unspecified   . Thyroid disease     hypothyroid  . Anginal pain   . Hypothyroidism   . Headache(784.0)   . Arthritis     OSTEO BACK KNEES HIPS & HANDS  . Unspecified sleep apnea     Dr. Jacelyn Grip at [facility] told pt he had sleep apnea, but never sent him for a sleep study so he doesn't wear a CPAP  .  Shortness of breath     with exertion    Past Surgical History  Procedure Laterality Date  . Coronary stent placement      2003, 2004  . Hip arthroplasty    . Ankle surgery    . Hemorrhoid surgery    . Tonsillectomy    . Cardiac catheterization  01/15/2013  . Colonoscopy N/A 05/02/2013    Procedure: COLONOSCOPY; Surgeon: Rogene Houston, MD; [facility]; Service: Endoscopy; Laterality:  N/A; 200-moved to 1200 Ann notified pt  . Esophagogastroduodenoscopy N/A 05/02/2013    Procedure: ESOPHAGOGASTRODUODENOSCOPY (EGD); Surgeon: Rogene Houston, MD; [facility]; Service: Endoscopy; Laterality: N/A;  . Balloon dilation N/A 05/02/2013    Procedure: BALLOON DILATION; Surgeon: Rogene Houston, MD; [facility]; Service: Endoscopy; Laterality: N/A;  Venia Minks dilation N/A 05/02/2013    Procedure: Venia Minks DILATION; Surgeon: Rogene Houston, MD; [facility]; Service: Endoscopy; Laterality: N/A;  . Savory dilation N/A 05/02/2013    Procedure: SAVORY DILATION; Surgeon: Rogene Houston, MD; [facility]; Service: Endoscopy; Laterality: N/A;  . Left heart catheterization with coronary angiogram N/A 01/15/2013    Procedure: LEFT HEART CATHETERIZATION WITH CORONARY ANGIOGRAM; Surgeon: Sinclair Grooms, MD; Location: [facility] CATH LAB; Service: Cardiovascular; Laterality: N/A;  . Knee surgery Left 12/2013    History  Smoking status  . Former Smoker -- 3.00 packs/day for 20 years  . Types: Cigarettes  . Quit date: 05/26/1975  Smokeless tobacco  . Former Systems developer  . Types: Chew  . Quit date: 01/04/2000    History  Alcohol Use No    Family History  Problem Relation Age of Onset  . Colon cancer Neg Hx   . CAD Father     MI at age 43  . Stroke Mother     Review of Systems: The review of systems is positive for severe arthritis in his knees, particularly on the left. Dyspnea with any exertion. Chest tightness and pain as noted above. All other systems were reviewed and are negative.  Physical Exam: BP 120/64 mmHg  Pulse 56  Ht 6' (1.829 m)  Wt 242 lb (109.77 kg)  BMI 32.81 kg/m2 He is a pleasant white male in mild distress. HEENT: Normal. Neck: No JVD, adenopathy, thyromegaly, or bruits. Lungs: Clear Cardiovascular: Regular rate and  rhythm. Normal S1 and S2. No gallop, murmur, or click. No chest wall pain to palpation. Abdomen: Soft and nontender. No masses or bruits. Bowel sounds are positive. Extremities: No cyanosis or edema. Pulses are 2+ and symmetric. Skin: Warm and dry Neuro: Alert and oriented x3. Cranial nerves II through XII are intact.  LABORATORY DATA: Lab Results  Component Value Date   WBC 6.8 05/13/2014   HGB 16.6 05/13/2014   HCT 48.0 05/13/2014   PLT 253.0 05/13/2014   GLUCOSE 107* 04/04/2013   CHOL 142 07/30/2013   TRIG 117 07/30/2013   HDL 47 07/30/2013   LDLDIRECT 70.0 04/12/2006   LDLCALC 72 07/30/2013   ALT 12 07/30/2013   AST 14 07/30/2013   NA 140 04/04/2013   K 4.3 04/04/2013   CL 100 04/04/2013   CREATININE 1.07 04/04/2013   BUN 23 04/04/2013   CO2 22 04/04/2013   TSH 0.909 07/30/2013   INR 0.98 01/15/2013   HGBA1C  08/06/2008    5.8 (NOTE) The ADA recommends the following therapeutic goal for glycemic control related to Hgb A1c measurement: Goal of therapy: <6.5 Hgb A1c Reference: American  Diabetes Association: Clinical Practice Recommendations 2010, Diabetes Care, 2010, 33: (Suppl 1).   Ecg today shows NSR with LAFB. No acute ST-T changes. I have personally reviewed and interpreted this study.   Assessment / Plan: 1. Unstable angina. Accelerating symptoms of 7-10 days. Now with resting angina refractory to sl NTG. Coronary disease status post stenting of the third obtuse marginal vessel and 2003 with a bare-metal stent. Status post stenting of the proximal left circumflex in 2007 with a Taxus DES.  Patient to be transported to the hospital by EMS for admission. Will start IV Ntg and heparin. Continue ASA, beta blocker, statin. Will cycle cardiac enzymes and Ecg. Plan LHC with possible PCI in am. The procedure and risks were reviewed including but not limited to death, myocardial  infarction, stroke, arrythmias, bleeding, transfusion, emergency surgery, dye allergy, or renal dysfunction. The patient voices understanding and is agreeable to proceed..   2. Hyperlipidemia. Continue statin therapy. Will check fasting lipid panel. Check LFTs.   3. Hypertension-controlled. Continue lisinopril and atenolol.  4. COPD. Followed by Dr. Lake Bells. Scheduled for PFTs but I think his recent symptoms are most likely cardiac.   5. Hypothyroidism. On replacement. Check TSH.            Patient seen and examined and history reviewed. Agree with above findings and plan.  Hideko Esselman [place], [place] 05/15/2014 9:58 PM

## 2014-05-15 NOTE — ED Provider Notes (Signed)
EKG: normal EKG, normal sinus rhythm, sinus bradycardia. Similar to prior   Evelina Bucy, MD 05/15/14 1529

## 2014-05-15 NOTE — ED Notes (Signed)
Sent from cardiologist (Dr. Martinique) office for CP. Ongoing for a week and a half, gets better and worse. Occurs at rest and with activity. At cards office was 9/10... Given 2 nitro and 324 of asa, improved to 3/10. Arrives with central chest pain rated 3/10.

## 2014-05-15 NOTE — Telephone Encounter (Signed)
Received call back from patient he stated he has been having chest tightness and sob for the last 4 to 5 days.Stated this past Saturday pain radiated up into jaw.Stated he saw [id number] 05/12/14 and he advised him to see Dr.Jordan.Appointment scheduled with Dr.Jordan today at 11:45 am.

## 2014-05-15 NOTE — ED Provider Notes (Signed)
CSN: [id number]     Arrival date & time 05/15/14  1306 History   First MD Initiated Contact with Patient 05/15/14 1307     Chief Complaint  Patient presents with  . Chest Pain     (Consider location/radiation/quality/duration/timing/severity/associated sxs/prior Treatment) HPI Comments: Patient with a history of CAD s/p stents presents today with chest pain.  He was brought into the ED via EMS from the office of his Cardiologist Dr. Martinique.  He was given 324 mg ASA and SL NG x 2 en route to the ED, which he reports helped with his chest pain.  However, he is still having the pain.  He rates his current pain as 3/10.  Pain located left anterior chest.  He reports that the pain has been intermittent over the past 1.5 weeks.  He states that a couple of days ago the pain radiated to his jaw, but pain does not radiate at this time.  Pain comes both with exertion and at rest.  He reports associated SOB, but denies nausea, vomiting, cough, fever, chills, diaphoresis, dizziness, or syncope.    The history is provided by the patient.    Past Medical History  Diagnosis Date  . Coronary atherosclerosis of unspecified type of vessel, native or graft   . Unspecified essential hypertension   . Other and unspecified hyperlipidemia   . Chronic airway obstruction, not elsewhere classified   . Arthropathy, unspecified, site unspecified   . Thyroid disease     hypothyroid  . Anginal pain   . Hypothyroidism   . Headache(784.0)   . Arthritis     OSTEO BACK KNEES HIPS & HANDS  . Unspecified sleep apnea     Dr. Jacelyn Grip at [facility] told pt he had sleep apnea, but never sent him for a sleep study so he doesn't wear a CPAP  . Shortness of breath     with exertion   Past Surgical History  Procedure Laterality Date  . Coronary stent placement      2003, 2004  . Hip arthroplasty    . Ankle surgery    . Hemorrhoid surgery    . Tonsillectomy    . Cardiac catheterization  01/15/2013  . Colonoscopy  N/A 05/02/2013    Procedure: COLONOSCOPY;  Surgeon: Rogene Houston, MD;  [facility];  Service: Endoscopy;  Laterality: N/A;  200-moved to 1200 Ann notified pt  . Esophagogastroduodenoscopy N/A 05/02/2013    Procedure: ESOPHAGOGASTRODUODENOSCOPY (EGD);  Surgeon: Rogene Houston, MD;  [facility];  Service: Endoscopy;  Laterality: N/A;  . Balloon dilation N/A 05/02/2013    Procedure: BALLOON DILATION;  Surgeon: Rogene Houston, MD;  [facility];  Service: Endoscopy;  Laterality: N/A;  Venia Minks dilation N/A 05/02/2013    Procedure: Venia Minks DILATION;  Surgeon: Rogene Houston, MD;  [facility];  Service: Endoscopy;  Laterality: N/A;  . Savory dilation N/A 05/02/2013    Procedure: SAVORY DILATION;  Surgeon: Rogene Houston, MD;  [facility];  Service: Endoscopy;  Laterality: N/A;  . Left heart catheterization with coronary angiogram N/A 01/15/2013    Procedure: LEFT HEART CATHETERIZATION WITH CORONARY ANGIOGRAM;  Surgeon: Sinclair Grooms, MD;  Location: [facility] CATH LAB;  Service: Cardiovascular;  Laterality: N/A;  . Knee surgery Left 12/2013   Family History  Problem Relation Age of Onset  . Colon cancer Neg Hx   . CAD Father     MI at age  43  . Stroke Mother    History  Substance Use Topics  . Smoking status: Former Smoker -- 3.00 packs/day for 20 years    Types: Cigarettes    Quit date: 05/26/1975  . Smokeless tobacco: Former Systems developer    Types: Chew    Quit date: 01/04/2000  . Alcohol Use: No    Review of Systems  All other systems reviewed and are negative.     Allergies  Review of patient's allergies indicates no known allergies.  Home Medications   Prior to Admission medications   Medication Sig Start Date End Date Taking? Authorizing Provider  albuterol (PROAIR HFA) 108 (90 BASE) MCG/ACT inhaler Inhale 2 puffs into the lungs every 6 (six) hours as needed for wheezing or shortness of breath. 12/06/12   Juanito Doom,  MD  aspirin 325 MG tablet Take 325 mg by mouth daily.      Historical Provider, MD  atenolol (TENORMIN) 50 MG tablet TAKE ONE TABLET BY MOUTH ONE TIME DAILY 05/09/14   Wardell Honour, MD  Hydrocodone-Acetaminophen 7.5-300 MG TABS Take 1 tablet by mouth every 4 (four) hours as needed. 4 severe pain in right arm. 04/15/14   Claretta Fraise, MD  levothyroxine (SYNTHROID, LEVOTHROID) 175 MCG tablet TAKE 1 TABLET (175 MCG TOTAL) BY MOUTH DAILY. 04/16/14   Wardell Honour, MD  lisinopril-hydrochlorothiazide (PRINZIDE,ZESTORETIC) 20-25 MG per tablet TAKE ONE TABLET BY MOUTH ONE TIME DAILY 04/29/14   Wardell Honour, MD  nitroGLYCERIN (NITROSTAT) 0.4 MG SL tablet Place 1 tablet (0.4 mg total) under the tongue every 5 (five) minutes as needed for chest pain. 05/15/14   Peter M Martinique, MD  pantoprazole (PROTONIX) 40 MG tablet TAKE 1 TABLET (40 MG TOTAL) BY MOUTH DAILY. 04/08/14   Butch Penny, NP  simvastatin (ZOCOR) 40 MG tablet TAKE ONE TABLET BY MOUTH AT BEDTIME 02/11/14   Wardell Honour, MD  Tiotropium Bromide-Olodaterol (STIOLTO RESPIMAT) 2.5-2.5 MCG/ACT AERS Inhale 2 puffs into the lungs daily. 11/12/13   Juanito Doom, MD   BP 110/73 mmHg  Pulse 54  Resp 14  Ht 6' (1.829 m)  Wt 242 lb (109.77 kg)  BMI 32.81 kg/m2  SpO2 95% Physical Exam  Constitutional: He appears well-developed and well-nourished.  HENT:  Head: Normocephalic and atraumatic.  Neck: Normal range of motion. Neck supple.  Cardiovascular: Normal rate, regular rhythm and normal heart sounds.   Pulses:      Dorsalis pedis pulses are 2+ on the right side, and 2+ on the left side.  Pulmonary/Chest: Effort normal and breath sounds normal. No respiratory distress. He has no wheezes. He has no rales.  Abdominal: Soft. He exhibits no distension and no mass. There is no tenderness. There is no rebound and no guarding.  Musculoskeletal: Normal range of motion.  No LE edema bilaterally  Neurological: He is alert.  Skin: Skin is warm and  dry. He is not diaphoretic.  Psychiatric: He has a normal mood and affect.  Nursing note and vitals reviewed.   ED Course  Procedures (including critical care time) Labs Review Labs Reviewed  CBC WITH DIFFERENTIAL/PLATELET  BASIC METABOLIC PANEL  I-STAT TROPOININ, ED    Imaging Review Dg Chest Portable 1 View  05/15/2014   CLINICAL DATA:  Mid chest pain for 1 day  EXAM: PORTABLE CHEST - 1 VIEW  COMPARISON:  05/13/2014  FINDINGS: The heart size and mediastinal contours are within normal limits. Both lungs are clear. The visualized skeletal structures are  unremarkable.  IMPRESSION: No active disease.   Electronically Signed   By: Inez Catalina M.D.   On: 05/15/2014 13:30     EKG Interpretation None       1:23 PM Discussed with Cardiology.  They report that they will be by to see the patient in the ED and admit. CRITICAL CARE Performed by: Hyman Bible   Total critical care time: 30  Critical care time was exclusive of separately billable procedures and treating other patients.  Critical care was necessary to treat or prevent imminent or life-threatening deterioration.  Critical care was time spent personally by me on the following activities: development of treatment plan with patient and/or surrogate as well as nursing, discussions with consultants, evaluation of patient's response to treatment, examination of patient, obtaining history from patient or surrogate, ordering and performing treatments and interventions, ordering and review of laboratory studies, ordering and review of radiographic studies, pulse oximetry and re-evaluation of patient's condition.  MDM   Final diagnoses:  None   Patient with a history of CAD s/p stents presents to the ED today with chest pain.  He was seen by his Cardiologist Dr. Martinique in the office and sent to the ED from the office via EMS.  Patient given SL NG and ASA prior to arrival in the ED.  Initial troponin negative.  No ischemic  changes on EKG.  CXR is negative.  History concerning for Unstable Angina.  Cardiology consulted and will admit the patient.  Patient started on IV Heparin and IV Nitroglycerin per Cardiology recommendations.      Hyman Bible, PA-C 05/15/14 1632  Evelina Bucy, MD 05/15/14 [phone]

## 2014-05-16 ENCOUNTER — Ambulatory Visit (HOSPITAL_COMMUNITY): Admission: RE | Admit: 2014-05-16 | Payer: Medicare Other | Source: Ambulatory Visit | Admitting: Cardiology

## 2014-05-16 ENCOUNTER — Observation Stay (HOSPITAL_COMMUNITY): Payer: Medicare Other

## 2014-05-16 ENCOUNTER — Telehealth: Payer: Self-pay | Admitting: *Deleted

## 2014-05-16 ENCOUNTER — Other Ambulatory Visit: Payer: Self-pay

## 2014-05-16 ENCOUNTER — Encounter (HOSPITAL_COMMUNITY): Payer: Self-pay | Admitting: Cardiology

## 2014-05-16 ENCOUNTER — Encounter (HOSPITAL_COMMUNITY)
Admission: EM | Disposition: A | Discharge status: Home / Self Care | Payer: Medicare Other | Source: Home / Self Care | Attending: Emergency Medicine

## 2014-05-16 DIAGNOSIS — J449 Chronic obstructive pulmonary disease, unspecified: Secondary | ICD-10-CM | POA: Diagnosis not present

## 2014-05-16 DIAGNOSIS — E876 Hypokalemia: Secondary | ICD-10-CM | POA: Diagnosis not present

## 2014-05-16 DIAGNOSIS — I25110 Atherosclerotic heart disease of native coronary artery with unstable angina pectoris: Secondary | ICD-10-CM | POA: Diagnosis not present

## 2014-05-16 DIAGNOSIS — E039 Hypothyroidism, unspecified: Secondary | ICD-10-CM

## 2014-05-16 DIAGNOSIS — I10 Essential (primary) hypertension: Secondary | ICD-10-CM | POA: Diagnosis not present

## 2014-05-16 DIAGNOSIS — E785 Hyperlipidemia, unspecified: Secondary | ICD-10-CM | POA: Diagnosis not present

## 2014-05-16 HISTORY — PX: CARDIAC CATHETERIZATION: SHX172

## 2014-05-16 LAB — CBC
HEMATOCRIT: 41.9 % (ref 39.0–52.0)
HEMOGLOBIN: 14.3 g/dL (ref 13.0–17.0)
MCH: 30.4 pg (ref 26.0–34.0)
MCHC: 34.1 g/dL (ref 30.0–36.0)
MCV: 89.1 fL (ref 78.0–100.0)
Platelets: 191 10*3/uL (ref 150–400)
RBC: 4.7 MIL/uL (ref 4.22–5.81)
RDW: 13 % (ref 11.5–15.5)
WBC: 6.5 10*3/uL (ref 4.0–10.5)

## 2014-05-16 LAB — BASIC METABOLIC PANEL
Anion gap: 8 (ref 5–15)
BUN: 13 mg/dL (ref 6–20)
CALCIUM: 8.6 mg/dL — AB (ref 8.9–10.3)
CO2: 23 mmol/L (ref 22–32)
Chloride: 106 mmol/L (ref 101–111)
Creatinine, Ser: 1.09 mg/dL (ref 0.61–1.24)
GFR calc non Af Amer: >60 mL/min (ref >60)
Glucose, Bld: 107 mg/dL — ABNORMAL HIGH (ref 65–99)
Potassium: 3.4 mmol/L — ABNORMAL LOW (ref 3.5–5.1)
Sodium: 137 mmol/L (ref 135–145)

## 2014-05-16 LAB — LIPID PANEL
Cholesterol: 128 mg/dL (ref 0–200)
HDL: 42 mg/dL (ref >40)
LDL CALC: 70 mg/dL (ref 0–99)
TRIGLYCERIDES: 82 mg/dL (ref <150)
Total CHOL/HDL Ratio: 3 RATIO
VLDL: 16 mg/dL (ref 0–40)

## 2014-05-16 LAB — HEPARIN LEVEL (UNFRACTIONATED): HEPARIN UNFRACTIONATED: 0.66 [IU]/mL (ref 0.30–0.70)

## 2014-05-16 LAB — PROTIME-INR
INR: 1.18 (ref 0.00–1.49)
PROTHROMBIN TIME: 15.1 s (ref 11.6–15.2)

## 2014-05-16 LAB — TROPONIN I: Troponin I: <0.03 ng/mL (ref <0.031)

## 2014-05-16 SURGERY — LEFT HEART CATH AND CORONARY ANGIOGRAPHY
Anesthesia: LOCAL

## 2014-05-16 MED ORDER — SODIUM CHLORIDE 0.9 % IJ SOLN: 3.0000 mL | INTRAMUSCULAR | Status: DC | PRN

## 2014-05-16 MED ORDER — VERAPAMIL HCL 2.5 MG/ML IV SOLN
INTRAVENOUS | Status: DC | PRN
  Administered 2014-05-16: 14:00:00 via INTRA_ARTERIAL

## 2014-05-16 MED ORDER — SODIUM CHLORIDE 0.9 % IJ SOLN
3.0000 mL | Freq: Two times a day (BID) | INTRAMUSCULAR | Status: DC
  Administered 2014-05-16 – 2014-05-17 (×2): 3 mL via INTRAVENOUS

## 2014-05-16 MED ORDER — HEPARIN (PORCINE) IN NACL 2-0.9 UNIT/ML-% IJ SOLN
INTRAMUSCULAR | Status: AC
  Filled 2014-05-16: qty 1000

## 2014-05-16 MED ORDER — SIMVASTATIN 40 MG PO TABS
40.0000 mg | ORAL_TABLET | Freq: Every day | ORAL | Status: DC
  Administered 2014-05-17: 40 mg via ORAL
  Filled 2014-05-16: qty 1

## 2014-05-16 MED ORDER — IOHEXOL 350 MG/ML SOLN
INTRAVENOUS | Status: DC | PRN
  Administered 2014-05-16: 80 mL via INTRAVENOUS

## 2014-05-16 MED ORDER — LEVOTHYROXINE SODIUM 75 MCG PO TABS
150.0000 ug | ORAL_TABLET | Freq: Every day | ORAL | Status: DC
  Administered 2014-05-16 – 2014-05-17 (×2): 150 ug via ORAL
  Filled 2014-05-16: qty 2

## 2014-05-16 MED ORDER — VERAPAMIL HCL 2.5 MG/ML IV SOLN
INTRAVENOUS | Status: AC
  Filled 2014-05-16: qty 2

## 2014-05-16 MED ORDER — NITROGLYCERIN 1 MG/10 ML FOR IR/CATH LAB
INTRA_ARTERIAL | Status: AC
  Filled 2014-05-16: qty 10

## 2014-05-16 MED ORDER — LIDOCAINE HCL (PF) 1 % IJ SOLN
INTRAMUSCULAR | Status: AC
  Filled 2014-05-16: qty 30

## 2014-05-16 MED ORDER — FENTANYL CITRATE (PF) 100 MCG/2ML IJ SOLN
INTRAMUSCULAR | Status: DC | PRN
  Administered 2014-05-16: 25 ug via INTRAVENOUS

## 2014-05-16 MED ORDER — SODIUM CHLORIDE 0.9 % IV SOLN: 250.0000 mL | INTRAVENOUS | Status: DC | PRN

## 2014-05-16 MED ORDER — IOHEXOL 350 MG/ML SOLN
80.0000 mL | Freq: Once | INTRAVENOUS | Status: AC | PRN
  Administered 2014-05-16: 80 mL via INTRAVENOUS

## 2014-05-16 MED ORDER — FENTANYL CITRATE (PF) 100 MCG/2ML IJ SOLN
INTRAMUSCULAR | Status: AC
  Filled 2014-05-16: qty 2

## 2014-05-16 MED ORDER — SODIUM CHLORIDE 0.9 % IV BOLUS (SEPSIS)
500.0000 mL | Freq: Once | INTRAVENOUS | Status: AC
  Administered 2014-05-16: 500 mL via INTRAVENOUS

## 2014-05-16 MED ORDER — MIDAZOLAM HCL 2 MG/2ML IJ SOLN
INTRAMUSCULAR | Status: DC | PRN
  Administered 2014-05-16: 1 mg via INTRAVENOUS

## 2014-05-16 MED ORDER — HEPARIN SODIUM (PORCINE) 5000 UNIT/ML IJ SOLN: 5000.0000 [IU] | Freq: Three times a day (TID) | INTRAMUSCULAR | Status: DC

## 2014-05-16 MED ORDER — MIDAZOLAM HCL 2 MG/2ML IJ SOLN
INTRAMUSCULAR | Status: AC
  Filled 2014-05-16: qty 2

## 2014-05-16 MED ORDER — LISINOPRIL 20 MG PO TABS: 20.0000 mg | ORAL_TABLET | Freq: Every day | ORAL | Status: DC

## 2014-05-16 MED ORDER — POTASSIUM CHLORIDE CRYS ER 20 MEQ PO TBCR
20.0000 meq | EXTENDED_RELEASE_TABLET | Freq: Once | ORAL | Status: AC
  Administered 2014-05-16: 20 meq via ORAL
  Filled 2014-05-16: qty 1

## 2014-05-16 MED ORDER — SODIUM CHLORIDE 0.9 % WEIGHT BASED INFUSION
3.0000 mL/kg/h | INTRAVENOUS | Status: AC
  Administered 2014-05-16: 3 mL/kg/h via INTRAVENOUS

## 2014-05-16 SURGICAL SUPPLY — 15 items
CATH INFINITI 5 FR JL3.5 (CATHETERS) ×2 IMPLANT
CATH INFINITI 5FR AL1 (CATHETERS) ×1 IMPLANT
CATH INFINITI 5FR ANG PIGTAIL (CATHETERS) ×2 IMPLANT
CATH INFINITI 5FR MULTPACK ANG (CATHETERS) IMPLANT
CATH INFINITI JR4 5F (CATHETERS) ×2 IMPLANT
DEVICE RAD COMP TR BAND LRG (VASCULAR PRODUCTS) ×2 IMPLANT
GLIDESHEATH SLEND SS 6F .021 (SHEATH) ×2 IMPLANT
KIT HEART LEFT (KITS) ×2 IMPLANT
PACK CARDIAC CATHETERIZATION (CUSTOM PROCEDURE TRAY) ×2 IMPLANT
SHEATH PINNACLE 5F 10CM (SHEATH) IMPLANT
SYR MEDRAD MARK V 150ML (SYRINGE) ×2 IMPLANT
TRANSDUCER W/STOPCOCK (MISCELLANEOUS) ×2 IMPLANT
TUBING CIL FLEX 10 FLL-RA (TUBING) ×2 IMPLANT
WIRE EMERALD 3MM-J .035X150CM (WIRE) IMPLANT
WIRE SAFE-T 1.5MM-J .035X260CM (WIRE) ×2 IMPLANT

## 2014-05-16 NOTE — H&P (View-Only) (Signed)
Patient: [name] / Admit Date: 05/15/2014 / Date of Encounter: 05/16/2014, 8:03 AM   Subjective: Had CP overnight up until 1 AM but it has steadily decreased. He feels fine at present time. Not SOB but feels "stuffy." BP soft overnight. Denies bleeding, h/o TIA/CVA or claudication.   Objective: Telemetry:NSR Physical Exam: Blood pressure 116/66, pulse 67, temperature 98 F (36.7 C), temperature source Oral, resp. rate 18, height 6' (1.829 m), weight 242 lb (109.77 kg), SpO2 92 %. General: Well developed, well nourished WM, in no acute distress. Head: Normocephalic, atraumatic, sclera non-icteric, no xanthomas, nares are without discharge. Neck: Negative for carotid bruits. JVP not elevated. Lungs: Clear bilaterally to auscultation without wheezes, rales, or rhonchi. Breathing is unlabored. Heart: RRR S1 S2 without murmurs, rubs, or gallops.  Abdomen: Soft, non-tender, non-distended with normoactive bowel sounds. No rebound/guarding. Extremities: No clubbing or cyanosis. No edema. Distal pedal pulses are 2+ on the R and 1+ on the left. Neuro: Alert and oriented X 3. Moves all extremities spontaneously. Psych:  Responds to questions appropriately with a normal affect.   Intake/Output Summary (Last 24 hours) at 05/16/14 0803 Last data filed at 05/16/14 0751  Gross per 24 hour  Intake      0 ml  Output    800 ml  Net   -800 ml    Inpatient Medications:  . aspirin  324 mg Oral NOW   Or  . aspirin  300 mg Rectal NOW  . aspirin  81 mg Oral Daily  . atenolol  50 mg Oral Daily  . levothyroxine  175 mcg Oral QAC breakfast  . lisinopril  20 mg Oral Daily  . pantoprazole  40 mg Oral Daily  . potassium chloride  20 mEq Oral Once  . simvastatin  40 mg Oral QHS  . sodium chloride  3 mL Intravenous Q12H  . Tiotropium Bromide-Olodaterol  2 puff Inhalation Daily   Infusions:  . sodium chloride 1 mL/kg/hr (05/16/14 0708)  . heparin 1,550 Units/hr (05/15/14 1352)  . nitroGLYCERIN  Stopped (05/16/14 0015)    Labs:  Recent Labs  05/15/14 1332 05/16/14 0115  NA 139 137  K 3.9 3.4*  CL 105 106  CO2 25 23  GLUCOSE 103* 107*  BUN 17 13  CREATININE 1.16 1.09  CALCIUM 9.1 8.6*    Recent Labs  05/15/14 2010  AST 20  ALT 12*  ALKPHOS 43  BILITOT 1.1  PROT 6.4*  ALBUMIN 3.6    Recent Labs  05/13/14 0951 05/15/14 1332 05/16/14 0115  WBC 6.8 5.1 6.5  NEUTROABS 4.0 2.3  --   HGB 16.6 15.0 14.3  HCT 48.0 44.7 41.9  MCV 90.0 90.3 89.1  PLT 253.0 200 191    Recent Labs  05/15/14 0054 05/15/14 2010 05/16/14 0650  TROPONINI <0.03 <0.03 <0.03   EKG NSR 66bpm - TWI III, V2-V4   Radiology/Studies:  Dg Chest 2 View  05/13/2014   CLINICAL DATA:  Chest tightness, shortness of breath for 2-3 weeks, former smoking history  EXAM: CHEST  2 VIEW  COMPARISON:  Chest x-ray 01/15/2013  FINDINGS: Mild linear atelectasis or scarring is noted at the lung bases. No focal infiltrate or effusion is seen. Mediastinal and hilar contours are unremarkable. The heart is within normal limits in size. No bony abnormality is seen.  IMPRESSION: No active cardiopulmonary disease.   Electronically Signed   By: Ivar Drape M.D.   On: 05/13/2014 10:56   Dg Chest Portable 1 View  05/15/2014   CLINICAL DATA:  Mid chest pain for 1 day  EXAM: PORTABLE CHEST - 1 VIEW  COMPARISON:  05/13/2014  FINDINGS: The heart size and mediastinal contours are within normal limits. Both lungs are clear. The visualized skeletal structures are unremarkable.  IMPRESSION: No active disease.   Electronically Signed   By: Inez Catalina M.D.   On: 05/15/2014 13:30     Assessment and Plan   1. CAD/unstable angina - continue aspirin, BB (as BP allows), statin, and heparin. NTG drip paused due to hypotension - remains off at this time since he is chest pain free. Plan cath today at 1330. Risks/benefits already discussed. The patient denies any questions.  2. Hypertension, with hypotension overnight - hold  lisinopril this AM and restart tomorrow. HCTZ on hold for cath. Continue BB as BP allows with parameters in place.   3. Hyperlipidemia - LDL controlled on present regimen.  Can consider titration to higher potency statin, i.e. Lipitor/Crestor.  4. Hypothyroidism - TSH 0.756, free T4 mildly elevated. Will drop levothyroxine to 142mcg daily and recommend follow-up thyroid function in 4-6 weeks with PCP.  5. COPD - f/u Dr. Lake Bells.  6. Hypokalemia - receiving repletion. May need daily K when HCTZ is resumed.  7. Hyperglycemia - f/u PCP for monitoring.  SignedMelina Copa PA-C Pager: [phone]   Attending Note:   The patient was seen and examined.  Agree with assessment and plan as noted above.  Changes made to the above note as needed.   Troponin is negative.   CP free at this time .  For cath this afternoon.     Thayer Headings, Brooke Bonito., MD, [facility] 05/16/2014, 9:08 AM [address],  [place] Pager [phone]

## 2014-05-16 NOTE — Progress Notes (Signed)
Patient: [name] / Admit Date: 05/15/2014 / Date of Encounter: 05/16/2014, 8:03 AM   Subjective: Had CP overnight up until 1 AM but it has steadily decreased. He feels fine at present time. Not SOB but feels "stuffy." BP soft overnight. Denies bleeding, h/o TIA/CVA or claudication.   Objective: Telemetry:NSR Physical Exam: Blood pressure 116/66, pulse 67, temperature 98 F (36.7 C), temperature source Oral, resp. rate 18, height 6' (1.829 m), weight 242 lb (109.77 kg), SpO2 92 %. General: Well developed, well nourished WM, in no acute distress. Head: Normocephalic, atraumatic, sclera non-icteric, no xanthomas, nares are without discharge. Neck: Negative for carotid bruits. JVP not elevated. Lungs: Clear bilaterally to auscultation without wheezes, rales, or rhonchi. Breathing is unlabored. Heart: RRR S1 S2 without murmurs, rubs, or gallops.  Abdomen: Soft, non-tender, non-distended with normoactive bowel sounds. No rebound/guarding. Extremities: No clubbing or cyanosis. No edema. Distal pedal pulses are 2+ on the R and 1+ on the left. Neuro: Alert and oriented X 3. Moves all extremities spontaneously. Psych:  Responds to questions appropriately with a normal affect.   Intake/Output Summary (Last 24 hours) at 05/16/14 0803 Last data filed at 05/16/14 0751  Gross per 24 hour  Intake      0 ml  Output    800 ml  Net   -800 ml    Inpatient Medications:  . aspirin  324 mg Oral NOW   Or  . aspirin  300 mg Rectal NOW  . aspirin  81 mg Oral Daily  . atenolol  50 mg Oral Daily  . levothyroxine  175 mcg Oral QAC breakfast  . lisinopril  20 mg Oral Daily  . pantoprazole  40 mg Oral Daily  . potassium chloride  20 mEq Oral Once  . simvastatin  40 mg Oral QHS  . sodium chloride  3 mL Intravenous Q12H  . Tiotropium Bromide-Olodaterol  2 puff Inhalation Daily   Infusions:  . sodium chloride 1 mL/kg/hr (05/16/14 0708)  . heparin 1,550 Units/hr (05/15/14 1352)  . nitroGLYCERIN  Stopped (05/16/14 0015)    Labs:  Recent Labs  05/15/14 1332 05/16/14 0115  NA 139 137  K 3.9 3.4*  CL 105 106  CO2 25 23  GLUCOSE 103* 107*  BUN 17 13  CREATININE 1.16 1.09  CALCIUM 9.1 8.6*    Recent Labs  05/15/14 2010  AST 20  ALT 12*  ALKPHOS 43  BILITOT 1.1  PROT 6.4*  ALBUMIN 3.6    Recent Labs  05/13/14 0951 05/15/14 1332 05/16/14 0115  WBC 6.8 5.1 6.5  NEUTROABS 4.0 2.3  --   HGB 16.6 15.0 14.3  HCT 48.0 44.7 41.9  MCV 90.0 90.3 89.1  PLT 253.0 200 191    Recent Labs  05/15/14 0054 05/15/14 2010 05/16/14 0650  TROPONINI <0.03 <0.03 <0.03   EKG NSR 66bpm - TWI III, V2-V4   Radiology/Studies:  Dg Chest 2 View  05/13/2014   CLINICAL DATA:  Chest tightness, shortness of breath for 2-3 weeks, former smoking history  EXAM: CHEST  2 VIEW  COMPARISON:  Chest x-ray 01/15/2013  FINDINGS: Mild linear atelectasis or scarring is noted at the lung bases. No focal infiltrate or effusion is seen. Mediastinal and hilar contours are unremarkable. The heart is within normal limits in size. No bony abnormality is seen.  IMPRESSION: No active cardiopulmonary disease.   Electronically Signed   By: Ivar Drape M.D.   On: 05/13/2014 10:56   Dg Chest Portable 1 View  05/15/2014   CLINICAL DATA:  Mid chest pain for 1 day  EXAM: PORTABLE CHEST - 1 VIEW  COMPARISON:  05/13/2014  FINDINGS: The heart size and mediastinal contours are within normal limits. Both lungs are clear. The visualized skeletal structures are unremarkable.  IMPRESSION: No active disease.   Electronically Signed   By: Inez Catalina M.D.   On: 05/15/2014 13:30     Assessment and Plan   1. CAD/unstable angina - continue aspirin, BB (as BP allows), statin, and heparin. NTG drip paused due to hypotension - remains off at this time since he is chest pain free. Plan cath today at 1330. Risks/benefits already discussed. The patient denies any questions.  2. Hypertension, with hypotension overnight - hold  lisinopril this AM and restart tomorrow. HCTZ on hold for cath. Continue BB as BP allows with parameters in place.   3. Hyperlipidemia - LDL controlled on present regimen.  Can consider titration to higher potency statin, i.e. Lipitor/Crestor.  4. Hypothyroidism - TSH 0.756, free T4 mildly elevated. Will drop levothyroxine to 144mcg daily and recommend follow-up thyroid function in 4-6 weeks with PCP.  5. COPD - f/u Dr. Lake Bells.  6. Hypokalemia - receiving repletion. May need daily K when HCTZ is resumed.  7. Hyperglycemia - f/u PCP for monitoring.  SignedMelina Copa PA-C Pager: [phone]   Attending Note:   The patient was seen and examined.  Agree with assessment and plan as noted above.  Changes made to the above note as needed.   Troponin is negative.   CP free at this time .  For cath this afternoon.     Thayer Headings, Brooke Bonito., MD, [facility] 05/16/2014, 9:08 AM [address],  [place] Pager [phone]

## 2014-05-16 NOTE — Progress Notes (Signed)
Pt continues to have SOB with exertion,  Resting in bed at present respirations within normal limits.

## 2014-05-16 NOTE — Progress Notes (Signed)
ANTICOAGULATION CONSULT NOTE - Follow Up Consult  Pharmacy Consult for Heparin Indication: chest pain/ACS  No Known Allergies  Patient Measurements: Height: 6' (182.9 cm) Weight: 242 lb (109.77 kg) IBW/kg (Calculated) : 77.6 Heparin Dosing Weight: 103kg  Vital Signs: Temp: 98 F (36.7 C) (05/13 0750) Temp Source: Oral (05/13 0750) BP: 116/66 mmHg (05/13 0750) Pulse Rate: 67 (05/13 0750)  Labs:  Recent Labs  05/15/14 0054 05/15/14 1332 05/15/14 2010 05/16/14 0115 05/16/14 0650  HGB  --  15.0  --  14.3  --   HCT  --  44.7  --  41.9  --   PLT  --  200  --  191  --   LABPROT  --   --   --  15.1  --   INR  --   --   --  1.18  --   HEPARINUNFRC  --   --  0.51 0.66  --   CREATININE  --  1.16  --  1.09  --   TROPONINI <0.03  --  <0.03  --  <0.03    Estimated Creatinine Clearance: 81.9 mL/min (by C-G formula based on Cr of 1.09).  Assessment: 68yom continues on heparin for unstable angina with plans for cath today. Heparin level is at goal. CBC stable. No bleeding reported.  Goal of Therapy:  Heparin level 0.3-0.7 units/ml Monitor platelets by anticoagulation protocol: Yes   Plan:  1) Continue heparin at 1550 units/hr 2) Follow up after cath  Deboraha Sprang 05/16/2014,9:57 AM

## 2014-05-16 NOTE — Interval H&P Note (Signed)
History and Physical Interval Note:  05/16/2014 1:04 PM  [name]  has presented today for surgery, with the diagnosis of chest pain  The various methods of treatment have been discussed with the patient and family. After consideration of risks, benefits and other options for treatment, the patient has consented to  Procedure(s): Left Heart Cath and Coronary Angiography (N/A) as a surgical intervention .  The patient's history has been reviewed, patient examined, no change in status, stable for surgery.  I have reviewed the patient's chart and labs.  Questions were answered to the patient's satisfaction.    Cath Lab Visit (complete for each Cath Lab visit)  Clinical Evaluation Leading to the Procedure:   ACS: Yes.    Non-ACS:    Anginal Classification: CCS IV  Anti-ischemic medical therapy: Maximal Therapy (2 or more classes of medications)  Non-Invasive Test Results: No non-invasive testing performed  Prior CABG: No previous CABG       [name] [facility] 05/16/2014 1:04 PM

## 2014-05-17 ENCOUNTER — Observation Stay (HOSPITAL_COMMUNITY): Payer: Medicare Other

## 2014-05-17 ENCOUNTER — Encounter (HOSPITAL_COMMUNITY): Payer: Self-pay | Admitting: Physician Assistant

## 2014-05-17 DIAGNOSIS — R079 Chest pain, unspecified: Secondary | ICD-10-CM | POA: Diagnosis not present

## 2014-05-17 DIAGNOSIS — I200 Unstable angina: Secondary | ICD-10-CM | POA: Diagnosis not present

## 2014-05-17 LAB — BASIC METABOLIC PANEL
Anion gap: 9 (ref 5–15)
BUN: 12 mg/dL (ref 6–20)
CALCIUM: 8.4 mg/dL — AB (ref 8.9–10.3)
CO2: 23 mmol/L (ref 22–32)
Chloride: 106 mmol/L (ref 101–111)
Creatinine, Ser: 1.11 mg/dL (ref 0.61–1.24)
GFR calc Af Amer: >60 mL/min (ref >60)
GFR calc non Af Amer: >60 mL/min (ref >60)
GLUCOSE: 106 mg/dL — AB (ref 65–99)
Potassium: 3.8 mmol/L (ref 3.5–5.1)
SODIUM: 138 mmol/L (ref 135–145)

## 2014-05-17 LAB — GLUCOSE, CAPILLARY
GLUCOSE-CAPILLARY: 107 mg/dL — AB (ref 65–99)
Glucose-Capillary: 94 mg/dL (ref 65–99)

## 2014-05-17 NOTE — Progress Notes (Signed)
  Echocardiogram 2D Echocardiogram has been performed.  [name] 05/17/2014, 10:07 AM

## 2014-05-17 NOTE — Progress Notes (Signed)
Patient Name: [name] Date of Encounter: 05/17/2014     Active Problems:   Unstable angina    SUBJECTIVE  The patient is doing well this morning.  He has been walking in the hall.  He has only minimal dyspnea and no chest pain.  His CT chest yesterday showed no pulmonary emboli.  His cardiac catheterization showed nonobstructive disease.  His echocardiogram has not yet been done.  CURRENT MEDS . aspirin  81 mg Oral Daily  . atenolol  50 mg Oral Daily  . heparin  5,000 Units Subcutaneous 3 times per day  . levothyroxine  150 mcg Oral QAC breakfast  . lisinopril  20 mg Oral Daily  . pantoprazole  40 mg Oral Daily  . simvastatin  40 mg Oral Daily  . sodium chloride  3 mL Intravenous Q12H  . sodium chloride  3 mL Intravenous Q12H  . Tiotropium Bromide-Olodaterol  2 puff Inhalation Daily    OBJECTIVE  Filed Vitals:   05/16/14 1701 05/16/14 1957 05/16/14 2349 05/17/14 0400  BP: 109/72 104/59 94/54 92/52   Pulse: 67 65 68 69  Temp:  98.6 F (37 C) 98.7 F (37.1 C) 98.4 F (36.9 C)  TempSrc:  Oral Oral Oral  Resp:  18 18 18   Height:      Weight:    235 lb 11.2 oz (106.913 kg)  SpO2: 96% 98% 91% 92%    Intake/Output Summary (Last 24 hours) at 05/17/14 0827 Last data filed at 05/17/14 0820  Gross per 24 hour  Intake    970 ml  Output    600 ml  Net    370 ml   Filed Weights   05/15/14 1314 05/17/14 0400  Weight: 242 lb (109.77 kg) 235 lb 11.2 oz (106.913 kg)    PHYSICAL EXAM  General: Pleasant, NAD. Neuro: Alert and oriented X 3. Moves all extremities spontaneously. Psych: Normal affect. HEENT:  Normal  Neck: Supple without bruits or JVD. Lungs:  Resp regular and unlabored, CTA. Heart: RRR no s3, s4, or murmurs. Abdomen: Soft, non-tender, non-distended, BS + x 4.  Extremities: No clubbing, cyanosis or edema. DP/PT/Radials 2+ and equal bilaterally.  Right radial pulse okay after catheter.  Accessory Clinical Findings  CBC  Recent Labs   05/15/14 1332 05/16/14 0115  WBC 5.1 6.5  NEUTROABS 2.3  --   HGB 15.0 14.3  HCT 44.7 41.9  MCV 90.3 89.1  PLT 200 846   Basic Metabolic Panel  Recent Labs  05/16/14 0115 05/17/14 0333  NA 137 138  K 3.4* 3.8  CL 106 106  CO2 23 23  GLUCOSE 107* 106*  BUN 13 12  CREATININE 1.09 1.11  CALCIUM 8.6* 8.4*   Liver Function Tests  Recent Labs  05/15/14 2010  AST 20  ALT 12*  ALKPHOS 43  BILITOT 1.1  PROT 6.4*  ALBUMIN 3.6   No results for input(s): LIPASE, AMYLASE in the last 72 hours. Cardiac Enzymes  Recent Labs  05/15/14 0054 05/15/14 2010 05/16/14 0650  TROPONINI <0.03 <0.03 <0.03   BNP Invalid input(s): POCBNP D-Dimer No results for input(s): DDIMER in the last 72 hours. Hemoglobin A1C No results for input(s): HGBA1C in the last 72 hours. Fasting Lipid Panel  Recent Labs  05/16/14 0115  CHOL 128  HDL 42  LDLCALC 70  TRIG 82  CHOLHDL 3.0   Thyroid Function Tests  Recent Labs  05/15/14 2010  TSH 0.756    TELE  Normal sinus rhythm  ECG    Radiology/Studies  Dg Chest 2 View  05/13/2014   CLINICAL DATA:  Chest tightness, shortness of breath for 2-3 weeks, former smoking history  EXAM: CHEST  2 VIEW  COMPARISON:  Chest x-ray 01/15/2013  FINDINGS: Mild linear atelectasis or scarring is noted at the lung bases. No focal infiltrate or effusion is seen. Mediastinal and hilar contours are unremarkable. The heart is within normal limits in size. No bony abnormality is seen.  IMPRESSION: No active cardiopulmonary disease.   Electronically Signed   By: Ivar Drape M.D.   On: 05/13/2014 10:56   Ct Angio Chest Pe W/cm &/or Wo Cm  05/16/2014   CLINICAL DATA:  Shortness of breath for the last 3 weeks. Intermittent chest pain. Recent cardiac catheterization.  EXAM: CT ANGIOGRAPHY CHEST WITH CONTRAST  TECHNIQUE: Multidetector CT imaging of the chest was performed using the standard protocol during bolus administration of intravenous contrast.  Multiplanar CT image reconstructions and MIPs were obtained to evaluate the vascular anatomy.  CONTRAST:  108mL OMNIPAQUE IOHEXOL 350 MG/ML SOLN  COMPARISON:  Radiography 05/15/2014  FINDINGS: Pulmonary arterial opacification is excellent. There are no pulmonary emboli. The aorta shows atherosclerotic change but no evidence of aneurysm or dissection. Bolus timing is not optimized for evaluation of the aorta. Coronary artery calcification is noted. No pleural or pericardial fluid. There is mild scarring at the lung bases. No consolidation or lobar collapse. No mediastinal or hilar mass or lymphadenopathy. Ordinary mild degenerative changes affect the spine. Scans in the upper abdomen are unremarkable, with a 1 cm cyst in the left lobe of the liver.  Review of the MIP images confirms the above findings.  IMPRESSION: No pulmonary emboli or evidence of other acute chest disease.  Coronary artery atherosclerotic disease.   Electronically Signed   By: Nelson Chimes M.D.   On: 05/16/2014 16:17   Dg Chest Portable 1 View  05/15/2014   CLINICAL DATA:  Mid chest pain for 1 day  EXAM: PORTABLE CHEST - 1 VIEW  COMPARISON:  05/13/2014  FINDINGS: The heart size and mediastinal contours are within normal limits. Both lungs are clear. The visualized skeletal structures are unremarkable.  IMPRESSION: No active disease.   Electronically Signed   By: Inez Catalina M.D.   On: 05/15/2014 13:30    ASSESSMENT AND PLAN 1.  Nonobstructive coronary disease.  Patent stents in circumflex and obtuse marginal 3.  Normal left ventricular function. 2.  Previous hypertension.  Blood pressure this morning still running low.  We will not restart ACE inhibitor yet.  We will not restart HCT Z yet.  Continue aspirin, beta blocker, simvastatin.  Plan: Await results of 2-D echo not yet done.  If echo is okay he should be able to be discharged later today with follow-up with Dr. Martinique or PA/NP at which time consideration of restarting ACE inhibitor  and diuretic can be addressed.  Signed, Warren Danes MD

## 2014-05-17 NOTE — Discharge Summary (Signed)
Discharge Summary   Patient ID: [name], MRN: [id number], DOB/AGE: 1945/12/14 69 y.o.  Admit date: 05/15/2014 Discharge date: 05/17/2014   Primary Care Physician:  [facility]   Primary Cardiologist:  Dr. Peter Martinique    Reason for Admission:  Unstable Angina   Primary Discharge Diagnoses:  Principal Problem:   Unstable angina - Patent Stents on Cath this admit Active Problems:   CAD (coronary artery disease)   Essential hypertension   COPD (chronic obstructive pulmonary disease)   Hyperlipidemia     Wt Readings from Last 3 Encounters:  05/17/14 235 lb 11.2 oz (106.913 kg)  05/15/14 242 lb (109.77 kg)  05/13/14 244 lb (110.678 kg)    Secondary Discharge Diagnoses:   Past Medical History  Diagnosis Date  . Coronary atherosclerosis of unspecified type of vessel, native or graft     a. LHC 5/16:  mLAD 20%, CFX stent ok, OM3 stent ok, mRCA 30%, normal LVF  . Unspecified essential hypertension   . Other and unspecified hyperlipidemia   . Chronic airway obstruction, not elsewhere classified   . Arthropathy, unspecified, site unspecified   . Thyroid disease     hypothyroid  . Anginal pain   . Hypothyroidism   . Headache(784.0)   . Arthritis     OSTEO BACK KNEES HIPS & HANDS  . Unspecified sleep apnea     Dr. Jacelyn Grip at [facility] told pt he had sleep apnea, but never sent him for a sleep study so he doesn't wear a CPAP      Allergies:   No Known Allergies    Procedures Performed This Admission:    Cardiac Catheterization 05/16/14  Mid LAD lesion, 20% stenosed.  Ost Cx to Mid Cx lesion, 10% stenosed. A drug-eluting stent was placed.  Dist Cx lesion, 5% stenosed.  Mid RCA lesion, 30% stenosed.  1. Mild nonobstructive CAD 2. Patent stents in LCx and OM 3.  3. Normal LV function.  Recommendation: etiology of chest pain is unclear. Will check Echo and CT of the chest with contrast.   Hospital Course:  [name] is a 69 y.o.  male with a hx of CAD s/p BMS to OM2 in 2001 and Taxus DES to proximal CFX in 2007,  HTN, HL, hypothyroidism, COPD and OSA.  He was seen urgently in the office by Dr. Peter Martinique 05/15/14 with complaints of 7-10 days of worsening dyspnea and chest pain.  He was noted to have rest symptoms in the office, unresponsive to NTG.  He was transported to [facility] via EMS.  CEs remained negative.  LHC done yesterday demonstrated patent stents in the LCx and OM3, non-obstructive CAD elsewhere and normal LVF.  Dr. Martinique recommended getting an Echo and Chest CT to rule out PE.  Chest CTA was negative for PE.  Echo is currently pending.  If his echo is ok, he will be DC later today.  Of note, his BP was running low.  His HCTZ and ACE inhibitor were held.  These can be resumed at FU if needed.       Discharge Vitals:   Blood pressure 92/52, pulse 69, temperature 98.4 F (36.9 C), temperature source Oral, resp. rate 18, height 6' (1.829 m), weight 235 lb 11.2 oz (106.913 kg), SpO2 92 %.   Labs:   Recent Labs  05/15/14 1332 05/16/14 0115  WBC 5.1 6.5  HGB 15.0 14.3  HCT 44.7 41.9  MCV 90.3 89.1  PLT 200 191  Recent Labs  05/15/14 1332 05/15/14 2010 05/16/14 0115 05/17/14 0333  NA 139  --  137 138  K 3.9  --  3.4* 3.8  CL 105  --  106 106  CO2 25  --  23 23  BUN 17  --  13 12  CREATININE 1.16  --  1.09 1.11  CALCIUM 9.1  --  8.6* 8.4*  PROT  --  6.4*  --   --   BILITOT  --  1.1  --   --   ALKPHOS  --  43  --   --   ALT  --  12*  --   --   AST  --  20  --   --      Recent Labs  05/15/14 0054 05/15/14 2010 05/16/14 0650  TROPONINI <0.03 <0.03 <0.03    Lab Results  Component Value Date   CHOL 128 05/16/2014   HDL 42 05/16/2014   LDLCALC 70 05/16/2014   TRIG 82 05/16/2014    Lab Results  Component Value Date   TSH 0.756 05/15/2014     Recent Labs  05/16/14 0115  INR 1.18     Diagnostic Procedures and Studies:  Dg Chest 2 View  05/13/2014     IMPRESSION: No active  cardiopulmonary disease.   Electronically Signed   By: Ivar Drape M.D.   On: 05/13/2014 10:56   Ct Angio Chest Pe W/cm &/or Wo Cm  05/16/2014    IMPRESSION: No pulmonary emboli or evidence of other acute chest disease.  Coronary artery atherosclerotic disease.   Electronically Signed   By: Nelson Chimes M.D.   On: 05/16/2014 16:17   Dg Chest Portable 1 View   05/15/2014     IMPRESSION: No active disease.   Electronically Signed   By: Inez Catalina M.D.   On: 05/15/2014 13:30    2D Echocardiogram 05/17/14 - Left ventricle: The cavity size was normal. Wall thickness was increased in a pattern of mild LVH. The estimated ejection fraction was 60%. Wall motion was normal; there were no regional wall motion abnormalities. - Aorta: Mild dilitation of aortic root. Aortic root dimension: 40 mm (ED). - Right ventricle: The cavity size was normal. Systolic function was normal.   Disposition:   Pt is being discharged home today in good condition.  Follow-up Plans & Appointments      Follow-up Information    Follow up with Peter Martinique, MD In 2 weeks.   Specialty:  Cardiology   Why:  The office will call to arrange hospital follow up with Dr. Martinique or one of the PAs or NPs in 2-3 weeks.   Contact information:   [place] [address] [phone]       Follow up with Claretta Fraise, MD.   Specialty:  Family Medicine   Why:  As needed   Contact information:   [place] [address] [phone]       Discharge Medications    Medication List    STOP taking these medications        lisinopril-hydrochlorothiazide 20-25 MG per tablet  Commonly known as:  PRINZIDE,ZESTORETIC      TAKE these medications        albuterol 108 (90 BASE) MCG/ACT inhaler  Commonly known as:  PROAIR HFA  Inhale 2 puffs into the lungs every 6 (six) hours as needed for wheezing or shortness of breath.     aspirin 325  MG tablet  Take 325 mg by mouth daily.       atenolol 50 MG tablet  Commonly known as:  TENORMIN  TAKE ONE TABLET BY MOUTH ONE TIME DAILY     Hydrocodone-Acetaminophen 7.5-300 MG Tabs  Take 1 tablet by mouth every 4 (four) hours as needed. 4 severe pain in right arm.     levothyroxine 175 MCG tablet  Commonly known as:  SYNTHROID, LEVOTHROID  TAKE 1 TABLET (175 MCG TOTAL) BY MOUTH DAILY.     nitroGLYCERIN 0.4 MG SL tablet  Commonly known as:  NITROSTAT  Place 1 tablet (0.4 mg total) under the tongue every 5 (five) minutes as needed for chest pain.     pantoprazole 40 MG tablet  Commonly known as:  PROTONIX  TAKE 1 TABLET (40 MG TOTAL) BY MOUTH DAILY.     simvastatin 40 MG tablet  Commonly known as:  ZOCOR  TAKE ONE TABLET BY MOUTH AT BEDTIME     Tiotropium Bromide-Olodaterol 2.5-2.5 MCG/ACT Aers  Commonly known as:  STIOLTO RESPIMAT  Inhale 2 puffs into the lungs daily.         Outstanding Labs/Studies  1. None   Duration of Discharge Encounter: Greater than 30 minutes including physician and PA time.  Signed, Richardson Dopp, PA-C  05/17/2014 11:36 AM

## 2014-05-17 NOTE — Discharge Instructions (Addendum)
Lisinopril/HCTZ was held because your blood pressure was running low.  Do not take this.  We can see if you need it at your follow up appointment.    Radial Site Care Refer to this sheet in the next few weeks. These instructions provide you with information on caring for yourself after your procedure. Your caregiver may also give you more specific instructions. Your treatment has been planned according to current medical practices, but problems sometimes occur. Call your caregiver if you have any problems or questions after your procedure.   HOME CARE INSTRUCTIONS  You may shower the day after the procedure.Remove the bandage (dressing) and gently wash the site with plain soap and water.Gently pat the site dry.  Do not apply powder or lotion to the site.  Do not submerge the affected site in water for 3 to 5 days.  Inspect the site at least twice daily.  Do not flex or bend the affected arm for 24 hours.  No lifting over 5 pounds (2.3 kg) for 5 days after your procedure.  Do not drive home if you are discharged the same day of the procedure. Have someone else drive you.  You may drive 24 hours after the procedure unless otherwise instructed by your caregiver.  Do not operate machinery or power tools for 24 hours.  A responsible adult should be with you for the first 24 hours after you arrive home. What to expect:  Any bruising will usually fade within 1 to 2 weeks.  Blood that collects in the tissue (hematoma) may be painful to the touch. It should usually decrease in size and tenderness within 1 to 2 weeks. SEEK IMMEDIATE MEDICAL CARE IF:  You have unusual pain at the radial site.  You have redness, warmth, swelling, or pain at the radial site.  You have drainage (other than a small amount of blood on the dressing).  You have chills.  You have a fever or persistent symptoms for more than 72 hours.  You have a fever and your symptoms suddenly get worse.  Your arm  becomes pale, cool, tingly, or numb.  You have heavy bleeding from the site. Hold pressure on the site. Document Released: 01/22/2010 Document Revised: 03/14/2011 Document Reviewed: 01/22/2010 [facility] Patient Information 2015 [place], [place]. This information is not intended to replace advice given to you by your health care provider. Make sure you discuss any questions you have with your health care provider.

## 2014-05-19 MED ORDER — TIOTROPIUM BROMIDE-OLODATEROL 2.5-2.5 MCG/ACT IN AERS: 2.0000 | INHALATION_SPRAY | Freq: Every day | RESPIRATORY_TRACT | Status: DC

## 2014-05-19 MED FILL — Heparin Sodium (Porcine) 2 Unit/ML in Sodium Chloride 0.9%: INTRAMUSCULAR | Qty: 1000 | Status: AC

## 2014-05-19 MED FILL — Lidocaine HCl Local Preservative Free (PF) Inj 1%: INTRAMUSCULAR | Qty: 30 | Status: AC

## 2014-05-19 NOTE — Telephone Encounter (Signed)
Stiolto has been sent to [facility].

## 2014-05-20 ENCOUNTER — Telehealth: Payer: Self-pay | Admitting: Family Medicine

## 2014-05-20 DIAGNOSIS — M129 Arthropathy, unspecified: Secondary | ICD-10-CM

## 2014-05-20 NOTE — Telephone Encounter (Signed)
Do you think miller will fill this, he has before but stacks did it last? Last filled on 04/15/14

## 2014-05-21 ENCOUNTER — Ambulatory Visit (HOSPITAL_COMMUNITY)
Admission: RE | Admit: 2014-05-21 | Discharge: 2014-05-21 | Disposition: A | Discharge status: Home / Self Care | Payer: Medicare Other | Source: Ambulatory Visit | Attending: Pulmonary Disease | Admitting: Pulmonary Disease

## 2014-05-21 ENCOUNTER — Telehealth: Payer: Self-pay | Admitting: Cardiology

## 2014-05-21 DIAGNOSIS — R0602 Shortness of breath: Secondary | ICD-10-CM | POA: Insufficient documentation

## 2014-05-21 LAB — PULMONARY FUNCTION TEST
DL/VA % PRED: 67 %
DL/VA: 3.17 ml/min/mmHg/L
DLCO cor % pred: 53 %
DLCO cor: 18.76 ml/min/mmHg
DLCO unc % pred: 53 %
DLCO unc: 18.6 ml/min/mmHg
FEF 25-75 Post: 2.34 L/sec
FEF 25-75 Pre: 1.71 L/sec
FEF2575-%Change-Post: 36 %
FEF2575-%Pred-Post: 86 %
FEF2575-%Pred-Pre: 63 %
FEV1-%CHANGE-POST: 10 %
FEV1-%PRED-POST: 83 %
FEV1-%Pred-Pre: 75 %
FEV1-Post: 2.94 L
FEV1-Pre: 2.66 L
FEV1FVC-%Change-Post: 1 %
FEV1FVC-%Pred-Pre: 93 %
FEV6-%Change-Post: 6 %
FEV6-%PRED-POST: 90 %
FEV6-%Pred-Pre: 84 %
FEV6-POST: 4.08 L
FEV6-PRE: 3.85 L
FEV6FVC-%Change-Post: -2 %
FEV6FVC-%PRED-PRE: 104 %
FEV6FVC-%Pred-Post: 102 %
FVC-%Change-Post: 8 %
FVC-%PRED-PRE: 80 %
FVC-%Pred-Post: 87 %
FVC-PRE: 3.86 L
FVC-Post: 4.19 L
POST FEV1/FVC RATIO: 70 %
POST FEV6/FVC RATIO: 97 %
PRE FEV6/FVC RATIO: 100 %
Pre FEV1/FVC ratio: 69 %
RV % PRED: 92 %
RV: 2.35 L
TLC % PRED: 85 %
TLC: 6.37 L

## 2014-05-21 MED ORDER — HYDROCODONE-ACETAMINOPHEN 7.5-300 MG PO TABS: 1.0000 | ORAL_TABLET | ORAL | Status: DC | PRN

## 2014-05-21 MED ORDER — ALBUTEROL SULFATE (2.5 MG/3ML) 0.083% IN NEBU
2.5000 mg | INHALATION_SOLUTION | Freq: Once | RESPIRATORY_TRACT | Status: AC
  Administered 2014-05-21: 2.5 mg via RESPIRATORY_TRACT

## 2014-05-21 NOTE — Telephone Encounter (Signed)
Left message on machine for patient to call.

## 2014-05-21 NOTE — Telephone Encounter (Signed)
Needs a TOC phone call .. Thanks  °

## 2014-05-21 NOTE — Telephone Encounter (Signed)
Rx requested by patient 

## 2014-05-21 NOTE — Telephone Encounter (Signed)
Pt aware written Rx is at front office ready for pickup 

## 2014-05-22 NOTE — Telephone Encounter (Signed)
Patient contacted regarding discharge from [facility] on 05-17-14  Patient understands to follow up with provider dr Martinique on 06-03-14 at 4:15 pm at [facility]. Patient understands discharge instructions? yes Patient understands medications and regiment? yes Patient understands to bring all medications to this visit? yes

## 2014-06-03 ENCOUNTER — Encounter: Payer: Self-pay | Admitting: Cardiology

## 2014-06-03 ENCOUNTER — Ambulatory Visit (INDEPENDENT_AMBULATORY_CARE_PROVIDER_SITE_OTHER): Payer: Medicare Other | Admitting: Cardiology

## 2014-06-03 VITALS — BP 98/72 | HR 68 | Ht 72.0 in | Wt 243.5 lb

## 2014-06-03 DIAGNOSIS — I2510 Atherosclerotic heart disease of native coronary artery without angina pectoris: Secondary | ICD-10-CM

## 2014-06-03 DIAGNOSIS — I10 Essential (primary) hypertension: Secondary | ICD-10-CM

## 2014-06-03 DIAGNOSIS — E785 Hyperlipidemia, unspecified: Secondary | ICD-10-CM | POA: Diagnosis not present

## 2014-06-03 NOTE — Patient Instructions (Signed)
Reduce lisinopril HCT to half a tablet daily.  Continue your other therapy  I will see you in 6 months.

## 2014-06-03 NOTE — Progress Notes (Signed)
[name] Date of Birth: 1945/03/31 Medical Record #[id number]  History of Present Illness: [name] is seen for post hospital follow up.  He has a history of coronary disease. His initial cardiac event was in 2001. He had stenting of the third obtuse marginal vessel with a bare-metal stent. He had repeat cardiac catheterization in 2003 including intravascular ultrasound with no obstructive disease. In 2007 he presented with recurrent angina and had stenting of the proximal left circumflex with a Taxus stent.  In 2010 he had another cardiac catheterization. This demonstrated moderate disease in the RCA and 70%. His stents were still patent. He was treated medically.  He had a Lexiscan myoview study in Nov. 2014 which showed a small lateral fixed defect consistent with scar or artifact. No ischemia. EF 59%.  He presented with symptoms of chest pain and dyspnea with radiation to the jaw. Symptoms were very concerning for unstable angina. He was admitted and cardiac cath showed no obstructive disease. Stents were still patent. Chest CT and echo were unremarkable. He is also seeing Dr. Lake Bells for pulmonary evaluation and PFTs were done. Since his hospital stay he denies any further chest or jaw pain.  Current Outpatient Prescriptions on File Prior to Visit  Medication Sig Dispense Refill  . albuterol (PROAIR HFA) 108 (90 BASE) MCG/ACT inhaler Inhale 2 puffs into the lungs every 6 (six) hours as needed for wheezing or shortness of breath. 1 Inhaler 2  . aspirin 325 MG tablet Take 325 mg by mouth daily.      Marland Kitchen atenolol (TENORMIN) 50 MG tablet TAKE ONE TABLET BY MOUTH ONE TIME DAILY 90 tablet 0  . Hydrocodone-Acetaminophen 7.5-300 MG TABS Take 1 tablet by mouth every 4 (four) hours as needed. 4 severe pain in right arm. 120 each 0  . levothyroxine (SYNTHROID, LEVOTHROID) 175 MCG tablet TAKE 1 TABLET (175 MCG TOTAL) BY MOUTH DAILY. 30 tablet 2  . nitroGLYCERIN (NITROSTAT) 0.4 MG SL tablet Place  1 tablet (0.4 mg total) under the tongue every 5 (five) minutes as needed for chest pain. 100 tablet 4  . pantoprazole (PROTONIX) 40 MG tablet TAKE 1 TABLET (40 MG TOTAL) BY MOUTH DAILY. 90 tablet 2  . simvastatin (ZOCOR) 40 MG tablet TAKE ONE TABLET BY MOUTH AT BEDTIME 30 tablet 5  . Tiotropium Bromide-Olodaterol (STIOLTO RESPIMAT) 2.5-2.5 MCG/ACT AERS Inhale 2 puffs into the lungs daily. 1 Inhaler 6   No current facility-administered medications on file prior to visit.    No Known Allergies  Past Medical History  Diagnosis Date  . Coronary atherosclerosis of unspecified type of vessel, native or graft     a. LHC 5/16:  mLAD 20%, CFX stent ok, OM3 stent ok, mRCA 30%, normal LVF  . Unspecified essential hypertension   . Other and unspecified hyperlipidemia   . Chronic airway obstruction, not elsewhere classified   . Arthropathy, unspecified, site unspecified   . Thyroid disease     hypothyroid  . Anginal pain   . Hypothyroidism   . Headache(784.0)   . Arthritis     OSTEO BACK KNEES HIPS & HANDS  . Unspecified sleep apnea     Dr. Jacelyn Grip at [facility] told pt he had sleep apnea, but never sent him for a sleep study so he doesn't wear a CPAP  . Hx of echocardiogram     a. Echo 5/16:  mild LVH, EF 60%, mildly dilated Ao root (40 mm)    Past Surgical History  Procedure  Laterality Date  . Coronary stent placement      2003, 2004  . Hip arthroplasty    . Ankle surgery    . Hemorrhoid surgery    . Tonsillectomy    . Cardiac catheterization  01/15/2013  . Colonoscopy N/A 05/02/2013    Procedure: COLONOSCOPY;  Surgeon: Rogene Houston, MD;  Location: [facility];  Service: Endoscopy;  Laterality: N/A;  200-moved to 1200 Ann notified pt  . Esophagogastroduodenoscopy N/A 05/02/2013    Procedure: ESOPHAGOGASTRODUODENOSCOPY (EGD);  Surgeon: Rogene Houston, MD;  Location: [facility];  Service: Endoscopy;  Laterality: N/A;  . Balloon dilation N/A 05/02/2013    Procedure: BALLOON  DILATION;  Surgeon: Rogene Houston, MD;  Location: [facility];  Service: Endoscopy;  Laterality: N/A;  Venia Minks dilation N/A 05/02/2013    Procedure: Venia Minks DILATION;  Surgeon: Rogene Houston, MD;  Location: [facility];  Service: Endoscopy;  Laterality: N/A;  . Savory dilation N/A 05/02/2013    Procedure: SAVORY DILATION;  Surgeon: Rogene Houston, MD;  Location: [facility];  Service: Endoscopy;  Laterality: N/A;  . Left heart catheterization with coronary angiogram N/A 01/15/2013    Procedure: LEFT HEART CATHETERIZATION WITH CORONARY ANGIOGRAM;  Surgeon: Sinclair Grooms, MD;  Location: [facility] CATH LAB;  Service: Cardiovascular;  Laterality: N/A;  . Knee surgery Left 12/2013  . Cardiac catheterization N/A 05/16/2014    Procedure: Left Heart Cath and Coronary Angiography;  Surgeon: Peter M Martinique, MD;  Location: [facility] CV LAB;  Service: Cardiovascular;  Laterality: N/A;    History  Smoking status  . Former Smoker -- 3.00 packs/day for 20 years  . Types: Cigarettes  . Quit date: 05/26/1975  Smokeless tobacco  . Former Systems developer  . Types: Chew  . Quit date: 01/04/2000    History  Alcohol Use No    Family History  Problem Relation Age of Onset  . Colon cancer Neg Hx   . CAD Father     MI at age 56  . Stroke Mother     Review of Systems: The review of systems is positive for severe arthritis in his knees, particularly on the left.  Dyspnea with any exertion.  All other systems were reviewed and are negative.  Physical Exam: BP 98/72 mmHg  Pulse 68  Ht 6' (1.829 m)  Wt 110.451 kg (243 lb 8 oz)  BMI 33.02 kg/m2 He is a pleasant white male in mild  distress. HEENT: Normal. Neck: No JVD, adenopathy, thyromegaly, or bruits. Lungs: Clear Cardiovascular: Regular rate and rhythm. Normal S1 and S2. No gallop, murmur, or click. No chest wall pain to palpation. Abdomen: Soft and nontender. No masses or bruits. Bowel sounds are positive. Extremities: No cyanosis or edema.  Pulses are 2+ and symmetric. Skin: Warm and dry Neuro: Alert and oriented x3. Cranial nerves II through XII are intact.  LABORATORY DATA: Lab Results  Component Value Date   WBC 6.5 05/16/2014   HGB 14.3 05/16/2014   HCT 41.9 05/16/2014   PLT 191 05/16/2014   GLUCOSE 106* 05/17/2014   CHOL 128 05/16/2014   TRIG 82 05/16/2014   HDL 42 05/16/2014   LDLDIRECT 70.0 04/12/2006   LDLCALC 70 05/16/2014   ALT 12* 05/15/2014   AST 20 05/15/2014   NA 138 05/17/2014   K 3.8 05/17/2014   CL 106 05/17/2014   CREATININE 1.11 05/17/2014   BUN 12 05/17/2014   CO2 23 05/17/2014   TSH  0.756 05/15/2014   INR 1.18 05/16/2014   HGBA1C  08/06/2008    5.8 (NOTE) The ADA recommends the following therapeutic goal for glycemic control related to Hgb A1c measurement: Goal of therapy: <6.5 Hgb A1c  Reference: American Diabetes Association: Clinical Practice Recommendations 2010, Diabetes Care, 2010, 33: (Suppl  1).   CT ANGIOGRAPHY CHEST WITH CONTRAST  TECHNIQUE: Multidetector CT imaging of the chest was performed using the standard protocol during bolus administration of intravenous contrast. Multiplanar CT image reconstructions and MIPs were obtained to evaluate the vascular anatomy.  CONTRAST: 85mL OMNIPAQUE IOHEXOL 350 MG/ML SOLN  COMPARISON: Radiography 05/15/2014  FINDINGS: Pulmonary arterial opacification is excellent. There are no pulmonary emboli. The aorta shows atherosclerotic change but no evidence of aneurysm or dissection. Bolus timing is not optimized for evaluation of the aorta. Coronary artery calcification is noted. No pleural or pericardial fluid. There is mild scarring at the lung bases. No consolidation or lobar collapse. No mediastinal or hilar mass or lymphadenopathy. Ordinary mild degenerative changes affect the spine. Scans in the upper abdomen are unremarkable, with a 1 cm cyst in the left lobe of the liver.  Review of the MIP images confirms the above  findings.  IMPRESSION: No pulmonary emboli or evidence of other acute chest disease.  Coronary artery atherosclerotic disease.   Electronically Signed  By: Nelson Chimes M.D.  On: 05/16/2014 16:17  Echo: 05/17/14:Study Conclusions  - Left ventricle: The cavity size was normal. Wall thickness was increased in a pattern of mild LVH. The estimated ejection fraction was 60%. Wall motion was normal; there were no regional wall motion abnormalities. - Aorta: Mild dilitation of aortic root. Aortic root dimension: 40 mm (ED). - Right ventricle: The cavity size was normal. Systolic function was normal.   Cardiac cath:  Conclusion     Mid LAD lesion, 20% stenosed.  Ost Cx to Mid Cx lesion, 10% stenosed. A drug-eluting stent was placed.  Dist Cx lesion, 5% stenosed.  Mid RCA lesion, 30% stenosed.  1. Mild nonobstructive CAD 2. Patent stents in LCx and OM 3.  3. Normal LV function.     Assessment / Plan: 1. Coronary disease status post stenting of the third obtuse marginal vessel and 2003 with a bare-metal stent. Status post stenting of the proximal left circumflex in 2007 with a Taxus DES. Recent cardiac cath showed patent stents with no obstructive CAD. Etiology of chest pain unclear. CT and Echo unremarkable. Fortunately symptoms have resolved. Continue medical therapy.  2. Hyperlipidemia. Continue statin therapy. Well controlled.  3. Hypertension-controlled. Continue lisinopril and atenolol.  4. COPD. Followed by Dr. Lake Bells.   5. Hypothyroidism. On replacement. TSH is normal.

## 2014-06-04 ENCOUNTER — Telehealth: Payer: Self-pay | Admitting: Pulmonary Disease

## 2014-06-04 NOTE — Telephone Encounter (Signed)
Please tell him it showed mild COPD

## 2014-06-04 NOTE — Telephone Encounter (Signed)
I spoke with patient about results and he verbalized understanding and had no questions 

## 2014-06-04 NOTE — Telephone Encounter (Signed)
PFT done 05/21/14 and is calling for results. Please advise thanks

## 2014-06-11 ENCOUNTER — Ambulatory Visit: Payer: Medicare Other | Admitting: Cardiology

## 2014-07-08 ENCOUNTER — Other Ambulatory Visit: Payer: Self-pay

## 2014-07-08 ENCOUNTER — Other Ambulatory Visit: Payer: Self-pay | Admitting: Family Medicine

## 2014-07-08 ENCOUNTER — Telehealth: Payer: Self-pay | Admitting: Family Medicine

## 2014-07-08 DIAGNOSIS — M129 Arthropathy, unspecified: Secondary | ICD-10-CM

## 2014-07-08 NOTE — Telephone Encounter (Signed)
Last seen 04/15/14 Dr Livia Snellen  If approved print

## 2014-07-09 ENCOUNTER — Encounter: Payer: Self-pay | Admitting: *Deleted

## 2014-07-16 ENCOUNTER — Ambulatory Visit (INDEPENDENT_AMBULATORY_CARE_PROVIDER_SITE_OTHER): Payer: Medicare Other | Admitting: Family Medicine

## 2014-07-16 ENCOUNTER — Encounter: Payer: Self-pay | Admitting: Family Medicine

## 2014-07-16 VITALS — BP 125/76 | HR 62 | Temp 97.6°F | Ht 72.0 in | Wt 244.0 lb

## 2014-07-16 DIAGNOSIS — I10 Essential (primary) hypertension: Secondary | ICD-10-CM | POA: Diagnosis not present

## 2014-07-16 DIAGNOSIS — M129 Arthropathy, unspecified: Secondary | ICD-10-CM

## 2014-07-16 DIAGNOSIS — E785 Hyperlipidemia, unspecified: Secondary | ICD-10-CM

## 2014-07-16 DIAGNOSIS — M1710 Unilateral primary osteoarthritis, unspecified knee: Secondary | ICD-10-CM

## 2014-07-16 DIAGNOSIS — J449 Chronic obstructive pulmonary disease, unspecified: Secondary | ICD-10-CM

## 2014-07-16 DIAGNOSIS — E079 Disorder of thyroid, unspecified: Secondary | ICD-10-CM

## 2014-07-16 DIAGNOSIS — N529 Male erectile dysfunction, unspecified: Secondary | ICD-10-CM | POA: Diagnosis not present

## 2014-07-16 MED ORDER — AMLODIPINE BESYLATE 5 MG PO TABS: 5.0000 mg | ORAL_TABLET | Freq: Every day | ORAL | Status: DC

## 2014-07-16 MED ORDER — HYDROCODONE-ACETAMINOPHEN 7.5-325 MG PO TABS: 1.0000 | ORAL_TABLET | ORAL | Status: DC | PRN

## 2014-07-16 MED ORDER — SILDENAFIL CITRATE 20 MG PO TABS: ORAL_TABLET | ORAL | Status: DC

## 2014-07-16 NOTE — Progress Notes (Signed)
Subjective:    Patient ID: [name], male    DOB: 07/17/1945, 69 y.o.   MRN: [id number]  HPI 69 year old male who is here to follow-up hypertension hyperlipidemia and chronic arthritis pain. He had called for refill for his hydrocodone and was told that he needed to come in for a visit. His regularly scheduled visit was in 1 month. The hydrocodone is effective and does not need to be changed  Today he is interested in treatment for ED. In reviewing his medicines he is on a beta blocker which may be impacting the ED.  Patient Active Problem List   Diagnosis Date Noted  . Unstable angina pectoris 05/15/2014  . Unstable angina - Patent Stents on Cath this admit 05/15/2014  . Hyperlipidemia 10/29/2013  . Dyspnea 09/02/2013  . Dysphagia, unspecified(787.20) 04/08/2013  . Colon adenoma 04/08/2013  . Need for diphtheria-tetanus-pertussis (Tdap) vaccine, adult/adolescent 04/04/2013  . Intermediate coronary syndrome 01/15/2013  . Chest pain with moderate risk of acute coronary syndrome- cath [facility] 01/15/13 01/15/2013  . Need for prophylactic vaccination against Streptococcus pneumoniae (pneumococcus) 12/24/2012  . Thyroid disease   . CAD (coronary artery disease)   . Bilateral lower extremity edema 09/06/2011  . GERD 09/18/2008  . HYPERLIPIDEMIA 05/01/2007  . Essential hypertension 05/01/2007  . Arthritis of knee 05/01/2007  . COPD (chronic obstructive pulmonary disease) 05/01/2007  . GI BLEED 05/01/2007  . Arthropathy 05/01/2007  . SLEEP APNEA 05/01/2007   Outpatient Encounter Prescriptions as of 07/16/2014  Medication Sig  . albuterol (PROAIR HFA) 108 (90 BASE) MCG/ACT inhaler Inhale 2 puffs into the lungs every 6 (six) hours as needed for wheezing or shortness of breath.  Marland Kitchen aspirin 325 MG tablet Take 325 mg by mouth daily.    Marland Kitchen atenolol (TENORMIN) 50 MG tablet TAKE ONE TABLET BY MOUTH ONE TIME DAILY  . Hydrocodone-Acetaminophen 7.5-300 MG TABS Take 1 tablet by mouth every 4  (four) hours as needed. 4 severe pain in right arm.  . levothyroxine (SYNTHROID, LEVOTHROID) 175 MCG tablet TAKE 1 TABLET (175 MCG TOTAL) BY MOUTH DAILY.  Marland Kitchen lisinopril-hydrochlorothiazide (PRINZIDE,ZESTORETIC) 20-25 MG per tablet Take 0.5 tablets by mouth daily.  . nitroGLYCERIN (NITROSTAT) 0.4 MG SL tablet Place 1 tablet (0.4 mg total) under the tongue every 5 (five) minutes as needed for chest pain.  . pantoprazole (PROTONIX) 40 MG tablet TAKE 1 TABLET (40 MG TOTAL) BY MOUTH DAILY.  . simvastatin (ZOCOR) 40 MG tablet TAKE ONE TABLET BY MOUTH AT BEDTIME  . Tiotropium Bromide-Olodaterol (STIOLTO RESPIMAT) 2.5-2.5 MCG/ACT AERS Inhale 2 puffs into the lungs daily.   No facility-administered encounter medications on file as of 07/16/2014.      Review of Systems  Respiratory: Negative.   Cardiovascular: Negative.   Gastrointestinal: Negative.   Neurological: Negative.   Psychiatric/Behavioral: Negative.        Objective:   Physical Exam  Constitutional: He is oriented to person, place, and time. He appears well-developed and well-nourished.  Cardiovascular: Normal rate and regular rhythm.   Pulmonary/Chest: Effort normal.  Neurological: He is alert and oriented to person, place, and time.    BP 125/76 mmHg  Pulse 62  Temp(Src) 97.6 F (36.4 C) (Oral)  Ht 6' (1.829 m)  Wt 244 lb (110.678 kg)  BMI 33.09 kg/m2       Assessment & Plan:  1. Thyroid disease Thyroid disease is stable. Continue same thyroid supplement  2. Hyperlipidemia As monitored by his cardiologist every 6 months  3. Essential hypertension Blood  pressure is good today but since he complains of ED I would like to taper him off of beta blocker and substitute calcium channel blocker to see if that helps the ED problem  4. Chronic obstructive pulmonary disease, unspecified COPD, unspecified chronic bronchitis type He denies problems with breathing the day  5. Arthritis of knee Wardell Honour MD  6.  Arthropathy Symptoms are managed with hydrocodone - HYDROcodone-acetaminophen (NORCO) 7.5-325 MG per tablet; Take 1 tablet by mouth every 4 (four) hours as needed for moderate pain. 4 severe pain in right arm.  Dispense: 120 tablet; Refill: 0

## 2014-07-17 ENCOUNTER — Other Ambulatory Visit (INDEPENDENT_AMBULATORY_CARE_PROVIDER_SITE_OTHER): Payer: Medicare Other

## 2014-07-17 DIAGNOSIS — N529 Male erectile dysfunction, unspecified: Secondary | ICD-10-CM | POA: Diagnosis not present

## 2014-07-18 LAB — TESTOSTERONE,FREE AND TOTAL
TESTOSTERONE FREE: 5.7 pg/mL — AB (ref 6.6–18.1)
TESTOSTERONE: 215 ng/dL — AB (ref 348–1197)

## 2014-07-21 ENCOUNTER — Telehealth: Payer: Self-pay | Admitting: *Deleted

## 2014-07-21 NOTE — Telephone Encounter (Signed)
Pt was given lab results this morning - he wanted to think about starting the testosterone   He calls back stating that he wants to start it, but wants the cheapest option.   Please order and we can call the pt.

## 2014-07-22 ENCOUNTER — Other Ambulatory Visit: Payer: Self-pay | Admitting: *Deleted

## 2014-07-22 MED ORDER — TESTOSTERONE 20.25 MG/1.25GM (1.62%) TD GEL: 2.0000 "application " | Freq: Every day | TRANSDERMAL | Status: DC

## 2014-07-22 MED ORDER — TESTOSTERONE 50 MG/5GM (1%) TD GEL: 10.0000 g | Freq: Every day | TRANSDERMAL | Status: DC

## 2014-07-22 NOTE — Telephone Encounter (Signed)
Patient aware androgel script ready.

## 2014-07-22 NOTE — Addendum Note (Signed)
Addended by: Zannie Cove on: 07/22/2014 08:20 AM   Modules accepted: Orders

## 2014-07-22 NOTE — Telephone Encounter (Signed)
Let's try AndroGel 1% directions are applied to shoulders upper arm 2 pumps once daily in the a.m.

## 2014-07-25 ENCOUNTER — Other Ambulatory Visit: Payer: Self-pay | Admitting: Family Medicine

## 2014-07-28 ENCOUNTER — Other Ambulatory Visit: Payer: Self-pay | Admitting: Family Medicine

## 2014-07-28 NOTE — Telephone Encounter (Signed)
lmtcb to get more information on his need for fluid pill and swelling

## 2014-08-02 ENCOUNTER — Other Ambulatory Visit: Payer: Self-pay | Admitting: Family Medicine

## 2014-08-04 ENCOUNTER — Other Ambulatory Visit: Payer: Self-pay | Admitting: Family Medicine

## 2014-08-05 ENCOUNTER — Telehealth: Payer: Self-pay

## 2014-08-05 NOTE — Telephone Encounter (Signed)
Insurance prior authorized Androgel from 01/01/14 to 01/02/14

## 2014-08-06 ENCOUNTER — Ambulatory Visit: Payer: Medicare Other | Admitting: Family Medicine

## 2014-08-06 ENCOUNTER — Other Ambulatory Visit: Payer: Self-pay | Admitting: Family Medicine

## 2014-08-07 NOTE — Telephone Encounter (Signed)
Returned patient phone call.  Patient is wanting a fluid pill.  He states he was previously on a fluid pill and a different BP medication.  He is also having swelling in his hands, legs and feet.

## 2014-08-09 NOTE — Telephone Encounter (Signed)
Give Lasix 20 mg qd prn #12

## 2014-08-11 MED ORDER — FUROSEMIDE 20 MG PO TABS: 20.0000 mg | ORAL_TABLET | Freq: Every day | ORAL | Status: DC

## 2014-08-13 ENCOUNTER — Encounter: Payer: Self-pay | Admitting: Family Medicine

## 2014-08-13 ENCOUNTER — Ambulatory Visit (INDEPENDENT_AMBULATORY_CARE_PROVIDER_SITE_OTHER): Payer: Medicare Other | Admitting: Family Medicine

## 2014-08-13 VITALS — BP 153/83 | HR 61 | Temp 97.3°F | Ht 72.0 in | Wt 247.0 lb

## 2014-08-13 DIAGNOSIS — I2510 Atherosclerotic heart disease of native coronary artery without angina pectoris: Secondary | ICD-10-CM

## 2014-08-13 DIAGNOSIS — I10 Essential (primary) hypertension: Secondary | ICD-10-CM

## 2014-08-13 NOTE — Progress Notes (Signed)
Subjective:    Patient ID: [name], male    DOB: [birth year], 69 y.o.   MRN: [id number]  HPI 69 year old gentleman here to follow-up hypertension. At his last visit, we initiated discontinuation and taper of beta blocker since he is having ongoing problems with erectile dysfunction. In the place of beta blocker added 5 mg of amlodipine. Lisinopril, which he also took, was expected to be continued but for some reason he stopped that as well. He has had some swelling in his legs and feet. I doubt that that is related to the amlodipine more related to the fact that he stopped the lisinopril and hydrochlorothiazide combination.    Review of Systems  Constitutional: Negative.   HENT: Negative.   Respiratory: Negative.   Cardiovascular: Negative.   Genitourinary: Negative.   Neurological: Negative.   Hematological: Negative.   Psychiatric/Behavioral: Negative.    Patient Active Problem List   Diagnosis Date Noted  . Unstable angina pectoris 05/15/2014  . Unstable angina - Patent Stents on Cath this admit 05/15/2014  . Hyperlipidemia 10/29/2013  . Dyspnea 09/02/2013  . Dysphagia, unspecified(787.20) 04/08/2013  . Colon adenoma 04/08/2013  . Need for diphtheria-tetanus-pertussis (Tdap) vaccine, adult/adolescent 04/04/2013  . Intermediate coronary syndrome 01/15/2013  . Chest pain with moderate risk of acute coronary syndrome- cath [facility] 01/15/13 01/15/2013  . Need for prophylactic vaccination against Streptococcus pneumoniae (pneumococcus) 12/24/2012  . Thyroid disease   . CAD (coronary artery disease)   . Bilateral lower extremity edema 09/06/2011  . GERD 09/18/2008  . HYPERLIPIDEMIA 05/01/2007  . Essential hypertension 05/01/2007  . Arthritis of knee 05/01/2007  . COPD (chronic obstructive pulmonary disease) 05/01/2007  . GI BLEED 05/01/2007  . Arthropathy 05/01/2007  . SLEEP APNEA 05/01/2007   Outpatient Encounter Prescriptions as of 08/13/2014  Medication Sig  .  albuterol (PROAIR HFA) 108 (90 BASE) MCG/ACT inhaler Inhale 2 puffs into the lungs every 6 (six) hours as needed for wheezing or shortness of breath.  Marland Kitchen amLODipine (NORVASC) 5 MG tablet Take 1 tablet (5 mg total) by mouth daily.  Marland Kitchen aspirin 325 MG tablet Take 325 mg by mouth daily.    Marland Kitchen atenolol (TENORMIN) 50 MG tablet TAKE ONE TABLET BY MOUTH ONE TIME DAILY  . furosemide (LASIX) 20 MG tablet Take 1 tablet (20 mg total) by mouth daily. Prn  . HYDROcodone-acetaminophen (NORCO) 7.5-325 MG per tablet Take 1 tablet by mouth every 4 (four) hours as needed for moderate pain. 4 severe pain in right arm.  . levothyroxine (SYNTHROID, LEVOTHROID) 175 MCG tablet TAKE 1 TABLET (175 MCG TOTAL) BY MOUTH DAILY.  . nitroGLYCERIN (NITROSTAT) 0.4 MG SL tablet Place 1 tablet (0.4 mg total) under the tongue every 5 (five) minutes as needed for chest pain.  . pantoprazole (PROTONIX) 40 MG tablet TAKE 1 TABLET (40 MG TOTAL) BY MOUTH DAILY.  . sildenafil (REVATIO) 20 MG tablet Take 1-2 tablets as needed  . simvastatin (ZOCOR) 40 MG tablet TAKE ONE TABLET BY MOUTH AT BEDTIME  . Testosterone (ANDROGEL) 20.25 MG/1.25GM (1.62%) GEL Place 2 application onto the skin daily.  . Tiotropium Bromide-Olodaterol (STIOLTO RESPIMAT) 2.5-2.5 MCG/ACT AERS Inhale 2 puffs into the lungs daily.  Marland Kitchen lisinopril-hydrochlorothiazide (PRINZIDE,ZESTORETIC) 20-25 MG per tablet TAKE ONE TABLET BY MOUTH ONE TIME DAILY   No facility-administered encounter medications on file as of 08/13/2014.       Objective:   Physical Exam  Constitutional: He is oriented to person, place, and time. He appears well-nourished.  Cardiovascular: Normal rate  and regular rhythm.   Pulmonary/Chest: Effort normal and breath sounds normal.  Neurological: He is alert and oriented to person, place, and time.  Psychiatric: He has a normal mood and affect.          Assessment & Plan:   1. Essential hypertension Pressure is not well controlled today but there has  been confusion about medications. Spent some time discussing what he should be on which now includes lisinopril hydrochlorothiazide combo and amlodipine. His wife who is a retired Marine scientist will monitor blood pressure at home  2. Coronary artery disease involving native coronary artery of native heart without angina pectoris He has had coronary stenting 3. He has never used nitroglycerin. He is taking only aspirin and not Plavix at this time  Wardell Honour MD

## 2014-08-21 ENCOUNTER — Telehealth: Payer: Self-pay | Admitting: Family Medicine

## 2014-08-21 DIAGNOSIS — M129 Arthropathy, unspecified: Secondary | ICD-10-CM

## 2014-08-22 ENCOUNTER — Encounter: Payer: Self-pay | Admitting: Pulmonary Disease

## 2014-08-22 ENCOUNTER — Ambulatory Visit (INDEPENDENT_AMBULATORY_CARE_PROVIDER_SITE_OTHER): Payer: Medicare Other | Admitting: Pulmonary Disease

## 2014-08-22 VITALS — BP 114/68 | HR 68 | Ht 71.5 in | Wt 243.0 lb

## 2014-08-22 DIAGNOSIS — J449 Chronic obstructive pulmonary disease, unspecified: Secondary | ICD-10-CM

## 2014-08-22 NOTE — Assessment & Plan Note (Signed)
[name] continues to complain of shortness of breath on exertion. I personally reviewed the images from his CT chest when he was hospitalized in May 2016 which showed centrilobular emphysema and an upper lobe distribution. There is some very mild, nonspecific fibrotic changes in the bases of his lungs which appear to be consistent mostly with atelectasis and have not changed compared to previous studies. In short, I do not believe that he has a separate pulmonary process apart from his COPD with emphysema.  Deconditioning certainly contributes to his ongoing shortness of breath.  Plan: Continue Stiolto Continue albuterol as needed I encouraged him to exercise as much as possible and to lose weight Follow-up in one year or sooner if needed Get a flu shot in the fall

## 2014-08-22 NOTE — Progress Notes (Signed)
Subjective:    Patient ID: [name], male    DOB: 1945-03-30, 69 y.o.   MRN: [id number]  Synopsis: GOLD COPD grade A, first saw [name] in 2014. 12/05/2012 simple spirometry> ratio 70% (predicted ratio 77%), clear airflow obstruction on flow volume loop, FEV1 2.50 L (60% predicted) May 2016 Frey function testing ratio 70%, FEV1 2.94 L (83% predicted), total lung capacity 6.37 L (85% predicted), DLCO 18.60 (53% predicted)  HPI Chief Complaint  Patient presents with  . Follow-up    pt c/o unchanged DOE, prod cough with clear foamy mucus.     [name] says that he has been doing fair. He says that he is still short of breath, still bad on hills and steps. He is still using albuterol 2-3 times per day.  Still using Stiolto.  He typically uses the albuterol when exerting himself.  He is having problems with the heat. No chest pain or leg swelling. He was hospitalized a few weeks ago for chest pain, ruled out for MI Not coughing more now.    Past Medical History  Diagnosis Date  . Coronary atherosclerosis of unspecified type of vessel, native or graft     a. LHC 5/16:  mLAD 20%, CFX stent ok, OM3 stent ok, mRCA 30%, normal LVF  . Unspecified essential hypertension   . Other and unspecified hyperlipidemia   . Chronic airway obstruction, not elsewhere classified   . Arthropathy, unspecified, site unspecified   . Thyroid disease     hypothyroid  . Anginal pain   . Hypothyroidism   . Headache(784.0)   . Arthritis     OSTEO BACK KNEES HIPS & HANDS  . Unspecified sleep apnea     [name] at [facility] told pt he had sleep apnea, but never sent him for a sleep study so he doesn't wear a CPAP  . Hx of echocardiogram     a. Echo 5/16:  mild LVH, EF 60%, mildly dilated Ao root (40 mm)     Review of Systems  Constitutional: Positive for fatigue. Negative for fever and chills.  HENT: Negative for postnasal drip, rhinorrhea and sinus pressure.     Respiratory: Positive for shortness of breath and wheezing. Negative for cough.   Cardiovascular: Negative for chest pain, palpitations and leg swelling.       Objective:   Physical Exam Filed Vitals:   08/22/14 1153  BP: 114/68  Pulse: 68  Height: 5' 11.5" (1.816 m)  Weight: 243 Charles (110.224 kg)  SpO2: 97%   RA  Gen: well appearing, no acute distress HEENT: NCAT, EOMi, OP clear  PULM: CTA B CV: RRR, no mgr, no JVD GI: BS+, soft, nontender,  Ext: warm, no edema, no clubbing, no cyanosis Derm: no rash or skin breakdown Neuro: A&Ox4, MAEW       Assessment & Plan:   COPD (chronic obstructive Frey disease) [name] continues to complain of shortness of breath on exertion. I personally reviewed the images from his CT chest when he was hospitalized in May 2016 which showed centrilobular emphysema and an upper lobe distribution. There is some very mild, nonspecific fibrotic changes in the bases of his lungs which appear to be consistent mostly with atelectasis and have not changed compared to previous studies. In short, I do not believe that he has a separate Frey process apart from his COPD with emphysema.  Deconditioning certainly contributes to his ongoing shortness of breath.  Plan: Continue Stiolto Continue albuterol  as needed I encouraged him to exercise as much as possible and to lose weight Follow-up in one year or sooner if needed Get a flu shot in the fall    Updated Medication List Outpatient Encounter Prescriptions as of 08/22/2014  Medication Sig  . albuterol (PROAIR HFA) 108 (90 BASE) MCG/ACT inhaler Inhale 2 puffs into the lungs every 6 (six) hours as needed for wheezing or shortness of breath.  Marland Kitchen amLODipine (NORVASC) 5 MG tablet Take 1 tablet (5 mg total) by mouth daily.  Marland Kitchen aspirin 325 MG tablet Take 325 mg by mouth daily.    Marland Kitchen atenolol (TENORMIN) 50 MG tablet TAKE ONE TABLET BY MOUTH ONE TIME DAILY  . furosemide (LASIX) 20 MG tablet Take 1  tablet (20 mg total) by mouth daily. Prn  . HYDROcodone-acetaminophen (NORCO) 7.5-325 MG per tablet Take 1 tablet by mouth every 4 (four) hours as needed for moderate pain. 4 severe pain in right arm.  . levothyroxine (SYNTHROID, LEVOTHROID) 175 MCG tablet TAKE 1 TABLET (175 MCG TOTAL) BY MOUTH DAILY.  Marland Kitchen lisinopril-hydrochlorothiazide (PRINZIDE,ZESTORETIC) 20-25 MG per tablet TAKE ONE TABLET BY MOUTH ONE TIME DAILY  . nitroGLYCERIN (NITROSTAT) 0.4 MG SL tablet Place 1 tablet (0.4 mg total) under the tongue every 5 (five) minutes as needed for chest pain.  . pantoprazole (PROTONIX) 40 MG tablet TAKE 1 TABLET (40 MG TOTAL) BY MOUTH DAILY.  . sildenafil (REVATIO) 20 MG tablet Take 1-2 tablets as needed  . simvastatin (ZOCOR) 40 MG tablet TAKE ONE TABLET BY MOUTH AT BEDTIME  . Testosterone (ANDROGEL) 20.25 MG/1.25GM (1.62%) GEL Place 2 application onto the skin daily.  . Tiotropium Bromide-Olodaterol (STIOLTO RESPIMAT) 2.5-2.5 MCG/ACT AERS Inhale 2 puffs into the lungs daily.   No facility-administered encounter medications on file as of 08/22/2014.

## 2014-08-22 NOTE — Patient Instructions (Signed)
Keep taking your medications as you're doing Get a flu shot in the fall. We will see you back in one year or sooner if your symptoms are getting worse.

## 2014-08-26 MED ORDER — HYDROCODONE-ACETAMINOPHEN 7.5-325 MG PO TABS: 1.0000 | ORAL_TABLET | ORAL | Status: DC | PRN

## 2014-08-26 NOTE — Telephone Encounter (Signed)
Rx refilled as requested.  

## 2014-08-26 NOTE — Telephone Encounter (Signed)
Patient picked up rx.

## 2014-09-15 ENCOUNTER — Other Ambulatory Visit: Payer: Self-pay | Admitting: Family Medicine

## 2014-09-23 ENCOUNTER — Telehealth: Payer: Self-pay | Admitting: Family Medicine

## 2014-09-23 DIAGNOSIS — M129 Arthropathy, unspecified: Secondary | ICD-10-CM

## 2014-09-24 NOTE — Telephone Encounter (Signed)
Last filled 08/26/14, last seen 08/13/14. Rx will print

## 2014-09-25 ENCOUNTER — Encounter: Payer: Self-pay | Admitting: Gastroenterology

## 2014-09-26 MED ORDER — HYDROCODONE-ACETAMINOPHEN 7.5-325 MG PO TABS: 1.0000 | ORAL_TABLET | ORAL | Status: DC | PRN

## 2014-09-26 NOTE — Telephone Encounter (Signed)
Dr. Sabra Heck did not address this, the pt is upset and wants today, he did call and ask for this on Tuesday. Rx will print, have nurse call pt to pick up today

## 2014-10-01 ENCOUNTER — Ambulatory Visit (INDEPENDENT_AMBULATORY_CARE_PROVIDER_SITE_OTHER): Payer: Medicare Other

## 2014-10-01 DIAGNOSIS — Z23 Encounter for immunization: Secondary | ICD-10-CM

## 2014-10-29 ENCOUNTER — Telehealth: Payer: Self-pay | Admitting: Family Medicine

## 2014-10-29 DIAGNOSIS — M129 Arthropathy, unspecified: Secondary | ICD-10-CM

## 2014-10-29 NOTE — Telephone Encounter (Signed)
Last filled 09/26/14, last seen 08/13/14. Rx will print, call pt to pick up

## 2014-10-30 ENCOUNTER — Telehealth: Payer: Self-pay | Admitting: *Deleted

## 2014-10-30 MED ORDER — HYDROCODONE-ACETAMINOPHEN 7.5-325 MG PO TABS: 1.0000 | ORAL_TABLET | ORAL | Status: DC | PRN

## 2014-10-30 NOTE — Telephone Encounter (Signed)
Pt notified RX is ready for pick up RX to front for pt pick up

## 2014-10-30 NOTE — Telephone Encounter (Signed)
Patient aware script can be picked up at front desk  

## 2014-11-20 ENCOUNTER — Encounter: Payer: Self-pay | Admitting: Cardiology

## 2014-11-20 ENCOUNTER — Ambulatory Visit (INDEPENDENT_AMBULATORY_CARE_PROVIDER_SITE_OTHER): Payer: Medicare Other | Admitting: Cardiology

## 2014-11-20 VITALS — BP 124/82 | HR 83 | Ht 72.0 in | Wt 245.2 lb

## 2014-11-20 DIAGNOSIS — J449 Chronic obstructive pulmonary disease, unspecified: Secondary | ICD-10-CM

## 2014-11-20 DIAGNOSIS — E785 Hyperlipidemia, unspecified: Secondary | ICD-10-CM

## 2014-11-20 DIAGNOSIS — I2510 Atherosclerotic heart disease of native coronary artery without angina pectoris: Secondary | ICD-10-CM

## 2014-11-20 DIAGNOSIS — I10 Essential (primary) hypertension: Secondary | ICD-10-CM | POA: Diagnosis not present

## 2014-11-20 NOTE — Progress Notes (Signed)
[name] Date of Birth: 1945-09-06 Medical Record [id number]  History of Present Illness: [name] is seen for follow up CAD.  He has a history of coronary disease. His initial cardiac event was in 2001. He had stenting of the third obtuse marginal vessel with a bare-metal stent. He had repeat cardiac catheterization in 2003 including intravascular ultrasound with no obstructive disease. In 2007 he presented with recurrent angina and had stenting of the proximal left circumflex with a Taxus stent.  In 2010 he had another cardiac catheterization. This demonstrated moderate disease in the RCA and 70%. His stents were still patent. He was treated medically.  He had a Lexiscan myoview study in Nov. 2014 which showed a small lateral fixed defect consistent with scar or artifact. No ischemia. EF 59%.  In May 2016 he presented with symptoms of chest pain and dyspnea with radiation to the jaw.  He was admitted and cardiac cath showed no obstructive disease. Stents were still patent. Chest CT and echo were unremarkable. He is also seeing Dr. Lake Bells for COPD/emphysema. On follow up today he states he is doing well. No chest pain. Breathing is OK. He stays active.   Current Outpatient Prescriptions on File Prior to Visit  Medication Sig Dispense Refill  . albuterol (PROAIR HFA) 108 (90 BASE) MCG/ACT inhaler Inhale 2 puffs into the lungs every 6 (six) hours as needed for wheezing or shortness of breath. 1 Inhaler 2  . amLODipine (NORVASC) 5 MG tablet TAKE 1 TABLET (5 MG TOTAL) BY MOUTH DAILY. 30 tablet 4  . aspirin 325 MG tablet Take 325 mg by mouth daily.      Marland Kitchen atenolol (TENORMIN) 50 MG tablet TAKE ONE TABLET BY MOUTH ONE TIME DAILY 90 tablet 0  . furosemide (LASIX) 20 MG tablet Take 1 tablet (20 mg total) by mouth daily. Prn 12 tablet 0  . HYDROcodone-acetaminophen (NORCO) 7.5-325 MG tablet Take 1 tablet by mouth every 4 (four) hours as needed for moderate pain. 4 severe pain in right arm.  120 tablet 0  . levothyroxine (SYNTHROID, LEVOTHROID) 175 MCG tablet TAKE 1 TABLET (175 MCG TOTAL) BY MOUTH DAILY. 30 tablet 9  . lisinopril-hydrochlorothiazide (PRINZIDE,ZESTORETIC) 20-25 MG per tablet TAKE ONE TABLET BY MOUTH ONE TIME DAILY 90 tablet 1  . nitroGLYCERIN (NITROSTAT) 0.4 MG SL tablet Place 1 tablet (0.4 mg total) under the tongue every 5 (five) minutes as needed for chest pain. 100 tablet 4  . pantoprazole (PROTONIX) 40 MG tablet TAKE 1 TABLET (40 MG TOTAL) BY MOUTH DAILY. 90 tablet 2  . sildenafil (REVATIO) 20 MG tablet Take 1-2 tablets as needed 15 tablet 2  . simvastatin (ZOCOR) 40 MG tablet TAKE ONE TABLET BY MOUTH AT BEDTIME 30 tablet 3  . Testosterone (ANDROGEL) 20.25 MG/1.25GM (1.62%) GEL Place 2 application onto the skin daily. 75 g 1  . Tiotropium Bromide-Olodaterol (STIOLTO RESPIMAT) 2.5-2.5 MCG/ACT AERS Inhale 2 puffs into the lungs daily. 1 Inhaler 6   No current facility-administered medications on file prior to visit.    No Known Allergies  Past Medical History  Diagnosis Date  . Coronary atherosclerosis of unspecified type of vessel, native or graft     a. LHC 5/16:  mLAD 20%, CFX stent ok, OM3 stent ok, mRCA 30%, normal LVF  . Unspecified essential hypertension   . Other and unspecified hyperlipidemia   . Chronic airway obstruction, not elsewhere classified   . Arthropathy, unspecified, site unspecified   . Thyroid disease  hypothyroid  . Anginal pain ([place])   . Hypothyroidism   . Headache(784.0)   . Arthritis     OSTEO BACK KNEES HIPS & HANDS  . Unspecified sleep apnea     Dr. Jacelyn Grip at [facility] told pt he had sleep apnea, but never sent him for a sleep study so he doesn't wear a CPAP  . Hx of echocardiogram     a. Echo 5/16:  mild LVH, EF 60%, mildly dilated Ao root (40 mm)    Past Surgical History  Procedure Laterality Date  . Coronary stent placement      2003, 2004  . Hip arthroplasty    . Ankle surgery    . Hemorrhoid surgery     . Tonsillectomy    . Cardiac catheterization  01/15/2013  . Colonoscopy N/A 05/02/2013    Procedure: COLONOSCOPY;  Surgeon: Rogene Houston, MD;  [facility];  Service: Endoscopy;  Laterality: N/A;  200-moved to 1200 Ann notified pt  . Esophagogastroduodenoscopy N/A 05/02/2013    Procedure: ESOPHAGOGASTRODUODENOSCOPY (EGD);  Surgeon: Rogene Houston, MD;  Location: [facility];  Service: Endoscopy;  Laterality: N/A;  . Balloon dilation N/A 05/02/2013    Procedure: BALLOON DILATION;  Surgeon: Rogene Houston, MD;  Location: [facility];  Service: Endoscopy;  Laterality: N/A;  Venia Minks dilation N/A 05/02/2013    Procedure: Venia Minks DILATION;  Surgeon: Rogene Houston, MD;  Location: [facility];  Service: Endoscopy;  Laterality: N/A;  . Savory dilation N/A 05/02/2013    Procedure: SAVORY DILATION;  Surgeon: Rogene Houston, MD;  Location: [facility];  Service: Endoscopy;  Laterality: N/A;  . Left heart catheterization with coronary angiogram N/A 01/15/2013    Procedure: LEFT HEART CATHETERIZATION WITH CORONARY ANGIOGRAM;  Surgeon: Sinclair Grooms, MD;  Location: [facility] C[facility] LAB;  Service: Cardiovascular;  Laterality: N/A;  . Knee surgery Left 12/2013  . Cardiac catheterization N/A 05/16/2014    Procedure: Left Heart Cath and Coronary Angiography;  Surgeon: Hendrick Pavich M Martinique, MD;  Location: [place] CV LAB;  Service: Cardiovascular;  Laterality: N/A;    History  Smoking status  . Former Smoker -- 3.00 packs/day for 20 years  . Types: Cigarettes  . Quit date: 05/26/1975  Smokeless tobacco  . Former Systems developer  . Types: Chew  . Quit date: 01/04/2000    History  Alcohol Use No    Family History  Problem Relation Age of Onset  . Colon cancer Neg Hx   . CAD Father     MI at age 16  . Stroke Mother     Review of Systems: The review of systems is as noted in HPI.  All other systems were reviewed and are negative.  Physical Exam: BP 124/82 mmHg  Pulse 83  Ht 6' (1.829 m)   Wt 111.222 kg (245 lb 3.2 oz)  BMI 33.25 kg/m2 He is a pleasant white male in mild  distress. HEENT: Normal. Neck: No JVD, adenopathy, thyromegaly, or bruits. Lungs: Clear Cardiovascular: Regular rate and rhythm. Normal S1 and S2. No gallop, murmur, or click. No chest wall pain to palpation. Abdomen: Soft and nontender. No masses or bruits. Bowel sounds are positive. Extremities: No cyanosis or edema. Pulses are 2+ and symmetric. Skin: Warm and dry Neuro: Alert and oriented x3. Cranial nerves II through XII are intact.  LABORATORY DATA: Lab Results  Component Value Date   WBC 6.5 05/16/2014   HGB 14.3 05/16/2014  HCT 41.9 05/16/2014   PLT 191 05/16/2014   GLUCOSE 106* 05/17/2014   CHOL 128 05/16/2014   TRIG 82 05/16/2014   HDL 42 05/16/2014   LDLDIRECT 70.0 04/12/2006   LDLCALC 70 05/16/2014   ALT 12* 05/15/2014   AST 20 05/15/2014   NA 138 05/17/2014   K 3.8 05/17/2014   CL 106 05/17/2014   CREATININE 1.11 05/17/2014   BUN 12 05/17/2014   CO2 23 05/17/2014   TSH 0.756 05/15/2014   INR 1.18 05/16/2014   HGBA1C  08/06/2008    5.8 (NOTE) The ADA recommends the following therapeutic goal for glycemic control related to Hgb A1c measurement: Goal of therapy: <6.5 Hgb A1c  Reference: American Diabetes Association: Clinical Practice Recommendations 2010, Diabetes Care, 2010, 33: (Suppl  1).      Assessment / Plan: 1. Coronary disease status post stenting of the third obtuse marginal vessel and 2003 with a bare-metal stent. Status post stenting of the proximal left circumflex in 2007 with a Taxus DES. Cardiac cath May 2016 showed patent stents with no obstructive CAD.  CT and Echo unremarkable.  Continue medical therapy. He is asymptomatic. Follow up in one year.  2. Hyperlipidemia. Continue statin therapy. Labs followed by primary care.  3. Hypertension-controlled. Continue lisinopril and atenolol.  4. COPD. Followed by Dr. Lake Bells.   5. Hypothyroidism. On replacement.  TSH is normal.

## 2014-11-20 NOTE — Patient Instructions (Signed)
Continue your current therapy  I will see you in one year   

## 2014-12-01 ENCOUNTER — Telehealth: Payer: Self-pay | Admitting: Family Medicine

## 2014-12-01 DIAGNOSIS — M129 Arthropathy, unspecified: Secondary | ICD-10-CM

## 2014-12-01 NOTE — Telephone Encounter (Signed)
[facility] pt, last filled 10/30/14, last seen 08/13/14

## 2014-12-02 MED ORDER — HYDROCODONE-ACETAMINOPHEN 7.5-325 MG PO TABS: 1.0000 | ORAL_TABLET | ORAL | Status: DC | PRN

## 2014-12-02 NOTE — Telephone Encounter (Signed)
Pt aware written Rx is at front desk ready for pickup  

## 2014-12-17 ENCOUNTER — Other Ambulatory Visit: Payer: Self-pay | Admitting: Family Medicine

## 2014-12-18 NOTE — Telephone Encounter (Signed)
Last seen 08/14/14  Dr Sabra Heck  Last lipid 05/16/14

## 2014-12-18 NOTE — Telephone Encounter (Signed)
This is okay to refill 1. Please make sure that the patient gets a fasting lipid panel with liver function tests

## 2014-12-29 ENCOUNTER — Other Ambulatory Visit (INDEPENDENT_AMBULATORY_CARE_PROVIDER_SITE_OTHER): Payer: Self-pay | Admitting: Internal Medicine

## 2015-01-13 ENCOUNTER — Other Ambulatory Visit: Payer: Self-pay | Admitting: Family Medicine

## 2015-01-14 NOTE — Telephone Encounter (Signed)
Last seen 08/13/14 Dr Sabra Heck  Last lipid 05/16/14

## 2015-01-22 ENCOUNTER — Telehealth: Payer: Self-pay | Admitting: Family Medicine

## 2015-01-22 DIAGNOSIS — M129 Arthropathy, unspecified: Secondary | ICD-10-CM

## 2015-01-22 NOTE — Telephone Encounter (Signed)
Last filled 12/02/14, last seen 08/13/14.

## 2015-01-23 MED ORDER — HYDROCODONE-ACETAMINOPHEN 7.5-325 MG PO TABS: 1.0000 | ORAL_TABLET | ORAL | Status: DC | PRN

## 2015-01-23 NOTE — Telephone Encounter (Signed)
Patient aware that rx is ready to be picked up.  

## 2015-02-10 ENCOUNTER — Other Ambulatory Visit: Payer: Self-pay

## 2015-02-10 MED ORDER — AMLODIPINE BESYLATE 5 MG PO TABS: ORAL_TABLET | ORAL | Status: DC

## 2015-02-10 MED ORDER — SIMVASTATIN 40 MG PO TABS: 40.0000 mg | ORAL_TABLET | Freq: Every day | ORAL | Status: DC

## 2015-02-24 ENCOUNTER — Telehealth: Payer: Self-pay | Admitting: Family Medicine

## 2015-02-24 DIAGNOSIS — M129 Arthropathy, unspecified: Secondary | ICD-10-CM

## 2015-02-24 MED ORDER — HYDROCODONE-ACETAMINOPHEN 7.5-325 MG PO TABS: 1.0000 | ORAL_TABLET | ORAL | Status: DC | PRN

## 2015-02-24 NOTE — Telephone Encounter (Signed)
Rx filled per patient request 

## 2015-02-25 ENCOUNTER — Telehealth: Payer: Self-pay

## 2015-02-25 NOTE — Telephone Encounter (Signed)
Pt aware rx ready to be picked up, rx placed up front

## 2015-03-19 ENCOUNTER — Other Ambulatory Visit: Payer: Self-pay | Admitting: Family Medicine

## 2015-03-19 NOTE — Telephone Encounter (Signed)
Last seen 08/13/14  Dr Sabra Heck

## 2015-03-24 ENCOUNTER — Other Ambulatory Visit: Payer: Self-pay | Admitting: *Deleted

## 2015-03-24 MED ORDER — LISINOPRIL-HYDROCHLOROTHIAZIDE 20-25 MG PO TABS: 1.0000 | ORAL_TABLET | Freq: Every day | ORAL | Status: DC

## 2015-04-14 ENCOUNTER — Other Ambulatory Visit: Payer: Self-pay | Admitting: Family Medicine

## 2015-04-14 DIAGNOSIS — M129 Arthropathy, unspecified: Secondary | ICD-10-CM

## 2015-04-16 ENCOUNTER — Telehealth: Payer: Self-pay | Admitting: Family Medicine

## 2015-04-16 DIAGNOSIS — M129 Arthropathy, unspecified: Secondary | ICD-10-CM

## 2015-04-16 MED ORDER — HYDROCODONE-ACETAMINOPHEN 7.5-325 MG PO TABS: 1.0000 | ORAL_TABLET | ORAL | Status: DC | PRN

## 2015-04-16 NOTE — Telephone Encounter (Signed)
Please review and advise.

## 2015-04-16 NOTE — Telephone Encounter (Signed)
Patient aware script is ready for pick up 

## 2015-04-22 ENCOUNTER — Other Ambulatory Visit: Payer: Self-pay | Admitting: Family Medicine

## 2015-04-22 NOTE — Telephone Encounter (Signed)
Last seen 08/22/08  Dr Sabra Heck

## 2015-05-26 ENCOUNTER — Other Ambulatory Visit: Payer: Self-pay | Admitting: Family Medicine

## 2015-05-26 ENCOUNTER — Telehealth: Payer: Self-pay | Admitting: Family Medicine

## 2015-05-26 DIAGNOSIS — M129 Arthropathy, unspecified: Secondary | ICD-10-CM

## 2015-05-26 NOTE — Telephone Encounter (Signed)
LM - does he have enough until Thursday ??  If not - [facility] can write enough until Thursday

## 2015-05-26 NOTE — Telephone Encounter (Signed)
Look at this and see what you can make of it?  Let me know

## 2015-05-26 NOTE — Telephone Encounter (Signed)
Yes, he has enough. But needs on Thursday

## 2015-05-27 ENCOUNTER — Other Ambulatory Visit: Payer: Self-pay | Admitting: Family Medicine

## 2015-05-27 NOTE — Telephone Encounter (Signed)
Last seen 08/13/14  Dr Sabra Heck

## 2015-05-27 NOTE — Telephone Encounter (Signed)
last seen 08/13/14 DR Sabra Heck  Last thyroid 05/13/14

## 2015-05-28 ENCOUNTER — Other Ambulatory Visit: Payer: Self-pay | Admitting: Family Medicine

## 2015-05-28 DIAGNOSIS — M129 Arthropathy, unspecified: Secondary | ICD-10-CM

## 2015-05-28 MED ORDER — HYDROCODONE-ACETAMINOPHEN 7.5-325 MG PO TABS: 1.0000 | ORAL_TABLET | ORAL | Status: DC | PRN

## 2015-05-28 NOTE — Telephone Encounter (Signed)
Patient aware that rx is ready to be picked up.  

## 2015-07-02 ENCOUNTER — Other Ambulatory Visit: Payer: Self-pay | Admitting: Family Medicine

## 2015-07-06 ENCOUNTER — Ambulatory Visit (INDEPENDENT_AMBULATORY_CARE_PROVIDER_SITE_OTHER): Payer: PPO | Admitting: Family Medicine

## 2015-07-06 ENCOUNTER — Encounter: Payer: Self-pay | Admitting: Family Medicine

## 2015-07-06 VITALS — BP 114/78 | HR 77 | Temp 97.4°F | Ht 72.0 in | Wt 242.4 lb

## 2015-07-06 DIAGNOSIS — E079 Disorder of thyroid, unspecified: Secondary | ICD-10-CM | POA: Diagnosis not present

## 2015-07-06 DIAGNOSIS — E785 Hyperlipidemia, unspecified: Secondary | ICD-10-CM | POA: Diagnosis not present

## 2015-07-06 DIAGNOSIS — M129 Arthropathy, unspecified: Secondary | ICD-10-CM

## 2015-07-06 MED ORDER — HYDROCODONE-ACETAMINOPHEN 7.5-325 MG PO TABS: 1.0000 | ORAL_TABLET | ORAL | Status: DC | PRN

## 2015-07-06 NOTE — Progress Notes (Signed)
Subjective:    Patient ID: [name], male    DOB: 1945/06/04, 70 y.o.   MRN: [id number]  HPI Patient here today for 1 year follow up on Hypertension, coronary artery disease He denies any new symptoms that does complain of arthralgias from his fingers to his back hips and knees. He takes Norco for that but does not abuse medication. He tested low for testosterone previously but states he cannot afford that testosterone replacement therapy. Lipids and liver were last checked greater than one year ago.        Patient Active Problem List   Diagnosis Date Noted  . Hyperlipidemia 10/29/2013  . Dyspnea 09/02/2013  . Dysphagia, unspecified(787.20) 04/08/2013  . Colon adenoma 04/08/2013  . Need for diphtheria-tetanus-pertussis (Tdap) vaccine, adult/adolescent 04/04/2013  . Chest pain with moderate risk of acute coronary syndrome- cath [facility] 01/15/13 01/15/2013  . Need for prophylactic vaccination against Streptococcus pneumoniae (pneumococcus) 12/24/2012  . Thyroid disease   . CAD (coronary artery disease)   . Bilateral lower extremity edema 09/06/2011  . GERD 09/18/2008  . HYPERLIPIDEMIA 05/01/2007  . Essential hypertension 05/01/2007  . Arthritis of knee 05/01/2007  . COPD (chronic obstructive pulmonary disease) ([place]) 05/01/2007  . GI BLEED 05/01/2007  . Arthropathy 05/01/2007  . SLEEP APNEA 05/01/2007   Outpatient Encounter Prescriptions as of 07/06/2015  Medication Sig  . albuterol (PROAIR HFA) 108 (90 BASE) MCG/ACT inhaler Inhale 2 puffs into the lungs every 6 (six) hours as needed for wheezing or shortness of breath.  Marland Kitchen amLODipine (NORVASC) 5 MG tablet TAKE 1 TABLET (5 MG TOTAL) BY MOUTH DAILY.  Marland Kitchen aspirin 325 MG tablet Take 325 mg by mouth daily.    Marland Kitchen atenolol (TENORMIN) 50 MG tablet TAKE ONE TABLET BY MOUTH ONE TIME DAILY  . furosemide (LASIX) 20 MG tablet Take 1 tablet (20 mg total) by mouth daily. Prn  . HYDROcodone-acetaminophen (NORCO) 7.5-325 MG tablet Take 1 tablet  by mouth every 4 (four) hours as needed for moderate pain. 4 severe pain in right arm.  . levothyroxine (SYNTHROID, LEVOTHROID) 175 MCG tablet TAKE 1 TABLET ONCE A DAY  . lisinopril-hydrochlorothiazide (PRINZIDE,ZESTORETIC) 20-25 MG tablet TAKE 1 TABLET DAILY  . nitroGLYCERIN (NITROSTAT) 0.4 MG SL tablet Place 1 tablet (0.4 mg total) under the tongue every 5 (five) minutes as needed for chest pain.  . pantoprazole (PROTONIX) 40 MG tablet TAKE ONE TABLET BY MOUTH ONE TIME DAILY  . sildenafil (REVATIO) 20 MG tablet Take 1-2 tablets as needed  . simvastatin (ZOCOR) 40 MG tablet TAKE ONE TABLET AT BEDTIME  . Testosterone (ANDROGEL) 20.25 MG/1.25GM (1.62%) GEL Place 2 application onto the skin daily.  . Tiotropium Bromide-Olodaterol (STIOLTO RESPIMAT) 2.5-2.5 MCG/ACT AERS Inhale 2 puffs into the lungs daily.   No facility-administered encounter medications on file as of 07/06/2015.     Review of Systems  Constitutional: Negative.   HENT: Negative.   Eyes: Negative.   Respiratory: Negative.   Cardiovascular: Negative.   Gastrointestinal: Negative.   Endocrine: Negative.   Genitourinary: Negative.   Musculoskeletal: Negative.   Skin: Negative.   Allergic/Immunologic: Negative.   Neurological: Negative.   Hematological: Negative.   Psychiatric/Behavioral: Negative.        Objective:   Physical Exam  Constitutional: He is oriented to person, place, and time. He appears well-developed and well-nourished.  HENT:  Mouth/Throat: Oropharynx is clear and moist.  Eyes: Pupils are equal, round, and reactive to light.  Cardiovascular: Normal rate, regular rhythm and intact distal  pulses.   Pulmonary/Chest: Effort normal and breath sounds normal.  Abdominal: Soft.  Neurological: He is alert and oriented to person, place, and time.  Psychiatric: He has a normal mood and affect. His behavior is normal.    Temp(Src) 97.4 F (36.3 C) (Oral)  Ht 6' (1.829 m)  Wt 242 lb 6.4 oz (109.952 kg)  BMI  32.87 kg/m2         Assessment & Plan:  1. Thyroid disease Last TSH greater than one year ago. Need to reassess - CMP14+EGFR - TSH  2. Hyperlipidemia Lipids were at goal when last checked 1 year ago - CMP14+EGFR - Lipid panel  3. Arthropathy Hydrocodone manages pain. He takes medicine as needed - HYDROcodone-acetaminophen (NORCO) 7.5-325 MG tablet; Take 1 tablet by mouth every 4 (four) hours as needed for moderate pain. 4 severe pain in right arm.  Dispense: 120 tablet; Refill: 0  Wardell Honour MD

## 2015-07-07 LAB — LIPID PANEL
CHOL/HDL RATIO: 3.6 ratio (ref 0.0–5.0)
CHOLESTEROL TOTAL: 172 mg/dL (ref 100–199)
HDL: 48 mg/dL (ref >39)
LDL CALC: 63 mg/dL (ref 0–99)
Triglycerides: 304 mg/dL — ABNORMAL HIGH (ref 0–149)
VLDL CHOLESTEROL CAL: 61 mg/dL — AB (ref 5–40)

## 2015-07-07 LAB — CMP14+EGFR
ALBUMIN: 3.9 g/dL (ref 3.5–4.8)
ALT: 9 IU/L (ref 0–44)
AST: 14 IU/L (ref 0–40)
Albumin/Globulin Ratio: 1.7 (ref 1.2–2.2)
Alkaline Phosphatase: 57 IU/L (ref 39–117)
BUN / CREAT RATIO: 13 (ref 10–24)
BUN: 14 mg/dL (ref 8–27)
Bilirubin Total: 0.4 mg/dL (ref 0.0–1.2)
CALCIUM: 9.2 mg/dL (ref 8.6–10.2)
CO2: 23 mmol/L (ref 18–29)
CREATININE: 1.11 mg/dL (ref 0.76–1.27)
Chloride: 101 mmol/L (ref 96–106)
GFR, EST AFRICAN AMERICAN: 77 mL/min/{1.73_m2} (ref >59)
GFR, EST NON AFRICAN AMERICAN: 67 mL/min/{1.73_m2} (ref >59)
GLOBULIN, TOTAL: 2.3 g/dL (ref 1.5–4.5)
GLUCOSE: 105 mg/dL — AB (ref 65–99)
Potassium: 4.1 mmol/L (ref 3.5–5.2)
SODIUM: 142 mmol/L (ref 134–144)
TOTAL PROTEIN: 6.2 g/dL (ref 6.0–8.5)

## 2015-07-07 LAB — TSH: TSH: 2 u[IU]/mL (ref 0.450–4.500)

## 2015-07-31 ENCOUNTER — Other Ambulatory Visit (INDEPENDENT_AMBULATORY_CARE_PROVIDER_SITE_OTHER): Payer: Self-pay | Admitting: Internal Medicine

## 2015-07-31 ENCOUNTER — Other Ambulatory Visit: Payer: Self-pay | Admitting: Family Medicine

## 2015-08-04 ENCOUNTER — Other Ambulatory Visit: Payer: Self-pay | Admitting: Family Medicine

## 2015-08-06 ENCOUNTER — Telehealth: Payer: Self-pay | Admitting: Family Medicine

## 2015-08-06 DIAGNOSIS — M129 Arthropathy, unspecified: Secondary | ICD-10-CM

## 2015-08-06 MED ORDER — HYDROCODONE-ACETAMINOPHEN 7.5-325 MG PO TABS: 1.0000 | ORAL_TABLET | ORAL | 0 refills | Status: DC | PRN

## 2015-08-06 NOTE — Telephone Encounter (Signed)
Last seen and filled 07/06/15.

## 2015-08-20 NOTE — Progress Notes (Signed)
Subjective:    Patient ID: [name], male    DOB: January 20, 1945, 70 y.o.   MRN: [id number]  HPI three-day history of laryngitis cough congestion. Denies fever or chills. Patient has been using his albuterol inhaler which seems to help some. He does complain of some postnasal drainage as well.  Patient Active Problem List   Diagnosis Date Noted  . Hyperlipidemia 10/29/2013  . Dyspnea 09/02/2013  . Dysphagia, unspecified(787.20) 04/08/2013  . Colon adenoma 04/08/2013  . Need for diphtheria-tetanus-pertussis (Tdap) vaccine, adult/adolescent 04/04/2013  . Chest pain with moderate risk of acute coronary syndrome- cath [facility] 01/15/13 01/15/2013  . Need for prophylactic vaccination against Streptococcus pneumoniae (pneumococcus) 12/24/2012  . Thyroid disease   . CAD (coronary artery disease)   . Bilateral lower extremity edema 09/06/2011  . GERD 09/18/2008  . HYPERLIPIDEMIA 05/01/2007  . Essential hypertension 05/01/2007  . Arthritis of knee 05/01/2007  . COPD (chronic obstructive pulmonary disease) ([place]) 05/01/2007  . GI BLEED 05/01/2007  . Arthropathy 05/01/2007  . SLEEP APNEA 05/01/2007   Outpatient Encounter Prescriptions as of 08/21/2015  Medication Sig  . albuterol (PROAIR HFA) 108 (90 BASE) MCG/ACT inhaler Inhale 2 puffs into the lungs every 6 (six) hours as needed for wheezing or shortness of breath.  Marland Kitchen amLODipine (NORVASC) 5 MG tablet TAKE 1 TABLET (5 MG TOTAL) BY MOUTH DAILY.  Marland Kitchen aspirin 325 MG tablet Take 325 mg by mouth daily.    Marland Kitchen atenolol (TENORMIN) 50 MG tablet TAKE ONE TABLET BY MOUTH ONE TIME DAILY  . furosemide (LASIX) 20 MG tablet Take 1 tablet (20 mg total) by mouth daily. Prn  . HYDROcodone-acetaminophen (NORCO) 7.5-325 MG tablet Take 1 tablet by mouth every 4 (four) hours as needed for moderate pain. 4 severe pain in right arm.  . levothyroxine (SYNTHROID, LEVOTHROID) 175 MCG tablet TAKE 1 TABLET ONCE A DAY  . lisinopril-hydrochlorothiazide (PRINZIDE,ZESTORETIC)  20-25 MG tablet TAKE 1 TABLET DAILY  . nitroGLYCERIN (NITROSTAT) 0.4 MG SL tablet Place 1 tablet (0.4 mg total) under the tongue every 5 (five) minutes as needed for chest pain.  . pantoprazole (PROTONIX) 40 MG tablet TAKE 1 TABLET ONCE A DAY  . sildenafil (REVATIO) 20 MG tablet Take 1-2 tablets as needed  . simvastatin (ZOCOR) 40 MG tablet TAKE ONE TABLET AT BEDTIME  . Testosterone (ANDROGEL) 20.25 MG/1.25GM (1.62%) GEL Place 2 application onto the skin daily.  . Tiotropium Bromide-Olodaterol (STIOLTO RESPIMAT) 2.5-2.5 MCG/ACT AERS Inhale 2 puffs into the lungs daily.   No facility-administered encounter medications on file as of 08/21/2015.       Review of Systems  Constitutional: Negative.   HENT: Positive for postnasal drip and sore throat.   Respiratory: Positive for cough.   Cardiovascular: Negative.        Objective:   Physical Exam  Constitutional: He is oriented to person, place, and time. He appears well-developed and well-nourished.  HENT:  Mouth/Throat: Oropharynx is clear and moist.  Cardiovascular: Normal rate and regular rhythm.   Pulmonary/Chest: Effort normal. He has wheezes.  Neurological: He is alert and oriented to person, place, and time.   BP 117/71 (BP Location: Right Arm, Patient Position: Sitting, Cuff Size: Large)   Pulse 72   Temp 97.2 F (36.2 C) (Oral)   Ht 6' (1.829 m)   Wt 239 lb (108.4 kg)   BMI 32.41 kg/m         Assessment & Plan:  1. Laryngotracheobronchitis General measures discussed including increased hydration with fluids and  hot showers rest voice continue albuterol inhaler. This likely is a viral infection but since he is having wheezing and productive cough will give him a Z-Pak as well.  Wardell Honour MD

## 2015-08-21 ENCOUNTER — Encounter: Payer: Self-pay | Admitting: Family Medicine

## 2015-08-21 ENCOUNTER — Ambulatory Visit (INDEPENDENT_AMBULATORY_CARE_PROVIDER_SITE_OTHER): Payer: PPO | Admitting: Family Medicine

## 2015-08-21 VITALS — BP 117/71 | HR 72 | Temp 97.2°F | Ht 72.0 in | Wt 239.0 lb

## 2015-08-21 DIAGNOSIS — J40 Bronchitis, not specified as acute or chronic: Secondary | ICD-10-CM | POA: Diagnosis not present

## 2015-08-21 MED ORDER — AZITHROMYCIN 250 MG PO TABS: ORAL_TABLET | ORAL | 0 refills | Status: DC

## 2015-08-21 NOTE — Addendum Note (Signed)
Addended by: Jamelle Haring on: 08/21/2015 04:14 PM   Modules accepted: Orders

## 2015-09-01 ENCOUNTER — Other Ambulatory Visit: Payer: Self-pay | Admitting: Family Medicine

## 2015-09-01 DIAGNOSIS — M129 Arthropathy, unspecified: Secondary | ICD-10-CM

## 2015-09-01 NOTE — Telephone Encounter (Signed)
Sabra Heck here on Thursday = lm to see if pt can wait until Thursday to fill.

## 2015-09-01 NOTE — Telephone Encounter (Signed)
Last filled 08/06/15

## 2015-09-04 ENCOUNTER — Telehealth: Payer: Self-pay | Admitting: Family Medicine

## 2015-09-04 MED ORDER — HYDROCODONE-ACETAMINOPHEN 7.5-325 MG PO TABS: 1.0000 | ORAL_TABLET | ORAL | 0 refills | Status: DC | PRN

## 2015-09-04 NOTE — Telephone Encounter (Signed)
Pt aware written Rx is at front desk ready for pickup  

## 2015-09-28 ENCOUNTER — Other Ambulatory Visit: Payer: Self-pay

## 2015-09-28 ENCOUNTER — Telehealth: Payer: Self-pay | Admitting: Family Medicine

## 2015-09-28 DIAGNOSIS — M129 Arthropathy, unspecified: Secondary | ICD-10-CM

## 2015-10-02 ENCOUNTER — Other Ambulatory Visit: Payer: Self-pay | Admitting: *Deleted

## 2015-10-02 DIAGNOSIS — M129 Arthropathy, unspecified: Secondary | ICD-10-CM

## 2015-10-02 MED ORDER — HYDROCODONE-ACETAMINOPHEN 7.5-325 MG PO TABS: 1.0000 | ORAL_TABLET | ORAL | 0 refills | Status: DC | PRN

## 2015-10-03 ENCOUNTER — Other Ambulatory Visit: Payer: Self-pay | Admitting: Family Medicine

## 2015-10-26 ENCOUNTER — Telehealth: Payer: Self-pay | Admitting: Family Medicine

## 2015-10-26 DIAGNOSIS — M129 Arthropathy, unspecified: Secondary | ICD-10-CM

## 2015-10-26 MED ORDER — HYDROCODONE-ACETAMINOPHEN 7.5-325 MG PO TABS: 1.0000 | ORAL_TABLET | ORAL | 0 refills | Status: DC | PRN

## 2015-10-26 NOTE — Telephone Encounter (Signed)
Pt rx printed and miller to sign later this week. - pt aware he can pick up on Thursday

## 2015-11-27 ENCOUNTER — Other Ambulatory Visit: Payer: Self-pay | Admitting: Family Medicine

## 2015-11-27 NOTE — Telephone Encounter (Signed)
Called and informed patient per office policy patient is to be seen for pain medication refill.  Patient states that he usually just calls the office to have medication refill.  Informed patient that Dr. Sabra Heck was out of the office and the office policy again.  Patient the proceeds to hang up the phone.

## 2015-11-30 ENCOUNTER — Telehealth: Payer: Self-pay | Admitting: Family Medicine

## 2015-11-30 DIAGNOSIS — M129 Arthropathy, unspecified: Secondary | ICD-10-CM

## 2015-12-01 ENCOUNTER — Other Ambulatory Visit: Payer: Self-pay | Admitting: Family Medicine

## 2015-12-01 MED ORDER — HYDROCODONE-ACETAMINOPHEN 7.5-325 MG PO TABS: 1.0000 | ORAL_TABLET | ORAL | 0 refills | Status: DC | PRN

## 2015-12-01 NOTE — Telephone Encounter (Signed)
Refilled per patient request. Will come in for appointment next week

## 2015-12-01 NOTE — Telephone Encounter (Signed)
Patient notified prescription will be at front desk for pick up.  [name] to place at front.

## 2015-12-25 ENCOUNTER — Ambulatory Visit (INDEPENDENT_AMBULATORY_CARE_PROVIDER_SITE_OTHER): Payer: PPO

## 2015-12-25 ENCOUNTER — Ambulatory Visit (INDEPENDENT_AMBULATORY_CARE_PROVIDER_SITE_OTHER): Payer: PPO | Admitting: Family Medicine

## 2015-12-25 ENCOUNTER — Encounter: Payer: Self-pay | Admitting: Family Medicine

## 2015-12-25 VITALS — BP 110/71 | HR 85 | Temp 97.5°F | Ht 72.0 in | Wt 232.8 lb

## 2015-12-25 DIAGNOSIS — I10 Essential (primary) hypertension: Secondary | ICD-10-CM | POA: Diagnosis not present

## 2015-12-25 DIAGNOSIS — R0781 Pleurodynia: Secondary | ICD-10-CM | POA: Diagnosis not present

## 2015-12-25 DIAGNOSIS — M129 Arthropathy, unspecified: Secondary | ICD-10-CM | POA: Diagnosis not present

## 2015-12-25 MED ORDER — HYDROCODONE-ACETAMINOPHEN 7.5-325 MG PO TABS: 1.0000 | ORAL_TABLET | ORAL | 0 refills | Status: DC | PRN

## 2015-12-25 NOTE — Patient Instructions (Signed)
Continue current medications. Continue good therapeutic lifestyle changes which include good diet and exercise. Fall precautions discussed with patient. If an FOBT was given today- please return it to our front desk. If you are over 71 years old - you may need Prevnar 22 or the adult Pneumonia vaccine.  Flu Shots will be available at our office starting mid- September. Please call and schedule a FLU CLINIC APPOINTMENT.

## 2015-12-25 NOTE — Progress Notes (Signed)
Subjective:    Patient ID: [name], male    DOB: [birth year], 70 y.o.   MRN: [id number]  HPI Patient is here today for a medication refill on his hydrocodone. Patient is also complaining with pain at his right ribs. He has not had any cough and there is no history of any trauma to the area. Otherwise hydrocodone as effectively managing his pain. Lipids were checked 5 months ago and were all at goal. He had previously been on testosterone for deficiency but insurance coverage was poor and he could not afford out of pocket.   Review of Systems  Constitutional: Negative.   HENT: Negative.   Eyes: Negative.   Respiratory:       Right rib pain    Cardiovascular: Negative.   Gastrointestinal: Negative.   Endocrine: Negative.   Genitourinary: Negative.   Musculoskeletal: Negative.   Skin: Negative.   Allergic/Immunologic: Negative.   Neurological: Negative.   Hematological: Negative.   Psychiatric/Behavioral: Negative.        Patient Active Problem List   Diagnosis Date Noted  . Hyperlipidemia 10/29/2013  . Dyspnea 09/02/2013  . Dysphagia, unspecified(787.20) 04/08/2013  . Colon adenoma 04/08/2013  . Need for diphtheria-tetanus-pertussis (Tdap) vaccine, adult/adolescent 04/04/2013  . Chest pain with moderate risk of acute coronary syndrome- cath [facility] 01/15/13 01/15/2013  . Need for prophylactic vaccination against Streptococcus pneumoniae (pneumococcus) 12/24/2012  . Thyroid disease   . CAD (coronary artery disease)   . Bilateral lower extremity edema 09/06/2011  . GERD 09/18/2008  . HYPERLIPIDEMIA 05/01/2007  . Essential hypertension 05/01/2007  . Arthritis of knee 05/01/2007  . COPD (chronic obstructive pulmonary disease) ([place]) 05/01/2007  . GI BLEED 05/01/2007  . Arthropathy 05/01/2007  . SLEEP APNEA 05/01/2007   Outpatient Encounter Prescriptions as of 12/25/2015  Medication Sig  . albuterol (PROAIR HFA) 108 (90 BASE) MCG/ACT inhaler Inhale 2 puffs into  the lungs every 6 (six) hours as needed for wheezing or shortness of breath.  Marland Kitchen amLODipine (NORVASC) 5 MG tablet TAKE 1 TABLET (5 MG TOTAL) BY MOUTH DAILY.  Marland Kitchen aspirin 325 MG tablet Take 325 mg by mouth daily.    Marland Kitchen atenolol (TENORMIN) 50 MG tablet TAKE ONE TABLET BY MOUTH ONE TIME DAILY  . furosemide (LASIX) 20 MG tablet Take 1 tablet (20 mg total) by mouth daily. Prn  . HYDROcodone-acetaminophen (NORCO) 7.5-325 MG tablet Take 1 tablet by mouth every 4 (four) hours as needed for moderate pain. 4 severe pain in right arm.  . levothyroxine (SYNTHROID, LEVOTHROID) 175 MCG tablet TAKE 1 TABLET ONCE A DAY  . lisinopril-hydrochlorothiazide (PRINZIDE,ZESTORETIC) 20-25 MG tablet TAKE 1 TABLET DAILY  . nitroGLYCERIN (NITROSTAT) 0.4 MG SL tablet Place 1 tablet (0.4 mg total) under the tongue every 5 (five) minutes as needed for chest pain.  . pantoprazole (PROTONIX) 40 MG tablet TAKE 1 TABLET ONCE A DAY  . sildenafil (REVATIO) 20 MG tablet Take 1-2 tablets as needed  . simvastatin (ZOCOR) 40 MG tablet TAKE ONE TABLET AT BEDTIME  . Testosterone (ANDROGEL) 20.25 MG/1.25GM (1.62%) GEL Place 2 application onto the skin daily.  . Tiotropium Bromide-Olodaterol (STIOLTO RESPIMAT) 2.5-2.5 MCG/ACT AERS Inhale 2 puffs into the lungs daily.  . [DISCONTINUED] azithromycin (ZITHROMAX) 250 MG tablet Take 2 tablets first day then one daily for 4 days   No facility-administered encounter medications on file as of 12/25/2015.        Objective:   Physical Exam  Constitutional: He appears well-developed and well-nourished.  Pulmonary/Chest: Effort  normal and breath sounds normal. He exhibits tenderness (there is tenderness in the right lateral rib area both point tenderness and general tenderness with compression. X-ray with marker showed no evidence of any disease in the bone/rib).   BP 110/71   Pulse 85   Temp 97.5 F (36.4 C) (Oral)   Ht 6' (1.829 m)   Wt 232 lb 12.8 oz (105.6 kg)   BMI 31.57 kg/m          Assessment & Plan:  1. Arthropathy Taking hydrocodone as prescribed - HYDROcodone-acetaminophen (NORCO) 7.5-325 MG tablet; Take 1 tablet by mouth every 4 (four) hours as needed for moderate pain. 4 severe pain in right arm.  Dispense: 120 tablet; Refill: 0  2. Rib pain on right side X-ray is negative for fracture or pathology. Treat with local heat or ice whichever feels better - DG Ribs Unilateral W/Chest Right; Future  3. Essential hypertension Blood pressure is well managed and 110/71. Continue lisinopril and hydrochlorothiazide and amlodipine and atenolol  Wardell Honour MD

## 2016-01-07 ENCOUNTER — Ambulatory Visit: Payer: PPO | Admitting: Family Medicine

## 2016-01-07 ENCOUNTER — Other Ambulatory Visit: Payer: Self-pay | Admitting: Family Medicine

## 2016-01-27 ENCOUNTER — Ambulatory Visit: Payer: PPO | Admitting: Family Medicine

## 2016-02-10 ENCOUNTER — Ambulatory Visit: Payer: PPO | Admitting: Family Medicine

## 2016-02-15 ENCOUNTER — Other Ambulatory Visit: Payer: Self-pay | Admitting: Family Medicine

## 2016-02-23 ENCOUNTER — Ambulatory Visit: Payer: PPO | Admitting: Family Medicine

## 2016-03-01 ENCOUNTER — Ambulatory Visit: Payer: PPO | Admitting: Family Medicine

## 2016-03-08 ENCOUNTER — Encounter: Payer: Self-pay | Admitting: Family Medicine

## 2016-03-08 ENCOUNTER — Ambulatory Visit (INDEPENDENT_AMBULATORY_CARE_PROVIDER_SITE_OTHER): Payer: PPO | Admitting: Family Medicine

## 2016-03-08 VITALS — BP 112/65 | HR 77 | Temp 97.5°F | Ht 73.83 in | Wt 227.8 lb

## 2016-03-08 DIAGNOSIS — Z0289 Encounter for other administrative examinations: Secondary | ICD-10-CM | POA: Diagnosis not present

## 2016-03-08 DIAGNOSIS — M129 Arthropathy, unspecified: Secondary | ICD-10-CM

## 2016-03-08 MED ORDER — HYDROCODONE-ACETAMINOPHEN 7.5-325 MG PO TABS: 1.0000 | ORAL_TABLET | ORAL | 0 refills | Status: DC | PRN

## 2016-03-08 NOTE — Patient Instructions (Signed)
Great to meet you!  Come back in 3 months unless you need us sooner.    

## 2016-03-08 NOTE — Progress Notes (Signed)
HPI  Patient presents today for initial pain management.  Patient has been taking hydrocodone documented as far back as 2013 in our EMR. He complains of several arthropathies including neck and back pain as well as left knee pain after surgery. The pain medication is effective than improves his level of function.  Patient denies any other drug use, depression, anxiety, or suicidal thoughts.  We have reviewed the pain medication contract in detail together and discussed it in detail. All of his questions were answered. He has provided a urine sample today.  Patient states that he takes 2-3 pills on most days, he does require 4 pills often.  PMH: Smoking status noted ROS: Per HPI  Objective: BP 112/65   Pulse 77   Temp 97.5 F (36.4 C) (Oral)   Ht 6' 1.83" (1.875 m)   Wt 227 lb 12.8 oz (103.3 kg)   BMI 29.38 kg/m  Gen: NAD, alert, cooperative with exam HEENT: NCAT CV: RRR, good S1/S2, no murmur Resp: CTABL, no wheezes, non-labored Ext: No edema, warm Neuro: Alert and oriented, No gross deficits  [place] controlled substance database reviewed without red flags.  Overview updated in problem list: Indication for chronic opioid: multiple sites of arthritis Medication and dose: norco 5/325 # pills per month: 120 Last UDS date: 03/08/2016 Pain contract signed (Y/N): Y- 03/08/2016 Date narcotic database last reviewed (include red flags): 03/08/2016  Assessment and plan:  # Arthropathy, pain management contract signed Today pain management contract was reviewed in detail and signed. The [place] controlled substance database was reviewed without red flags. Urine drug screen was collected today Refills provided 3 months Follow-up 3 months unless needed sooner.    Orders Placed This Encounter  Procedures  . ToxASSURE Select 13 (MW), Urine    Meds ordered this encounter  Medications  . DISCONTD: HYDROcodone-acetaminophen (NORCO) 7.5-325 MG tablet    Sig:  Take 1 tablet by mouth every 4 (four) hours as needed for moderate pain. 4 severe pain in right arm.    Dispense:  120 tablet    Refill:  0  . DISCONTD: HYDROcodone-acetaminophen (NORCO) 7.5-325 MG tablet    Sig: Take 1 tablet by mouth every 4 (four) hours as needed for moderate pain.    Dispense:  120 tablet    Refill:  0    Please do not fill until 04/24/2016  . DISCONTD: HYDROcodone-acetaminophen (NORCO) 7.5-325 MG tablet    Sig: Take 1 tablet by mouth every 4 (four) hours as needed for moderate pain.    Dispense:  120 tablet    Refill:  0    Please do not fill until 05/24/2016  . HYDROcodone-acetaminophen (NORCO) 7.5-325 MG tablet    Sig: Take 1 tablet by mouth every 4 (four) hours as needed for moderate pain. 4 severe pain in right arm.    Dispense:  120 tablet    Refill:  0    Please do not fill until 03/24/2016  . HYDROcodone-acetaminophen (NORCO) 7.5-325 MG tablet    Sig: Take 1 tablet by mouth every 4 (four) hours as needed for moderate pain.    Dispense:  120 tablet    Refill:  0    Please do not fill until 04/24/2016  . HYDROcodone-acetaminophen (NORCO) 7.5-325 MG tablet    Sig: Take 1 tablet by mouth every 4 (four) hours as needed for moderate pain.    Dispense:  120 tablet    Refill:  0    Please do not  fill until 05/24/2016    Laroy Apple, MD [facility] 03/08/2016, 12:12 PM

## 2016-03-09 ENCOUNTER — Ambulatory Visit: Payer: PPO | Admitting: Family Medicine

## 2016-03-12 LAB — TOXASSURE SELECT 13 (MW), URINE

## 2016-03-17 ENCOUNTER — Other Ambulatory Visit: Payer: Self-pay | Admitting: Family Medicine

## 2016-03-22 ENCOUNTER — Ambulatory Visit: Payer: PPO | Admitting: Family Medicine

## 2016-04-14 ENCOUNTER — Other Ambulatory Visit: Payer: Self-pay | Admitting: Family Medicine

## 2016-04-19 ENCOUNTER — Ambulatory Visit: Payer: PPO | Admitting: Family Medicine

## 2016-04-29 ENCOUNTER — Ambulatory Visit: Payer: PPO | Admitting: Family Medicine

## 2016-05-04 ENCOUNTER — Ambulatory Visit: Payer: PPO | Admitting: Family Medicine

## 2016-05-27 ENCOUNTER — Ambulatory Visit: Payer: PPO | Admitting: Family Medicine

## 2016-06-09 ENCOUNTER — Ambulatory Visit: Payer: PPO | Admitting: Family Medicine

## 2016-06-14 ENCOUNTER — Ambulatory Visit (INDEPENDENT_AMBULATORY_CARE_PROVIDER_SITE_OTHER): Payer: PPO | Admitting: Family Medicine

## 2016-06-14 ENCOUNTER — Encounter: Payer: Self-pay | Admitting: Family Medicine

## 2016-06-14 VITALS — BP 111/69 | HR 65 | Temp 97.4°F | Ht 73.83 in | Wt 221.6 lb

## 2016-06-14 DIAGNOSIS — E785 Hyperlipidemia, unspecified: Secondary | ICD-10-CM | POA: Diagnosis not present

## 2016-06-14 DIAGNOSIS — E079 Disorder of thyroid, unspecified: Secondary | ICD-10-CM | POA: Diagnosis not present

## 2016-06-14 DIAGNOSIS — M129 Arthropathy, unspecified: Secondary | ICD-10-CM | POA: Diagnosis not present

## 2016-06-14 DIAGNOSIS — I10 Essential (primary) hypertension: Secondary | ICD-10-CM | POA: Diagnosis not present

## 2016-06-14 MED ORDER — HYDROCODONE-ACETAMINOPHEN 10-325 MG PO TABS: 1.0000 | ORAL_TABLET | Freq: Four times a day (QID) | ORAL | 0 refills | Status: DC | PRN

## 2016-06-14 NOTE — Patient Instructions (Signed)
Great to see you!  We will call, send a letter, or send a mychart message with your labs within 1 week.   Consider adding cymbalta to help your pain.

## 2016-06-14 NOTE — Progress Notes (Signed)
   HPI  Patient presents today here to follow-up for chronic pain, hypertension, thyroid disease, hyperlipidemia.  Patient not really watching diet, however has good medication compliance. He takes Synthroid without any medications. His energy is good. He can tell the difference after taking Synthroid for a while.  Patient cannot afford AndroGel.  Patient's pain is less controlled than it has been over the last 6 months or so. Patient's been on narcotics for about 4 years. His multiple joint pains including knee and back. He tolerates hydrocodone without sedation   PMH: Smoking status noted ROS: Per HPI  Objective: BP 111/69   Pulse 65   Temp 97.4 F (36.3 C) (Oral)   Ht 6' 1.83" (1.875 m)   Wt 221 lb 9.6 oz (100.5 kg)   BMI 28.58 kg/m  Gen: NAD, alert, cooperative with exam HEENT: NCAT CV: RRR, good S1/S2, no murmur Resp: CTABL, no wheezes, non-labored Ext: No edema, warm Neuro: Alert and oriented, No gross deficits  Assessment and plan:  # Arthropathy, and pain Patient's pain not well in general, increase oxycodone to 10 mg up to 4 times a day Urine drug screen was reviewed from last visit, as expected [place] controlled substance database review with no red flags Consider cymbalta discussed  # Hyperlipidemia Repeat labs, continue Zocor Clinically stable  # Thyroid disease, hypothyroidism High-dose of Synthroid Repeat TSH, clinically stable  # Hypertension Well-controlled on multiple medications including Prinzide, atenolol, and amlodipine. Repeat labs Continue current medications. History of CAD    Orders Placed This Encounter  Procedures  . CBC with Differential/Platelet  . CMP14+EGFR  . Lipid panel  . TSH    Meds ordered this encounter  Medications  . HYDROcodone-acetaminophen (NORCO) 10-325 MG tablet    Sig: Take 1 tablet by mouth every 6 (six) hours as needed.    Dispense:  120 tablet    Refill:  0    Please do not fill until  06/24/2016  . HYDROcodone-acetaminophen (NORCO) 10-325 MG tablet    Sig: Take 1 tablet by mouth every 6 (six) hours as needed.    Dispense:  120 tablet    Refill:  0    Please do not fill until 07/24/2016  . HYDROcodone-acetaminophen (NORCO) 10-325 MG tablet    Sig: Take 1 tablet by mouth every 6 (six) hours as needed.    Dispense:  120 tablet    Refill:  0    Please do not fill until 08/24/2016    Laroy Apple, MD [facility] 06/14/2016, 8:59 AM

## 2016-06-15 LAB — CMP14+EGFR
ALT: 11 IU/L (ref 0–44)
AST: 14 IU/L (ref 0–40)
Albumin/Globulin Ratio: 1.7 (ref 1.2–2.2)
Albumin: 4.1 g/dL (ref 3.5–4.8)
Alkaline Phosphatase: 58 IU/L (ref 39–117)
BUN / CREAT RATIO: 14 (ref 10–24)
BUN: 14 mg/dL (ref 8–27)
Bilirubin Total: 0.4 mg/dL (ref 0.0–1.2)
CO2: 25 mmol/L (ref 20–29)
CREATININE: 0.99 mg/dL (ref 0.76–1.27)
Calcium: 9.8 mg/dL (ref 8.6–10.2)
Chloride: 100 mmol/L (ref 96–106)
GFR calc non Af Amer: 76 mL/min/{1.73_m2} (ref >59)
GFR, EST AFRICAN AMERICAN: 88 mL/min/{1.73_m2} (ref >59)
GLUCOSE: 104 mg/dL — AB (ref 65–99)
Globulin, Total: 2.4 g/dL (ref 1.5–4.5)
Potassium: 4.5 mmol/L (ref 3.5–5.2)
Sodium: 140 mmol/L (ref 134–144)
TOTAL PROTEIN: 6.5 g/dL (ref 6.0–8.5)

## 2016-06-15 LAB — CBC WITH DIFFERENTIAL/PLATELET
BASOS ABS: 0 10*3/uL (ref 0.0–0.2)
Basos: 0 %
EOS (ABSOLUTE): 0.1 10*3/uL (ref 0.0–0.4)
Eos: 1 %
HEMOGLOBIN: 15.6 g/dL (ref 13.0–17.7)
Hematocrit: 45.3 % (ref 37.5–51.0)
IMMATURE GRANS (ABS): 0 10*3/uL (ref 0.0–0.1)
Immature Granulocytes: 0 %
LYMPHS: 25 %
Lymphocytes Absolute: 2.2 10*3/uL (ref 0.7–3.1)
MCH: 31.1 pg (ref 26.6–33.0)
MCHC: 34.4 g/dL (ref 31.5–35.7)
MCV: 90 fL (ref 79–97)
MONOCYTES: 8 %
Monocytes Absolute: 0.8 10*3/uL (ref 0.1–0.9)
Neutrophils Absolute: 5.9 10*3/uL (ref 1.4–7.0)
Neutrophils: 66 %
Platelets: 243 10*3/uL (ref 150–379)
RBC: 5.02 x10E6/uL (ref 4.14–5.80)
RDW: 14.6 % (ref 12.3–15.4)
WBC: 9.1 10*3/uL (ref 3.4–10.8)

## 2016-06-15 LAB — LIPID PANEL
CHOLESTEROL TOTAL: 139 mg/dL (ref 100–199)
Chol/HDL Ratio: 2.5 ratio (ref 0.0–5.0)
HDL: 55 mg/dL (ref >39)
LDL CALC: 61 mg/dL (ref 0–99)
Triglycerides: 113 mg/dL (ref 0–149)
VLDL CHOLESTEROL CAL: 23 mg/dL (ref 5–40)

## 2016-06-15 LAB — TSH: TSH: 0.184 u[IU]/mL — AB (ref 0.450–4.500)

## 2016-06-16 ENCOUNTER — Other Ambulatory Visit: Payer: Self-pay | Admitting: Family Medicine

## 2016-06-16 MED ORDER — LEVOTHYROXINE SODIUM 150 MCG PO TABS: 150.0000 ug | ORAL_TABLET | Freq: Every day | ORAL | 3 refills | Status: DC

## 2016-06-22 ENCOUNTER — Other Ambulatory Visit: Payer: Self-pay | Admitting: Family Medicine

## 2016-06-24 ENCOUNTER — Ambulatory Visit: Payer: PPO | Admitting: Family Medicine

## 2016-07-07 ENCOUNTER — Ambulatory Visit: Payer: PPO | Admitting: Family Medicine

## 2016-07-20 ENCOUNTER — Other Ambulatory Visit: Payer: Self-pay | Admitting: Family Medicine

## 2016-07-22 ENCOUNTER — Ambulatory Visit: Payer: PPO | Admitting: Family Medicine

## 2016-08-04 ENCOUNTER — Ambulatory Visit: Payer: PPO | Admitting: Family Medicine

## 2016-08-24 ENCOUNTER — Ambulatory Visit: Payer: PPO | Admitting: Family Medicine

## 2016-09-07 ENCOUNTER — Ambulatory Visit: Payer: PPO | Admitting: Family Medicine

## 2016-09-16 ENCOUNTER — Ambulatory Visit (INDEPENDENT_AMBULATORY_CARE_PROVIDER_SITE_OTHER): Payer: PPO | Admitting: Family Medicine

## 2016-09-16 ENCOUNTER — Encounter: Payer: Self-pay | Admitting: Family Medicine

## 2016-09-16 VITALS — BP 112/67 | HR 56 | Temp 96.9°F | Ht 73.83 in | Wt 225.4 lb

## 2016-09-16 DIAGNOSIS — Z0289 Encounter for other administrative examinations: Secondary | ICD-10-CM

## 2016-09-16 DIAGNOSIS — E079 Disorder of thyroid, unspecified: Secondary | ICD-10-CM

## 2016-09-16 DIAGNOSIS — M129 Arthropathy, unspecified: Secondary | ICD-10-CM

## 2016-09-16 MED ORDER — HYDROCODONE-ACETAMINOPHEN 10-325 MG PO TABS: 1.0000 | ORAL_TABLET | Freq: Four times a day (QID) | ORAL | 0 refills | Status: DC | PRN

## 2016-09-16 NOTE — Progress Notes (Signed)
   HPI  Patient presents today here to follow-up for chronic pain management as well as hypothyroidism.  Thyroid disease Patient had medication adjusted downward last visit to 150 ucg Asymptomatic.  Chronic pain Well-controlled on new dose, no sedation, tolerating well.  PMH: Smoking status noted ROS: Per HPI  Objective: BP 112/67   Pulse (!) 56   Temp (!) 96.9 F (36.1 C) (Oral)   Ht 6' 1.83" (1.875 m)   Wt 225 lb 6.4 oz (102.2 kg)   BMI 29.07 kg/m  Gen: NAD, alert, cooperative with exam HEENT: NCAT CV: RRR, good S1/S2, no murmur Resp: CTABL, no wheezes, non-labored Ext: No edema, warm Neuro: Alert and oriented, No gross deficits  Assessment and plan:  # Thyroid disease, hypothyroidism Synthroid adjusted last visit, repeat TSH Asymptomatic  # Pain management contract signed, arthropathy, chronic musculoskeletal pain Refill hydrocodone 3 month Birth, controlled substance database reviewed with no red flags   Orders Placed This Encounter  Procedures  . TSH    Meds ordered this encounter  Medications  . HYDROcodone-acetaminophen (NORCO) 10-325 MG tablet    Sig: Take 1 tablet by mouth every 6 (six) hours as needed.    Dispense:  120 tablet    Refill:  0    Please do not fill until 09/23/2016  . HYDROcodone-acetaminophen (NORCO) 10-325 MG tablet    Sig: Take 1 tablet by mouth every 6 (six) hours as needed.    Dispense:  120 tablet    Refill:  0    Please do not fill until 10/23/2016  . HYDROcodone-acetaminophen (NORCO) 10-325 MG tablet    Sig: Take 1 tablet by mouth every 6 (six) hours as needed.    Dispense:  120 tablet    Refill:  0    Please do not fill until 11/22/2016    Laroy Apple, MD [facility] 09/16/2016, 8:34 AM

## 2016-09-16 NOTE — Patient Instructions (Signed)
Great to see you!  Lets follow up in 3 months for chronic pain

## 2016-09-17 LAB — TSH: TSH: 0.793 u[IU]/mL (ref 0.450–4.500)

## 2016-09-19 ENCOUNTER — Encounter: Payer: Self-pay | Admitting: Family Medicine

## 2016-09-30 ENCOUNTER — Ambulatory Visit: Payer: PPO | Admitting: Family Medicine

## 2016-10-05 ENCOUNTER — Ambulatory Visit: Payer: PPO | Admitting: Family Medicine

## 2016-10-28 ENCOUNTER — Ambulatory Visit: Payer: PPO | Admitting: Family Medicine

## 2016-11-10 ENCOUNTER — Ambulatory Visit: Payer: PPO | Admitting: Family Medicine

## 2016-11-25 ENCOUNTER — Ambulatory Visit: Payer: PPO | Admitting: Family Medicine

## 2016-12-07 ENCOUNTER — Ambulatory Visit (INDEPENDENT_AMBULATORY_CARE_PROVIDER_SITE_OTHER): Payer: PPO | Admitting: *Deleted

## 2016-12-07 VITALS — BP 102/62 | HR 76 | Ht 70.0 in | Wt 228.0 lb

## 2016-12-07 DIAGNOSIS — Z0000 Encounter for general adult medical examination without abnormal findings: Secondary | ICD-10-CM | POA: Diagnosis not present

## 2016-12-07 NOTE — Progress Notes (Addendum)
Subjective:   [name] is a 71 y.o. male who presents for Medicare Annual/Subsequent preventive examination.  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  [name] lives with his wife of 39 years, and 32 year old grandson.  They have 2 children and 3 grandchildren.  He is the primary care giver for his wife who has several medical conditions, and cannot stay unattended for long periods of time. He attends church 3 times per week.  [name] feels his health is the same as last year.  He denies any hospitalizations, ED visits, or surgeries in the past year.    Review of Systems:  Back pain  Bilateral hand pain  All other systems negative       Objective:    Vitals: BP 102/62   Pulse 76   Ht 5\' 10"  (1.778 m)   Wt 228 lb (103.4 kg)   BMI 32.71 kg/m   Body mass index is 32.71 kg/m.  Advanced Directives 12/07/2016 05/15/2014 05/02/2013 01/15/2013  Does Patient Have a Medical Advance Directive? No No Patient does not have advance directive;Patient would not like information Patient does not have advance directive  Would patient like information on creating a medical advance directive? No - Patient declined No - patient declined information - -  Pre-existing out of facility DNR order (yellow form or pink MOST form) - - No -    Tobacco Social History   Tobacco Use  Smoking Status Former Smoker  . Packs/day: 3.00  . Years: 20.00  . Pack years: 60.00  . Types: Cigarettes  . Last attempt to quit: 05/26/1975  . Years since quitting: 41.5  Smokeless Tobacco Former Systems developer  . Types: Chew  . Quit date: 01/04/2000     Counseling given: Yes   Clinical Intake:                                Past Medical History:  Diagnosis Date  . Anginal pain ([place])   . Arthritis    OSTEO BACK KNEES HIPS & HANDS  . Arthropathy, unspecified, site unspecified   . Chronic airway obstruction, not elsewhere classified   . Coronary  atherosclerosis of unspecified type of vessel, native or graft    a. LHC 5/16:  mLAD 20%, CFX stent ok, OM3 stent ok, mRCA 30%, normal LVF  . Headache(784.0)   . Hx of echocardiogram    a. Echo 5/16:  mild LVH, EF 60%, mildly dilated Ao root (40 mm)  . Hypothyroidism   . Other and unspecified hyperlipidemia   . Thyroid disease    hypothyroid  . Unspecified essential hypertension   . Unspecified sleep apnea    [facility] Jacelyn Grip at [facility] told pt he had sleep apnea, but never sent him for a sleep study so he doesn't wear a CPAP   Past Surgical History:  Procedure Laterality Date  . ANKLE SURGERY    . BALLOON DILATION N/A 05/02/2013   Procedure: BALLOON DILATION;  Surgeon: Rogene Houston, MD;  [facility];  Service: Endoscopy;  Laterality: N/A;  . CARDIAC CATHETERIZATION  01/15/2013  . CARDIAC CATHETERIZATION N/A 05/16/2014   Procedure: Left Heart Cath and Coronary Angiography;  Surgeon: Peter M Martinique, MD;  Location: [place] [facility];  Service: Cardiovascular;  Laterality: N/A;  . COLONOSCOPY N/A 05/02/2013   Procedure: COLONOSCOPY;  Surgeon: Rogene Houston, MD;  [facility];  Service: Endoscopy;  Laterality: N/A;  200-moved to 1200 Ann notified pt  . CORONARY STENT PLACEMENT     2003, 2004  . ESOPHAGOGASTRODUODENOSCOPY N/A 05/02/2013   Procedure: ESOPHAGOGASTRODUODENOSCOPY (EGD);  Surgeon: Rogene Houston, MD;  [facility];  Service: Endoscopy;  Laterality: N/A;  . HEMORRHOID SURGERY    . HIP ARTHROPLASTY    . KNEE SURGERY Left 12/2013  . LEFT HEART CATHETERIZATION WITH CORONARY ANGIOGRAM N/A 01/15/2013   Procedure: LEFT HEART CATHETERIZATION WITH CORONARY ANGIOGRAM;  Surgeon: Sinclair Grooms, MD;  Location: [facility];  Service: Cardiovascular;  Laterality: N/A;  . MALONEY DILATION N/A 05/02/2013   Procedure: Venia Minks DILATION;  Surgeon: Rogene Houston, MD;  [facility];  Service: Endoscopy;  Laterality: N/A;  . SAVORY DILATION N/A  05/02/2013   Procedure: SAVORY DILATION;  Surgeon: Rogene Houston, MD;  [facility];  Service: Endoscopy;  Laterality: N/A;  . TONSILLECTOMY     Family History  Problem Relation Age of Onset  . Stroke Mother   . CAD Father        MI at age 46  . Colon cancer Neg Hx    Social History   Socioeconomic History  . Marital status: Married    Spouse name: None  . Number of children: 2  . Years of education: None  . Highest education level: None  Social Needs  . Financial resource strain: Not hard at all  . Food insecurity - worry: Never true  . Food insecurity - inability: Never true  . Transportation needs - medical: No  . Transportation needs - non-medical: No  Occupational History  . 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 Smoking status: Former Smoker    Packs/day: 3.00    Years: 20.00    Pack years: 60.00    Types: Cigarettes    Last attempt to quit: 05/26/1975    Years since quitting: 41.5  . Smokeless tobacco: Former Systems developer    Types: [place] date: 01/04/2000  Substance and Sexual Activity  . Alcohol use: No  . Drug use: No  . Sexual activity: None  Other Topics Concern  . 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    Outpatient Encounter Medications as of 12/07/2016  Medication Sig  . albuterol (PROAIR HFA) 108 (90 BASE) MCG/ACT inhaler Inhale 2 puffs into the lungs every 6 (six) hours as needed for wheezing or shortness of breath.  Marland Kitchen amLODipine (NORVASC) 5 MG tablet TAKE 1 TABLET (5 MG TOTAL) BY MOUTH DAILY.  Marland Kitchen aspirin 325 MG tablet Take 325 mg by mouth daily.    Marland Kitchen atenolol (TENORMIN) 50 MG tablet TAKE ONE TABLET BY MOUTH ONE TIME DAILY  . furosemide (LASIX) 20 MG tablet Take 1 tablet (20 mg total) by mouth daily. Prn  . HYDROcodone-acetaminophen (NORCO) 10-325 MG tablet Take 1 tablet by mouth every 6 (six) hours as needed.  Marland Kitchen HYDROcodone-acetaminophen  (NORCO) 10-325 MG tablet Take 1 tablet by mouth every 6 (six) hours as needed.  Marland Kitchen HYDROcodone-acetaminophen (NORCO) 10-325 MG tablet Take 1 tablet by mouth every 6 (six) hours as needed.  Marland Kitchen levothyroxine (SYNTHROID, LEVOTHROID) 150 MCG tablet Take 1 tablet (150 mcg total) by mouth daily.  Marland Kitchen lisinopril-hydrochlorothiazide (PRINZIDE,ZESTORETIC) 20-25 MG tablet TAKE 1 TABLET DAILY  .  nitroGLYCERIN (NITROSTAT) 0.4 MG SL tablet Place 1 tablet (0.4 mg total) under the tongue every 5 (five) minutes as needed for chest pain.  . pantoprazole (PROTONIX) 40 MG tablet TAKE 1 TABLET ONCE A DAY  . sildenafil (REVATIO) 20 MG tablet Take 1-2 tablets as needed  . simvastatin (ZOCOR) 40 MG tablet TAKE ONE TABLET AT BEDTIME  . Testosterone (ANDROGEL) 20.25 MG/1.25GM (1.62%) GEL Place 2 application onto the skin daily.  . Tiotropium Bromide-Olodaterol (STIOLTO RESPIMAT) 2.5-2.5 MCG/ACT AERS Inhale 2 puffs into the lungs daily.   No facility-administered encounter medications on file as of 12/07/2016.     Activities of Daily Living In your present state of health, do you have any difficulty performing the following activities: 12/07/2016  Hearing? Y  Comment Has seen audiologist, recommended hearing aids planning to check on getting hearing aids when new insurance starts  Vision? N  Difficulty concentrating or making decisions? N  Walking or climbing stairs? N  Dressing or bathing? N  Doing errands, shopping? N  Preparing Food and eating ? N  Using the Toilet? N  In the past six months, have you accidently leaked urine? N  Do you have problems with loss of bowel control? N  Managing your Medications? N  Managing your Finances? N  Housekeeping or managing your Housekeeping? N  Some recent data might be hidden      Patient Care Team: Timmothy Euler, MD as PCP - General (Family Medicine)   Simonne Maffucci, MD Pulmonology Peter Martinique, MD Cardiology  Assessment:     Exercise Activities and  Dietary recommendations Current Exercise Habits: The patient has a physically strenous job, but has no regular exercise apart from work., Exercise limited by: Other - see comments(He cannot leave his wife for long periods of time as she is disabled and he is her caregiver)  Patient states he eats 3 meals per day, usually does not eat snacks. Recommended diet consisting mostly of vegetables, fruits, whole grains, and lean proteins   Goals    . DIET - INCREASE WATER INTAKE     Increase to at least 4-6 glasses per day.       Fall Risk Fall Risk  12/07/2016 09/16/2016 06/14/2016 03/08/2016 12/25/2015  Falls in the past year? No No No No No    Depression Screen PHQ 2/9 Scores 12/07/2016 09/16/2016 06/14/2016 03/08/2016  PHQ - 2 Score 0 0 0 0    Cognitive Function MMSE - Mini Mental State Exam 12/07/2016  Orientation to time 5  Orientation to Place 5  Registration 3  Attention/ Calculation 2  Recall 2  Language- name 2 objects 2  Language- repeat 1  Language- follow 3 step command 3  Language- read & follow direction 1  Write a sentence 1  Copy design 1  Total score 26        Immunization History  Administered Date(s) Administered  . Influenza Split 10/06/2012  . Influenza, High Dose Seasonal PF 09/14/2013  . Influenza,inj,Quad PF,6+ Mos 10/01/2014, 10/04/2016  . Influenza-Unspecified 10/07/2015  . Pneumococcal Conjugate-13 12/24/2012  . Tdap 04/04/2013   Screening Tests Health Maintenance  Topic Date Due  . Hepatitis C Screening  July 02, 1945  . PNA vac Low Risk Adult (2 of 2 - PPSV23) 12/24/2013  . COLONOSCOPY  05/03/2018  . TETANUS/TDAP  04/05/2023  . INFLUENZA VACCINE  Completed   Declined PPSV23 today Recommend Hep C screening at next office visit  Cancer Screenings: Lung: Low Dose CT Chest recommended if Age  55-80 years, 30 pack-year currently smoking OR have quit w/in 15years. Patient does qualify.  Colorectal: up to date      Plan:     Replace 2 sodas per  day with water- try to get at least 4-6 glasses of water per day.  Consider getting pneumovax at next visit with Dr. Wendi Snipes  When care giving schedule allows incorporate 30 minutes of walking or riding stationary bike   I have personally reviewed and noted the following in the patient's chart:   . Medical and social history . Use of alcohol, tobacco or illicit drugs  . Current medications and supplements . Functional ability and status . Nutritional status . Physical activity . Advanced directives . List of other physicians . Hospitalizations, surgeries, and ER visits in previous 12 months . Vitals . Screenings to include cognitive, depression, and falls . Referrals and appointments  In addition, I have reviewed and discussed with patient certain preventive protocols, quality metrics, and best practice recommendations. A written personalized care plan for preventive services as well as general preventive health recommendations were provided to patient.     Blayke Pinera M, RN  12/07/2016  I have reviewed and agree with the above AWV documentation.   Terald Sleeper PA-C [facility] [address]  [place],  [address] [phone]

## 2016-12-07 NOTE — Patient Instructions (Signed)
Work on replacing 2 sodas per day with water- try to get at least 4-6 glasses of water per day.   Consider getting your pneumonia shot at your next visit with Dr. Wendi Snipes   Thank you for coming in for your Annual Wellness Visit today!!   Preventive Care 15 Years and Older, Male Preventive care refers to lifestyle choices and visits with your health care provider that can promote health and wellness. What does preventive care include?  A yearly physical exam. This is also called an annual well check.  Dental exams once or twice a year.  Routine eye exams. Ask your health care provider how often you should have your eyes checked.  Personal lifestyle choices, including: ? Daily care of your teeth and gums. ? Regular physical activity. ? Eating a healthy diet. ? Avoiding tobacco and drug use. ? Limiting alcohol use. ? Practicing safe sex. ? Taking low doses of aspirin every day. ? Taking vitamin and mineral supplements as recommended by your health care provider. What happens during an annual well check? The services and screenings done by your health care provider during your annual well check will depend on your age, overall health, lifestyle risk factors, and family history of disease. Counseling Your health care provider may ask you questions about your:  Alcohol use.  Tobacco use.  Drug use.  Emotional well-being.  Home and relationship well-being.  Sexual activity.  Eating habits.  History of falls.  Memory and ability to understand (cognition).  Work and work Statistician.  Screening You may have the following tests or measurements:  Height, weight, and BMI.  Blood pressure.  Lipid and cholesterol levels. These may be checked every 5 years, or more frequently if you are over 73 years old.  Skin check.  Lung cancer screening. You may have this screening every year starting at age 49 if you have a 30-pack-year history of smoking and currently smoke or  have quit within the past 15 years.  Fecal occult blood test (FOBT) of the stool. You may have this test every year starting at age 12.  Flexible sigmoidoscopy or colonoscopy. You may have a sigmoidoscopy every 5 years or a colonoscopy every 10 years starting at age 29.  Prostate cancer screening. Recommendations will vary depending on your family history and other risks.  Hepatitis C blood test.  Hepatitis B blood test.  Sexually transmitted disease (STD) testing.  Diabetes screening. This is done by checking your blood sugar (glucose) after you have not eaten for a while (fasting). You may have this done every 1-3 years.  Abdominal aortic aneurysm (AAA) screening. You may need this if you are a current or former smoker.  Osteoporosis. You may be screened starting at age 67 if you are at high risk.  Talk with your health care provider about your test results, treatment options, and if necessary, the need for more tests. Vaccines Your health care provider may recommend certain vaccines, such as:  Influenza vaccine. This is recommended every year.  Tetanus, diphtheria, and acellular pertussis (Tdap, Td) vaccine. You may need a Td booster every 10 years.  Varicella vaccine. You may need this if you have not been vaccinated.  Zoster vaccine. You may need this after age 8.  Measles, mumps, and rubella (MMR) vaccine. You may need at least one dose of MMR if you were born in 1957 or later. You may also need a second dose.  Pneumococcal 13-valent conjugate (PCV13) vaccine. One dose is recommended after  age 83.  Pneumococcal polysaccharide (PPSV23) vaccine. One dose is recommended after age 21.  Meningococcal vaccine. You may need this if you have certain conditions.  Hepatitis A vaccine. You may need this if you have certain conditions or if you travel or work in places where you may be exposed to hepatitis A.  Hepatitis B vaccine. You may need this if you have certain conditions  or if you travel or work in places where you may be exposed to hepatitis B.  Haemophilus influenzae type b (Hib) vaccine. You may need this if you have certain risk factors.  Talk to your health care provider about which screenings and vaccines you need and how often you need them. This information is not intended to replace advice given to you by your health care provider. Make sure you discuss any questions you have with your health care provider. Document Released: 01/16/2015 Document Revised: 09/09/2015 Document Reviewed: 10/21/2014 Elsevier Interactive Patient Education  2017 [employer].

## 2016-12-20 ENCOUNTER — Other Ambulatory Visit: Payer: Self-pay | Admitting: Family Medicine

## 2016-12-20 ENCOUNTER — Encounter: Payer: Self-pay | Admitting: Family Medicine

## 2016-12-20 ENCOUNTER — Ambulatory Visit: Payer: PPO | Admitting: Family Medicine

## 2016-12-20 VITALS — BP 132/75 | HR 64 | Temp 97.0°F | Ht 70.0 in | Wt 228.0 lb

## 2016-12-20 DIAGNOSIS — R12 Heartburn: Secondary | ICD-10-CM

## 2016-12-20 DIAGNOSIS — R7989 Other specified abnormal findings of blood chemistry: Secondary | ICD-10-CM | POA: Diagnosis not present

## 2016-12-20 DIAGNOSIS — G8929 Other chronic pain: Secondary | ICD-10-CM | POA: Diagnosis not present

## 2016-12-20 DIAGNOSIS — I10 Essential (primary) hypertension: Secondary | ICD-10-CM

## 2016-12-20 MED ORDER — HYDROCODONE-ACETAMINOPHEN 10-325 MG PO TABS: 1.0000 | ORAL_TABLET | Freq: Four times a day (QID) | ORAL | 0 refills | Status: DC | PRN

## 2016-12-20 NOTE — Patient Instructions (Signed)
Great to see you!  Come back in 3 months unless you need us sooner.    

## 2016-12-20 NOTE — Progress Notes (Signed)
   HPI  Patient presents today here to follow-up for chronic pain and other chronic medical problems.  Chronic pain Bilateral knee pain and low back pain, doing well with hydrocodone 10 mg. Needs refill.  Hypertension Good medication compliance, no headache or chest pain. Blood pressures 130s at home.  Heartburn Patient takes PPI occasionally, has not taken in over a month.  Low testosterone. Patient request starting testosterone injections again. We discussed seeing endocrinology given his age. He does not listen specialist at this time  PMH: Smoking status noted ROS: Per HPI  Objective: BP 132/75   Pulse 64   Temp (!) 97 F (36.1 C) (Oral)   Ht 5\' 10"  (1.778 m)   Wt 228 lb (103.4 kg)   BMI 32.71 kg/m  Gen: NAD, alert, cooperative with exam HEENT: NCAT CV: RRR, good S1/S2, no murmur Resp: CTABL, no wheezes, non-labored Ext: No edema, warm Neuro: Alert and oriented, No gross deficits  Assessment and plan:  #Chronic pain management Patient has signed a pain contract. Refill hydrocodone times 3 months. Pain well controlled on current dose with no sedation or side effects  #Hypertension Well-controlled on current medications, no changes. Labs up-to-date  #Low testosterone Discussed unclear role of testosterone replacement at the age over 98. Offered endocrinology referral, he declines  #Heartburn Discussed H2 blocker may be faster than his PPI, no changes today Well-controlled overall      Meds ordered this encounter  Medications  . HYDROcodone-acetaminophen (NORCO) 10-325 MG tablet    Sig: Take 1 tablet by mouth every 6 (six) hours as needed.    Dispense:  120 tablet    Refill:  0  . HYDROcodone-acetaminophen (NORCO) 10-325 MG tablet    Sig: Take 1 tablet by mouth every 6 (six) hours as needed.    Dispense:  120 tablet    Refill:  0    Please do not fill until 30 days after date written  . HYDROcodone-acetaminophen (NORCO) 10-325 MG tablet   Sig: Take 1 tablet by mouth every 6 (six) hours as needed.    Dispense:  120 tablet    Refill:  0    Please do not fill until 60 days after date written    Laroy Apple, MD [facility] 12/20/2016, 8:10 AM

## 2016-12-23 ENCOUNTER — Ambulatory Visit: Payer: PPO | Admitting: Family Medicine

## 2017-01-20 ENCOUNTER — Other Ambulatory Visit: Payer: Self-pay | Admitting: Family Medicine

## 2017-02-02 DIAGNOSIS — H903 Sensorineural hearing loss, bilateral: Secondary | ICD-10-CM | POA: Diagnosis not present

## 2017-02-21 ENCOUNTER — Ambulatory Visit (INDEPENDENT_AMBULATORY_CARE_PROVIDER_SITE_OTHER): Payer: PPO | Admitting: Family Medicine

## 2017-02-21 ENCOUNTER — Encounter: Payer: Self-pay | Admitting: Family Medicine

## 2017-02-21 VITALS — BP 121/74 | HR 65 | Temp 96.9°F | Ht 70.0 in | Wt 222.4 lb

## 2017-02-21 DIAGNOSIS — B0231 Zoster conjunctivitis: Secondary | ICD-10-CM | POA: Diagnosis not present

## 2017-02-21 DIAGNOSIS — H10013 Acute follicular conjunctivitis, bilateral: Secondary | ICD-10-CM | POA: Diagnosis not present

## 2017-02-21 MED ORDER — VALACYCLOVIR HCL 1 G PO TABS: 1000.0000 mg | ORAL_TABLET | Freq: Three times a day (TID) | ORAL | 0 refills | Status: DC

## 2017-02-21 NOTE — Patient Instructions (Signed)
Great to see you!  Start valtrex right away   Shingles Shingles is an infection that causes a painful skin rash and fluid-filled blisters. Shingles is caused by the same virus that causes chickenpox. Shingles only develops in people who:  Have had chickenpox.  Have gotten the chickenpox vaccine. (This is rare.)  The first symptoms of shingles may be itching, tingling, or pain in an area on your skin. A rash will follow in a few days or weeks. The rash is usually on one side of the body in a bandlike or beltlike pattern. Over time, the rash turns into fluid-filled blisters that break open, scab over, and dry up. Medicines may:  Help you manage pain.  Help you recover more quickly.  Help to prevent long-term problems.  Follow these instructions at home: Medicines  Take medicines only as told by your doctor.  Apply an anti-itch or numbing cream to the affected area as told by your doctor. Blister and Rash Care  Take a cool bath or put cool compresses on the area of the rash or blisters as told by your doctor. This may help with pain and itching.  Keep your rash covered with a loose bandage (dressing). Wear loose-fitting clothing.  Keep your rash and blisters clean with mild soap and cool water or as told by your doctor.  Check your rash every day for signs of infection. These include redness, swelling, and pain that lasts or gets worse.  Do not pick your blisters.  Do not scratch your rash. General instructions  Rest as told by your doctor.  Keep all follow-up visits as told by your doctor. This is important.  Until your blisters scab over, your infection can cause chickenpox in people who have never had it or been vaccinated against it. To prevent this from happening, avoid touching other people or being around other people, especially: ? Babies. ? Pregnant women. ? Children who have eczema. ? Elderly people who have transplants. ? People who have chronic illnesses,  such as leukemia or AIDS. Contact a doctor if:  Your pain does not get better with medicine.  Your pain does not get better after the rash heals.  Your rash looks infected. Signs of infection include: ? Redness. ? Swelling. ? Pain that lasts or gets worse. Get help right away if:  The rash is on your face or nose.  You have pain in your face, pain around your eye area, or loss of feeling on one side of your face.  You have ear pain or you have ringing in your ear.  You have loss of taste.  Your condition gets worse. This information is not intended to replace advice given to you by your health care provider. Make sure you discuss any questions you have with your health care provider. Document Released: 06/08/2007 Document Revised: 08/16/2015 Document Reviewed: 10/01/2013 Elsevier Interactive Patient Education  [employer].

## 2017-02-21 NOTE — Progress Notes (Signed)
   HPI  Patient presents today here with rash  Pt explains hes had this painful rash for about 7-9 days, it started Friday morning when he woke up. Previously he had no symptoms. It has spread and is extending into his R eye. No crossing midline on his scalp.   No fevers, chills, sweats. He is tolerating PO well.   PMH: Smoking status noted ROS: Per HPI  Objective: BP 121/74   Pulse 65   Temp (!) 96.9 F (36.1 C) (Oral)   Ht 5\' 10"  (1.778 m)   Wt 222 lb 6.4 oz (100.9 kg)   BMI 31.91 kg/m  Gen: NAD, alert, cooperative with exam HEENT: NCAT, R eye with conjunctival injection CV: RRR, good S1/S2, no murmur Resp: CTABL, no wheezes, non-labored Ext: No edema, warm Neuro: Alert and oriented  Assessment and plan:  # Shingles With likely ocular involvement, Start valtrex right away See optho immediately.  RTC with any concern     Meds ordered this encounter  Medications  . valACYclovir (VALTREX) 1000 MG tablet    Sig: Take 1 tablet (1,000 mg total) by mouth 3 (three) times daily.    Dispense:  42 tablet    Refill:  0    Laroy Apple, MD [facility] 02/21/2017, 9:38 AM

## 2017-03-21 ENCOUNTER — Other Ambulatory Visit: Payer: Self-pay | Admitting: Family Medicine

## 2017-03-23 ENCOUNTER — Encounter: Payer: Self-pay | Admitting: Family Medicine

## 2017-03-23 ENCOUNTER — Ambulatory Visit (INDEPENDENT_AMBULATORY_CARE_PROVIDER_SITE_OTHER): Payer: PPO | Admitting: Family Medicine

## 2017-03-23 VITALS — BP 118/57 | HR 62 | Temp 97.1°F | Ht 70.0 in | Wt 224.0 lb

## 2017-03-23 DIAGNOSIS — E079 Disorder of thyroid, unspecified: Secondary | ICD-10-CM | POA: Diagnosis not present

## 2017-03-23 DIAGNOSIS — I10 Essential (primary) hypertension: Secondary | ICD-10-CM | POA: Diagnosis not present

## 2017-03-23 DIAGNOSIS — Z0289 Encounter for other administrative examinations: Secondary | ICD-10-CM

## 2017-03-23 MED ORDER — HYDROCODONE-ACETAMINOPHEN 10-325 MG PO TABS: 1.0000 | ORAL_TABLET | Freq: Four times a day (QID) | ORAL | 0 refills | Status: DC | PRN

## 2017-03-23 MED ORDER — LEVOTHYROXINE SODIUM 150 MCG PO TABS: 150.0000 ug | ORAL_TABLET | Freq: Every day | ORAL | 3 refills | Status: DC

## 2017-03-23 NOTE — Patient Instructions (Signed)
Great to see you!  Come back in 3 months unless you need us sooner.    

## 2017-03-23 NOTE — Progress Notes (Signed)
   HPI  Patient presents today here for follow-up chronic pain, hypothyroidism, and hypertension.  Hypertension Good medication compliance and tolerance.  Chronic pain Chronic neck back and knee pain. Doing well with current dose of Norco Needs refill. Contract reviewed previously.  Hypothyroidism Good medication compliance, asymptomatic   PMH: Smoking status noted ROS: Per HPI  Objective: BP (!) 118/57   Pulse 62   Temp (!) 97.1 F (36.2 C) (Oral)   Ht 5\' 10"  (1.778 m)   Wt 224 lb (101.6 kg)   BMI 32.14 kg/m  Gen: NAD, alert, cooperative with exam HEENT: NCAT CV: RRR, good S1/S2, no murmur Resp: CTABL, no wheezes, non-labored Ext: No edema, warm Neuro: Alert and oriented, No gross deficits  Assessment and plan:  #Chronic pain, controlled substance agreement signed Refilled hydrocodone times 3 months Return in 3 months  #Hypothyroidism Symptomatically stable Refill Synthroid Labs next visit  #Hypertension Well controlled on Prinzide plus Norvasc, no changes + Hx CAD  Meds ordered this encounter  Medications  . levothyroxine (SYNTHROID, LEVOTHROID) 150 MCG tablet    Sig: Take 1 tablet (150 mcg total) by mouth daily.    Dispense:  90 tablet    Refill:  3  . HYDROcodone-acetaminophen (NORCO) 10-325 MG tablet    Sig: Take 1 tablet by mouth every 6 (six) hours as needed.    Dispense:  120 tablet    Refill:  0  . HYDROcodone-acetaminophen (NORCO) 10-325 MG tablet    Sig: Take 1 tablet by mouth every 6 (six) hours as needed.    Dispense:  120 tablet    Refill:  0    Please do not fill until 30 days after date written  . HYDROcodone-acetaminophen (NORCO) 10-325 MG tablet    Sig: Take 1 tablet by mouth every 6 (six) hours as needed.    Dispense:  120 tablet    Refill:  0    Please do not fill until 60 days after date written    Laroy Apple, MD [facility] 03/23/2017, 8:46 AM

## 2017-05-04 ENCOUNTER — Telehealth: Payer: Self-pay

## 2017-05-04 DIAGNOSIS — I200 Unstable angina: Secondary | ICD-10-CM

## 2017-05-04 MED ORDER — NITROGLYCERIN 0.4 MG SL SUBL: 0.4000 mg | SUBLINGUAL_TABLET | SUBLINGUAL | 2 refills | Status: DC | PRN

## 2017-05-04 NOTE — Telephone Encounter (Signed)
Pt is requesting a refill on nitroglycerin. Please address. Thank You.

## 2017-05-26 ENCOUNTER — Other Ambulatory Visit: Payer: Self-pay | Admitting: Family Medicine

## 2017-06-17 ENCOUNTER — Other Ambulatory Visit: Payer: Self-pay | Admitting: Family Medicine

## 2017-06-23 ENCOUNTER — Ambulatory Visit (INDEPENDENT_AMBULATORY_CARE_PROVIDER_SITE_OTHER): Payer: PPO | Admitting: Family Medicine

## 2017-06-23 ENCOUNTER — Encounter: Payer: Self-pay | Admitting: Family Medicine

## 2017-06-23 VITALS — BP 120/68 | HR 58 | Temp 97.0°F | Ht 70.0 in | Wt 212.0 lb

## 2017-06-23 DIAGNOSIS — E785 Hyperlipidemia, unspecified: Secondary | ICD-10-CM

## 2017-06-23 DIAGNOSIS — E039 Hypothyroidism, unspecified: Secondary | ICD-10-CM | POA: Diagnosis not present

## 2017-06-23 DIAGNOSIS — G8929 Other chronic pain: Secondary | ICD-10-CM | POA: Diagnosis not present

## 2017-06-23 DIAGNOSIS — I10 Essential (primary) hypertension: Secondary | ICD-10-CM | POA: Diagnosis not present

## 2017-06-23 MED ORDER — HYDROCODONE-ACETAMINOPHEN 10-325 MG PO TABS: 1.0000 | ORAL_TABLET | Freq: Four times a day (QID) | ORAL | 0 refills | Status: DC | PRN

## 2017-06-23 NOTE — Progress Notes (Signed)
   HPI  Patient presents today for follow-up chronic medical conditions.  Hypertension Good medication compliance No headaches or chest pain.  Hyperlipidemia Good medication compliance No side effects from medication.  Hypothyroidism Good medication compliance. Asymptomatic  He is not fasting.  Chronic pain Doing well with hydrocodone, needs refill today.   PMH: Smoking status noted ROS: Per HPI  Objective: BP 120/68   Pulse (!) 58   Temp (!) 97 F (36.1 C) (Oral)   Ht _0  (1.778 m)   Wt 212 lb (96.2 kg)   BMI 30.42 kg/m  Gen: NAD, alert, cooperative with exam HEENT: NCAT CV: RRR, good S1/S2, no murmur Resp: CTABL, no wheezes, non-labored Ext: No edema, warm Neuro: Alert and oriented, No gross deficits  Assessment and plan:  #Chronic pain Chronic pain contract on file, patient will change providers next visit as I am leaving the practice He understands there will be a new pain contract, likely drug testing Refill x3 months  #Hypothyroidism Asymptomatic Labs  #Hyperlipidemia Continue simvastatin, labs today nonfasting  #Hypertension Well-controlled on Prinzide and Norvasc, no changes    Orders Placed This Encounter  Procedures  . CMP14+EGFR  . CBC with Differential/Platelet  . Lipid panel  . TSH    Meds ordered this encounter  Medications  . HYDROcodone-acetaminophen (NORCO) 10-325 MG tablet    Sig: Take 1 tablet by mouth every 6 (six) hours as needed.    Dispense:  120 tablet    Refill:  0  . HYDROcodone-acetaminophen (NORCO) 10-325 MG tablet    Sig: Take 1 tablet by mouth every 6 (six) hours as needed.    Dispense:  120 tablet    Refill:  0    Please do not fill until 30 days after date written  . HYDROcodone-acetaminophen (NORCO) 10-325 MG tablet    Sig: Take 1 tablet by mouth every 6 (six) hours as needed.    Dispense:  120 tablet    Refill:  0    Please do not fill until 60 days after date written    Laroy Apple,  MD [facility] 06/23/2017, 8:27 AM

## 2017-06-23 NOTE — Patient Instructions (Signed)
Great to see you!  See Dr. Warrick Parisian in 3 months

## 2017-06-24 LAB — CMP14+EGFR
ALBUMIN: 4.1 g/dL (ref 3.5–4.8)
ALT: 14 IU/L (ref 0–44)
AST: 14 IU/L (ref 0–40)
Albumin/Globulin Ratio: 2 (ref 1.2–2.2)
Alkaline Phosphatase: 50 IU/L (ref 39–117)
BUN / CREAT RATIO: 20 (ref 10–24)
BUN: 18 mg/dL (ref 8–27)
Bilirubin Total: 0.4 mg/dL (ref 0.0–1.2)
CO2: 25 mmol/L (ref 20–29)
Calcium: 9.2 mg/dL (ref 8.6–10.2)
Chloride: 104 mmol/L (ref 96–106)
Creatinine, Ser: 0.91 mg/dL (ref 0.76–1.27)
GFR calc Af Amer: 97 mL/min/{1.73_m2} (ref >59)
GFR calc non Af Amer: 84 mL/min/{1.73_m2} (ref >59)
GLUCOSE: 93 mg/dL (ref 65–99)
Globulin, Total: 2.1 g/dL (ref 1.5–4.5)
Potassium: 4.7 mmol/L (ref 3.5–5.2)
Sodium: 141 mmol/L (ref 134–144)
TOTAL PROTEIN: 6.2 g/dL (ref 6.0–8.5)

## 2017-06-24 LAB — CBC WITH DIFFERENTIAL/PLATELET
Basophils Absolute: 0 10*3/uL (ref 0.0–0.2)
Basos: 0 %
EOS (ABSOLUTE): 0.1 10*3/uL (ref 0.0–0.4)
Eos: 2 %
HEMATOCRIT: 43.3 % (ref 37.5–51.0)
HEMOGLOBIN: 14 g/dL (ref 13.0–17.7)
Immature Grans (Abs): 0 10*3/uL (ref 0.0–0.1)
Immature Granulocytes: 0 %
LYMPHS ABS: 2.1 10*3/uL (ref 0.7–3.1)
Lymphs: 29 %
MCH: 31.7 pg (ref 26.6–33.0)
MCHC: 32.3 g/dL (ref 31.5–35.7)
MCV: 98 fL — ABNORMAL HIGH (ref 79–97)
MONOCYTES: 10 %
Monocytes Absolute: 0.7 10*3/uL (ref 0.1–0.9)
NEUTROS ABS: 4.3 10*3/uL (ref 1.4–7.0)
Neutrophils: 59 %
Platelets: 265 10*3/uL (ref 150–450)
RBC: 4.42 x10E6/uL (ref 4.14–5.80)
RDW: 14.5 % (ref 12.3–15.4)
WBC: 7.2 10*3/uL (ref 3.4–10.8)

## 2017-06-24 LAB — TSH: TSH: 1.38 u[IU]/mL (ref 0.450–4.500)

## 2017-06-24 LAB — LIPID PANEL
CHOL/HDL RATIO: 2.5 ratio (ref 0.0–5.0)
Cholesterol, Total: 139 mg/dL (ref 100–199)
HDL: 55 mg/dL (ref >39)
LDL CALC: 56 mg/dL (ref 0–99)
TRIGLYCERIDES: 140 mg/dL (ref 0–149)
VLDL Cholesterol Cal: 28 mg/dL (ref 5–40)

## 2017-06-26 ENCOUNTER — Encounter: Payer: Self-pay | Admitting: Family Medicine

## 2017-09-08 ENCOUNTER — Other Ambulatory Visit: Payer: Self-pay

## 2017-09-08 MED ORDER — SIMVASTATIN 40 MG PO TABS: 40.0000 mg | ORAL_TABLET | Freq: Every day | ORAL | 0 refills | Status: DC

## 2017-09-08 MED ORDER — AMLODIPINE BESYLATE 5 MG PO TABS
5.0000 mg | ORAL_TABLET | Freq: Every day | ORAL | 0 refills | Status: DC
Start: 2017-09-08 — End: 2017-12-11

## 2017-09-25 ENCOUNTER — Encounter: Payer: Self-pay | Admitting: Family Medicine

## 2017-09-25 ENCOUNTER — Ambulatory Visit (INDEPENDENT_AMBULATORY_CARE_PROVIDER_SITE_OTHER): Payer: PPO | Admitting: Family Medicine

## 2017-09-25 VITALS — BP 116/65 | HR 52 | Temp 97.0°F | Ht 70.0 in | Wt 214.8 lb

## 2017-09-25 DIAGNOSIS — E782 Mixed hyperlipidemia: Secondary | ICD-10-CM | POA: Diagnosis not present

## 2017-09-25 DIAGNOSIS — J449 Chronic obstructive pulmonary disease, unspecified: Secondary | ICD-10-CM

## 2017-09-25 DIAGNOSIS — M1710 Unilateral primary osteoarthritis, unspecified knee: Secondary | ICD-10-CM

## 2017-09-25 DIAGNOSIS — I10 Essential (primary) hypertension: Secondary | ICD-10-CM

## 2017-09-25 DIAGNOSIS — E079 Disorder of thyroid, unspecified: Secondary | ICD-10-CM

## 2017-09-25 DIAGNOSIS — Z0289 Encounter for other administrative examinations: Secondary | ICD-10-CM

## 2017-09-25 MED ORDER — HYDROCODONE-ACETAMINOPHEN 10-325 MG PO TABS: 1.0000 | ORAL_TABLET | Freq: Four times a day (QID) | ORAL | 0 refills | Status: DC | PRN

## 2017-09-25 MED ORDER — HYDROCODONE-ACETAMINOPHEN 10-325 MG PO TABS
1.0000 | ORAL_TABLET | Freq: Four times a day (QID) | ORAL | 0 refills | Status: DC | PRN
Start: 2017-09-25 — End: 2018-01-01

## 2017-09-25 NOTE — Progress Notes (Signed)
BP 116/65   Pulse (!) 52   Temp (!) 97 F (36.1 C) (Oral)   Ht 5' 10" (1.778 m)   Wt 214 lb 12.8 oz (97.4 kg)   BMI 30.82 kg/m    Subjective:    Patient ID: [name], male    DOB: 1945/10/22, 72 y.o.   MRN: [id number]  HPI: [name] is a 72 y.o. male presenting on 09/25/2017 for Hypertension (3 month follow up) and Establish Care Wendi Snipes pt)   HPI Hypertension Patient is currently on amlodipine and atenolol, and their blood pressure today is 116/65 and heart rate of 52. Patient denies any lightheadedness or dizziness. Patient denies headaches, blurred vision, chest pains, shortness of breath, or weakness. Denies any side effects from medication and is content with current medication.   Hyperlipidemia Patient is coming in for recheck of his hyperlipidemia. The patient is currently taking simvastatin. They deny any issues with myalgias or history of liver damage from it. They deny any focal numbness or weakness or chest pain.   COPD Patient is coming in for COPD recheck today.  He is currently on Stiolto.  He has a mild chronic cough but denies any major coughing spells or wheezing spells.  He has 0nighttime symptoms per week and 1daytime symptoms per week currently.   Hypothyroidism recheck Patient is coming in for thyroid recheck today as well. They deny any issues with hair changes or heat or cold problems or diarrhea or constipation. They deny any chest pain or palpitations. They are currently on levothyroxine 184mcrograms   Chronic pain management secondary to arthritis Patient is coming in for refill of his medication for chronic pain management.  He currently takes hydrocodone 10-3 25 and gets about 120 a month which means he is taking about 4/day and has been stable on this dose.  [place] database was checked and he has been consistent and no red flags were found.  Patient seems to be compliant with regiment and upon chart review no red flags were  found.  The location of the pain is arthritis in his neck back and bilateral knees  Relevant past medical, surgical, family and social history reviewed and updated as indicated. Interim medical history since our last visit reviewed. Allergies and medications reviewed and updated.  Review of Systems  Constitutional: Negative for chills and fever.  Eyes: Negative for visual disturbance.  Respiratory: Negative for shortness of breath and wheezing.   Cardiovascular: Negative for chest pain and leg swelling.  Musculoskeletal: Positive for arthralgias. Negative for back pain and gait problem.  Skin: Negative for rash.  Neurological: Negative for dizziness, weakness and light-headedness.  All other systems reviewed and are negative.   Per HPI unless specifically indicated above   Allergies as of 09/25/2017   No Known Allergies     Medication List        Accurate as of 09/25/17  8:47 AM. Always use your most recent med list.          albuterol 108 (90 Base) MCG/ACT inhaler Commonly known as:  PROVENTIL HFA;VENTOLIN HFA Inhale 2 puffs into the lungs every 6 (six) hours as needed for wheezing or shortness of breath.   amLODipine 5 MG tablet Commonly known as:  NORVASC Take 1 tablet (5 mg total) by mouth daily.   aspirin 325 MG tablet Take 325 mg by mouth daily.   atenolol 50 MG tablet Commonly known as:  TENORMIN TAKE ONE TABLET BY MOUTH ONE  TIME DAILY   furosemide 20 MG tablet Commonly known as:  LASIX Take 1 tablet (20 mg total) by mouth daily. Prn   HYDROcodone-acetaminophen 10-325 MG tablet Commonly known as:  NORCO Take 1 tablet by mouth every 6 (six) hours as needed.   HYDROcodone-acetaminophen 10-325 MG tablet Commonly known as:  NORCO Take 1 tablet by mouth every 6 (six) hours as needed.   HYDROcodone-acetaminophen 10-325 MG tablet Commonly known as:  NORCO Take 1 tablet by mouth every 6 (six) hours as needed.   levothyroxine 150 MCG tablet Commonly known  as:  SYNTHROID, LEVOTHROID Take 1 tablet (150 mcg total) by mouth daily.   lisinopril-hydrochlorothiazide 20-25 MG tablet Commonly known as:  PRINZIDE,ZESTORETIC TAKE 1 TABLET DAILY   nitroGLYCERIN 0.4 MG SL tablet Commonly known as:  NITROSTAT Place 1 tablet (0.4 mg total) under the tongue every 5 (five) minutes as needed for chest pain.   pantoprazole 40 MG tablet Commonly known as:  PROTONIX TAKE 1 TABLET ONCE A DAY   sildenafil 20 MG tablet Commonly known as:  REVATIO Take 1-2 tablets as needed   simvastatin 40 MG tablet Commonly known as:  ZOCOR Take 1 tablet (40 mg total) by mouth at bedtime.   Tiotropium Bromide-Olodaterol 2.5-2.5 MCG/ACT Aers Inhale 2 puffs into the lungs daily.          Objective:    BP 116/65   Pulse (!) 52   Temp (!) 97 F (36.1 C) (Oral)   Ht 5' 10" (1.778 m)   Wt 214 lb 12.8 oz (97.4 kg)   BMI 30.82 kg/m   Wt Readings from Last 3 Encounters:  09/25/17 214 lb 12.8 oz (97.4 kg)  06/23/17 212 lb (96.2 kg)  03/23/17 224 lb (101.6 kg)    Physical Exam  Constitutional: He is oriented to person, place, and time. He appears well-developed and well-nourished. No distress.  Eyes: Conjunctivae are normal. No scleral icterus.  Neck: Neck supple. No thyromegaly present.  Cardiovascular: Normal rate, regular rhythm, normal heart sounds and intact distal pulses.  No murmur heard. Pulmonary/Chest: Effort normal and breath sounds normal. No respiratory distress. He has no wheezes.  Musculoskeletal: Normal range of motion. He exhibits no edema.  Lymphadenopathy:    He has no cervical adenopathy.  Neurological: He is alert and oriented to person, place, and time. Coordination normal.  Skin: Skin is warm and dry. No rash noted. He is not diaphoretic.  Psychiatric: He has a normal mood and affect. His behavior is normal.  Nursing note and vitals reviewed.       Assessment & Plan:   Problem List Items Addressed This Visit      Cardiovascular  and Mediastinum   Essential hypertension - Primary   Relevant Orders   CMP14+EGFR (Completed)     Respiratory   COPD (chronic obstructive pulmonary disease) (HCC) (Chronic)     Endocrine   Thyroid disease   Relevant Orders   TSH (Completed)     Musculoskeletal and Integument   Arthritis of knee   Relevant Medications   HYDROcodone-acetaminophen (NORCO) 10-325 MG tablet   HYDROcodone-acetaminophen (NORCO) 10-325 MG tablet   HYDROcodone-acetaminophen (NORCO) 10-325 MG tablet     Other   Hyperlipidemia   Pain management contract signed      Continue chronic pain medication, patient will continue pain management agreement that he is previously signed with our office.  Continue other medication as patient is stable and will recheck labs Follow up plan: Return in about  3 months (around 12/25/2017), or if symptoms worsen or fail to improve, for Pain management and COPD and hypertension and thyroid.  Counseling provided for all of the vaccine components Orders Placed This Encounter  Procedures  . TSH  . [place] Dettinger, MD [facility] 09/25/2017, 8:47 AM

## 2017-09-26 LAB — CMP14+EGFR
ALT: 13 IU/L (ref 0–44)
AST: 23 IU/L (ref 0–40)
Albumin/Globulin Ratio: 2 (ref 1.2–2.2)
Albumin: 4.3 g/dL (ref 3.5–4.8)
Alkaline Phosphatase: 53 IU/L (ref 39–117)
BUN/Creatinine Ratio: 13 (ref 10–24)
BUN: 13 mg/dL (ref 8–27)
Bilirubin Total: 0.4 mg/dL (ref 0.0–1.2)
CALCIUM: 10.1 mg/dL (ref 8.6–10.2)
CO2: 25 mmol/L (ref 20–29)
CREATININE: 1 mg/dL (ref 0.76–1.27)
Chloride: 102 mmol/L (ref 96–106)
GFR, EST AFRICAN AMERICAN: 87 mL/min/{1.73_m2} (ref >59)
GFR, EST NON AFRICAN AMERICAN: 75 mL/min/{1.73_m2} (ref >59)
GLUCOSE: 94 mg/dL (ref 65–99)
Globulin, Total: 2.2 g/dL (ref 1.5–4.5)
Potassium: 4.7 mmol/L (ref 3.5–5.2)
Sodium: 144 mmol/L (ref 134–144)
TOTAL PROTEIN: 6.5 g/dL (ref 6.0–8.5)

## 2017-09-26 LAB — TSH: TSH: 2.14 u[IU]/mL (ref 0.450–4.500)

## 2017-12-11 ENCOUNTER — Other Ambulatory Visit: Payer: Self-pay | Admitting: Family Medicine

## 2017-12-12 ENCOUNTER — Encounter: Payer: Self-pay | Admitting: *Deleted

## 2017-12-12 ENCOUNTER — Ambulatory Visit (INDEPENDENT_AMBULATORY_CARE_PROVIDER_SITE_OTHER): Payer: PPO | Admitting: *Deleted

## 2017-12-12 VITALS — BP 142/82 | HR 64 | Ht 70.0 in | Wt 229.0 lb

## 2017-12-12 DIAGNOSIS — Z0000 Encounter for general adult medical examination without abnormal findings: Secondary | ICD-10-CM | POA: Diagnosis not present

## 2017-12-12 NOTE — Patient Instructions (Signed)
Please work on your goal of increasing your water intake to 4-6 glasses per day.   Consider getting the Shingrix (Shingles vaccine) in the future.  Please schedule your regular follow up with Dr. Martinique- cardiology.  Please follow up with Dr. Warrick Parisian as scheduled.  Thank you for coming in for your Annual Wellness Visit today!!   Preventive Care 73 Years and Older, Male Preventive care refers to lifestyle choices and visits with your health care provider that can promote health and wellness. What does preventive care include?  A yearly physical exam. This is also called an annual well check.  Dental exams once or twice a year.  Routine eye exams. Ask your health care provider how often you should have your eyes checked.  Personal lifestyle choices, including: ? Daily care of your teeth and gums. ? Regular physical activity. ? Eating a healthy diet. ? Avoiding tobacco and drug use. ? Limiting alcohol use. ? Practicing safe sex. ? Taking low doses of aspirin every day. ? Taking vitamin and mineral supplements as recommended by your health care provider. What happens during an annual well check? The services and screenings done by your health care provider during your annual well check will depend on your age, overall health, lifestyle risk factors, and family history of disease. Counseling Your health care provider may ask you questions about your:  Alcohol use.  Tobacco use.  Drug use.  Emotional well-being.  Home and relationship well-being.  Sexual activity.  Eating habits.  History of falls.  Memory and ability to understand (cognition).  Work and work Statistician.  Screening You may have the following tests or measurements:  Height, weight, and BMI.  Blood pressure.  Lipid and cholesterol levels. These may be checked every 5 years, or more frequently if you are over 34 years old.  Skin check.  Lung cancer screening. You may have this screening  every year starting at age 73 if you have a 30-pack-year history of smoking and currently smoke or have quit within the past 15 years.  Fecal occult blood test (FOBT) of the stool. You may have this test every year starting at age 73.  Flexible sigmoidoscopy or colonoscopy. You may have a sigmoidoscopy every 5 years or a colonoscopy every 10 years starting at age 73.  Prostate cancer screening. Recommendations will vary depending on your family history and other risks.  Hepatitis C blood test.  Hepatitis B blood test.  Sexually transmitted disease (STD) testing.  Diabetes screening. This is done by checking your blood sugar (glucose) after you have not eaten for a while (fasting). You may have this done every 1-3 years.  Abdominal aortic aneurysm (AAA) screening. You may need this if you are a current or former smoker.  Osteoporosis. You may be screened starting at age 8 if you are at high risk.  Talk with your health care provider about your test results, treatment options, and if necessary, the need for more tests. Vaccines Your health care provider may recommend certain vaccines, such as:  Influenza vaccine. This is recommended every year.  Tetanus, diphtheria, and acellular pertussis (Tdap, Td) vaccine. You may need a Td booster every 10 years.  Varicella vaccine. You may need this if you have not been vaccinated.  Zoster vaccine. You may need this after age 73.  Measles, mumps, and rubella (MMR) vaccine. You may need at least one dose of MMR if you were born in 1957 or later. You may also need a second dose.  Pneumococcal 13-valent conjugate (PCV13) vaccine. One dose is recommended after age 73.  Pneumococcal polysaccharide (PPSV23) vaccine. One dose is recommended after age 68.  Meningococcal vaccine. You may need this if you have certain conditions.  Hepatitis A vaccine. You may need this if you have certain conditions or if you travel or work in places where you may  be exposed to hepatitis A.  Hepatitis B vaccine. You may need this if you have certain conditions or if you travel or work in places where you may be exposed to hepatitis B.  Haemophilus influenzae type b (Hib) vaccine. You may need this if you have certain risk factors.  Talk to your health care provider about which screenings and vaccines you need and how often you need them. This information is not intended to replace advice given to you by your health care provider. Make sure you discuss any questions you have with your health care provider. Document Released: 01/16/2015 Document Revised: 09/09/2015 Document Reviewed: 10/21/2014 Elsevier Interactive Patient Education  [employer].

## 2017-12-12 NOTE — Progress Notes (Signed)
Subjective:   SOLON ALBAN is a 72 y.o. male who presents for Medicare Annual/Subsequent preventive examination.  Mr. 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  He continues to help a friend who is a Museum/gallery curator do small jobs on occasion.  Mr. Baquero enjoys hunting and fishing, but does not go very often because he is the primary caregiver for his wife who has significant health problems.  He does not participate in regular exercise, but stays very active around his home as he does the inside and outside home maintenance.  He is active in his church.  Mr. Karel lives at home with his wife, and 9 year old grandson.  They also have dogs that live indoors and outdoors.  He feels his health is unchanged from last year, and reports no ER visits, hospitalizations, or surgeries in the past year.   Review of Systems:   All systems negative today  Cardiac Risk Factors include: advanced age (>6men, >29 women);dyslipidemia;hypertension;male gender     Objective:    Vitals: BP (!) 142/82   Pulse 64   Ht 5\' 10"  (5.456 m)   Wt 229 lb (103.9 kg)   BMI 32.86 kg/m   Body mass index is 32.86 kg/m.  Advanced Directives 12/12/2017 12/07/2016 05/15/2014 05/02/2013 01/15/2013  Does Patient Have a Medical Advance Directive? No No No Patient does not have advance directive;Patient would not like information Patient does not have advance directive  Would patient like information on creating a medical advance directive? No - Patient declined No - Patient declined No - patient declined information - -  Pre-existing out of facility DNR order (yellow form or pink MOST form) - - - No -    Tobacco Social History   Tobacco Use  Smoking Status Former Smoker  . Packs/day: 3.00  . Years: 20.00  . Pack years: 60.00  . Types: Cigarettes  . Last attempt to quit: 05/26/1975  . Years since quitting: 42.5  Smokeless Tobacco Current User  . Types: Chew     Started back using  chewing tobacco 3 months ago.  Ready to quit: No Counseling given: Yes   Clinical Intake:     Pain Score: 0-No pain                 Past Medical History:  Diagnosis Date  . Anginal pain ([place])   . Arthritis    OSTEO BACK KNEES HIPS & HANDS  . Arthropathy, unspecified, site unspecified   . Chronic airway obstruction, not elsewhere classified   . Coronary atherosclerosis of unspecified type of vessel, native or graft    a. LHC 5/16:  mLAD 20%, CFX stent ok, OM3 stent ok, mRCA 30%, normal LVF  . Headache(784.0)   . Hx of echocardiogram    a. Echo 5/16:  mild LVH, EF 60%, mildly dilated Ao root (40 mm)  . Hypothyroidism   . Other and unspecified hyperlipidemia   . Thyroid disease    hypothyroid  . Unspecified essential hypertension   . Unspecified sleep apnea    Dr. Jacelyn Grip at [facility] told pt he had sleep apnea, but never sent him for a sleep study so he doesn't wear a CPAP   Past Surgical History:  Procedure Laterality Date  . ANKLE SURGERY    . BALLOON DILATION N/A 05/02/2013   Procedure: BALLOON DILATION;  Surgeon: Rogene Houston, MD;  [facility];  Service: Endoscopy;  Laterality: N/A;  .  CARDIAC CATHETERIZATION  01/15/2013  . CARDIAC CATHETERIZATION N/A 05/16/2014   Procedure: Left Heart Cath and Coronary Angiography;  Surgeon: Peter M Martinique, MD;  Location: [facility];  Service: Cardiovascular;  Laterality: N/A;  . COLONOSCOPY N/A 05/02/2013   Procedure: COLONOSCOPY;  Surgeon: Rogene Houston, MD;  [facility];  Service: Endoscopy;  Laterality: N/A;  200-moved to 1200 Ann notified pt  . CORONARY STENT PLACEMENT     2003, 2004  . ESOPHAGOGASTRODUODENOSCOPY N/A 05/02/2013   Procedure: ESOPHAGOGASTRODUODENOSCOPY (EGD);  Surgeon: Rogene Houston, MD;  [facility];  Service: Endoscopy;  Laterality: N/A;  . HEMORRHOID SURGERY    . HIP ARTHROPLASTY    . KNEE SURGERY Left 12/2013  . LEFT HEART CATHETERIZATION WITH  CORONARY ANGIOGRAM N/A 01/15/2013   Procedure: LEFT HEART CATHETERIZATION WITH CORONARY ANGIOGRAM;  Surgeon: Sinclair Grooms, MD;  Location: [facility];  Service: Cardiovascular;  Laterality: N/A;  . MALONEY DILATION N/A 05/02/2013   Procedure: Venia Minks DILATION;  Surgeon: Rogene Houston, MD;  [facility];  Service: Endoscopy;  Laterality: N/A;  . SAVORY DILATION N/A 05/02/2013   Procedure: SAVORY DILATION;  Surgeon: Rogene Houston, MD;  [facility];  Service: Endoscopy;  Laterality: N/A;  . TONSILLECTOMY     Family History  Problem Relation Age of Onset  . Stroke Mother   . CAD Father        MI at age 60  . Colon cancer Neg Hx    Social History   Socioeconomic History  . Marital status: Married    Spouse name: Not on file  . Number of children: 2  . Years of education: Not on file  . Highest education level: 11th grade  Occupational History  . Occupation: Company secretary: Journalist, newspaper  . Occupation: Retired   Scientific laboratory technician  . Financial resource strain: Not hard at all  . Food insecurity:    Worry: Never true    Inability: Never true  . Transportation needs:    Medical: No    Non-medical: No  Tobacco Use  . Smoking status: Former Smoker    Packs/day: 3.00    Years: 20.00    Pack years: 60.00    Types: Cigarettes    Last attempt to quit: 05/26/1975    Years since quitting: 42.5  . Smokeless tobacco: Current User    Types: Chew  Substance and Sexual Activity  . Alcohol use: No  . Drug use: No  . Sexual activity: Not on file  Lifestyle  . Physical activity:    Days per week: 0 days    Minutes per session: 0 min  . Stress: Only a little  Relationships  . Social connections:    Talks on phone: More than three times a week    Gets together: More than three times a week    Attends religious service: More than 4 times per year    Active member of club or organization: Yes    Attends meetings of clubs or organizations: More than 4 times  per year    Relationship status: Married  Other Topics Concern  . Not on file  Social History Narrative   Occupation:[employer] Administrator, [employer] Service   Married 2 children one boy,one girl.    Outpatient Encounter Medications as of 12/12/2017  Medication Sig  . albuterol (PROAIR HFA) 108 (90 BASE) MCG/ACT inhaler Inhale 2 puffs into the lungs every 6 (six)  hours as needed for wheezing or shortness of breath.  Marland Kitchen amLODipine (NORVASC) 5 MG tablet Take 1 tablet (5 mg total) by mouth daily.  Marland Kitchen aspirin 325 MG tablet Take 325 mg by mouth daily.    Marland Kitchen atenolol (TENORMIN) 50 MG tablet TAKE ONE TABLET BY MOUTH ONE TIME DAILY  . furosemide (LASIX) 20 MG tablet Take 1 tablet (20 mg total) by mouth daily. Prn  . HYDROcodone-acetaminophen (NORCO) 10-325 MG tablet Take 1 tablet by mouth every 6 (six) hours as needed.  Marland Kitchen HYDROcodone-acetaminophen (NORCO) 10-325 MG tablet Take 1 tablet by mouth every 6 (six) hours as needed.  Marland Kitchen HYDROcodone-acetaminophen (NORCO) 10-325 MG tablet Take 1 tablet by mouth every 6 (six) hours as needed.  Marland Kitchen levothyroxine (SYNTHROID, LEVOTHROID) 150 MCG tablet Take 1 tablet (150 mcg total) by mouth daily.  Marland Kitchen lisinopril-hydrochlorothiazide (PRINZIDE,ZESTORETIC) 20-25 MG tablet TAKE 1 TABLET DAILY  . nitroGLYCERIN (NITROSTAT) 0.4 MG SL tablet Place 1 tablet (0.4 mg total) under the tongue every 5 (five) minutes as needed for chest pain.  . pantoprazole (PROTONIX) 40 MG tablet TAKE 1 TABLET ONCE A DAY  . sildenafil (REVATIO) 20 MG tablet Take 1-2 tablets as needed  . simvastatin (ZOCOR) 40 MG tablet Take 1 tablet (40 mg total) by mouth at bedtime.  . Tiotropium Bromide-Olodaterol (STIOLTO RESPIMAT) 2.5-2.5 MCG/ACT AERS Inhale 2 puffs into the lungs daily.   No facility-administered encounter medications on file as of 12/12/2017.     Activities of Daily Living In your present state of health, do you have any difficulty performing the following activities: 12/12/2017  Hearing? Y  Comment  Bilateral hearing loss, left worse than right.  Has been for hearing evaluation, does not have hearing aids due to cost   Vision? N  Difficulty concentrating or making decisions? N  Walking or climbing stairs? N  Dressing or bathing? N  Doing errands, shopping? N  Preparing Food and eating ? N  Using the Toilet? N  In the past six months, have you accidently leaked urine? N  Do you have problems with loss of bowel control? N  Managing your Medications? N  Managing your Finances? N  Housekeeping or managing your Housekeeping? N  Some recent data might be hidden    Patient Care Team: Dettinger, Fransisca Kaufmann, MD as PCP - General (Family Medicine) Martinique, Peter M, MD as Consulting Physician (Cardiology) Juanito Doom, MD as Consulting Physician (Pulmonary Disease)   Assessment:   This is a routine wellness examination for Leor.  Exercise Activities and Dietary recommendations  Mr. Saling states he eats 3 meals per day, and cooks most of them at home.  He usually has eggs or pancakes and bacon or sausage for breakfast, soup and sandwich for lunch, and protein with various side dishes for supper.  Recommended reducing intake of processed meats, and having a diet of mostly lean proteins, non-starchy vegetables, fruits, and whole grains.  Patient has access to and resources for all the food he needs.   Current Exercise Habits: The patient does not participate in regular exercise at present, Exercise limited by: cardiac condition(s)  Goals    . DIET - INCREASE WATER INTAKE     Increase to at least 4-6 glasses per day.        Fall Risk Fall Risk  12/12/2017 09/25/2017 06/23/2017 03/23/2017 02/21/2017  Falls in the past year? 0 No No No No   Is the patient's home free of loose throw rugs in walkways, pet beds,  electrical cords, etc?   yes      Grab bars in the bathroom? yes      Handrails on the stairs?   yes      Adequate lighting?   yes    Depression Screen PHQ 2/9 Scores  12/12/2017 09/25/2017 06/23/2017 03/23/2017  PHQ - 2 Score 0 0 0 0    Cognitive Function MMSE - Mini Mental State Exam 12/12/2017 12/07/2016  Orientation to time 5 5  Orientation to Place 5 5  Registration 3 3  Attention/ Calculation 3 2  Recall 3 2  Language- name 2 objects 2 2  Language- repeat 1 1  Language- follow 3 step command 3 3  Language- read & follow direction 1 1  Write a sentence 1 1  Copy design 1 1  Total score 28 26        Immunization History  Administered Date(s) Administered  . Influenza Split 10/06/2012  . Influenza, High Dose Seasonal PF 09/14/2013  . Influenza,inj,Quad PF,6+ Mos 10/01/2014, 10/04/2016  . Influenza-Unspecified 10/07/2015, 10/02/2017  . Pneumococcal Conjugate-13 12/24/2012  . Pneumococcal-Unspecified 10/02/2017  . Tdap 04/04/2013    Qualifies for Shingles Vaccine? Yes, declined today  Screening Tests Health Maintenance  Topic Date Due  . Hepatitis C Screening  15-Jul-1945  . COLONOSCOPY  05/03/2018  . TETANUS/TDAP  04/05/2023  . INFLUENZA VACCINE  Completed  . PNA vac Low Risk Adult  Completed  Recommend Hepatitis C screening at next visit with Dr. Warrick Parisian.    Cancer Screenings: Lung: Low Dose CT Chest recommended if Age 60-80 years, 30 pack-year currently smoking OR have quit w/in 15years. Patient does qualify. Colorectal: up to date  Additional Screenings:  Hepatitis C Screening: Recommend at next visit with Dr. Warrick Parisian       Plan:     Work on your goal of increasing your water intake to 4-6 glasses per day.  Consider getting the Shingrix (Shingles vaccine) in the future. Schedule your regular follow up with Dr. Martinique- cardiology. Follow up with Dr. Warrick Parisian as scheduled.   I have personally reviewed and noted the following in the patient's chart:   . Medical and social history . Use of alcohol, tobacco or illicit drugs  . Current medications and supplements . Functional ability and status . Nutritional  status . Physical activity . Advanced directives . List of other physicians . Hospitalizations, surgeries, and ER visits in previous 12 months . Vitals . Screenings to include cognitive, depression, and falls . Referrals and appointments  In addition, I have reviewed and discussed with patient certain preventive protocols, quality metrics, and best practice recommendations. A written personalized care plan for preventive services as well as general preventive health recommendations were provided to patient.     Denyce Robert, RN  12/12/2017

## 2017-12-13 ENCOUNTER — Other Ambulatory Visit: Payer: Self-pay | Admitting: *Deleted

## 2017-12-13 MED ORDER — LISINOPRIL-HYDROCHLOROTHIAZIDE 20-25 MG PO TABS: 1.0000 | ORAL_TABLET | Freq: Every day | ORAL | 0 refills | Status: DC

## 2017-12-25 ENCOUNTER — Other Ambulatory Visit: Payer: Self-pay | Admitting: Family Medicine

## 2018-01-01 ENCOUNTER — Ambulatory Visit (INDEPENDENT_AMBULATORY_CARE_PROVIDER_SITE_OTHER): Payer: PPO | Admitting: Family Medicine

## 2018-01-01 ENCOUNTER — Encounter: Payer: Self-pay | Admitting: Family Medicine

## 2018-01-01 VITALS — BP 151/78 | HR 64 | Temp 97.0°F | Ht 70.0 in | Wt 229.4 lb

## 2018-01-01 DIAGNOSIS — J449 Chronic obstructive pulmonary disease, unspecified: Secondary | ICD-10-CM | POA: Diagnosis not present

## 2018-01-01 DIAGNOSIS — G8929 Other chronic pain: Secondary | ICD-10-CM

## 2018-01-01 DIAGNOSIS — E782 Mixed hyperlipidemia: Secondary | ICD-10-CM

## 2018-01-01 DIAGNOSIS — I10 Essential (primary) hypertension: Secondary | ICD-10-CM

## 2018-01-01 DIAGNOSIS — E079 Disorder of thyroid, unspecified: Secondary | ICD-10-CM | POA: Diagnosis not present

## 2018-01-01 MED ORDER — AMLODIPINE BESYLATE 10 MG PO TABS: 10.0000 mg | ORAL_TABLET | Freq: Every day | ORAL | 3 refills | Status: DC

## 2018-01-01 MED ORDER — HYDROCODONE-ACETAMINOPHEN 10-325 MG PO TABS: 1.0000 | ORAL_TABLET | Freq: Four times a day (QID) | ORAL | 0 refills | Status: DC | PRN

## 2018-01-01 MED ORDER — SILDENAFIL CITRATE 20 MG PO TABS: 20.0000 mg | ORAL_TABLET | ORAL | 1 refills | Status: DC | PRN

## 2018-01-01 NOTE — Progress Notes (Signed)
BP (!) 151/78   Pulse 64   Temp (!) 97 F (36.1 C) (Oral)   Ht 5' 10"  (1.778 m)   Wt 229 lb 6.4 oz (104.1 kg)   BMI 32.92 kg/m    Subjective:    Patient ID: [name], male    DOB: 08-Apr-1945, 72 y.o.   MRN: [id number]  HPI: [name] is a 71 y.o. male presenting on 01/01/2018 for Hypertension (3 month follow up) and Hyperlipidemia   HPI Hypertension Patient is currently on amlodipine and atenolol and lisinopril hydrochlorothiazide, and their blood pressure today is 151/78 and he says at home he has been consistently getting 150s as well. Patient denies any lightheadedness or dizziness. Patient denies headaches, blurred vision, chest pains, shortness of breath, or weakness. Denies any side effects from medication and is content with current medication.   COPD Patient is coming in for COPD recheck today.  He is currently on albuterol and Stiolto.  He has a mild chronic cough but denies any major coughing spells or wheezing spells.  He has 0nighttime symptoms per week and 0daytime symptoms per week currently.   Hypothyroidism recheck Patient is coming in for thyroid recheck today as well. They deny any issues with hair changes or heat or cold problems or diarrhea or constipation. They deny any chest pain or palpitations. They are currently on levothyroxine 12mcrograms   Chronic pain management medication refill Patient is coming in for pain medication refill.  He had been doing pretty good on his medication except for he did run out about 9 days ago with the holidays was unable to get in.  Patient has had ED and would like to try Viagra again, it was too expensive previously and this was multiple years ago and now it is gone generic.  Relevant past medical, surgical, family and social history reviewed and updated as indicated. Interim medical history since our last visit reviewed. Allergies and medications reviewed and updated.  Review of Systems  Constitutional:  Negative for chills and fever.  Eyes: Negative for visual disturbance.  Respiratory: Negative for shortness of breath and wheezing.   Cardiovascular: Negative for chest pain and leg swelling.  Musculoskeletal: Negative for back pain and gait problem.  Skin: Negative for rash.  Neurological: Negative for dizziness, weakness, light-headedness and headaches.  All other systems reviewed and are negative.   Per HPI unless specifically indicated above   Allergies as of 01/01/2018   No Known Allergies     Medication List       Accurate as of January 01, 2018  9:08 AM. Always use your most recent med list.        albuterol 108 (90 Base) MCG/ACT inhaler Commonly known as:  PROAIR HFA Inhale 2 puffs into the lungs every 6 (six) hours as needed for wheezing or shortness of breath.   amLODipine 10 MG tablet Commonly known as:  NORVASC Take 1 tablet (10 mg total) by mouth daily.   aspirin 325 MG tablet Take 325 mg by mouth daily.   atenolol 50 MG tablet Commonly known as:  TENORMIN TAKE ONE TABLET BY MOUTH ONE TIME DAILY   furosemide 20 MG tablet Commonly known as:  LASIX Take 1 tablet (20 mg total) by mouth daily. Prn   HYDROcodone-acetaminophen 10-325 MG tablet Commonly known as:  NORCO Take 1 tablet by mouth every 6 (six) hours as needed.   HYDROcodone-acetaminophen 10-325 MG tablet Commonly known as:  NORCO Take 1 tablet by mouth  every 6 (six) hours as needed.   HYDROcodone-acetaminophen 10-325 MG tablet Commonly known as:  NORCO Take 1 tablet by mouth every 6 (six) hours as needed.   levothyroxine 150 MCG tablet Commonly known as:  SYNTHROID, LEVOTHROID Take 1 tablet (150 mcg total) by mouth daily.   lisinopril-hydrochlorothiazide 20-25 MG tablet Commonly known as:  PRINZIDE,ZESTORETIC Take 1 tablet by mouth daily.   pantoprazole 40 MG tablet Commonly known as:  PROTONIX TAKE 1 TABLET ONCE A DAY   sildenafil 20 MG tablet Commonly known as:  REVATIO Take  1-3 tablets (20-60 mg total) by mouth as needed.   simvastatin 40 MG tablet Commonly known as:  ZOCOR Take 1 tablet (40 mg total) by mouth at bedtime.   Tiotropium Bromide-Olodaterol 2.5-2.5 MCG/ACT Aers Commonly known as:  STIOLTO RESPIMAT Inhale 2 puffs into the lungs daily.          Objective:    BP (!) 151/78   Pulse 64   Temp (!) 97 F (36.1 C) (Oral)   Ht 5' 10"  (1.778 m)   Wt 229 lb 6.4 oz (104.1 kg)   BMI 32.92 kg/m   Wt Readings from Last 3 Encounters:  01/01/18 229 lb 6.4 oz (104.1 kg)  12/12/17 229 lb (103.9 kg)  09/25/17 214 lb 12.8 oz (97.4 kg)    Physical Exam Vitals signs and nursing note reviewed.  Constitutional:      General: He is not in acute distress.    Appearance: He is well-developed. He is not diaphoretic.  Eyes:     General: No scleral icterus.       Right eye: No discharge.     Conjunctiva/sclera: Conjunctivae normal.     Pupils: Pupils are equal, round, and reactive to light.  Neck:     Musculoskeletal: Neck supple.     Thyroid: No thyromegaly.  Cardiovascular:     Rate and Rhythm: Normal rate and regular rhythm.     Heart sounds: Normal heart sounds. No murmur.  Pulmonary:     Effort: Pulmonary effort is normal. No respiratory distress.     Breath sounds: Normal breath sounds. No wheezing.  Musculoskeletal: Normal range of motion.        General: Tenderness (Low back pain, chronic) present.  Lymphadenopathy:     Cervical: No cervical adenopathy.  Skin:    General: Skin is warm and dry.     Findings: No rash.  Neurological:     Mental Status: He is alert and oriented to person, place, and time.     Coordination: Coordination normal.  Psychiatric:        Behavior: Behavior normal.         Assessment & Plan:   Problem List Items Addressed This Visit      Cardiovascular and Mediastinum   Essential hypertension   Relevant Medications   sildenafil (REVATIO) 20 MG tablet   amLODipine (NORVASC) 10 MG tablet   Other  Relevant Orders   BMP8+EGFR     Respiratory   COPD (chronic obstructive pulmonary disease) (HCC) (Chronic)     Endocrine   Thyroid disease   Relevant Orders   TSH     Other   Hyperlipidemia - Primary   Relevant Medications   sildenafil (REVATIO) 20 MG tablet   amLODipine (NORVASC) 10 MG tablet   Encounter for chronic pain management    Sent Viagra for him, increased amlodipine to 10 mg, instructed patient not to use Viagra with nitro, he does not currently  use his nitro hardly at all. We will recheck thyroid and blood work today and continue other medication currently.  Follow up plan: Return in about 3 months (around 04/02/2018), or if symptoms worsen or fail to improve, for Chronic pain and hypertension and thyroid.  Counseling provided for all of the vaccine components Orders Placed This Encounter  Procedures  . TSH  . BMP8+EGFR    Caryl Pina, MD [facility] 01/01/2018, 9:08 AM

## 2018-01-02 LAB — BMP8+EGFR
BUN/Creatinine Ratio: 16 (ref 10–24)
BUN: 17 mg/dL (ref 8–27)
CO2: 21 mmol/L (ref 20–29)
Calcium: 9.6 mg/dL (ref 8.6–10.2)
Chloride: 103 mmol/L (ref 96–106)
Creatinine, Ser: 1.04 mg/dL (ref 0.76–1.27)
GFR calc Af Amer: 82 mL/min/{1.73_m2} (ref >59)
GFR calc non Af Amer: 71 mL/min/{1.73_m2} (ref >59)
Glucose: 90 mg/dL (ref 65–99)
Potassium: 4.3 mmol/L (ref 3.5–5.2)
Sodium: 140 mmol/L (ref 134–144)

## 2018-01-02 LAB — TSH: TSH: 1.84 u[IU]/mL (ref 0.450–4.500)

## 2018-03-19 ENCOUNTER — Ambulatory Visit (INDEPENDENT_AMBULATORY_CARE_PROVIDER_SITE_OTHER): Payer: PPO | Admitting: Family Medicine

## 2018-03-19 ENCOUNTER — Other Ambulatory Visit: Payer: Self-pay

## 2018-03-19 ENCOUNTER — Encounter: Payer: Self-pay | Admitting: Family Medicine

## 2018-03-19 VITALS — BP 133/80 | HR 62 | Temp 97.1°F | Ht 70.0 in | Wt 238.2 lb

## 2018-03-19 DIAGNOSIS — Z0289 Encounter for other administrative examinations: Secondary | ICD-10-CM

## 2018-03-19 DIAGNOSIS — E079 Disorder of thyroid, unspecified: Secondary | ICD-10-CM

## 2018-03-19 DIAGNOSIS — I10 Essential (primary) hypertension: Secondary | ICD-10-CM | POA: Diagnosis not present

## 2018-03-19 DIAGNOSIS — K219 Gastro-esophageal reflux disease without esophagitis: Secondary | ICD-10-CM | POA: Diagnosis not present

## 2018-03-19 DIAGNOSIS — E782 Mixed hyperlipidemia: Secondary | ICD-10-CM

## 2018-03-19 MED ORDER — LEVOTHYROXINE SODIUM 150 MCG PO TABS: 150.0000 ug | ORAL_TABLET | Freq: Every day | ORAL | 3 refills | Status: DC

## 2018-03-19 MED ORDER — HYDROCODONE-ACETAMINOPHEN 10-325 MG PO TABS: 1.0000 | ORAL_TABLET | Freq: Four times a day (QID) | ORAL | 0 refills | Status: DC | PRN

## 2018-03-19 MED ORDER — LISINOPRIL-HYDROCHLOROTHIAZIDE 20-25 MG PO TABS: 1.0000 | ORAL_TABLET | Freq: Every day | ORAL | 3 refills | Status: DC

## 2018-03-19 MED ORDER — SIMVASTATIN 40 MG PO TABS: 40.0000 mg | ORAL_TABLET | Freq: Every day | ORAL | 3 refills | Status: DC

## 2018-03-19 NOTE — Progress Notes (Signed)
BP 133/80   Pulse 62   Temp (!) 97.1 F (36.2 C) (Oral)   Ht 5' 10"  (1.778 m)   Wt 238 lb 3.2 oz (108 kg)   BMI 34.18 kg/m    Subjective:    Patient ID: [name], male    DOB: Mar 13, 1945, 73 y.o.   MRN: [id number]  HPI: [name] is a 73 y.o. male presenting on 03/19/2018 for Hypertension (3 month follow up) and Hyperlipidemia   HPI Hypothyroidism recheck Patient is coming in for thyroid recheck today as well. They deny any issues with hair changes or heat or cold problems or diarrhea or constipation. They deny any chest pain or palpitations. They are currently on levothyroxine 150 micrograms   Hyperlipidemia Patient is coming in for recheck of his hyperlipidemia. The patient is currently taking simvastatin. They deny any issues with myalgias or history of liver damage from it. They deny any focal numbness or weakness or chest pain.   GERD Patient is currently on Protonix.  She denies any major symptoms or abdominal pain or belching or burping. She denies any blood in her stool or lightheadedness or dizziness.   Hypertension Patient is currently on amlodipine and atenolol and lisinopril-hydrochlorothiazide, and their blood pressure today is 133/80. Patient denies any lightheadedness or dizziness. Patient denies headaches, blurred vision, chest pains, shortness of breath, or weakness. Denies any side effects from medication and is content with current medication.   Pain management recheck Pain assessment: Cause of pain-hip and knee and lower back osteoarthritis bilateral Pain location-see above Pain on scale of 1-10- 2 Frequency-Daily intermittent What increases pain-movement, also has stiffness in the morning What makes pain Better-medication and stiffness relieves throughout the day Effects on ADL -with pain medication has minimal limitations currently Any change in general medical condition-none  Current opioids rx-Norco 10-3 25 # meds rx-120  Effectiveness of current meds-very effective currently Adverse reactions form pain meds-none Morphine equivalent-40  Pill count performed-No Last drug screen -performed today ( high risk q71m moderate risk q697mlow risk yearly ) Urine drug screen today- Yes [place]  If yes were their any concerning findings? -None  No flowsheet data found.   Pain contract signed on: 03/19/2018  Relevant past medical, surgical, family and social history reviewed and updated as indicated. Interim medical history since our last visit reviewed. Allergies and medications reviewed and updated.  Review of Systems  Constitutional: Negative for chills and fever.  Eyes: Negative for visual disturbance.  Respiratory: Negative for shortness of breath and wheezing.   Cardiovascular: Negative for chest pain and leg swelling.  Musculoskeletal: Positive for arthralgias and back pain. Negative for gait problem.  Skin: Negative for rash.  Neurological: Negative for dizziness, weakness and numbness.  All other systems reviewed and are negative.   Per HPI unless specifically indicated above   Allergies as of 03/19/2018   No Known Allergies     Medication List       Accurate as of March 19, 2018  8:46 AM. Always use your most recent med list.        albuterol 108 (90 Base) MCG/ACT inhaler Commonly known as:  ProAir HFA Inhale 2 puffs into the lungs every 6 (six) hours as needed for wheezing or shortness of breath.   amLODipine 10 MG tablet Commonly known as:  NORVASC Take 1 tablet (10 mg total) by mouth daily.   aspirin 325 MG tablet Take 325 mg by mouth daily.  atenolol 50 MG tablet Commonly known as:  TENORMIN TAKE ONE TABLET BY MOUTH ONE TIME DAILY   furosemide 20 MG tablet Commonly known as:  LASIX Take 1 tablet (20 mg total) by mouth daily. Prn   HYDROcodone-acetaminophen 10-325 MG tablet Commonly known as:  NORCO Take 1 tablet by mouth every 6 (six) hours as  needed.   HYDROcodone-acetaminophen 10-325 MG tablet Commonly known as:  NORCO Take 1 tablet by mouth every 6 (six) hours as needed.   HYDROcodone-acetaminophen 10-325 MG tablet Commonly known as:  NORCO Take 1 tablet by mouth every 6 (six) hours as needed.   levothyroxine 150 MCG tablet Commonly known as:  SYNTHROID, LEVOTHROID Take 1 tablet (150 mcg total) by mouth daily.   lisinopril-hydrochlorothiazide 20-25 MG tablet Commonly known as:  PRINZIDE,ZESTORETIC Take 1 tablet by mouth daily.   pantoprazole 40 MG tablet Commonly known as:  PROTONIX TAKE 1 TABLET ONCE A DAY   sildenafil 20 MG tablet Commonly known as:  REVATIO Take 1-3 tablets (20-60 mg total) by mouth as needed.   simvastatin 40 MG tablet Commonly known as:  ZOCOR Take 1 tablet (40 mg total) by mouth at bedtime.   Tiotropium Bromide-Olodaterol 2.5-2.5 MCG/ACT Aers Commonly known as:  Stiolto Respimat Inhale 2 puffs into the lungs daily.          Objective:    BP 133/80   Pulse 62   Temp (!) 97.1 F (36.2 C) (Oral)   Ht 5' 10"  (1.778 m)   Wt 238 lb 3.2 oz (108 kg)   BMI 34.18 kg/m   Wt Readings from Last 3 Encounters:  03/19/18 238 lb 3.2 oz (108 kg)  01/01/18 229 lb 6.4 oz (104.1 kg)  12/12/17 229 lb (103.9 kg)    Physical Exam Vitals signs and nursing note reviewed.  Constitutional:      General: He is not in acute distress.    Appearance: He is well-developed. He is not diaphoretic.  Eyes:     General: No scleral icterus.    Conjunctiva/sclera: Conjunctivae normal.  Neck:     Musculoskeletal: Neck supple.     Thyroid: No thyromegaly.  Cardiovascular:     Rate and Rhythm: Normal rate and regular rhythm.     Heart sounds: Normal heart sounds. No murmur.  Pulmonary:     Effort: Pulmonary effort is normal. No respiratory distress.     Breath sounds: Normal breath sounds. No wheezing.  Lymphadenopathy:     Cervical: No cervical adenopathy.  Skin:    General: Skin is warm and dry.      Findings: No rash.  Neurological:     Mental Status: He is alert and oriented to person, place, and time.     Coordination: Coordination normal.  Psychiatric:        Behavior: Behavior normal.         Assessment & Plan:   Problem List Items Addressed This Visit      Cardiovascular and Mediastinum   Essential hypertension   Relevant Medications   simvastatin (ZOCOR) 40 MG tablet   lisinopril-hydrochlorothiazide (PRINZIDE,ZESTORETIC) 20-25 MG tablet   Other Relevant Orders   CMP14+EGFR     Digestive   GERD     Endocrine   Thyroid disease - Primary   Relevant Medications   levothyroxine (SYNTHROID, LEVOTHROID) 150 MCG tablet   Other Relevant Orders   TSH     Other   Hyperlipidemia   Relevant Medications   simvastatin (ZOCOR) 40 MG tablet  lisinopril-hydrochlorothiazide (PRINZIDE,ZESTORETIC) 20-25 MG tablet   Other Relevant Orders   Lipid panel   Pain management contract signed   Relevant Orders   ToxASSURE Select 13 (MW), Urine      Continue current medication and pain management today. Follow up plan: Return in about 3 months (around 06/19/2018), or if symptoms worsen or fail to improve, for Chronic pain and cholesterol and GERD.  Counseling provided for all of the vaccine components Orders Placed This Encounter  Procedures  . ToxASSURE Select 13 (MW), Urine  . TSH  . CMP14+EGFR  . Lipid panel    Caryl Pina, MD [facility] 03/19/2018, 8:46 AM

## 2018-03-20 LAB — LIPID PANEL
Chol/HDL Ratio: 2.6 ratio (ref 0.0–5.0)
Cholesterol, Total: 160 mg/dL (ref 100–199)
HDL: 61 mg/dL (ref >39)
LDL Calculated: 75 mg/dL (ref 0–99)
Triglycerides: 119 mg/dL (ref 0–149)
VLDL CHOLESTEROL CAL: 24 mg/dL (ref 5–40)

## 2018-03-20 LAB — CMP14+EGFR
ALBUMIN: 4.4 g/dL (ref 3.7–4.7)
ALT: 12 IU/L (ref 0–44)
AST: 14 IU/L (ref 0–40)
Albumin/Globulin Ratio: 2 (ref 1.2–2.2)
Alkaline Phosphatase: 58 IU/L (ref 39–117)
BUN/Creatinine Ratio: 17 (ref 10–24)
BUN: 17 mg/dL (ref 8–27)
Bilirubin Total: 0.4 mg/dL (ref 0.0–1.2)
CO2: 22 mmol/L (ref 20–29)
Calcium: 9.7 mg/dL (ref 8.6–10.2)
Chloride: 102 mmol/L (ref 96–106)
Creatinine, Ser: 1.01 mg/dL (ref 0.76–1.27)
GFR calc non Af Amer: 73 mL/min/{1.73_m2} (ref >59)
GFR, EST AFRICAN AMERICAN: 85 mL/min/{1.73_m2} (ref >59)
Globulin, Total: 2.2 g/dL (ref 1.5–4.5)
Glucose: 96 mg/dL (ref 65–99)
Potassium: 4.4 mmol/L (ref 3.5–5.2)
Sodium: 140 mmol/L (ref 134–144)
TOTAL PROTEIN: 6.6 g/dL (ref 6.0–8.5)

## 2018-03-20 LAB — TSH: TSH: 2.1 u[IU]/mL (ref 0.450–4.500)

## 2018-03-23 LAB — TOXASSURE SELECT 13 (MW), URINE

## 2018-04-30 ENCOUNTER — Encounter (INDEPENDENT_AMBULATORY_CARE_PROVIDER_SITE_OTHER): Payer: Self-pay | Admitting: *Deleted

## 2018-06-21 ENCOUNTER — Encounter: Payer: Self-pay | Admitting: Family Medicine

## 2018-06-21 ENCOUNTER — Ambulatory Visit (INDEPENDENT_AMBULATORY_CARE_PROVIDER_SITE_OTHER): Payer: PPO | Admitting: Family Medicine

## 2018-06-21 ENCOUNTER — Other Ambulatory Visit: Payer: Self-pay

## 2018-06-21 DIAGNOSIS — G8929 Other chronic pain: Secondary | ICD-10-CM | POA: Diagnosis not present

## 2018-06-21 DIAGNOSIS — M1710 Unilateral primary osteoarthritis, unspecified knee: Secondary | ICD-10-CM | POA: Diagnosis not present

## 2018-06-21 MED ORDER — HYDROCODONE-ACETAMINOPHEN 10-325 MG PO TABS: 1.0000 | ORAL_TABLET | Freq: Four times a day (QID) | ORAL | 0 refills | Status: DC | PRN

## 2018-06-21 NOTE — Progress Notes (Signed)
Virtual Visit via telephone Note  I connected with [name] on 06/21/18 at [phone] by telephone and verified that I am speaking with the correct person using two identifiers. [name] is currently located at home and no other people are currently with her during visit. The provider, Fransisca Kaufmann Elzie Knisley, MD is located in their office at time of visit.  Call ended at [phone]  I discussed the limitations, risks, security and privacy concerns of performing an evaluation and management service by telephone and the availability of in person appointments. I also discussed with the patient that there may be a patient responsible charge related to this service. The patient expressed understanding and agreed to proceed.   History and Present Illness: Pain assessment: Cause of pain-knee arthritis and back pain Pain location-both knees and lower back due to arthritis Pain on scale of 1-10-2 Frequency-most days What increases pain-prolonged movement or standing What makes pain Better-resting and medication Effects on ADL -limit somewhat prolonged but does not limit him too much. Any change in general medical condition-none   Current opioids rx-hydrocodone 10-3 25 every 6 hours as needed # meds rx-120 Effectiveness of current meds-works well Adverse reactions form pain meds-none Morphine equivalent- 40  Pill count performed-No Last drug screen -March 2020 ( high risk q52m, moderate risk q1m, low risk yearly ) Urine drug screen today- No Was the [place] reviewed-yes  If yes were their any concerning findings? -None  No flowsheet data found.   Pain contract signed on: March 2020  No diagnosis found.  Outpatient Encounter Medications as of 06/21/2018  Medication Sig  . albuterol (PROAIR HFA) 108 (90 BASE) MCG/ACT inhaler Inhale 2 puffs into the lungs every 6 (six) hours as needed for wheezing or shortness of breath.  Marland Kitchen amLODipine (NORVASC) 10 MG tablet Take 1 tablet (10 mg total)  by mouth daily.  Marland Kitchen aspirin 325 MG tablet Take 325 mg by mouth daily.    Marland Kitchen atenolol (TENORMIN) 50 MG tablet TAKE ONE TABLET BY MOUTH ONE TIME DAILY  . furosemide (LASIX) 20 MG tablet Take 1 tablet (20 mg total) by mouth daily. Prn  . HYDROcodone-acetaminophen (NORCO) 10-325 MG tablet Take 1 tablet by mouth every 6 (six) hours as needed.  Marland Kitchen HYDROcodone-acetaminophen (NORCO) 10-325 MG tablet Take 1 tablet by mouth every 6 (six) hours as needed.  Marland Kitchen HYDROcodone-acetaminophen (NORCO) 10-325 MG tablet Take 1 tablet by mouth every 6 (six) hours as needed.  Marland Kitchen levothyroxine (SYNTHROID, LEVOTHROID) 150 MCG tablet Take 1 tablet (150 mcg total) by mouth daily.  Marland Kitchen lisinopril-hydrochlorothiazide (PRINZIDE,ZESTORETIC) 20-25 MG tablet Take 1 tablet by mouth daily.  . pantoprazole (PROTONIX) 40 MG tablet TAKE 1 TABLET ONCE A DAY  . sildenafil (REVATIO) 20 MG tablet Take 1-3 tablets (20-60 mg total) by mouth as needed.  . simvastatin (ZOCOR) 40 MG tablet Take 1 tablet (40 mg total) by mouth at bedtime.  . Tiotropium Bromide-Olodaterol (STIOLTO RESPIMAT) 2.5-2.5 MCG/ACT AERS Inhale 2 puffs into the lungs daily.   No facility-administered encounter medications on file as of 06/21/2018.     Review of Systems  Constitutional: Negative for chills and fever.  Eyes: Negative for discharge.  Respiratory: Negative for shortness of breath and wheezing.   Cardiovascular: Negative for chest pain and leg swelling.  Musculoskeletal: Positive for arthralgias and back pain. Negative for gait problem.  Skin: Negative for rash.  All other systems reviewed and are negative.   Observations/Objective: Patient sounds comfortable and in no acute distress  Assessment and Plan: Problem List Items Addressed This Visit      Musculoskeletal and Integument   Arthritis of knee   Relevant Medications   HYDROcodone-acetaminophen (NORCO) 10-325 MG tablet   HYDROcodone-acetaminophen (NORCO) 10-325 MG tablet    HYDROcodone-acetaminophen (NORCO) 10-325 MG tablet     Other   Encounter for chronic pain management - Primary   Relevant Medications   HYDROcodone-acetaminophen (NORCO) 10-325 MG tablet   HYDROcodone-acetaminophen (NORCO) 10-325 MG tablet   HYDROcodone-acetaminophen (NORCO) 10-325 MG tablet       Follow Up Instructions:  51-month follow-up   I discussed the assessment and treatment plan with the patient. The patient was provided an opportunity to ask questions and all were answered. The patient agreed with the plan and demonstrated an understanding of the instructions.   The patient was advised to call back or seek an in-person evaluation if the symptoms worsen or if the condition fails to improve as anticipated.  The above assessment and management plan was discussed with the patient. The patient verbalized understanding of and has agreed to the management plan. Patient is aware to call the clinic if symptoms persist or worsen. Patient is aware when to return to the clinic for a follow-up visit. Patient educated on when it is appropriate to go to the emergency department.    I provided 7 minutes of non-face-to-face time during this encounter.    Worthy Rancher, MD

## 2018-07-06 ENCOUNTER — Other Ambulatory Visit: Payer: Self-pay

## 2018-07-06 ENCOUNTER — Emergency Department (HOSPITAL_COMMUNITY)
Admission: EM | Admit: 2018-07-06 | Discharge: 2018-07-06 | Disposition: A | Discharge status: Home / Self Care | Payer: PPO | Attending: Emergency Medicine | Admitting: Emergency Medicine

## 2018-07-06 ENCOUNTER — Emergency Department (HOSPITAL_COMMUNITY): Payer: PPO

## 2018-07-06 ENCOUNTER — Encounter (HOSPITAL_COMMUNITY): Payer: Self-pay | Admitting: Emergency Medicine

## 2018-07-06 DIAGNOSIS — R079 Chest pain, unspecified: Secondary | ICD-10-CM | POA: Diagnosis not present

## 2018-07-06 DIAGNOSIS — Z96649 Presence of unspecified artificial hip joint: Secondary | ICD-10-CM | POA: Insufficient documentation

## 2018-07-06 DIAGNOSIS — Z955 Presence of coronary angioplasty implant and graft: Secondary | ICD-10-CM | POA: Insufficient documentation

## 2018-07-06 DIAGNOSIS — I10 Essential (primary) hypertension: Secondary | ICD-10-CM | POA: Insufficient documentation

## 2018-07-06 DIAGNOSIS — J449 Chronic obstructive pulmonary disease, unspecified: Secondary | ICD-10-CM | POA: Diagnosis not present

## 2018-07-06 DIAGNOSIS — R0789 Other chest pain: Secondary | ICD-10-CM | POA: Insufficient documentation

## 2018-07-06 DIAGNOSIS — R0602 Shortness of breath: Secondary | ICD-10-CM | POA: Diagnosis not present

## 2018-07-06 DIAGNOSIS — Z87891 Personal history of nicotine dependence: Secondary | ICD-10-CM | POA: Diagnosis not present

## 2018-07-06 DIAGNOSIS — Z7982 Long term (current) use of aspirin: Secondary | ICD-10-CM | POA: Insufficient documentation

## 2018-07-06 DIAGNOSIS — Z79899 Other long term (current) drug therapy: Secondary | ICD-10-CM | POA: Insufficient documentation

## 2018-07-06 DIAGNOSIS — E039 Hypothyroidism, unspecified: Secondary | ICD-10-CM | POA: Diagnosis not present

## 2018-07-06 LAB — BASIC METABOLIC PANEL
Anion gap: 10 (ref 5–15)
BUN: 17 mg/dL (ref 8–23)
CO2: 22 mmol/L (ref 22–32)
Calcium: 9 mg/dL (ref 8.9–10.3)
Chloride: 106 mmol/L (ref 98–111)
Creatinine, Ser: 1.15 mg/dL (ref 0.61–1.24)
GFR calc Af Amer: >60 mL/min (ref >60)
GFR calc non Af Amer: >60 mL/min (ref >60)
Glucose, Bld: 119 mg/dL — ABNORMAL HIGH (ref 70–99)
Potassium: 4.1 mmol/L (ref 3.5–5.1)
Sodium: 138 mmol/L (ref 135–145)

## 2018-07-06 LAB — CBC
HCT: 45.1 % (ref 39.0–52.0)
Hemoglobin: 15 g/dL (ref 13.0–17.0)
MCH: 31.8 pg (ref 26.0–34.0)
MCHC: 33.3 g/dL (ref 30.0–36.0)
MCV: 95.8 fL (ref 80.0–100.0)
Platelets: 256 10*3/uL (ref 150–400)
RBC: 4.71 MIL/uL (ref 4.22–5.81)
RDW: 12.6 % (ref 11.5–15.5)
WBC: 6.6 10*3/uL (ref 4.0–10.5)
nRBC: 0 % (ref 0.0–0.2)

## 2018-07-06 LAB — PROTIME-INR
INR: 1 (ref 0.8–1.2)
Prothrombin Time: 13.4 seconds (ref 11.4–15.2)

## 2018-07-06 LAB — TROPONIN I (HIGH SENSITIVITY)
Troponin I (High Sensitivity): <2 ng/L (ref <18)
Troponin I (High Sensitivity): <2 ng/L (ref <18)

## 2018-07-06 MED ORDER — SODIUM CHLORIDE 0.9% FLUSH: 3.0000 mL | Freq: Once | INTRAVENOUS | Status: DC

## 2018-07-06 NOTE — ED Triage Notes (Signed)
Pt with chest pain in the center of the chest non radiating today. States it feels like a "grip." Reports shortness of breath. Hx of stents.

## 2018-07-06 NOTE — ED Provider Notes (Signed)
[facility] EMERGENCY DEPARTMENT Provider Note   CSN: [id number] Arrival date & time: 07/06/18  1135    History   Chief Complaint Chief Complaint  Patient presents with  . Chest Pain    HPI [name] is a 73 y.o. male.     73yo M w/ PMH including CAD s/p stenting, COPD, hypothyroidism, HTN, HLD who p/w chest pain. 3 days ago he began having intermittent L sided chest pain that is non-exertional and sometimes tightness/pressure, sometimes sharp. It lasts for a few seconds at a time. He reports associated SOB but no nausea/vomiting or diaphoresis. No LE edema, new dyspnea on exertion, cough, fevers, or recent illness. He is compliant w/ meds and follows w/ Dr. Martinique, cardiology.  No smoking, alcohol or drug use.  The history is provided by the patient.  Chest Pain   Past Medical History:  Diagnosis Date  . Anginal pain ([place])   . Arthritis    OSTEO BACK KNEES HIPS & HANDS  . Arthropathy, unspecified, site unspecified   . Chronic airway obstruction, not elsewhere classified   . Coronary atherosclerosis of unspecified type of vessel, native or graft    a. LHC 5/16:  mLAD 20%, CFX stent ok, OM3 stent ok, mRCA 30%, normal LVF  . Headache(784.0)   . Hx of echocardiogram    a. Echo 5/16:  mild LVH, EF 60%, mildly dilated Ao root (40 mm)  . Hypothyroidism   . Other and unspecified hyperlipidemia   . Thyroid disease    hypothyroid  . Unspecified essential hypertension   . Unspecified sleep apnea    Dr. Jacelyn Grip at [facility] told pt he had sleep apnea, but never sent him for a sleep study so he doesn't wear a CPAP    Patient Active Problem List   Diagnosis Date Noted  . Encounter for chronic pain management 12/20/2016  . Pain management contract signed 03/08/2016  . Hyperlipidemia 10/29/2013  . Colon adenoma 04/08/2013  . Need for diphtheria-tetanus-pertussis (Tdap) vaccine, adult/adolescent 04/04/2013  . Need for prophylactic vaccination  against Streptococcus pneumoniae (pneumococcus) 12/24/2012  . Thyroid disease   . CAD (coronary artery disease)   . Bilateral lower extremity edema 09/06/2011  . GERD 09/18/2008  . Essential hypertension 05/01/2007  . Arthritis of knee 05/01/2007  . COPD (chronic obstructive pulmonary disease) ([place]) 05/01/2007  . Arthropathy 05/01/2007  . SLEEP APNEA 05/01/2007    Past Surgical History:  Procedure Laterality Date  . ANKLE SURGERY    . BALLOON DILATION N/A 05/02/2013   Procedure: BALLOON DILATION;  Surgeon: Rogene Houston, MD;  [facility];  Service: Endoscopy;  Laterality: N/A;  . CARDIAC CATHETERIZATION  01/15/2013  . CARDIAC CATHETERIZATION N/A 05/16/2014   Procedure: Left Heart Cath and Coronary Angiography;  Surgeon: Peter M Martinique, MD;  Location: [facility] CV LAB;  Service: Cardiovascular;  Laterality: N/A;  . COLONOSCOPY N/A 05/02/2013   Procedure: COLONOSCOPY;  Surgeon: Rogene Houston, MD;  Location: [facility];  Service: Endoscopy;  Laterality: N/A;  200-moved to 1200 Ann notified pt  . CORONARY STENT PLACEMENT     2003, 2004  . ESOPHAGOGASTRODUODENOSCOPY N/A 05/02/2013   Procedure: ESOPHAGOGASTRODUODENOSCOPY (EGD);  Surgeon: Rogene Houston, MD;  Location: [facility];  Service: Endoscopy;  Laterality: N/A;  . HEMORRHOID SURGERY    . HIP ARTHROPLASTY    . KNEE SURGERY Left 12/2013  . LEFT HEART CATHETERIZATION WITH CORONARY ANGIOGRAM N/A 01/15/2013   Procedure: LEFT HEART  CATHETERIZATION WITH CORONARY ANGIOGRAM;  Surgeon: Sinclair Grooms, MD;  Location: [facility] CATH LAB;  Service: Cardiovascular;  Laterality: N/A;  . MALONEY DILATION N/A 05/02/2013   Procedure: Venia Minks DILATION;  Surgeon: Rogene Houston, MD;  Location[facility];  Service: Endoscopy;  Laterality: N/A;  . SAVORY DILATION N/A 05/02/2013   Procedure: SAVORY DILATION;  Surgeon: Rogene Houston, MD;  Location: [facility];  Service: Endoscopy;  Laterality: N/A;  . TONSILLECTOMY           Home Medications    Prior to Admission medications   Medication Sig Start Date End Date Taking? Authorizing Provider  albuterol (PROAIR HFA) 108 (90 BASE) MCG/ACT inhaler Inhale 2 puffs into the lungs every 6 (six) hours as needed for wheezing or shortness of breath. 12/06/12  Yes Juanito Doom, MD  amLODipine (NORVASC) 10 MG tablet Take 1 tablet (10 mg total) by mouth daily. 01/01/18  Yes Dettinger, Fransisca Kaufmann, MD  aspirin 325 MG tablet Take 325 mg by mouth daily.     Yes [provider]  furosemide (LASIX) 20 MG tablet Take 1 tablet (20 mg total) by mouth daily. Prn Patient taking differently: Take 20 mg by mouth daily as needed for fluid or edema.  08/11/14  Yes Wardell Honour, MD  HYDROcodone-acetaminophen [facility]) 10-325 MG tablet Take 1 tablet by mouth every 6 (six) hours as needed. Patient taking differently: Take 1 tablet by mouth every 6 (six) hours as needed (for pain).  06/21/18  Yes Dettinger, Fransisca Kaufmann, MD  ibuprofen (ADVIL) 200 MG tablet Take 400 mg by mouth every 6 (six) hours as needed (for pain).   Yes [provider]  levothyroxine (SYNTHROID, LEVOTHROID) 150 MCG tablet Take 1 tablet (150 mcg total) by mouth daily. 03/19/18  Yes Dettinger, Fransisca Kaufmann, MD  lisinopril-hydrochlorothiazide (PRINZIDE,ZESTORETIC) 20-25 MG tablet Take 1 tablet by mouth daily. 03/19/18  Yes Dettinger, Fransisca Kaufmann, MD  naproxen sodium (ALEVE) 220 MG tablet Take 220-440 mg by mouth 2 (two) times daily as needed (for pain).   Yes [provider]  simvastatin (ZOCOR) 40 MG tablet Take 1 tablet (40 mg total) by mouth at bedtime. Patient taking differently: Take 40 mg by mouth daily.  03/19/18  Yes Dettinger, Fransisca Kaufmann, MD  atenolol (TENORMIN) 50 MG tablet TAKE ONE TABLET BY MOUTH ONE TIME DAILY Patient not taking: Reported on 07/06/2018 08/07/14   Wardell Honour, MD  HYDROcodone-acetaminophen [facility]) 10-325 MG tablet Take 1 tablet by mouth every 6 (six) hours as needed. Patient not taking:  Reported on 07/06/2018 06/21/18   Dettinger, Fransisca Kaufmann, MD  HYDROcodone-acetaminophen (NORCO) 10-325 MG tablet Take 1 tablet by mouth every 6 (six) hours as needed. Patient not taking: Reported on 07/06/2018 06/21/18   Dettinger, Fransisca Kaufmann, MD  pantoprazole (PROTONIX) 40 MG tablet TAKE 1 TABLET ONCE A DAY Patient not taking: Reported on 07/06/2018 07/31/15   Butch Penny, NP  sildenafil (REVATIO) 20 MG tablet Take 1-3 tablets (20-60 mg total) by mouth as needed. Patient taking differently: Take 20-60 mg by mouth as needed (as directed).  01/01/18   Dettinger, Fransisca Kaufmann, MD  Tiotropium Bromide-Olodaterol (STIOLTO RESPIMAT) 2.5-2.5 MCG/ACT AERS Inhale 2 puffs into the lungs daily. Patient not taking: Reported on 07/06/2018 05/19/14   Juanito Doom, MD    Family History Family History  Problem Relation Age of Onset  . Stroke Mother   . CAD Father        MI at  age 30  . Colon cancer Neg Hx     Social History Social History   Tobacco Use  . Smoking status: Former Smoker    Packs/day: 3.00    Years: 20.00    Pack years: 60.00    Types: Cigarettes    Quit date: 05/26/1975    Years since quitting: 43.1  . Smokeless tobacco: Current User    Types: Chew  Substance Use Topics  . Alcohol use: No  . Drug use: No     Allergies   Patient has no known allergies.   Review of Systems Review of Systems  Cardiovascular: Positive for chest pain.   All other systems reviewed and are negative except that which was mentioned in HPI   Physical Exam Updated Vital Signs BP 124/78   Pulse (!) 59   Temp 98.1 F (36.7 C) (Oral)   Resp 12   SpO2 95%   Physical Exam Vitals signs and nursing note reviewed.  Constitutional:      General: He is not in acute distress.    Appearance: He is well-developed.  HENT:     Head: Normocephalic and atraumatic.  Eyes:     Conjunctiva/sclera: Conjunctivae normal.  Neck:     Musculoskeletal: Neck supple.  Cardiovascular:     Rate and Rhythm: Normal  rate and regular rhythm.     Heart sounds: Normal heart sounds. No murmur.  Pulmonary:     Effort: Pulmonary effort is normal.     Breath sounds: Normal breath sounds.  Abdominal:     General: Bowel sounds are normal. There is no distension.     Palpations: Abdomen is soft.     Tenderness: There is no abdominal tenderness.  Musculoskeletal:     Right lower leg: No edema.     Left lower leg: No edema.  Skin:    General: Skin is warm and dry.  Neurological:     Mental Status: He is alert and oriented to person, place, and time.     Comments: Fluent speech  Psychiatric:        Judgment: Judgment normal.      ED Treatments / Results  Labs (all labs ordered are listed, but only abnormal results are displayed) Labs Reviewed  BASIC METABOLIC PANEL - Abnormal; Notable for the following components:      Result Value   Glucose, Bld 119 (*)    All other components within normal limits  CBC  TROPONIN I (HIGH SENSITIVITY)  TROPONIN I (HIGH SENSITIVITY)  PROTIME-INR    EKG EKG Interpretation  Date/Time:  Friday July 06 2018 11:42:54 EDT Ventricular Rate:  70 PR Interval:  168 QRS Duration: 96 QT Interval:  380 QTC Calculation: 410 R Axis:   -40 Text Interpretation:  Normal sinus rhythm Left axis deviation Incomplete right bundle branch block Possible Anterior infarct , age undetermined Abnormal ECG overall similar to previous Confirmed by Theotis Burrow [phone]) on 07/06/2018 3:00:10 PM   Radiology Dg Chest 2 View  Result Date: 07/06/2018 CLINICAL DATA:  Chest pain EXAM: CHEST - 2 VIEW COMPARISON:  12/25/2015 chest radiograph. FINDINGS: Stable cardiomediastinal silhouette with normal heart size. No pneumothorax. No pleural effusion. Minimal bibasilar atelectasis. No pulmonary edema. No consolidative airspace disease. IMPRESSION: Minimal bibasilar atelectasis. Electronically Signed   By: Ilona Sorrel M.D.   On: 07/06/2018 12:22    Procedures Procedures (including critical care  time)  Medications Ordered in ED Medications  sodium chloride flush (NS) 0.9 % injection 3 mL (  0 mLs Intravenous Hold 07/06/18 1440)     Initial Impression / Assessment and Plan / ED Course  I have reviewed the triage vital signs and the nursing notes.  Pertinent labs & imaging results that were available during my care of the patient were reviewed by me and considered in my medical decision making (see chart for details).       Well-appearing on exam, denies any pain.  Normal vital signs.  EKG without ischemic changes.  Chest x-ray clear.  His story is not suggestive of PE or aortic dissection.  Lab work shows reassuring basic labs, negative serial troponins.  Given his extensive cardiac history, discussed with cardiology Dr. Caryl Comes.  He felt that given normal troponins and atypical story with no exertional component to his symptoms, that patient can be discharged with follow-up with his cardiologist Dr. Martinique next week.  I have discussed this plan with the patient who feels comfortable with discharge.  I have extensively reviewed return precautions and he voiced understanding.  Final Clinical Impressions(s) / ED Diagnoses   Final diagnoses:  Atypical chest pain    ED Discharge Orders    None       Little, Wenda Overland, MD 07/06/18 1745

## 2018-07-06 NOTE — ED Notes (Signed)
Spoke with patient's family and provided update.

## 2018-07-06 NOTE — ED Notes (Signed)
Pt discharged with all belongings. Discharge instructions reviewed with pt, and pt verbalized understanding. Opportunity for questions provided.  

## 2018-07-06 NOTE — ED Notes (Signed)
Pt son Devonne [phone]

## 2018-07-08 NOTE — Progress Notes (Signed)
[name] Date of Birth: January 24, 1945 Medical Record #[id number]  History of Present Illness: [name] is seen to reestablish care for CAD. Last seen in November 2016.  He has a history of coronary disease. His initial cardiac event was in 2001. He had stenting of the third obtuse marginal vessel with a bare-metal stent. He had repeat cardiac catheterization in 2003 including intravascular ultrasound with no obstructive disease. In 2007 he presented with recurrent angina and had stenting of the proximal left circumflex with a Taxus stent.  In 2010 he had another cardiac catheterization. This demonstrated moderate disease in the RCA and 70%. His stents were still patent. He was treated medically.  He had a Lexiscan myoview study in Nov. 2014 which showed a small lateral fixed defect consistent with scar or artifact. No ischemia. EF 59%.  In May 2016 he presented with symptoms of chest pain and dyspnea with radiation to the jaw.  He was admitted and cardiac cath showed no obstructive disease. Stents were still patent. Chest CT and echo were unremarkable. He was also seeing Dr. Lake Bells for COPD/emphysema. Since his last visit with me he has been followed by primary care with good control of lipids and HTN.  He was seen in the ED on 07/06/18 with atypical chest pain. Ecg showed no acute changes and HS troponins were negative.   He states that on Tuesday he had some chest tightness while bending over doing some gardening. Wednesday he was watching TV and felt like a balloon was blowing up in his chest. This lasted a few seconds then went away. This happened again on Friday and awoke him from sleep. Again only lasted seconds.   He stays very active and has overall done well. He is the caregiver for his wife who has had multiple strokes and is blind.   Current Outpatient Medications on File Prior to Visit  Medication Sig Dispense Refill  . albuterol (PROAIR HFA) 108 (90 BASE) MCG/ACT inhaler  Inhale 2 puffs into the lungs every 6 (six) hours as needed for wheezing or shortness of breath. 1 Inhaler 2  . amLODipine (NORVASC) 10 MG tablet Take 1 tablet (10 mg total) by mouth daily. 90 tablet 3  . HYDROcodone-acetaminophen (NORCO) 10-325 MG tablet Take 1 tablet by mouth every 6 (six) hours as needed. (Patient taking differently: Take 1 tablet by mouth every 6 (six) hours as needed (for pain). ) 120 tablet 0  . levothyroxine (SYNTHROID, LEVOTHROID) 150 MCG tablet Take 1 tablet (150 mcg total) by mouth daily. 90 tablet 3  . lisinopril-hydrochlorothiazide (PRINZIDE,ZESTORETIC) 20-25 MG tablet Take 1 tablet by mouth daily. 90 tablet 3  . simvastatin (ZOCOR) 40 MG tablet Take 1 tablet (40 mg total) by mouth at bedtime. (Patient taking differently: Take 40 mg by mouth daily. ) 90 tablet 3   No current facility-administered medications on file prior to visit.     No Known Allergies  Past Medical History:  Diagnosis Date  . Anginal pain ([place])   . Arthritis    OSTEO BACK KNEES HIPS & HANDS  . Arthropathy, unspecified, site unspecified   . Chronic airway obstruction, not elsewhere classified   . Coronary atherosclerosis of unspecified type of vessel, native or graft    a. LHC 5/16:  mLAD 20%, CFX stent ok, OM3 stent ok, mRCA 30%, normal LVF  . Headache(784.0)   . Hx of echocardiogram    a. Echo 5/16:  mild LVH, EF 60%, mildly dilated Ao root (40  mm)  . Hypothyroidism   . Other and unspecified hyperlipidemia   . Thyroid disease    hypothyroid  . Unspecified essential hypertension   . Unspecified sleep apnea    Dr. Jacelyn Grip at [facility] told pt he had sleep apnea, but never sent him for a sleep study so he doesn't wear a CPAP    Past Surgical History:  Procedure Laterality Date  . ANKLE SURGERY    . BALLOON DILATION N/A 05/02/2013   Procedure: BALLOON DILATION;  Surgeon: Rogene Houston, MD;  [facility];  Service: Endoscopy;  Laterality: N/A;  . CARDIAC  CATHETERIZATION  01/15/2013  . CARDIAC CATHETERIZATION N/A 05/16/2014   Procedure: Left Heart Cath and Coronary Angiography;  Surgeon: Jamie Hafford M Martinique, MD;  Location: [place] CV LAB;  Service: Cardiovascular;  Laterality: N/A;  . COLONOSCOPY N/A 05/02/2013   Procedure: COLONOSCOPY;  Surgeon: Rogene Houston, MD;  [facility];  Service: Endoscopy;  Laterality: N/A;  200-moved to 1200 Ann notified pt  . CORONARY STENT PLACEMENT     2003, 2004  . ESOPHAGOGASTRODUODENOSCOPY N/A 05/02/2013   Procedure: ESOPHAGOGASTRODUODENOSCOPY (EGD);  Surgeon: Rogene Houston, MD;  [facility];  Service: Endoscopy;  Laterality: N/A;  . HEMORRHOID SURGERY    . HIP ARTHROPLASTY    . KNEE SURGERY Left 12/2013  . LEFT HEART CATHETERIZATION WITH CORONARY ANGIOGRAM N/A 01/15/2013   Procedure: LEFT HEART CATHETERIZATION WITH CORONARY ANGIOGRAM;  Surgeon: Sinclair Grooms, MD;  Location: [facility];  Service: Cardiovascular;  Laterality: N/A;  . MALONEY DILATION N/A 05/02/2013   Procedure: Venia Minks DILATION;  Surgeon: Rogene Houston, MD;  [facility];  Service: Endoscopy;  Laterality: N/A;  . SAVORY DILATION N/A 05/02/2013   Procedure: SAVORY DILATION;  Surgeon: Rogene Houston, MD;  [facility];  Service: Endoscopy;  Laterality: N/A;  . TONSILLECTOMY      Social History   Tobacco Use  Smoking Status Former Smoker  . Packs/day: 3.00  . Years: 20.00  . Pack years: 60.00  . Types: Cigarettes  . Quit date: 05/26/1975  . Years since quitting: 43.1  Smokeless Tobacco Current User  . Types: Chew    Social History   Substance and Sexual Activity  Alcohol Use No    Family History  Problem Relation Age of Onset  . Stroke Mother   . CAD Father        MI at age 69  . Colon cancer Neg Hx     Review of Systems: The review of systems is as noted in HPI.  All other systems were reviewed and are negative.  Physical Exam: BP (!) 146/90 (BP Location: Left Arm)    Pulse 72   Ht 5\' 11"  (1.803 m)   Wt 244 lb 3.2 oz (110.8 kg)   SpO2 95%   BMI 34.06 kg/m  He is a pleasant white male in mild  distress. HEENT: Normal. Neck: No JVD, adenopathy, thyromegaly, or bruits. Lungs: Clear Cardiovascular: Regular rate and rhythm. Normal S1 and S2. No gallop, murmur, or click. No chest wall pain to palpation. Abdomen: Soft and nontender. No masses or bruits. Bowel sounds are positive. Extremities: No cyanosis or edema. Pulses are 2+ and symmetric. Skin: Warm and dry Neuro: Alert and oriented x3. Cranial nerves II through XII are intact.  LABORATORY DATA: Lab Results  Component Value Date   WBC 6.6 07/06/2018   HGB 15.0 07/06/2018   HCT  45.1 07/06/2018   PLT 256 07/06/2018   GLUCOSE 119 (H) 07/06/2018   CHOL 160 03/19/2018   TRIG 119 03/19/2018   HDL 61 03/19/2018   LDLDIRECT 70.0 04/12/2006   LDLCALC 75 03/19/2018   ALT 12 03/19/2018   AST 14 03/19/2018   NA 138 07/06/2018   K 4.1 07/06/2018   CL 106 07/06/2018   CREATININE 1.15 07/06/2018   BUN 17 07/06/2018   CO2 22 07/06/2018   TSH 2.100 03/19/2018   INR 1.0 07/06/2018   HGBA1C  08/06/2008    5.8 (NOTE) The ADA recommends the following therapeutic goal for glycemic control related to Hgb A1c measurement: Goal of therapy: <6.5 Hgb A1c  Reference: American Diabetes Association: Clinical Practice Recommendations 2010, Diabetes Care, 2010, 33: (Suppl  1).    Ecg today shows NSR rate 72. Low voltage. I have personally reviewed and interpreted this study.   Assessment / Plan: 1. Coronary disease status post stenting of the third obtuse marginal vessel and 2003 with a bare-metal stent. Status post stenting of the proximal left circumflex in 2007 with a Taxus DES. Cardiac cath May 2016 showed patent stents with no obstructive CAD.  CT and Echo at that time also unremarkable. He is on good medical therapy. He has recent onset of chest pain with atypical features.  Recommend scheduling for a Lexiscan  Myoview study. I recommend he reduce ASA to 81 mg daily.   2. Hyperlipidemia. Continue statin therapy. Labs followed by primary care.  3. Hypertension-controlled.   4. COPD. Followed by Dr. Lake Bells.   5. Hypothyroidism. On replacement. TSH is normal.

## 2018-07-11 ENCOUNTER — Telehealth: Payer: Self-pay | Admitting: Cardiology

## 2018-07-11 NOTE — Telephone Encounter (Signed)

## 2018-07-12 ENCOUNTER — Encounter: Payer: Self-pay | Admitting: Cardiology

## 2018-07-12 ENCOUNTER — Other Ambulatory Visit: Payer: Self-pay

## 2018-07-12 ENCOUNTER — Ambulatory Visit (INDEPENDENT_AMBULATORY_CARE_PROVIDER_SITE_OTHER): Payer: PPO | Admitting: Cardiology

## 2018-07-12 VITALS — BP 146/90 | HR 72 | Ht 71.0 in | Wt 244.2 lb

## 2018-07-12 DIAGNOSIS — I25118 Atherosclerotic heart disease of native coronary artery with other forms of angina pectoris: Secondary | ICD-10-CM

## 2018-07-12 MED ORDER — ASPIRIN EC 81 MG PO TBEC: 81.0000 mg | DELAYED_RELEASE_TABLET | Freq: Every day | ORAL | 3 refills | Status: AC

## 2018-07-12 NOTE — Patient Instructions (Signed)
Reduce ASA to 81 mg daily  We will schedule you for a nuclear stress test

## 2018-07-25 ENCOUNTER — Telehealth (HOSPITAL_COMMUNITY): Payer: Self-pay

## 2018-07-25 NOTE — Telephone Encounter (Signed)
Encounter complete. 

## 2018-07-26 ENCOUNTER — Ambulatory Visit (HOSPITAL_COMMUNITY)
Admission: RE | Admit: 2018-07-26 | Discharge: 2018-07-26 | Disposition: A | Discharge status: Home / Self Care | Payer: PPO | Source: Ambulatory Visit | Attending: Cardiology | Admitting: Cardiology

## 2018-07-26 ENCOUNTER — Other Ambulatory Visit: Payer: Self-pay

## 2018-07-26 DIAGNOSIS — I25118 Atherosclerotic heart disease of native coronary artery with other forms of angina pectoris: Secondary | ICD-10-CM | POA: Insufficient documentation

## 2018-07-26 LAB — MYOCARDIAL PERFUSION IMAGING
LV dias vol: 111 mL (ref 62–150)
LV sys vol: 48 mL
Peak HR: 95 {beats}/min
Rest HR: 60 {beats}/min
SDS: 3
SRS: 7
SSS: 10
TID: 1.13

## 2018-07-26 MED ORDER — TECHNETIUM TC 99M TETROFOSMIN IV KIT
30.1000 | PACK | Freq: Once | INTRAVENOUS | Status: AC | PRN
  Administered 2018-07-26: 30.1 via INTRAVENOUS
  Filled 2018-07-26: qty 31

## 2018-07-26 MED ORDER — REGADENOSON 0.4 MG/5ML IV SOLN
0.4000 mg | Freq: Once | INTRAVENOUS | Status: AC
  Administered 2018-07-26: 0.4 mg via INTRAVENOUS

## 2018-07-26 MED ORDER — AMINOPHYLLINE 25 MG/ML IV SOLN
75.0000 mg | Freq: Once | INTRAVENOUS | Status: AC
  Administered 2018-07-26: 75 mg via INTRAVENOUS

## 2018-07-26 MED ORDER — TECHNETIUM TC 99M TETROFOSMIN IV KIT
10.6000 | PACK | Freq: Once | INTRAVENOUS | Status: AC | PRN
  Administered 2018-07-26: 10.6 via INTRAVENOUS
  Filled 2018-07-26: qty 11

## 2018-07-31 ENCOUNTER — Telehealth: Payer: Self-pay | Admitting: Cardiology

## 2018-07-31 NOTE — Telephone Encounter (Signed)
Spoke to patient results given.Results sent to PCP Dr.Joshua Dettinger.

## 2018-07-31 NOTE — Telephone Encounter (Signed)
Patient calling for results to his stress test.

## 2018-07-31 NOTE — Telephone Encounter (Signed)
Result Notes for Myocardial Perfusion Imaging  Notes recorded by Martinique, Peter M, MD on 07/26/2018 at 5:21 PM EDT  This study demonstrates: Myoview is low risk and unchanged from 2014. No ischemia. EF is good  Medication changes / Follow up studies / Other recommendations:   None reassure   Please send results to the PCP: Dettinger, Fransisca Kaufmann, MD  Peter Martinique, MD 07/26/2018 5:19 PM     Pt made aware of stress test results per Dr. Martinique.  Pt verbalized understanding and agrees with this plan. Will forward his results to his PCP.

## 2018-09-14 ENCOUNTER — Other Ambulatory Visit: Payer: Self-pay | Admitting: Family Medicine

## 2018-09-19 ENCOUNTER — Other Ambulatory Visit: Payer: Self-pay

## 2018-09-20 ENCOUNTER — Encounter: Payer: Self-pay | Admitting: Family Medicine

## 2018-09-20 ENCOUNTER — Ambulatory Visit (INDEPENDENT_AMBULATORY_CARE_PROVIDER_SITE_OTHER): Payer: PPO | Admitting: Family Medicine

## 2018-09-20 VITALS — BP 102/63 | HR 71 | Temp 98.9°F | Ht 71.0 in | Wt 239.0 lb

## 2018-09-20 DIAGNOSIS — Z0289 Encounter for other administrative examinations: Secondary | ICD-10-CM

## 2018-09-20 DIAGNOSIS — E079 Disorder of thyroid, unspecified: Secondary | ICD-10-CM | POA: Diagnosis not present

## 2018-09-20 DIAGNOSIS — I10 Essential (primary) hypertension: Secondary | ICD-10-CM | POA: Diagnosis not present

## 2018-09-20 DIAGNOSIS — G8929 Other chronic pain: Secondary | ICD-10-CM

## 2018-09-20 DIAGNOSIS — M1710 Unilateral primary osteoarthritis, unspecified knee: Secondary | ICD-10-CM | POA: Diagnosis not present

## 2018-09-20 DIAGNOSIS — Z23 Encounter for immunization: Secondary | ICD-10-CM

## 2018-09-20 MED ORDER — HYDROCODONE-ACETAMINOPHEN 10-325 MG PO TABS: 1.0000 | ORAL_TABLET | Freq: Four times a day (QID) | ORAL | 0 refills | Status: DC | PRN

## 2018-09-20 MED ORDER — SILDENAFIL CITRATE 20 MG PO TABS: 20.0000 mg | ORAL_TABLET | ORAL | 3 refills | Status: DC | PRN

## 2018-09-20 NOTE — Progress Notes (Signed)
BP 102/63   Pulse 71   Temp 98.9 F (37.2 C) (Temporal)   Ht 5' 11"  (1.803 m)   Wt 239 lb (108.4 kg)   SpO2 94%   BMI 33.33 kg/m    Subjective:   Patient ID: [name], male    DOB: [birth year], 73 y.o.   MRN: [id number]  HPI: [name] is a 73 y.o. male presenting on 09/20/2018 for Pain management   HPI Pain assessment: Cause of pain-chronic osteoarthritis of bilateral knees Pain location-both knees, worse left than right Pain on scale of 1-10- 2 Frequency-every other day What increases pain-stiff in the mornings, stiff on some days when he does too much work. What makes pain Better-usually movement and the medication days, also uses Tylenol on days he does not use the hydrocodone Effects on ADL -minimal limitations because of his knees except for that he is a lot slower he says. Any change in general medical condition-none  Current opioids rx-hydrocodone-acetaminophen 10-3 25 every 6 hours as needed # meds rx-120 Effectiveness of current meds-works well Adverse reactions form pain meds-none Morphine equivalent- 40  Pill count performed-No Last drug screen -March 2020 ( high risk q37m moderate risk q653mlow risk yearly ) Urine drug screen today- No Was [place]yes  If yes were their any concerning findings? -None  No flowsheet data found.   Pain contract signed on: March 2020  Hypothyroidism recheck Patient is coming in for thyroid recheck today as well. They deny any issues with hair changes or heat or cold problems or diarrhea or constipation. They deny any chest pain or palpitations. They are currently on levothyroxine 1553mograms   Hypertension Patient is currently on lisinopril hydrochlorothiazide and amlodipine, and their blood pressure today is 118/60 I want him to monitor closely for any signs of lightheadedness or dizziness we will back off if he does have any. Patient denies any lightheadedness or dizziness. Patient denies  headaches, blurred vision, chest pains, shortness of breath, or weakness. Denies any side effects from medication and is content with current medication.   [facility] 03/19/2018 01/01/2018 12/12/2017 09/25/2017 06/23/2017  Decreased Interest 0 0 0 0 0  Down, Depressed, Hopeless 0 0 0 0 0  PHQ - 2 Score 0 0 0 0 0     Relevant past medical, surgical, family and social history reviewed and updated as indicated. Interim medical history since our last visit reviewed. Allergies and medications reviewed and updated.  Review of Systems  Constitutional: Negative for chills and fever.  Eyes: Negative for visual disturbance.  Respiratory: Negative for shortness of breath and wheezing.   Cardiovascular: Negative for chest pain and leg swelling.  Musculoskeletal: Negative for back pain and gait problem.  Skin: Negative for rash.  Neurological: Negative for dizziness, weakness and light-headedness.  All other systems reviewed and are negative.   Per HPI unless specifically indicated above   Allergies as of 09/20/2018   No Known Allergies     Medication List       Accurate as of September 20, 2018  8:29 AM. If you have any questions, ask your nurse or doctor.        albuterol 108 (90 Base) MCG/ACT inhaler Commonly known as: ProAir HFA Inhale 2 puffs into the lungs every 6 (six) hours as needed for wheezing or shortness of breath.   amLODipine 10 MG tablet Commonly known as: NORVASC Take 1 tablet (10 mg total) by mouth daily.   aspirin  EC 81 MG tablet Take 1 tablet (81 mg total) by mouth daily.   HYDROcodone-acetaminophen 10-325 MG tablet Commonly known as: NORCO Take 1 tablet by mouth every 6 (six) hours as needed. What changed: reasons to take this   levothyroxine 150 MCG tablet Commonly known as: SYNTHROID Take 1 tablet (150 mcg total) by mouth daily.   lisinopril-hydrochlorothiazide 20-25 MG tablet Commonly known as: ZESTORETIC Take 1 tablet by mouth daily.    simvastatin 40 MG tablet Commonly known as: ZOCOR Take 1 tablet (40 mg total) by mouth at bedtime. What changed: when to take this        Objective:   BP 102/63   Pulse 71   Temp 98.9 F (37.2 C) (Temporal)   Ht 5' 11"  (1.803 m)   Wt 239 lb (108.4 kg)   SpO2 94%   BMI 33.33 kg/m   Wt Readings from Last 3 Encounters:  09/20/18 239 lb (108.4 kg)  07/26/18 244 lb (110.7 kg)  07/12/18 244 lb 3.2 oz (110.8 kg)    Physical Exam Vitals signs and nursing note reviewed.  Constitutional:      General: He is not in acute distress.    Appearance: He is well-developed. He is not diaphoretic.  Eyes:     General: No scleral icterus.    Conjunctiva/sclera: Conjunctivae normal.  Neck:     Musculoskeletal: Neck supple.     Thyroid: No thyromegaly.  Cardiovascular:     Rate and Rhythm: Normal rate and regular rhythm.     Heart sounds: Normal heart sounds. No murmur.  Pulmonary:     Effort: Pulmonary effort is normal. No respiratory distress.     Breath sounds: Normal breath sounds. No wheezing.  Musculoskeletal: Normal range of motion.  Lymphadenopathy:     Cervical: No cervical adenopathy.  Skin:    General: Skin is warm and dry.     Findings: No rash.  Neurological:     Mental Status: He is alert and oriented to person, place, and time.     Coordination: Coordination normal.  Psychiatric:        Behavior: Behavior normal.       Assessment & Plan:   Problem List Items Addressed This Visit      Cardiovascular and Mediastinum   Essential hypertension   Relevant Medications   sildenafil (REVATIO) 20 MG tablet   Other Relevant Orders   TSH     Endocrine   Thyroid disease   Relevant Orders   CMP14+EGFR     Musculoskeletal and Integument   Arthritis of knee   Relevant Medications   HYDROcodone-acetaminophen (NORCO) 10-325 MG tablet   HYDROcodone-acetaminophen (NORCO) 10-325 MG tablet   HYDROcodone-acetaminophen (NORCO) 10-325 MG tablet     Other   Pain  management contract signed - Primary   Relevant Medications   HYDROcodone-acetaminophen (NORCO) 10-325 MG tablet   HYDROcodone-acetaminophen (NORCO) 10-325 MG tablet   HYDROcodone-acetaminophen (NORCO) 10-325 MG tablet   Encounter for chronic pain management   Relevant Medications   HYDROcodone-acetaminophen (NORCO) 10-325 MG tablet   HYDROcodone-acetaminophen (NORCO) 10-325 MG tablet   HYDROcodone-acetaminophen (NORCO) 10-325 MG tablet      Continue hydrocodone, will check his thyroid and blood work for liver and kidneys today.  Patient will refill of Viagra so gave him a refill of the generic Follow up plan: Return in about 3 months (around 12/20/2018), or if symptoms worsen or fail to improve, for Chronic knee pain recheck.  Counseling provided for all of  the vaccine components No orders of the defined types were placed in this encounter.   Caryl Pina, MD [facility] 09/20/2018, 8:29 AM

## 2018-09-21 LAB — CMP14+EGFR
ALT: 13 IU/L (ref 0–44)
AST: 20 IU/L (ref 0–40)
Albumin/Globulin Ratio: 1.7 (ref 1.2–2.2)
Albumin: 4.3 g/dL (ref 3.7–4.7)
Alkaline Phosphatase: 48 IU/L (ref 39–117)
BUN/Creatinine Ratio: 15 (ref 10–24)
BUN: 15 mg/dL (ref 8–27)
Bilirubin Total: 0.3 mg/dL (ref 0.0–1.2)
CO2: 23 mmol/L (ref 20–29)
Calcium: 9 mg/dL (ref 8.6–10.2)
Chloride: 97 mmol/L (ref 96–106)
Creatinine, Ser: 1.03 mg/dL (ref 0.76–1.27)
GFR calc Af Amer: 83 mL/min/{1.73_m2} (ref >59)
GFR calc non Af Amer: 72 mL/min/{1.73_m2} (ref >59)
Globulin, Total: 2.5 g/dL (ref 1.5–4.5)
Glucose: 96 mg/dL (ref 65–99)
Potassium: 3.8 mmol/L (ref 3.5–5.2)
Sodium: 136 mmol/L (ref 134–144)
Total Protein: 6.8 g/dL (ref 6.0–8.5)

## 2018-09-21 LAB — TSH: TSH: 3.53 u[IU]/mL (ref 0.450–4.500)

## 2018-09-26 ENCOUNTER — Telehealth: Payer: Self-pay | Admitting: Family Medicine

## 2018-09-26 ENCOUNTER — Ambulatory Visit (INDEPENDENT_AMBULATORY_CARE_PROVIDER_SITE_OTHER): Payer: PPO | Admitting: Physician Assistant

## 2018-09-26 ENCOUNTER — Telehealth: Payer: Self-pay | Admitting: Physician Assistant

## 2018-09-26 DIAGNOSIS — F320 Major depressive disorder, single episode, mild: Secondary | ICD-10-CM

## 2018-09-26 DIAGNOSIS — J209 Acute bronchitis, unspecified: Secondary | ICD-10-CM

## 2018-09-26 MED ORDER — SERTRALINE HCL 50 MG PO TABS: 50.0000 mg | ORAL_TABLET | Freq: Every day | ORAL | 5 refills | Status: DC

## 2018-09-26 MED ORDER — AZITHROMYCIN 250 MG PO TABS: ORAL_TABLET | ORAL | 0 refills | Status: DC

## 2018-09-26 NOTE — Telephone Encounter (Signed)
Particia Nearing already messaged me about him, it sounds like she already sent him in the Zoloft.

## 2018-09-27 ENCOUNTER — Encounter: Payer: Self-pay | Admitting: Physician Assistant

## 2018-09-27 NOTE — Progress Notes (Signed)
Telephone visit  Subjective: CC: Upper respiratory symptoms PCP: Dettinger, Fransisca Kaufmann, MD [id number] [name] is a 73 y.o. male calls for telephone consult today. Patient provides verbal consent for consult held via phone.  Patient is identified with 2 separate identifiers.  At this time the entire area is on COVID-19 social distancing and stay home orders are in place.  Patient is of higher risk and therefore we are performing this by a virtual method.  Location of patient: Home Location of provider: HOME Others present for call: No   This patient has had many days of sore throat and postnasal drainage, headache at times and sinus pressure. There is copious drainage at times. Denies any fever at this time. There has been a history of sinus infections in the past.  There is cough at night. It has become more prevalent in recent days.  The patient does report that he had a influenza vaccine last week.  The patient states he is also thought about the discussion he and Dr. Warrick Parisian had concerning his depression.  He recently had lost his wife.  And they had talked about starting a medication he could not remember what it was.  I spoke with Dr. Warrick Parisian and Zoloft was 1 of the options they had discussed I have sent that medication in and the patient is aware.   ROS: Per HPI  No Known Allergies Past Medical History:  Diagnosis Date  . Anginal pain ([place])   . Arthritis    OSTEO BACK KNEES HIPS & HANDS  . Arthropathy, unspecified, site unspecified   . Chronic airway obstruction, not elsewhere classified   . Coronary atherosclerosis of unspecified type of vessel, native or graft    a. LHC 5/16:  mLAD 20%, CFX stent ok, OM3 stent ok, mRCA 30%, normal LVF  . Headache(784.0)   . Hx of echocardiogram    a. Echo 5/16:  mild LVH, EF 60%, mildly dilated Ao root (40 mm)  . Hypothyroidism   . Other and unspecified hyperlipidemia   . Thyroid disease    hypothyroid  . Unspecified essential  hypertension   . Unspecified sleep apnea    Dr. Jacelyn Grip at [facility] told pt he had sleep apnea, but never sent him for a sleep study so he doesn't wear a CPAP    Current Outpatient Medications:  .  albuterol (PROAIR HFA) 108 (90 BASE) MCG/ACT inhaler, Inhale 2 puffs into the lungs every 6 (six) hours as needed for wheezing or shortness of breath., Disp: 1 Inhaler, Rfl: 2 .  amLODipine (NORVASC) 10 MG tablet, Take 1 tablet (10 mg total) by mouth daily., Disp: 90 tablet, Rfl: 3 .  aspirin EC 81 MG tablet, Take 1 tablet (81 mg total) by mouth daily., Disp: 90 tablet, Rfl: 3 .  azithromycin (ZITHROMAX Z-PAK) 250 MG tablet, Take as directed, Disp: 6 each, Rfl: 0 .  HYDROcodone-acetaminophen (NORCO) 10-325 MG tablet, Take 1 tablet by mouth every 6 (six) hours as needed., Disp: 120 tablet, Rfl: 0 .  HYDROcodone-acetaminophen (NORCO) 10-325 MG tablet, Take 1 tablet by mouth every 6 (six) hours as needed. Do not refill until 60 days from prescription date, Disp: 120 tablet, Rfl: 0 .  HYDROcodone-acetaminophen (NORCO) 10-325 MG tablet, Take 1 tablet by mouth every 6 (six) hours as needed. Do not refill until 30 days from prescription date, Disp: 120 tablet, Rfl: 0 .  levothyroxine (SYNTHROID, LEVOTHROID) 150 MCG tablet, Take 1 tablet (150 mcg total) by mouth daily.,  Disp: 90 tablet, Rfl: 3 .  lisinopril-hydrochlorothiazide (PRINZIDE,ZESTORETIC) 20-25 MG tablet, Take 1 tablet by mouth daily., Disp: 90 tablet, Rfl: 3 .  sertraline (ZOLOFT) 50 MG tablet, Take 1 tablet (50 mg total) by mouth daily., Disp: 30 tablet, Rfl: 5 .  sildenafil (REVATIO) 20 MG tablet, Take 1-3 tablets (20-60 mg total) by mouth as needed., Disp: 30 tablet, Rfl: 3 .  simvastatin (ZOCOR) 40 MG tablet, Take 1 tablet (40 mg total) by mouth at bedtime. (Patient taking differently: Take 40 mg by mouth daily. ), Disp: 90 tablet, Rfl: 3  Assessment/ Plan: 73 y.o. male   1. Bronchitis with bronchospasm - azithromycin (ZITHROMAX Z-PAK)  250 MG tablet; Take as directed  Dispense: 6 each; Refill: 0  2. Depression, major, single episode, mild (HCC) - sertraline (ZOLOFT) 50 MG tablet; Take 1 tablet (50 mg total) by mouth daily.  Dispense: 30 tablet; Refill: 5   No follow-ups on file.  Continue all other maintenance medications as listed above.  Start time: 8:15 AM End time: 8:27 AM  Meds ordered this encounter  Medications  . azithromycin (ZITHROMAX Z-PAK) 250 MG tablet    Sig: Take as directed    Dispense:  6 each    Refill:  0    Order Specific Question:   Supervising Provider    Answer:   Janora Norlander [id number]  . sertraline (ZOLOFT) 50 MG tablet    Sig: Take 1 tablet (50 mg total) by mouth daily.    Dispense:  30 tablet    Refill:  5    Order Specific Question:   Supervising Provider    Answer:   Janora Norlander [id number]    Particia Nearing PA-C [place] [phone]

## 2018-10-01 ENCOUNTER — Ambulatory Visit (INDEPENDENT_AMBULATORY_CARE_PROVIDER_SITE_OTHER): Payer: PPO | Admitting: Family Medicine

## 2018-10-01 ENCOUNTER — Encounter: Payer: Self-pay | Admitting: Family Medicine

## 2018-10-01 DIAGNOSIS — T3695XA Adverse effect of unspecified systemic antibiotic, initial encounter: Secondary | ICD-10-CM

## 2018-10-01 DIAGNOSIS — K521 Toxic gastroenteritis and colitis: Secondary | ICD-10-CM

## 2018-10-01 MED ORDER — ONDANSETRON 4 MG PO TBDP: 4.0000 mg | ORAL_TABLET | Freq: Three times a day (TID) | ORAL | 0 refills | Status: DC | PRN

## 2018-10-01 MED ORDER — FAMOTIDINE 20 MG PO TABS: 20.0000 mg | ORAL_TABLET | Freq: Two times a day (BID) | ORAL | 1 refills | Status: DC

## 2018-10-01 NOTE — Progress Notes (Signed)
Virtual Visit via telephone Note  I connected with [name] on 10/01/18 at 1059 by telephone and verified that I am speaking with the correct person using two identifiers. [name] is currently located at home and no other people are currently with her during visit. The provider, Fransisca Kaufmann Dettinger, MD is located in their office at time of visit.  Call ended at 1108  I discussed the limitations, risks, security and privacy concerns of performing an evaluation and management service by telephone and the availability of in person appointments. I also discussed with the patient that there may be a patient responsible charge related to this service. The patient expressed understanding and agreed to proceed.   History and Present Illness: Patient is calling in for abdominal pain. He got a flu shot on 09/20/2018 and he got ill and low grade fever and chills and muscle aches and run down and he took a z-pak. He feels like his stomach is gassy and had vomiting and nausea and diarrhea over the past week. Now his stomach has been feeling bad since taking zpak. He denies urinary symptoms.  The respiratory symptoms are gone but the abdominal symptoms and diarrhea are new, he denies any blood diarrhea and is only a little bit and then he has the nausea and indigestion and he just had some vomiting earlier this morning when he woke from bed.  No diagnosis found.  Outpatient Encounter Medications as of 10/01/2018  Medication Sig  . albuterol (PROAIR HFA) 108 (90 BASE) MCG/ACT inhaler Inhale 2 puffs into the lungs every 6 (six) hours as needed for wheezing or shortness of breath.  Marland Kitchen amLODipine (NORVASC) 10 MG tablet Take 1 tablet (10 mg total) by mouth daily.  Marland Kitchen aspirin EC 81 MG tablet Take 1 tablet (81 mg total) by mouth daily.  Marland Kitchen azithromycin (ZITHROMAX Z-PAK) 250 MG tablet Take as directed  . HYDROcodone-acetaminophen (NORCO) 10-325 MG tablet Take 1 tablet by mouth every 6 (six) hours as  needed.  Marland Kitchen HYDROcodone-acetaminophen (NORCO) 10-325 MG tablet Take 1 tablet by mouth every 6 (six) hours as needed. Do not refill until 60 days from prescription date  . HYDROcodone-acetaminophen (NORCO) 10-325 MG tablet Take 1 tablet by mouth every 6 (six) hours as needed. Do not refill until 30 days from prescription date  . levothyroxine (SYNTHROID, LEVOTHROID) 150 MCG tablet Take 1 tablet (150 mcg total) by mouth daily.  Marland Kitchen lisinopril-hydrochlorothiazide (PRINZIDE,ZESTORETIC) 20-25 MG tablet Take 1 tablet by mouth daily.  . sertraline (ZOLOFT) 50 MG tablet Take 1 tablet (50 mg total) by mouth daily.  . sildenafil (REVATIO) 20 MG tablet Take 1-3 tablets (20-60 mg total) by mouth as needed.  . simvastatin (ZOCOR) 40 MG tablet Take 1 tablet (40 mg total) by mouth at bedtime. (Patient taking differently: Take 40 mg by mouth daily. )   No facility-administered encounter medications on file as of 10/01/2018.     Review of Systems  Constitutional: Negative for chills and fever.  Respiratory: Negative for shortness of breath and wheezing.   Cardiovascular: Negative for chest pain and leg swelling.  Gastrointestinal: Positive for abdominal distention, abdominal pain, diarrhea, nausea and vomiting.  Musculoskeletal: Negative for back pain and gait problem.  Skin: Negative for rash.  Neurological: Negative for dizziness, weakness and light-headedness.  All other systems reviewed and are negative.   Observations/Objective: Patient sounds comfortable and in no acute distress  Assessment and Plan: Problem List Items Addressed This Visit  None    Visit Diagnoses    Antibiotic enterocolitis syndrome    -  Primary   Relevant Medications   famotidine (PEPCID) 20 MG tablet   ondansetron (ZOFRAN ODT) 4 MG disintegrating tablet       Follow Up Instructions:  Follow-up as needed, if not improved give [place] call.  Try the medication for at least 2 weeks including the Pepcid and Zofran as needed    I discussed the assessment and treatment plan with the patient. The patient was provided an opportunity to ask questions and all were answered. The patient agreed with the plan and demonstrated an understanding of the instructions.   The patient was advised to call back or seek an in-person evaluation if the symptoms worsen or if the condition fails to improve as anticipated.  The above assessment and management plan was discussed with the patient. The patient verbalized understanding of and has agreed to the management plan. Patient is aware to call the clinic if symptoms persist or worsen. Patient is aware when to return to the clinic for a follow-up visit. Patient educated on when it is appropriate to go to the emergency department.    I provided 9 minutes of non-face-to-face time during this encounter.    Worthy Rancher, MD

## 2018-12-20 ENCOUNTER — Other Ambulatory Visit: Payer: Self-pay

## 2018-12-21 ENCOUNTER — Ambulatory Visit (INDEPENDENT_AMBULATORY_CARE_PROVIDER_SITE_OTHER): Payer: PPO | Admitting: Family Medicine

## 2018-12-21 ENCOUNTER — Encounter: Payer: Self-pay | Admitting: Family Medicine

## 2018-12-21 VITALS — BP 140/79 | HR 77 | Temp 97.3°F | Ht 71.0 in | Wt 231.6 lb

## 2018-12-21 DIAGNOSIS — G8929 Other chronic pain: Secondary | ICD-10-CM

## 2018-12-21 DIAGNOSIS — Z0289 Encounter for other administrative examinations: Secondary | ICD-10-CM

## 2018-12-21 DIAGNOSIS — I10 Essential (primary) hypertension: Secondary | ICD-10-CM | POA: Diagnosis not present

## 2018-12-21 DIAGNOSIS — E782 Mixed hyperlipidemia: Secondary | ICD-10-CM

## 2018-12-21 DIAGNOSIS — M1710 Unilateral primary osteoarthritis, unspecified knee: Secondary | ICD-10-CM | POA: Diagnosis not present

## 2018-12-21 DIAGNOSIS — K219 Gastro-esophageal reflux disease without esophagitis: Secondary | ICD-10-CM

## 2018-12-21 DIAGNOSIS — E079 Disorder of thyroid, unspecified: Secondary | ICD-10-CM

## 2018-12-21 MED ORDER — AMLODIPINE BESYLATE 10 MG PO TABS: 10.0000 mg | ORAL_TABLET | Freq: Every day | ORAL | 3 refills | Status: DC

## 2018-12-21 MED ORDER — OXYCODONE-ACETAMINOPHEN 7.5-325 MG PO TABS: 1.0000 | ORAL_TABLET | Freq: Four times a day (QID) | ORAL | 0 refills | Status: DC | PRN

## 2018-12-21 NOTE — Progress Notes (Signed)
BP 140/79   Pulse 77   Temp (!) 97.3 F (36.3 C) (Temporal)   Ht 5' 11"  (1.803 m)   Wt 231 lb 9.6 oz (105.1 kg)   SpO2 97%   BMI 32.30 kg/m    Subjective:   Patient ID: [name], male    DOB: 1945/05/09, 73 y.o.   MRN: [id number]  HPI: [name] is a 73 y.o. male presenting on 12/21/2018 for Medical Management of Chronic Issues (3 mth )   HPI Pain assessment: Cause of pain-arthritis in both the knees and his spine Pain location-back and knees Pain on scale of 1-10-8 Frequency-Daily What increases pain-movement and being on his feet What makes pain Better-hydrocodone Effects on ADL -limit him some on his ability to walk around Any change in general medical condition-none  Current opioids rx-hydrocodone but he says is not working so we will switch to oxycodone 7.5-325 every 6 hours as needed # meds rx-120 Effectiveness of current meds-hydrocodone not working so we will make adjustments Adverse reactions form pain meds-none Morphine equivalent-45  Pill count performed-No Last drug screen -03/20/2018 ( high risk q72m moderate risk q615mlow risk yearly ) Urine drug screen today- No Was the NCIrv[place]eviewed-yes  If yes were their any concerning findings? -None  No flowsheet data found.   Pain contract signed on: 03/20/2018  Hypertension Patient is currently on Denti-Pro hydrochlorothiazide and amlodipine, and their blood pressure today is 140/79. Patient denies any lightheadedness or dizziness. Patient denies headaches, blurred vision, chest pains, shortness of breath, or weakness. Denies any side effects from medication and is content with current medication.   Hyperlipidemia Patient is coming in for recheck of his hyperlipidemia. The patient is currently taking simvastatin. They deny any issues with myalgias or history of liver damage from it. They deny any focal numbness or weakness or chest pain.   Hypothyroidism recheck Patient is coming in for  thyroid recheck today as well. They deny any issues with hair changes or heat or cold problems or diarrhea or constipation. They deny any chest pain or palpitations. They are currently on levothyroxine 150 micrograms   GERD Patient is currently on famotidine.  She denies any major symptoms or abdominal pain or belching or burping. She denies any blood in her stool or lightheadedness or dizziness.   Relevant past medical, surgical, family and social history reviewed and updated as indicated. Interim medical history since our last visit reviewed. Allergies and medications reviewed and updated.  Review of Systems  Constitutional: Negative for chills and fever.  Eyes: Negative for visual disturbance.  Respiratory: Negative for shortness of breath and wheezing.   Cardiovascular: Negative for chest pain and leg swelling.  Musculoskeletal: Positive for arthralgias, back pain and myalgias. Negative for gait problem.  Skin: Negative for rash.  Neurological: Negative for dizziness, weakness, light-headedness and headaches.  All other systems reviewed and are negative.   Per HPI unless specifically indicated above   Allergies as of 12/21/2018   No Known Allergies     Medication List       Accurate as of December 21, 2018  9:05 AM. If you have any questions, ask your nurse or doctor.        STOP taking these medications   azithromycin 250 MG tablet Commonly known as: Zithromax Z-Pak Stopped by: JoWorthy RancherMD   HYDROcodone-acetaminophen 10-325 MG tablet Commonly known as: NORCO Stopped by: JoFransisca Kaufmannettinger, MD   ondansetron 4 MG disintegrating tablet Commonly  known as: Zofran ODT Stopped by: Worthy Rancher, MD     TAKE these medications   albuterol 108 (90 Base) MCG/ACT inhaler Commonly known as: ProAir HFA Inhale 2 puffs into the lungs every 6 (six) hours as needed for wheezing or shortness of breath.   amLODipine 10 MG tablet Commonly known as: NORVASC Take 1  tablet (10 mg total) by mouth daily.   aspirin EC 81 MG tablet Take 1 tablet (81 mg total) by mouth daily.   famotidine 20 MG tablet Commonly known as: Pepcid Take 1 tablet (20 mg total) by mouth 2 (two) times daily.   levothyroxine 150 MCG tablet Commonly known as: SYNTHROID Take 1 tablet (150 mcg total) by mouth daily.   lisinopril-hydrochlorothiazide 20-25 MG tablet Commonly known as: ZESTORETIC Take 1 tablet by mouth daily.   oxyCODONE-acetaminophen 7.5-325 MG tablet Commonly known as: Percocet Take 1 tablet by mouth every 6 (six) hours as needed for severe pain. Start taking on: December 25, 2018 Started by: Worthy Rancher, MD   oxyCODONE-acetaminophen 7.5-325 MG tablet Commonly known as: Percocet Take 1 tablet by mouth every 6 (six) hours as needed for severe pain. Start taking on: January 25, 2019 Started by: Fransisca Kaufmann Emy Angevine, MD   oxyCODONE-acetaminophen 7.5-325 MG tablet Commonly known as: Percocet Take 1 tablet by mouth every 6 (six) hours as needed for severe pain. Start taking on: February 25, 2019 Started by: Fransisca Kaufmann Tomasz Steeves, MD   sertraline 50 MG tablet Commonly known as: Zoloft Take 1 tablet (50 mg total) by mouth daily.   sildenafil 20 MG tablet Commonly known as: REVATIO Take 1-3 tablets (20-60 mg total) by mouth as needed.   simvastatin 40 MG tablet Commonly known as: ZOCOR Take 1 tablet (40 mg total) by mouth at bedtime. What changed: when to take this        Objective:   BP 140/79   Pulse 77   Temp (!) 97.3 F (36.3 C) (Temporal)   Ht 5' 11"  (1.803 m)   Wt 231 lb 9.6 oz (105.1 kg)   SpO2 97%   BMI 32.30 kg/m   Wt Readings from Last 3 Encounters:  12/21/18 231 lb 9.6 oz (105.1 kg)  09/20/18 239 lb (108.4 kg)  07/26/18 244 lb (110.7 kg)    Physical Exam Vitals and nursing note reviewed.  Constitutional:      General: He is not in acute distress.    Appearance: He is well-developed. He is not diaphoretic.  Eyes:      General: No scleral icterus.    Conjunctiva/sclera: Conjunctivae normal.  Neck:     Thyroid: No thyromegaly.  Cardiovascular:     Rate and Rhythm: Normal rate and regular rhythm.     Heart sounds: Normal heart sounds. No murmur.  Pulmonary:     Effort: Pulmonary effort is normal. No respiratory distress.     Breath sounds: Normal breath sounds. No wheezing.  Musculoskeletal:     Cervical back: Neck supple.  Lymphadenopathy:     Cervical: No cervical adenopathy.  Skin:    General: Skin is warm and dry.     Findings: No rash.  Neurological:     Mental Status: He is alert and oriented to person, place, and time.     Coordination: Coordination normal.  Psychiatric:        Behavior: Behavior normal.       Assessment & Plan:   Problem List Items Addressed This Visit  Cardiovascular and Mediastinum   Essential hypertension   Relevant Medications   amLODipine (NORVASC) 10 MG tablet   Other Relevant Orders   CMP14+EGFR     Digestive   GERD   Relevant Orders   CBC with Differential/Platelet     Endocrine   Thyroid disease - Primary   Relevant Orders   TSH     Musculoskeletal and Integument   Arthritis of knee   Relevant Medications   oxyCODONE-acetaminophen (PERCOCET) 7.5-325 MG tablet (Start on 02/25/2019)   oxyCODONE-acetaminophen (PERCOCET) 7.5-325 MG tablet (Start on 12/25/2018)   oxyCODONE-acetaminophen (PERCOCET) 7.5-325 MG tablet (Start on 01/25/2019)     Other   Hyperlipidemia   Relevant Medications   amLODipine (NORVASC) 10 MG tablet   Other Relevant Orders   Lipid panel   Pain management contract signed   Relevant Medications   oxyCODONE-acetaminophen (PERCOCET) 7.5-325 MG tablet (Start on 02/25/2019)   oxyCODONE-acetaminophen (PERCOCET) 7.5-325 MG tablet (Start on 12/25/2018)   oxyCODONE-acetaminophen (PERCOCET) 7.5-325 MG tablet (Start on 01/25/2019)   Encounter for chronic pain management   Relevant Medications   oxyCODONE-acetaminophen  (PERCOCET) 7.5-325 MG tablet (Start on 02/25/2019)   oxyCODONE-acetaminophen (PERCOCET) 7.5-325 MG tablet (Start on 12/25/2018)   oxyCODONE-acetaminophen (PERCOCET) 7.5-325 MG tablet (Start on 01/25/2019)      Switch from hydrocodone tens to oxycodone 7.5.  The milk limits is very similar but is enough of a switch that I think the transition will be good for him. Follow up plan: Return in about 3 months (around 03/21/2019), or if symptoms worsen or fail to improve, for Pain medication and hypertension recheck.  Counseling provided for all of the vaccine components Orders Placed This Encounter  Procedures  . CBC with Differential/Platelet  . TSH  . CMP14+EGFR  . Lipid panel    Caryl Pina, MD [facility] 12/21/2018, 9:05 AM

## 2018-12-22 LAB — TSH: TSH: 2.55 u[IU]/mL (ref 0.450–4.500)

## 2018-12-22 LAB — CMP14+EGFR
ALT: 8 IU/L (ref 0–44)
AST: 15 IU/L (ref 0–40)
Albumin/Globulin Ratio: 1.9 (ref 1.2–2.2)
Albumin: 4.2 g/dL (ref 3.7–4.7)
Alkaline Phosphatase: 55 IU/L (ref 39–117)
BUN/Creatinine Ratio: 15 (ref 10–24)
BUN: 13 mg/dL (ref 8–27)
Bilirubin Total: 0.5 mg/dL (ref 0.0–1.2)
CO2: 25 mmol/L (ref 20–29)
Calcium: 9.2 mg/dL (ref 8.6–10.2)
Chloride: 103 mmol/L (ref 96–106)
Creatinine, Ser: 0.86 mg/dL (ref 0.76–1.27)
GFR calc Af Amer: 99 mL/min/{1.73_m2} (ref >59)
GFR calc non Af Amer: 85 mL/min/{1.73_m2} (ref >59)
Globulin, Total: 2.2 g/dL (ref 1.5–4.5)
Glucose: 82 mg/dL (ref 65–99)
Potassium: 4.6 mmol/L (ref 3.5–5.2)
Sodium: 141 mmol/L (ref 134–144)
Total Protein: 6.4 g/dL (ref 6.0–8.5)

## 2018-12-22 LAB — CBC WITH DIFFERENTIAL/PLATELET
Basophils Absolute: 0 10*3/uL (ref 0.0–0.2)
Basos: 0 %
EOS (ABSOLUTE): 0.1 10*3/uL (ref 0.0–0.4)
Eos: 1 %
Hematocrit: 42.2 % (ref 37.5–51.0)
Hemoglobin: 14.6 g/dL (ref 13.0–17.7)
Immature Grans (Abs): 0 10*3/uL (ref 0.0–0.1)
Immature Granulocytes: 0 %
Lymphocytes Absolute: 1.9 10*3/uL (ref 0.7–3.1)
Lymphs: 26 %
MCH: 32.6 pg (ref 26.6–33.0)
MCHC: 34.6 g/dL (ref 31.5–35.7)
MCV: 94 fL (ref 79–97)
Monocytes Absolute: 0.9 10*3/uL (ref 0.1–0.9)
Monocytes: 12 %
Neutrophils Absolute: 4.5 10*3/uL (ref 1.4–7.0)
Neutrophils: 61 %
Platelets: 262 10*3/uL (ref 150–450)
RBC: 4.48 x10E6/uL (ref 4.14–5.80)
RDW: 13.1 % (ref 11.6–15.4)
WBC: 7.4 10*3/uL (ref 3.4–10.8)

## 2018-12-22 LAB — LIPID PANEL
Chol/HDL Ratio: 2.4 ratio (ref 0.0–5.0)
Cholesterol, Total: 142 mg/dL (ref 100–199)
HDL: 59 mg/dL (ref >39)
LDL Chol Calc (NIH): 64 mg/dL (ref 0–99)
Triglycerides: 105 mg/dL (ref 0–149)
VLDL Cholesterol Cal: 19 mg/dL (ref 5–40)

## 2019-01-24 ENCOUNTER — Other Ambulatory Visit: Payer: Self-pay | Admitting: Family Medicine

## 2019-02-05 NOTE — Progress Notes (Signed)
Virtual Visit via Telephone Note   This visit type was conducted due to national recommendations for restrictions regarding the COVID-19 Pandemic (e.g. social distancing) in an effort to limit this patient's exposure and mitigate transmission in our community.  Due to his co-morbid illnesses, this patient is at least at moderate risk for complications without adequate follow up.  This format is felt to be most appropriate for this patient at this time.  The patient did not have access to video technology/had technical difficulties with video requiring transitioning to audio format only (telephone).  All issues noted in this document were discussed and addressed.  No physical exam could be performed with this format.  Please refer to the patient's chart for his  consent to telehealth for [facility].   Date:  02/06/2019   ID:  [name] 1945-05-26, MRN [id number]  Patient Location: Home Provider Location: Home  PCP:  Dettinger, Fransisca Kaufmann, MD  Cardiologist:  Jaylynne Birkhead [place] MD Electrophysiologist:  None   Evaluation Performed:  Follow-Up Visit  Chief Complaint:  CAD  History of Present Illness:    [name] is a 74 y.o. male with history of CAD. His initial cardiac event was in 2001. He had stenting of the third obtuse marginal vessel with a bare-metal stent. He had repeat cardiac catheterization in 2003 including intravascular ultrasound with no obstructive disease. In 2007 he presented with recurrent angina and had stenting of the proximal left circumflex with a Taxus stent.  In 2010 he had another cardiac catheterization. This demonstrated moderate disease in the RCA and 70%. His stents were still patent. He was treated medically.  He had a Lexiscan myoview study in Nov. 2014 which showed a small lateral fixed defect consistent with scar or artifact. No ischemia. EF 59%.  In May 2016 he presented with symptoms of chest pain and dyspnea with radiation to the jaw.  He was  admitted and cardiac cath showed no obstructive disease. Stents were still patent. Chest CT and echo were unremarkable. He was also seeing Dr. Lake Bells for COPD/emphysema.   He was seen in the ED on 07/06/18 with atypical chest pain. Ecg showed no acute changes and HS troponins were negative. subsequent to that he had some brief episodes of chest pain. Myoview study was done and was low risk and unchanged from 2014.   He stays very active and has overall done well. He denies any chest pain or SOB. He did inform me that his wife passed away in 2022-10-23. She had been ill with history of multiple strokes.   The patient does not have symptoms concerning for COVID-19 infection (fever, chills, cough, or new shortness of breath).    Past Medical History:  Diagnosis Date  . Anginal pain ([place])   . Arthritis    OSTEO BACK KNEES HIPS & HANDS  . Arthropathy, unspecified, site unspecified   . Chronic airway obstruction, not elsewhere classified   . Coronary atherosclerosis of unspecified type of vessel, native or graft    a. LHC 5/16:  mLAD 20%, CFX stent ok, OM3 stent ok, mRCA 30%, normal LVF  . Headache(784.0)   . Hx of echocardiogram    a. Echo 5/16:  mild LVH, EF 60%, mildly dilated Ao root (40 mm)  . Hypothyroidism   . Other and unspecified hyperlipidemia   . Thyroid disease    hypothyroid  . Unspecified essential hypertension   . Unspecified sleep apnea    [facility] Jacelyn Grip at [facility]  told pt he had sleep apnea, but never sent him for a sleep study so he doesn't wear a CPAP   Past Surgical History:  Procedure Laterality Date  . ANKLE SURGERY    . BALLOON DILATION N/A 05/02/2013   Procedure: BALLOON DILATION;  Surgeon: Rogene Houston, MD;  [facility];  Service: Endoscopy;  Laterality: N/A;  . CARDIAC CATHETERIZATION  01/15/2013  . CARDIAC CATHETERIZATION N/A 05/16/2014   Procedure: Left Heart Cath and Coronary Angiography;  Surgeon: Janann Boeve M Martinique, MD;  Location: [place]  CV LAB;  Service: Cardiovascular;  Laterality: N/A;  . COLONOSCOPY N/A 05/02/2013   Procedure: COLONOSCOPY;  Surgeon: Rogene Houston, MD;  [facility];  Service: Endoscopy;  Laterality: N/A;  200-moved to 1200 Ann notified pt  . CORONARY STENT PLACEMENT     2003, 2004  . ESOPHAGOGASTRODUODENOSCOPY N/A 05/02/2013   Procedure: ESOPHAGOGASTRODUODENOSCOPY (EGD);  Surgeon: Rogene Houston, MD;  [facility];  Service: Endoscopy;  Laterality: N/A;  . HEMORRHOID SURGERY    . HIP ARTHROPLASTY    . KNEE SURGERY Left 12/2013  . LEFT HEART CATHETERIZATION WITH CORONARY ANGIOGRAM N/A 01/15/2013   Procedure: LEFT HEART CATHETERIZATION WITH CORONARY ANGIOGRAM;  Surgeon: Sinclair Grooms, MD;  Location: [facility] CATH LAB;  Service: Cardiovascular;  Laterality: N/A;  . MALONEY DILATION N/A 05/02/2013   Procedure: Venia Minks DILATION;  Surgeon: Rogene Houston, MD;  [facility];  Service: Endoscopy;  Laterality: N/A;  . SAVORY DILATION N/A 05/02/2013   Procedure: SAVORY DILATION;  Surgeon: Rogene Houston, MD;  [facility];  Service: Endoscopy;  Laterality: N/A;  . TONSILLECTOMY       Current Meds  Medication Sig  . albuterol (PROAIR HFA) 108 (90 BASE) MCG/ACT inhaler Inhale 2 puffs into the lungs every 6 (six) hours as needed for wheezing or shortness of breath.  Marland Kitchen amLODipine (NORVASC) 10 MG tablet Take 1 tablet (10 mg total) by mouth daily.  Marland Kitchen aspirin EC 81 MG tablet Take 1 tablet (81 mg total) by mouth daily.  Marland Kitchen levothyroxine (SYNTHROID) 150 MCG tablet Take 1 tablet (150 mcg total) by mouth daily.  Marland Kitchen lisinopril-hydrochlorothiazide (PRINZIDE,ZESTORETIC) 20-25 MG tablet Take 1 tablet by mouth daily.  Derrill Memo ON 02/25/2019] oxyCODONE-acetaminophen (PERCOCET) 7.5-325 MG tablet Take 1 tablet by mouth every 6 (six) hours as needed for severe pain.  Marland Kitchen sertraline (ZOLOFT) 50 MG tablet Take 1 tablet (50 mg total) by mouth daily.  . sildenafil (REVATIO) 20 MG tablet Take 1-3  tablets (20-60 mg total) by mouth as needed.  . simvastatin (ZOCOR) 40 MG tablet Take 1 tablet (40 mg total) by mouth at bedtime. (Patient taking differently: Take 40 mg by mouth daily. )  . [DISCONTINUED] famotidine (PEPCID) 20 MG tablet Take 1 tablet (20 mg total) by mouth 2 (two) times daily.     Allergies:   Patient has no known allergies.   Social History   Tobacco Use  . Smoking status: Former Smoker    Packs/day: 3.00    Years: 20.00    Pack years: 60.00    Types: Cigarettes    Quit date: 05/26/1975    Years since quitting: 43.7  . Smokeless tobacco: Current User    Types: Chew  Substance Use Topics  . Alcohol use: No  . Drug use: No     Family Hx: The patient's family history includes CAD in his father; Stroke in his mother. There is no  history of Colon cancer.  ROS:   Please see the history of present illness.     All other systems reviewed and are negative.   Prior CV studies:   The following studies were reviewed today:  Myoview 07/26/18:Study Highlights    The left ventricular ejection fraction is normal (55-65%).  Nuclear stress EF: 56%.  No T wave inversion was noted during stress.  There was no ST segment deviation noted during stress.  Defect 1: There is a large defect of moderate severity present in the basal inferoseptal, basal inferior, mid inferoseptal, mid inferior, apical septal and apical inferior location.  This is a low risk study.   Large size, moderate severity mostly fixed inferoseptal perfusion defect (SDS 2), suspect attenuation artifact. No reversible ischemia. LVEF 56% with normal wall motion. This is a low risk study.       Labs/Other Tests and Data Reviewed:    EKG:  No ECG reviewed.  Recent Labs: 12/21/2018: ALT 8; BUN 13; Creatinine, Ser 0.86; Hemoglobin 14.6; Platelets 262; Potassium 4.6; Sodium 141; TSH 2.550   Recent Lipid Panel Lab Results  Component Value Date/Time   CHOL 142 12/21/2018 09:05 AM   CHOL 160  04/16/2012 04:47 PM   TRIG 105 12/21/2018 09:05 AM   TRIG 250 (H) 04/04/2013 09:52 AM   TRIG 165 (H) 04/16/2012 04:47 PM   HDL 59 12/21/2018 09:05 AM   HDL 42 04/04/2013 09:52 AM   HDL 51 04/16/2012 04:47 PM   CHOLHDL 2.4 12/21/2018 09:05 AM   CHOLHDL 3.0 05/16/2014 01:15 AM   LDLCALC 64 12/21/2018 09:05 AM   LDLCALC 53 04/04/2013 09:52 AM   LDLCALC 76 04/16/2012 04:47 PM   LDLDIRECT 70.0 04/12/2006 10:50 AM    Wt Readings from Last 3 Encounters:  02/06/19 235 lb (106.6 kg)  12/21/18 231 lb 9.6 oz (105.1 kg)  09/20/18 239 lb (108.4 kg)     Objective:    Vital Signs:  Ht 6' (1.829 m)   Wt 235 lb (106.6 kg)   BMI 31.87 kg/m    VITAL SIGNS:  reviewed  ASSESSMENT & PLAN:    1. Coronary disease status post stenting of the third obtuse marginal vessel and 2003 with a bare-metal stent. Status post stenting of the proximal left circumflex in 2007 with a Taxus DES. Cardiac cath May 2016 showed patent stents with no obstructive CAD.  CT and Echo at that time also unremarkable. He is on good medical therapy. Myoview in July 2020 was low risk. He is asymptomatic.   2. Hyperlipidemia. Continue statin therapy. LDL is 64 which is at goal.   3. Hypertension-controlled.   4. COPD.  5. Hypothyroidism. On replacement. TSH is normal.  COVID-19 Education: The signs and symptoms of COVID-19 were discussed with the patient and how to seek care for testing (follow up with PCP or arrange E-visit).  The importance of social distancing was discussed today.  Time:   Today, I have spent 10 minutes with the patient with telehealth technology discussing the above problems.     Medication Adjustments/Labs and Tests Ordered: Current medicines are reviewed at length with the patient today.  Concerns regarding medicines are outlined above.   Tests Ordered: No orders of the defined types were placed in this encounter.   Medication Changes: No orders of the defined types were placed in this  encounter.   Follow Up:  In Person in 6 month(s)  Signed, Kelda Azad Martinique, MD  02/06/2019 9:38 AM  [facility]

## 2019-02-06 ENCOUNTER — Encounter: Payer: Self-pay | Admitting: Cardiology

## 2019-02-06 ENCOUNTER — Telehealth (INDEPENDENT_AMBULATORY_CARE_PROVIDER_SITE_OTHER): Payer: Medicare Other | Admitting: Cardiology

## 2019-02-06 VITALS — Ht 72.0 in | Wt 235.0 lb

## 2019-02-06 DIAGNOSIS — E782 Mixed hyperlipidemia: Secondary | ICD-10-CM

## 2019-02-06 DIAGNOSIS — I10 Essential (primary) hypertension: Secondary | ICD-10-CM

## 2019-02-06 DIAGNOSIS — I25118 Atherosclerotic heart disease of native coronary artery with other forms of angina pectoris: Secondary | ICD-10-CM

## 2019-02-06 NOTE — Patient Instructions (Signed)
Medication Instructions:  Continue same medications *If you need a refill on your cardiac medications before your next appointment, please call your pharmacy*  Lab Work: None ordered   Testing/Procedures: None ordered  Follow-Up: At [facility], you and your health needs are our priority.  As part of our continuing mission to provide you with exceptional heart care, we have created designated Provider Care Teams.  These Care Teams include your primary Cardiologist (physician) and Advanced Practice Providers (APPs -  Physician Assistants and Nurse Practitioners) who all work together to provide you with the care you need, when you need it.  Your next appointment:  6 months  Call in June to schedule August appointment   The format for your next appointment: Office   Provider:  Dr.Jordan   

## 2019-02-25 ENCOUNTER — Other Ambulatory Visit: Payer: Self-pay | Admitting: Family Medicine

## 2019-02-25 DIAGNOSIS — Z0289 Encounter for other administrative examinations: Secondary | ICD-10-CM

## 2019-02-25 DIAGNOSIS — M1710 Unilateral primary osteoarthritis, unspecified knee: Secondary | ICD-10-CM

## 2019-02-25 DIAGNOSIS — G8929 Other chronic pain: Secondary | ICD-10-CM

## 2019-03-15 ENCOUNTER — Other Ambulatory Visit: Payer: Self-pay | Admitting: Family Medicine

## 2019-03-25 ENCOUNTER — Other Ambulatory Visit: Payer: Self-pay | Admitting: Family Medicine

## 2019-03-25 DIAGNOSIS — G8929 Other chronic pain: Secondary | ICD-10-CM

## 2019-03-25 DIAGNOSIS — Z0289 Encounter for other administrative examinations: Secondary | ICD-10-CM

## 2019-03-25 DIAGNOSIS — M1710 Unilateral primary osteoarthritis, unspecified knee: Secondary | ICD-10-CM

## 2019-03-27 ENCOUNTER — Ambulatory Visit (INDEPENDENT_AMBULATORY_CARE_PROVIDER_SITE_OTHER): Payer: Medicare Other | Admitting: Family Medicine

## 2019-03-27 ENCOUNTER — Encounter: Payer: Self-pay | Admitting: Family Medicine

## 2019-03-27 ENCOUNTER — Other Ambulatory Visit: Payer: Self-pay

## 2019-03-27 VITALS — BP 107/63 | HR 73 | Temp 98.9°F | Ht 72.0 in | Wt 231.0 lb

## 2019-03-27 DIAGNOSIS — I10 Essential (primary) hypertension: Secondary | ICD-10-CM

## 2019-03-27 DIAGNOSIS — E782 Mixed hyperlipidemia: Secondary | ICD-10-CM

## 2019-03-27 DIAGNOSIS — Z0289 Encounter for other administrative examinations: Secondary | ICD-10-CM

## 2019-03-27 DIAGNOSIS — G8929 Other chronic pain: Secondary | ICD-10-CM

## 2019-03-27 DIAGNOSIS — M1710 Unilateral primary osteoarthritis, unspecified knee: Secondary | ICD-10-CM

## 2019-03-27 DIAGNOSIS — E079 Disorder of thyroid, unspecified: Secondary | ICD-10-CM

## 2019-03-27 MED ORDER — OXYCODONE-ACETAMINOPHEN 7.5-325 MG PO TABS: 1.0000 | ORAL_TABLET | Freq: Four times a day (QID) | ORAL | 0 refills | Status: DC | PRN

## 2019-03-27 NOTE — Progress Notes (Signed)
BP 107/63   Pulse 73   Temp 98.9 F (37.2 C)   Ht 6' (1.829 m)   Wt 231 lb (104.8 kg)   SpO2 95%   BMI 31.33 kg/m    Subjective:   Patient ID: [name], male    DOB: November 10, 1945, 74 y.o.   MRN: [id number]  HPI: [name] is a 74 y.o. male presenting on 03/27/2019 for Medical Management of Chronic Issues (Request refill of oxycodone today), Hypertension, and Hyperlipidemia   HPI Pain assessment: Cause of pain-left hip, chronic Pain location-left hip chronic arthritis Pain on scale of 1-10-5, worse with weather Frequency-Daily What increases pain-weather changes and pressure changes made a huge difference especially cold and wet weather like springtime What makes pain Better-medication helps Effects on ADL -limit some prolonged walking because he does have to rest more but does okay otherwise Any change in general medical condition-none  Current opioids rx- Percocet 7.5 mg 4 times daily as needed # meds rx-120 Effectiveness of current meds-works well, much better than the hydrocodone that we were on previously Adverse reactions form pain meds-none Morphine equivalent-45  Pill count performed-No Last drug screen -03/27/2019 ( high risk q30m moderate risk q626mlow risk yearly ) Urine drug screen today- Yes [place]  If yes were their any concerning findings? -None   Overdose risk: 140 Opioid Risk  03/27/2019  Alcohol 0  Illegal Drugs 0  Rx Drugs 0  Alcohol 0  Illegal Drugs 0  Rx Drugs 0  Age between 16-45 years  0  History of Preadolescent Sexual Abuse 0  Psychological Disease 0  Depression 1  Opioid Risk Tool Scoring 1  Opioid Risk Interpretation Low Risk     Pain contract signed on: 03/20/2018, repeat today  Hypertension Patient is currently on amlodipine and lisinopril hydrochlorothiazide, and their blood pressure today is 107/63. Patient denies any lightheadedness or dizziness. Patient denies headaches, blurred vision,  chest pains, shortness of breath, or weakness. Denies any side effects from medication and is content with current medication.   Hypothyroidism recheck Patient is coming in for thyroid recheck today as well. They deny any issues with hair changes or heat or cold problems or diarrhea or constipation. They deny any chest pain or palpitations. They are currently on levothyroxine 150 micrograms   Relevant past medical, surgical, family and social history reviewed and updated as indicated. Interim medical history since our last visit reviewed. Allergies and medications reviewed and updated.  Review of Systems  Constitutional: Negative for chills and fever.  Eyes: Negative for visual disturbance.  Respiratory: Negative for shortness of breath and wheezing.   Cardiovascular: Negative for chest pain and leg swelling.  Musculoskeletal: Negative for back pain and gait problem.  Skin: Negative for rash.  Neurological: Negative for dizziness, weakness and light-headedness.  All other systems reviewed and are negative.   Per HPI unless specifically indicated above   Allergies as of 03/27/2019   No Known Allergies     Medication List       Accurate as of March 27, 2019  8:57 AM. If you have any questions, ask your nurse or doctor.        albuterol 108 (90 Base) MCG/ACT inhaler Commonly known as: ProAir HFA Inhale 2 puffs into the lungs every 6 (six) hours as needed for wheezing or shortness of breath.   amLODipine 10 MG tablet Commonly known as: NORVASC Take 1 tablet (10 mg total) by mouth daily.  aspirin EC 81 MG tablet Take 1 tablet (81 mg total) by mouth daily.   levothyroxine 150 MCG tablet Commonly known as: SYNTHROID Take 1 tablet (150 mcg total) by mouth daily.   lisinopril-hydrochlorothiazide 20-25 MG tablet Commonly known as: ZESTORETIC Take 1 tablet by mouth daily.   oxyCODONE-acetaminophen 7.5-325 MG tablet Commonly known as: Percocet Take 1 tablet by mouth every 6  (six) hours as needed for severe pain.   sertraline 50 MG tablet Commonly known as: Zoloft Take 1 tablet (50 mg total) by mouth daily.   sildenafil 20 MG tablet Commonly known as: REVATIO Take 1-3 tablets (20-60 mg total) by mouth as needed.   simvastatin 40 MG tablet Commonly known as: ZOCOR Take 1 tablet (40 mg total) by mouth at bedtime.        Objective:   BP 107/63   Pulse 73   Temp 98.9 F (37.2 C)   Ht 6' (1.829 m)   Wt 231 lb (104.8 kg)   SpO2 95%   BMI 31.33 kg/m   Wt Readings from Last 3 Encounters:  03/27/19 231 lb (104.8 kg)  02/06/19 235 lb (106.6 kg)  12/21/18 231 lb 9.6 oz (105.1 kg)    Physical Exam Vitals and nursing note reviewed.  Constitutional:      General: He is not in acute distress.    Appearance: He is well-developed. He is not diaphoretic.  Eyes:     General: No scleral icterus.    Conjunctiva/sclera: Conjunctivae normal.  Neck:     Thyroid: No thyromegaly.  Cardiovascular:     Rate and Rhythm: Normal rate and regular rhythm.     Heart sounds: Normal heart sounds. No murmur.  Pulmonary:     Effort: Pulmonary effort is normal. No respiratory distress.     Breath sounds: Normal breath sounds. No wheezing.  Musculoskeletal:     Cervical back: Neck supple.  Lymphadenopathy:     Cervical: No cervical adenopathy.  Skin:    General: Skin is warm and dry.     Findings: No rash.  Neurological:     Mental Status: He is alert and oriented to person, place, and time.     Coordination: Coordination normal.  Psychiatric:        Behavior: Behavior normal.       Assessment & Plan:   Problem List Items Addressed This Visit      Cardiovascular and Mediastinum   Essential hypertension - Primary   Relevant Orders   CMP14+EGFR     Endocrine   Thyroid disease   Relevant Orders   TSH     Musculoskeletal and Integument   Arthritis of knee   Relevant Medications   oxyCODONE-acetaminophen (PERCOCET) 7.5-325 MG tablet    oxyCODONE-acetaminophen (PERCOCET) 7.5-325 MG tablet (Start on 05/26/2019)   oxyCODONE-acetaminophen (PERCOCET) 7.5-325 MG tablet (Start on 04/26/2019)     Other   Hyperlipidemia   Pain management contract signed   Relevant Medications   oxyCODONE-acetaminophen (PERCOCET) 7.5-325 MG tablet   Encounter for chronic pain management   Relevant Medications   oxyCODONE-acetaminophen (PERCOCET) 7.5-325 MG tablet    Patient has been out of his medication for 2 days so it may not be fully in the system but some of the metabolites might still show.  Continue other medication as is.  We will check blood work.  Follow up plan: Return in about 3 months (around 06/27/2019), or if symptoms worsen or fail to improve, for Pain medication and hypertension and thyroid.  Counseling provided for all of the vaccine components No orders of the defined types were placed in this encounter.   Caryl Pina, MD [facility] 03/27/2019, 8:57 AM

## 2019-03-27 NOTE — Addendum Note (Signed)
Addended by: Alphonzo Dublin on: 03/27/2019 10:22 AM   Modules accepted: Orders

## 2019-03-28 LAB — CMP14+EGFR
ALT: 11 IU/L (ref 0–44)
AST: 17 IU/L (ref 0–40)
Albumin/Globulin Ratio: 2 (ref 1.2–2.2)
Albumin: 4.3 g/dL (ref 3.7–4.7)
Alkaline Phosphatase: 59 IU/L (ref 39–117)
BUN/Creatinine Ratio: 16 (ref 10–24)
BUN: 17 mg/dL (ref 8–27)
Bilirubin Total: 0.5 mg/dL (ref 0.0–1.2)
CO2: 21 mmol/L (ref 20–29)
Calcium: 9.3 mg/dL (ref 8.6–10.2)
Chloride: 104 mmol/L (ref 96–106)
Creatinine, Ser: 1.05 mg/dL (ref 0.76–1.27)
GFR calc Af Amer: 80 mL/min/{1.73_m2} (ref >59)
GFR calc non Af Amer: 70 mL/min/{1.73_m2} (ref >59)
Globulin, Total: 2.1 g/dL (ref 1.5–4.5)
Glucose: 101 mg/dL — ABNORMAL HIGH (ref 65–99)
Potassium: 4.3 mmol/L (ref 3.5–5.2)
Sodium: 140 mmol/L (ref 134–144)
Total Protein: 6.4 g/dL (ref 6.0–8.5)

## 2019-03-28 LAB — TSH: TSH: 3.95 u[IU]/mL (ref 0.450–4.500)

## 2019-03-30 LAB — TOXASSURE SELECT 13 (MW), URINE

## 2019-04-02 ENCOUNTER — Telehealth: Payer: Self-pay | Admitting: Family Medicine

## 2019-04-02 NOTE — Chronic Care Management (AMB) (Signed)
  Chronic Care Management   Outreach Note  04/02/2019 Name: LIEF SPEROS MRN: [id number] DOB: Feb 24, 1945  VONN BUOL is a 74 y.o. year old male who is a primary care patient of Dettinger, Fransisca Kaufmann, MD. I reached out to [name] by phone today in response to a referral sent by Mr. Aloysuis Schoenbachler [facility] health plan.     An unsuccessful telephone outreach was attempted today. The patient was referred to the case management team for assistance with care management and care coordination.   Follow Up Plan: The care management team will reach out to the patient again over the next 7 days.  If patient returns call to provider office, please advise to call [facility] at [place], [place], [place], [place] [address] Direct Dial: [phone] [email or web address]

## 2019-04-11 ENCOUNTER — Ambulatory Visit (INDEPENDENT_AMBULATORY_CARE_PROVIDER_SITE_OTHER): Payer: Medicare Other | Admitting: *Deleted

## 2019-04-11 DIAGNOSIS — Z0000 Encounter for general adult medical examination without abnormal findings: Secondary | ICD-10-CM | POA: Diagnosis not present

## 2019-04-11 NOTE — Progress Notes (Signed)
MEDICARE ANNUAL WELLNESS VISIT  04/11/2019  Telephone Visit Disclaimer This Medicare AWV was conducted by telephone due to national recommendations for restrictions regarding the COVID-19 Pandemic (e.g. social distancing).  I verified, using two identifiers, that I am speaking with [name] or their authorized healthcare agent. I discussed the limitations, risks, security, and privacy concerns of performing an evaluation and management service by telephone and the potential availability of an in-person appointment in the future. The patient expressed understanding and agreed to proceed.   Subjective:  [name] is a 74 y.o. male patient of Dettinger, Fransisca Kaufmann, MD who had a Medicare Annual Wellness Visit today via telephone. [name] is Disabled and lives alone. he has 2 children. he reports that he is not socially active and does interact with friends/family regularly. he is not physically active and enjoys gardening and weights.  Patient Care Team: Dettinger, Fransisca Kaufmann, MD as PCP - General (Family Medicine) Martinique, Peter M, MD as Consulting Physician (Cardiology) Juanito Doom, MD as Consulting Physician (Pulmonary Disease)  Advanced Directives 04/11/2019 12/12/2017 12/07/2016 05/15/2014 05/02/2013 01/15/2013  Does Patient Have a Medical Advance Directive? No No No No Patient does not have advance directive;Patient would not like information Patient does not have advance directive  Would patient like information on creating a medical advance directive? No - Patient declined No - Patient declined No - Patient declined No - patient declined information - -  Pre-existing out of facility DNR order (yellow form or pink MOST form) - - - - No -    Hospital Utilization Over the Past 12 Months: # of hospitalizations or ER visits: 0 # of surgeries: 0  Review of Systems    Patient reports that his overall health is unchanged compared to last year.  History obtained from the  patient  Patient Reported Readings (BP, Pulse, CBG, Weight, etc) none  Pain Assessment Pain : No/denies pain     Current Medications & Allergies (verified) Allergies as of 04/11/2019   No Known Allergies     Medication List       Accurate as of April 11, 2019  9:07 AM. If you have any questions, ask your nurse or doctor.        albuterol 108 (90 Base) MCG/ACT inhaler Commonly known as: ProAir HFA Inhale 2 puffs into the lungs every 6 (six) hours as needed for wheezing or shortness of breath.   amLODipine 10 MG tablet Commonly known as: NORVASC Take 1 tablet (10 mg total) by mouth daily.   aspirin EC 81 MG tablet Take 1 tablet (81 mg total) by mouth daily.   levothyroxine 150 MCG tablet Commonly known as: SYNTHROID Take 1 tablet (150 mcg total) by mouth daily.   lisinopril-hydrochlorothiazide 20-25 MG tablet Commonly known as: ZESTORETIC Take 1 tablet by mouth daily.   oxyCODONE-acetaminophen 7.5-325 MG tablet Commonly known as: Percocet Take 1 tablet by mouth every 6 (six) hours as needed for severe pain.   oxyCODONE-acetaminophen 7.5-325 MG tablet Commonly known as: Percocet Take 1 tablet by mouth every 6 (six) hours as needed for severe pain. Start taking on: April 26, 2019   oxyCODONE-acetaminophen 7.5-325 MG tablet Commonly known as: Percocet Take 1 tablet by mouth every 6 (six) hours as needed for severe pain. Start taking on: May 26, 2019   sertraline 50 MG tablet Commonly known as: Zoloft Take 1 tablet (50 mg total) by mouth daily.   sildenafil 20 MG tablet Commonly known as: REVATIO  Take 1-3 tablets (20-60 mg total) by mouth as needed.   simvastatin 40 MG tablet Commonly known as: ZOCOR Take 1 tablet (40 mg total) by mouth at bedtime.       History (reviewed): Past Medical History:  Diagnosis Date  . Anginal pain ([place])   . Arthritis    OSTEO BACK KNEES HIPS & HANDS  . Arthropathy, unspecified, site unspecified   . Chronic airway  obstruction, not elsewhere classified   . Coronary atherosclerosis of unspecified type of vessel, native or graft    a. LHC 5/16:  mLAD 20%, CFX stent ok, OM3 stent ok, mRCA 30%, normal LVF  . Headache(784.0)   . Hx of echocardiogram    a. Echo 5/16:  mild LVH, EF 60%, mildly dilated Ao root (40 mm)  . Hypothyroidism   . Other and unspecified hyperlipidemia   . Thyroid disease    hypothyroid  . Unspecified essential hypertension   . Unspecified sleep apnea    [facility]. Jacelyn Grip at [facility] told pt he had sleep apnea, but never sent him for a sleep study so he doesn't wear a CPAP   Past Surgical History:  Procedure Laterality Date  . ANKLE SURGERY    . BALLOON DILATION N/A 05/02/2013   Procedure: BALLOON DILATION;  Surgeon: Rogene Houston, MD;  [facility];  Service: Endoscopy;  Laterality: N/A;  . CARDIAC CATHETERIZATION  01/15/2013  . CARDIAC CATHETERIZATION N/A 05/16/2014   Procedure: Left Heart Cath and Coronary Angiography;  Surgeon: Peter M Martinique, MD;  Location: [facility] CV LAB;  Service: Cardiovascular;  Laterality: N/A;  . COLONOSCOPY N/A 05/02/2013   Procedure: COLONOSCOPY;  Surgeon: Rogene Houston, MD;  [facility];  Service: Endoscopy;  Laterality: N/A;  200-moved to 1200 Ann notified pt  . CORONARY STENT PLACEMENT     2003, 2004  . ESOPHAGOGASTRODUODENOSCOPY N/A 05/02/2013   Procedure: ESOPHAGOGASTRODUODENOSCOPY (EGD);  Surgeon: Rogene Houston, MD;  [facility];  Service: Endoscopy;  Laterality: N/A;  . HEMORRHOID SURGERY    . HIP ARTHROPLASTY    . KNEE SURGERY Left 12/2013  . LEFT HEART CATHETERIZATION WITH CORONARY ANGIOGRAM N/A 01/15/2013   Procedure: LEFT HEART CATHETERIZATION WITH CORONARY ANGIOGRAM;  Surgeon: Sinclair Grooms, MD;  Location: [facility] CATH LAB;  Service: Cardiovascular;  Laterality: N/A;  . MALONEY DILATION N/A 05/02/2013   Procedure: Venia Minks DILATION;  Surgeon: Rogene Houston, MD;  [facility];  Service:  Endoscopy;  Laterality: N/A;  . SAVORY DILATION N/A 05/02/2013   Procedure: SAVORY DILATION;  Surgeon: Rogene Houston, MD;  [facility];  Service: Endoscopy;  Laterality: N/A;  . TONSILLECTOMY     Family History  Problem Relation Age of Onset  . Stroke Mother   . CAD Father        MI at age 11  . Colon cancer Neg Hx    Social History   Socioeconomic History  . Marital status: Widowed    Spouse name: Not on file  . Number of children: 2  . Years of education: Not on file  . Highest education level: 11th grade  Occupational History  . Occupation: Company secretary: Journalist, newspaper  . Occupation: Retired   Tobacco Use  . Smoking status: Former Smoker    Packs/day: 3.00    Years: 20.00    Pack years: 60.00    Types: Cigarettes    Quit date: 05/26/1975  Years since quitting: 43.9  . Smokeless tobacco: Current User    Types: Chew  Substance and Sexual Activity  . Alcohol use: No  . Drug use: No  . Sexual activity: Not Currently  Other Topics Concern  . 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 Grandson lives with him.   Social Determinants of Health   Financial Resource Strain: Low Risk   . Difficulty of Paying Living Expenses: Not hard at all  Food Insecurity: No Food Insecurity  . Worried About Charity fundraiser in the Last Year: Never true  . Ran Out of Food in the Last Year: Never true  Transportation Needs: No Transportation Needs  . Lack of Transportation (Medical): No  . Lack of Transportation (Non-Medical): No  Physical Activity: Inactive  . Days of Exercise per Week: 0 days  . Minutes of Exercise per Session: 0 min  Stress: No Stress Concern Present  . Feeling of Stress : Only a little  Social Connections: Moderately Isolated  . Frequency of Communication with Friends and Family: Three times a week  . Frequency of Social Gatherings with Friends  and Family: Not on file  . Attends Religious Services: Never  . Active Member of Clubs or Organizations: No  . Attends Archivist Meetings: Not on file  . Marital Status: Widowed    Activities of Daily Living In your present state of health, do you have any difficulty performing the following activities: 04/11/2019  Hearing? Y  Comment hard of hearing, had exam but couldn't afford hearing aids  Vision? N  Difficulty concentrating or making decisions? N  Walking or climbing stairs? N  Dressing or bathing? N  Doing errands, shopping? N  Preparing Food and eating ? N  Using the Toilet? N  In the past six months, have you accidently leaked urine? N  Do you have problems with loss of bowel control? N  Managing your Medications? N  Managing your Finances? N  Housekeeping or managing your Housekeeping? N  Some recent data might be hidden    Patient Education/ Literacy How often do you need to have someone help you when you read instructions, pamphlets, or other written materials from your doctor or pharmacy?: 1 - Never What is the last grade level you completed in school?: 11  Exercise Current Exercise Habits: The patient does not participate in regular exercise at present, Exercise limited by: None identified  Diet Patient reports consuming 3 meals a day and 2 snack(s) a day Patient reports that his primary diet is: Regular Patient reports that she does have regular access to food.   Depression Screen PHQ 2/9 Scores 04/11/2019 03/27/2019 12/21/2018 09/20/2018 03/19/2018 01/01/2018 12/12/2017  PHQ - 2 Score 0 0 2 - 0 0 0  PHQ- 9 Score - - 3 - - - -  Exception Documentation - - - Other- indicate reason in comment box - - -  Not completed - - - Patient wife just passed away last week - - -     Fall Risk Fall Risk  04/11/2019 03/27/2019 12/21/2018 09/20/2018 09/20/2018  Falls in the past year? 0 0 0 0 0     Objective:  [name] seemed alert and oriented and he  participated appropriately during our telephone visit.  Blood Pressure Weight BMI  BP Readings from Last 3 Encounters:  03/27/19 107/63  12/21/18 140/79  09/20/18  102/63   Wt Readings from Last 3 Encounters:  03/27/19 231 lb (104.8 kg)  02/06/19 235 lb (106.6 kg)  12/21/18 231 lb 9.6 oz (105.1 kg)   BMI Readings from Last 1 Encounters:  03/27/19 31.33 kg/m    *Unable to obtain current vital signs, weight, and BMI due to telephone visit type  Hearing/Vision  . [name] did  seem to have difficulty with hearing/understanding during the telephone conversation . Reports that he has not had a formal eye exam by an eye care professional within the past year . Reports that he has had a formal hearing evaluation within the past year *Unable to fully assess hearing and vision during telephone visit type  Cognitive Function: 6CIT Screen 04/11/2019 04/11/2019  What Year? 0 points 0 points  What month? 3 points 3 points  What time? 0 points 0 points  Count back from 20 0 points -  Months in reverse 2 points -  Repeat phrase 0 points -  Total Score 5 -   (Normal:0-7, Significant for Dysfunction: >8)  Normal Cognitive Function Screening: yes   Immunization & Health Maintenance Record Immunization History  Administered Date(s) Administered  . Fluad Quad(high Dose 65+) 09/20/2018  . Influenza Inj Mdck Quad With Preservative 09/29/2017  . Influenza Split 10/06/2012  . Influenza, High Dose Seasonal PF 09/14/2013  . Influenza,inj,Quad PF,6+ Mos 10/01/2014, 09/30/2016  . Influenza-Unspecified 10/07/2015, 10/02/2017  . Pneumococcal Conjugate-13 12/24/2012  . Pneumococcal Polysaccharide-23 10/02/2017  . Pneumococcal-Unspecified 10/02/2017  . Tdap 04/04/2013    Health Maintenance  Topic Date Due  . COLONOSCOPY  12/21/2019 (Originally 05/03/2018)  . Hepatitis C Screening  12/21/2019 (Originally [birth year])  . INFLUENZA VACCINE  08/04/2019  . TETANUS/TDAP  04/05/2023  . PNA vac Low Risk  Adult  Completed       Assessment  This is a routine wellness examination for [name].  Health Maintenance: Due or Overdue There are no preventive care reminders to display for this patient.  [name] does not need a referral for Community Assistance: Care Management:   no Social Work:    no Prescription Assistance:  no Nutrition/Diabetes Education:  no   Plan:  Personalized Goals Goals Addressed            This Visit's Progress   . DIET - INCREASE WATER INTAKE       Drink at least 6-8 glasses a day     . Increase physical activity (pt-stated)       Pt used to lift weights and workout until 3 years ago when his wife got sick and he was her caretaker. He would like to start slow and become more active again.      Personalized Health Maintenance & Screening Recommendations  up tp date  Lung Cancer Screening Recommended: no (Low Dose CT Chest recommended if Age 60-80 years, 30 pack-year currently smoking OR have quit w/in past 15 years) Hepatitis C Screening recommended: yes HIV Screening recommended: no  Advanced Directives: Written information was not prepared per patient's request.  Referrals & Orders No orders of the defined types were placed in this encounter.   Follow-up Plan . Follow-up with Dettinger, Fransisca Kaufmann, MD as planned on 07/01/19 . Pt will call and schedule an eye exam, he is due for new glasses. . Pt declined any advanced directives. . Health Maintenance is up to date. . Pt has been for a hearing exam and failed it. He could not afford hearing aids. . Patients wife  passes in September 2020, he was very active until she became sick and he was the main caregiver. He would like to start weightlifting again. . AVS printed and mailed today.    I have personally reviewed and noted the following in the patient's chart:   . Medical and social history . Use of alcohol, tobacco or illicit drugs  . Current medications and  supplements . Functional ability and status . Nutritional status . Physical activity . Advanced directives . List of other physicians . Hospitalizations, surgeries, and ER visits in previous 12 months . Vitals . Screenings to include cognitive, depression, and falls . Referrals and appointments  In addition, I have reviewed and discussed with [name] certain preventive protocols, quality metrics, and best practice recommendations. A written personalized care plan for preventive services as well as general preventive health recommendations is available and can be mailed to the patient at his request.      Faylene Million Charmaine,LPN  [id number]

## 2019-04-11 NOTE — Chronic Care Management (AMB) (Signed)
°  Chronic Care Management   Outreach Note  04/11/2019 Name: [name] MRN: [id number] DOB: 1945-06-21  [name] is a 74 y.o. year old male who is a primary care patient of Dettinger, Fransisca Kaufmann, MD. I reached out to [name] by phone today in response to a referral sent by [name] [facility] health plan.     A second unsuccessful telephone outreach was attempted today. The patient was referred to the case management team for assistance with care management and care coordination.   Follow Up Plan: The care management team will reach out to the patient again over the next 7 days.  If patient returns call to provider office, please advise to call [facility]  at [place], [place], [place], [place] [address] Direct Dial: [phone] [email or web address]: [email or web address]

## 2019-04-15 NOTE — Chronic Care Management (AMB) (Signed)
  Chronic Care Management   Outreach Note  04/15/2019 Name: [name] MRN: [id number] DOB: [birth year]  [name] is a 74 y.o. year old male who is a primary care patient of Dettinger, Fransisca Kaufmann, MD. I reached out to [name] by phone today in response to a referral sent by [name] [facility] health plan.     Third unsuccessful telephone outreach was attempted today. The patient was referred to the case management team for assistance with care management and care coordination. The patient's primary care provider has been notified of our unsuccessful attempts to make or maintain contact with the patient. The care management team is pleased to engage with this patient at any time in the future should he/she be interested in assistance from the care management team.   Follow Up Plan: The care management team is available to follow up with the patient after provider conversation with the patient regarding recommendation for care management engagement and subsequent re-referral to the care management team.   Noreene Larsson, [place], [place], [place] [address] Direct Dial: [phone] [email or web address]

## 2019-05-13 ENCOUNTER — Other Ambulatory Visit: Payer: Self-pay | Admitting: Family Medicine

## 2019-05-31 ENCOUNTER — Telehealth: Payer: Self-pay | Admitting: Family Medicine

## 2019-05-31 NOTE — Telephone Encounter (Signed)
Appointment rescheduled for 06/19/19 at 4:10 pm

## 2019-06-08 ENCOUNTER — Other Ambulatory Visit: Payer: Self-pay | Admitting: Family Medicine

## 2019-06-19 ENCOUNTER — Ambulatory Visit (INDEPENDENT_AMBULATORY_CARE_PROVIDER_SITE_OTHER): Payer: Medicare Other

## 2019-06-19 ENCOUNTER — Other Ambulatory Visit: Payer: Self-pay

## 2019-06-19 ENCOUNTER — Encounter: Payer: Self-pay | Admitting: Family Medicine

## 2019-06-19 ENCOUNTER — Ambulatory Visit (INDEPENDENT_AMBULATORY_CARE_PROVIDER_SITE_OTHER): Payer: Medicare Other | Admitting: Family Medicine

## 2019-06-19 VITALS — BP 123/67 | HR 70 | Temp 98.5°F | Ht 72.0 in | Wt 232.0 lb

## 2019-06-19 DIAGNOSIS — M542 Cervicalgia: Secondary | ICD-10-CM

## 2019-06-19 DIAGNOSIS — Z0289 Encounter for other administrative examinations: Secondary | ICD-10-CM

## 2019-06-19 DIAGNOSIS — G8929 Other chronic pain: Secondary | ICD-10-CM

## 2019-06-19 DIAGNOSIS — M546 Pain in thoracic spine: Secondary | ICD-10-CM | POA: Diagnosis not present

## 2019-06-19 MED ORDER — OXYCODONE-ACETAMINOPHEN 7.5-325 MG PO TABS: 1.0000 | ORAL_TABLET | Freq: Four times a day (QID) | ORAL | 0 refills | Status: DC | PRN

## 2019-06-19 NOTE — Progress Notes (Signed)
BP 123/67   Pulse 70   Temp 98.5 F (36.9 C)   Ht 6' (1.829 m)   Wt 232 lb (105.2 kg)   SpO2 97%   BMI 31.46 kg/m    Subjective:   Patient ID: [name], male    DOB: 02-May-1945, 74 y.o.   MRN: [id number]  HPI: [name] is a 74 y.o. male presenting on 06/19/2019 for Arthritis (request refill of oxycodone)   HPI Pain assessment: Cause of pain-arthritis in neck and upper back Pain location-both knees Pain on scale of 1-10- 4 Frequency-Daily What increases pain-standing or being up moving around What makes pain Better-resting or medication Effects on ADL -some limitations standing or prolonged walking or certain activities Any change in general medical condition-none  Current opioids rx-oxycodone-acetaminophen seven-point 5-325 milligrams every 6 hours as needed # meds rx-120 Effectiveness of current meds-works well except sometimes does not last the full time Adverse reactions form pain meds-none Morphine equivalent-45  Pill count performed-No Last drug screen -April 03, 2019 ( high risk q90m, moderate risk q12m, low risk yearly ) Urine drug screen today- No Was the [place] reviewed-yes  If yes were their any concerning findings? -None   Overdose risk: 150 Opioid Risk  03/27/2019  Alcohol 0  Illegal Drugs 0  Rx Drugs 0  Alcohol 0  Illegal Drugs 0  Rx Drugs 0  Age between 16-45 years  0  History of Preadolescent Sexual Abuse 0  Psychological Disease 0  Depression 1  Opioid Risk Tool Scoring 1  Opioid Risk Interpretation Low Risk     Pain contract signed on: 04/03/2019  Relevant past medical, surgical, family and social history reviewed and updated as indicated. Interim medical history since our last visit reviewed. Allergies and medications reviewed and updated.  Review of Systems  Constitutional: Negative for chills and fever.  Respiratory: Negative for shortness of breath and wheezing.   Cardiovascular: Negative for chest pain and leg  swelling.  Musculoskeletal: Positive for arthralgias, back pain and neck pain. Negative for gait problem.  Skin: Negative for rash.  All other systems reviewed and are negative.   Per HPI unless specifically indicated above   Allergies as of 06/19/2019   No Known Allergies     Medication List       Accurate as of June 19, 2019  4:19 PM. If you have any questions, ask your nurse or doctor.        albuterol 108 (90 Base) MCG/ACT inhaler Commonly known as: ProAir HFA Inhale 2 puffs into the lungs every 6 (six) hours as needed for wheezing or shortness of breath.   amLODipine 10 MG tablet Commonly known as: NORVASC Take 1 tablet (10 mg total) by mouth daily.   aspirin EC 81 MG tablet Take 1 tablet (81 mg total) by mouth daily.   levothyroxine 150 MCG tablet Commonly known as: SYNTHROID Take 1 tablet (150 mcg total) by mouth daily.   lisinopril-hydrochlorothiazide 20-25 MG tablet Commonly known as: ZESTORETIC Take 1 tablet by mouth daily.   oxyCODONE-acetaminophen 7.5-325 MG tablet Commonly known as: Percocet Take 1 tablet by mouth every 6 (six) hours as needed for severe pain. What changed: Another medication with the same name was changed. Make sure you understand how and when to take each. Changed by: Worthy Rancher, MD   oxyCODONE-acetaminophen 7.5-325 MG tablet Commonly known as: Percocet Take 1 tablet by mouth every 6 (six) hours as needed for severe pain. Start taking on: July 19, 2019 What changed: These instructions start on July 19, 2019. If you are unsure what to do until then, ask your doctor or other care provider. Changed by: Worthy Rancher, MD   oxyCODONE-acetaminophen 7.5-325 MG tablet Commonly known as: Percocet Take 1 tablet by mouth every 6 (six) hours as needed for severe pain. Start taking on: August 18, 2019 What changed: These instructions start on August 18, 2019. If you are unsure what to do until then, ask your doctor or other care  provider. Changed by: Fransisca Kaufmann Khaidyn Staebell, MD   sertraline 50 MG tablet Commonly known as: Zoloft Take 1 tablet (50 mg total) by mouth daily.   sildenafil 20 MG tablet Commonly known as: REVATIO Take 1-3 tablets (20-60 mg total) by mouth as needed.   simvastatin 40 MG tablet Commonly known as: ZOCOR TAKE 1 TABLET AT BEDTIME        Objective:   BP 123/67   Pulse 70   Temp 98.5 F (36.9 C)   Ht 6' (1.829 m)   Wt 232 lb (105.2 kg)   SpO2 97%   BMI 31.46 kg/m   Wt Readings from Last 3 Encounters:  06/19/19 232 lb (105.2 kg)  03/27/19 231 lb (104.8 kg)  02/06/19 235 lb (106.6 kg)    Physical Exam Vitals and nursing note reviewed.  Constitutional:      General: He is not in acute distress.    Appearance: He is well-developed. He is not diaphoretic.  Eyes:     General: No scleral icterus.    Conjunctiva/sclera: Conjunctivae normal.  Neck:     Thyroid: No thyromegaly.  Musculoskeletal:        General: Normal range of motion.     Cervical back: Spasms, tenderness, bony tenderness and crepitus present. No swelling, deformity, erythema or signs of trauma. No pain with movement. Normal range of motion.  Skin:    General: Skin is warm and dry.     Findings: No rash.  Neurological:     Mental Status: He is alert and oriented to person, place, and time.     Coordination: Coordination normal.  Psychiatric:        Behavior: Behavior normal.       Assessment & Plan:   Problem List Items Addressed This Visit      Other   Pain management contract signed   Relevant Medications   oxyCODONE-acetaminophen (PERCOCET) 7.5-325 MG tablet   oxyCODONE-acetaminophen (PERCOCET) 7.5-325 MG tablet (Start on 07/19/2019)   oxyCODONE-acetaminophen (PERCOCET) 7.5-325 MG tablet (Start on 08/18/2019)   Other Relevant Orders   DG Cervical Spine Complete   DG Thoracic Spine 2 View   MR Cervical Spine Wo Contrast   Encounter for chronic pain management   Relevant Medications    oxyCODONE-acetaminophen (PERCOCET) 7.5-325 MG tablet   oxyCODONE-acetaminophen (PERCOCET) 7.5-325 MG tablet (Start on 07/19/2019)   oxyCODONE-acetaminophen (PERCOCET) 7.5-325 MG tablet (Start on 08/18/2019)   Other Relevant Orders   DG Cervical Spine Complete   DG Thoracic Spine 2 View   MR Cervical Spine Wo Contrast    Other Visit Diagnoses    Chronic neck pain    -  Primary   Relevant Medications   oxyCODONE-acetaminophen (PERCOCET) 7.5-325 MG tablet   oxyCODONE-acetaminophen (PERCOCET) 7.5-325 MG tablet (Start on 07/19/2019)   oxyCODONE-acetaminophen (PERCOCET) 7.5-325 MG tablet (Start on 08/18/2019)   Other Relevant Orders   DG Cervical Spine Complete   DG Thoracic Spine 2 View   MR Cervical Spine Wo  Contrast      Continue medication but will get MRI to see if there is a reason why it is worsening all of a sudden. Follow up plan: Return in about 3 months (around 09/19/2019), or if symptoms worsen or fail to improve, for Thyroid and blood work and pain management.  Counseling provided for all of the vaccine components Orders Placed This Encounter  Procedures  . DG Cervical Spine Complete  . DG Thoracic Spine 2 View  . MR Cervical Spine Wo Contrast    Caryl Pina, MD [facility] 06/19/2019, 4:19 PM

## 2019-07-01 ENCOUNTER — Ambulatory Visit: Payer: Medicare Other | Admitting: Family Medicine

## 2019-07-18 ENCOUNTER — Ambulatory Visit (HOSPITAL_COMMUNITY)
Admission: RE | Admit: 2019-07-18 | Discharge: 2019-07-18 | Disposition: A | Discharge status: Home / Self Care | Payer: Medicare Other | Source: Ambulatory Visit | Attending: Family Medicine | Admitting: Family Medicine

## 2019-07-18 ENCOUNTER — Other Ambulatory Visit: Payer: Self-pay

## 2019-07-18 DIAGNOSIS — G8929 Other chronic pain: Secondary | ICD-10-CM | POA: Diagnosis not present

## 2019-07-18 DIAGNOSIS — M542 Cervicalgia: Secondary | ICD-10-CM | POA: Diagnosis not present

## 2019-07-18 DIAGNOSIS — Z0289 Encounter for other administrative examinations: Secondary | ICD-10-CM | POA: Diagnosis not present

## 2019-08-16 NOTE — Progress Notes (Signed)
[name] Date of Birth: 25-Apr-1945 Medical Record #[id number]  History of Present Illness: [name] is seen to reestablish care for CAD. Last seen in November 2016.  He has a history of coronary disease. His initial cardiac event was in 2001. He had stenting of the third obtuse marginal vessel with a bare-metal stent. He had repeat cardiac catheterization in 2003 including intravascular ultrasound with no obstructive disease. In 2007 he presented with recurrent angina and had stenting of the proximal left circumflex with a Taxus stent.  In 2010 he had another cardiac catheterization. This demonstrated moderate disease in the RCA and 70%. His stents were still patent. He was treated medically.  He had a Lexiscan myoview study in Nov. 2014 which showed a small lateral fixed defect consistent with scar or artifact. No ischemia. EF 59%.  In May 2016 he presented with symptoms of chest pain and dyspnea with radiation to the jaw.  He was admitted and cardiac cath showed no obstructive disease. Stents were still patent. Chest CT and echo were unremarkable. He was also seeing Dr. Lake Bells for COPD/emphysema. Since his last visit with me he has been followed by primary care with good control of lipids and HTN.  He was seen in the ED on 07/06/18 with atypical chest pain. Ecg showed no acute changes and HS troponins were negative.   He stays very active and has overall done well. He denies any chest pain or SOB. Generally feels well.   Current Outpatient Medications on File Prior to Visit  Medication Sig Dispense Refill   albuterol (PROAIR HFA) 108 (90 BASE) MCG/ACT inhaler Inhale 2 puffs into the lungs every 6 (six) hours as needed for wheezing or shortness of breath. 1 Inhaler 2   amLODipine (NORVASC) 10 MG tablet Take 1 tablet (10 mg total) by mouth daily. 90 tablet 3   aspirin EC 81 MG tablet Take 1 tablet (81 mg total) by mouth daily. 90 tablet 3   levothyroxine (SYNTHROID) 150 MCG tablet  Take 1 tablet (150 mcg total) by mouth daily. 90 tablet 1   lisinopril-hydrochlorothiazide (ZESTORETIC) 20-25 MG tablet Take 1 tablet by mouth daily. 90 tablet 0   oxyCODONE-acetaminophen (PERCOCET) 7.5-325 MG tablet Take 1 tablet by mouth every 6 (six) hours as needed for severe pain. 120 tablet 0   oxyCODONE-acetaminophen (PERCOCET) 7.5-325 MG tablet Take 1 tablet by mouth every 6 (six) hours as needed for severe pain. 120 tablet 0   oxyCODONE-acetaminophen (PERCOCET) 7.5-325 MG tablet Take 1 tablet by mouth every 6 (six) hours as needed for severe pain. 120 tablet 0   sertraline (ZOLOFT) 50 MG tablet Take 1 tablet (50 mg total) by mouth daily. 30 tablet 5   sildenafil (REVATIO) 20 MG tablet Take 1-3 tablets (20-60 mg total) by mouth as needed. 30 tablet 3   simvastatin (ZOCOR) 40 MG tablet TAKE 1 TABLET AT BEDTIME 90 tablet 0   No current facility-administered medications on file prior to visit.    No Known Allergies  Past Medical History:  Diagnosis Date   Anginal pain (HCC)    Arthritis    OSTEO BACK KNEES HIPS & HANDS   Arthropathy, unspecified, site unspecified    Chronic airway obstruction, not elsewhere classified    Coronary atherosclerosis of unspecified type of vessel, native or graft    a. LHC 5/16:  mLAD 20%, CFX stent ok, OM3 stent ok, mRCA 30%, normal LVF   Headache(784.0)    Hx of echocardiogram  a. Echo 5/16:  mild LVH, EF 60%, mildly dilated Ao root (40 mm)   Hypothyroidism    Other and unspecified hyperlipidemia    Thyroid disease    hypothyroid   Unspecified essential hypertension    Unspecified sleep apnea    Dr. Jacelyn Grip at [facility] told pt he had sleep apnea, but never sent him for a sleep study so he doesn't wear a CPAP    Past Surgical History:  Procedure Laterality Date   ANKLE SURGERY     BALLOON DILATION N/A 05/02/2013   Procedure: BALLOON DILATION;  Surgeon: Rogene Houston, MD;  [facility];  Service:  Endoscopy;  Laterality: N/A;   CARDIAC CATHETERIZATION  01/15/2013   CARDIAC CATHETERIZATION N/A 05/16/2014   Procedure: Left Heart Cath and Coronary Angiography;  Surgeon: Arius Harnois M Martinique, MD;  Location: [place] [facility];  Service: Cardiovascular;  Laterality: N/A;   COLONOSCOPY N/A 05/02/2013   Procedure: COLONOSCOPY;  Surgeon: Rogene Houston, MD;  Location: [facility];  Service: Endoscopy;  Laterality: N/A;  200-moved to 1200 Ann notified pt   CORONARY STENT PLACEMENT     2003, 2004   ESOPHAGOGASTRODUODENOSCOPY N/A 05/02/2013   Procedure: ESOPHAGOGASTRODUODENOSCOPY (EGD);  Surgeon: Rogene Houston, MD;  Location: [facility];  Service: Endoscopy;  Laterality: N/A;   HEMORRHOID SURGERY     HIP ARTHROPLASTY     KNEE SURGERY Left 12/2013   LEFT HEART CATHETERIZATION WITH CORONARY ANGIOGRAM N/A 01/15/2013   Procedure: LEFT HEART CATHETERIZATION WITH CORONARY ANGIOGRAM;  Surgeon: Sinclair Grooms, MD;  Location: [facility];  Service: Cardiovascular;  Laterality: N/A;   MALONEY DILATION N/A 05/02/2013   Procedure: Venia Minks DILATION;  Surgeon: Rogene Houston, MD;  Location: [facility];  Service: Endoscopy;  Laterality: N/A;   SAVORY DILATION N/A 05/02/2013   Procedure: SAVORY DILATION;  Surgeon: Rogene Houston, MD;  Location: [facility];  Service: Endoscopy;  Laterality: N/A;   TONSILLECTOMY      Social History   Tobacco Use  Smoking Status Former Smoker   Packs/day: 3.00   Years: 20.00   Pack years: 60.00   Types: Cigarettes   Quit date: 05/26/1975   Years since quitting: 44.2  Smokeless Tobacco Current User   Types: Chew    Social History   Substance and Sexual Activity  Alcohol Use No    Family History  Problem Relation Age of Onset   Stroke Mother    CAD Father        MI at age 46   Colon cancer Neg Hx     Review of Systems: The review of systems is as noted in HPI.  All other systems were reviewed and are negative.  Physical  Exam: BP 140/80    Pulse 63    Ht 6' (1.829 m)    Wt 232 lb 6.4 oz (105.4 kg)    SpO2 98%    BMI 31.52 kg/m  He is a pleasant white male in no  distress. HEENT: Normal. Neck: No JVD, adenopathy, thyromegaly, or bruits. Lungs: Clear Cardiovascular: Regular rate and rhythm. Normal S1 and S2. No gallop, murmur, or click. No chest wall pain to palpation. Abdomen: Soft and nontender. No masses or bruits. Bowel sounds are positive. Extremities: No cyanosis or edema. Pulses are 2+ and symmetric. Skin: Warm and dry Neuro: Alert and oriented x3. Cranial nerves II through XII are intact.  LABORATORY DATA: Lab Results  Component Value Date  WBC 7.4 12/21/2018   HGB 14.6 12/21/2018   HCT 42.2 12/21/2018   PLT 262 12/21/2018   GLUCOSE 101 (H) 03/27/2019   CHOL 142 12/21/2018   TRIG 105 12/21/2018   HDL 59 12/21/2018   LDLDIRECT 70.0 04/12/2006   LDLCALC 64 12/21/2018   ALT 11 03/27/2019   AST 17 03/27/2019   NA 140 03/27/2019   K 4.3 03/27/2019   CL 104 03/27/2019   CREATININE 1.05 03/27/2019   BUN 17 03/27/2019   CO2 21 03/27/2019   TSH 3.950 03/27/2019   INR 1.0 07/06/2018   HGBA1C  08/06/2008    5.8 (NOTE) The ADA recommends the following therapeutic goal for glycemic control related to Hgb A1c measurement: Goal of therapy: <6.5 Hgb A1c  Reference: American Diabetes Association: Clinical Practice Recommendations 2010, Diabetes Care, 2010, 33: (Suppl  1).    Ecg today shows NSR rate 63. Normal.  I have personally reviewed and interpreted this study.  Myoview 07/26/18: Study Highlights    The left ventricular ejection fraction is normal (55-65%).  Nuclear stress EF: 56%.  No T wave inversion was noted during stress.  There was no ST segment deviation noted during stress.  Defect 1: There is a large defect of moderate severity present in the basal inferoseptal, basal inferior, mid inferoseptal, mid inferior, apical septal and apical inferior location.  This is a low risk  study.   Large size, moderate severity mostly fixed inferoseptal perfusion defect (SDS 2), suspect attenuation artifact. No reversible ischemia. LVEF 56% with normal wall motion. This is a low risk study.   Assessment / Plan: 1. Coronary disease status post stenting of the third obtuse marginal vessel and 2003 with a bare-metal stent. Status post stenting of the proximal left circumflex in 2007 with a Taxus DES. Cardiac cath May 2016 showed patent stents with no obstructive CAD.  CT and Echo at that time also unremarkable. Myoview in 2020 was low risk. He is asymptomatic. He is on good medical therapy.   2. Hyperlipidemia. Continue statin therapy. Labs followed by primary care. Last LDL 64 is at goal  3. Hypertension-controlled.   4. COPD.   5. Hypothyroidism. On replacement. TSH is normal.  I encouraged him to get the COVID 19 vaccine.  Will follow up in one year.

## 2019-08-19 ENCOUNTER — Other Ambulatory Visit: Payer: Self-pay

## 2019-08-19 ENCOUNTER — Encounter: Payer: Self-pay | Admitting: Cardiology

## 2019-08-19 ENCOUNTER — Ambulatory Visit: Payer: Medicare Other | Admitting: Cardiology

## 2019-08-19 VITALS — BP 140/80 | HR 63 | Ht 72.0 in | Wt 232.4 lb

## 2019-08-19 DIAGNOSIS — I25118 Atherosclerotic heart disease of native coronary artery with other forms of angina pectoris: Secondary | ICD-10-CM

## 2019-08-19 DIAGNOSIS — E782 Mixed hyperlipidemia: Secondary | ICD-10-CM

## 2019-08-19 DIAGNOSIS — I10 Essential (primary) hypertension: Secondary | ICD-10-CM | POA: Diagnosis not present

## 2019-08-20 ENCOUNTER — Other Ambulatory Visit: Payer: Self-pay | Admitting: Family Medicine

## 2019-09-27 ENCOUNTER — Encounter: Payer: Self-pay | Admitting: Family Medicine

## 2019-09-27 ENCOUNTER — Ambulatory Visit (INDEPENDENT_AMBULATORY_CARE_PROVIDER_SITE_OTHER): Payer: Medicare Other | Admitting: Family Medicine

## 2019-09-27 ENCOUNTER — Other Ambulatory Visit: Payer: Self-pay

## 2019-09-27 VITALS — BP 129/70 | HR 82 | Temp 97.7°F | Ht 72.0 in | Wt 227.6 lb

## 2019-09-27 DIAGNOSIS — Z0289 Encounter for other administrative examinations: Secondary | ICD-10-CM

## 2019-09-27 DIAGNOSIS — E782 Mixed hyperlipidemia: Secondary | ICD-10-CM | POA: Diagnosis not present

## 2019-09-27 DIAGNOSIS — I10 Essential (primary) hypertension: Secondary | ICD-10-CM

## 2019-09-27 DIAGNOSIS — Z1159 Encounter for screening for other viral diseases: Secondary | ICD-10-CM | POA: Diagnosis not present

## 2019-09-27 DIAGNOSIS — M4712 Other spondylosis with myelopathy, cervical region: Secondary | ICD-10-CM

## 2019-09-27 DIAGNOSIS — G8929 Other chronic pain: Secondary | ICD-10-CM

## 2019-09-27 DIAGNOSIS — F320 Major depressive disorder, single episode, mild: Secondary | ICD-10-CM

## 2019-09-27 DIAGNOSIS — E079 Disorder of thyroid, unspecified: Secondary | ICD-10-CM

## 2019-09-27 MED ORDER — LISINOPRIL-HYDROCHLOROTHIAZIDE 20-25 MG PO TABS: 1.0000 | ORAL_TABLET | Freq: Every day | ORAL | 3 refills | Status: DC

## 2019-09-27 MED ORDER — OXYCODONE-ACETAMINOPHEN 7.5-325 MG PO TABS: 1.0000 | ORAL_TABLET | Freq: Four times a day (QID) | ORAL | 0 refills | Status: DC | PRN

## 2019-09-27 MED ORDER — SERTRALINE HCL 50 MG PO TABS: 50.0000 mg | ORAL_TABLET | Freq: Every day | ORAL | 3 refills | Status: DC

## 2019-09-27 MED ORDER — AMLODIPINE BESYLATE 10 MG PO TABS: 10.0000 mg | ORAL_TABLET | Freq: Every day | ORAL | 3 refills | Status: DC

## 2019-09-27 MED ORDER — LEVOTHYROXINE SODIUM 150 MCG PO TABS: 150.0000 ug | ORAL_TABLET | Freq: Every day | ORAL | 3 refills | Status: DC

## 2019-09-27 MED ORDER — SIMVASTATIN 40 MG PO TABS: 40.0000 mg | ORAL_TABLET | Freq: Every day | ORAL | 3 refills | Status: DC

## 2019-09-27 NOTE — Progress Notes (Signed)
BP 129/70   Pulse 82   Temp 97.7 F (36.5 C)   Ht 6' (1.829 m)   Wt 227 lb 9.6 oz (103.2 kg)   SpO2 97%   BMI 30.87 kg/m    Subjective:   Patient ID: [name], male    DOB: Jul 19, 1945, 74 y.o.   MRN: [id number]  HPI: [name] is a 74 y.o. male presenting on 09/27/2019 for Medical Management of Chronic Issues   HPI Pain assessment: Cause of pain-cervical disc disease and degeneration and bone spurs Pain location-neck and upper back Pain on scale of 1-10- 6 Frequency-Daily What increases pain-movements are working What makes pain Better-medication and rest Effects on ADL -does not limit him Any change in general medical condition-none  Current opioids rx-Percocet 7-1/2-325 every 6 hours as needed # meds rx-120/month Effectiveness of current meds-working but not as well as it was previously, over the past month he started to get used to it, recommended pill holiday for at least a few days Adverse reactions from pain meds-none Morphine equivalent-45  Pill count performed-No Last drug screen -04/03/2019 ( high risk q54m moderate risk q646mlow risk yearly ) Urine drug screen today- No Was the [place]yes  If yes were their any concerning findings? -None   Opioid Risk  03/27/2019  Alcohol 0  Illegal Drugs 0  Rx Drugs 0  Alcohol 0  Illegal Drugs 0  Rx Drugs 0  Age between 16-45 years  0  History of Preadolescent Sexual Abuse 0  Psychological Disease 0  Depression 1  Opioid Risk Tool Scoring 1  Opioid Risk Interpretation Low Risk     Pain contract signed on: 04/03/2019  Hypertension Patient is currently on amlodipine and lisinopril hydrochlorothiazide, and their blood pressure today is 129/70. Patient denies any lightheadedness or dizziness. Patient denies headaches, blurred vision, chest pains, shortness of breath, or weakness. Denies any side effects from medication and is content with current medication.   Hyperlipidemia Patient is  coming in for recheck of his hyperlipidemia. The patient is currently taking simvastatin. They deny any issues with myalgias or history of liver damage from it. They deny any focal numbness or weakness or chest pain.   Hypothyroidism recheck Patient is coming in for thyroid recheck today as well. They deny any issues with hair changes or heat or cold problems or diarrhea or constipation. They deny any chest pain or palpitations. They are currently on levothyroxine 150 micrograms   Relevant past medical, surgical, family and social history reviewed and updated as indicated. Interim medical history since our last visit reviewed. Allergies and medications reviewed and updated.  Review of Systems  Constitutional: Negative for chills and fever.  Eyes: Negative for discharge.  Respiratory: Negative for shortness of breath and wheezing.   Cardiovascular: Negative for chest pain and leg swelling.  Musculoskeletal: Positive for back pain and neck pain. Negative for gait problem and neck stiffness.  Skin: Negative for color change and rash.  Neurological: Negative for dizziness, weakness and numbness.  All other systems reviewed and are negative.   Per HPI unless specifically indicated above   Allergies as of 09/27/2019   No Known Allergies     Medication List       Accurate as of September 27, 2019 10:49 AM. If you have any questions, ask your nurse or doctor.        albuterol 108 (90 Base) MCG/ACT inhaler Commonly known as: ProAir HFA Inhale 2 puffs into the lungs  every 6 (six) hours as needed for wheezing or shortness of breath.   amLODipine 10 MG tablet Commonly known as: NORVASC Take 1 tablet (10 mg total) by mouth daily.   aspirin EC 81 MG tablet Take 1 tablet (81 mg total) by mouth daily.   levothyroxine 150 MCG tablet Commonly known as: SYNTHROID Take 1 tablet (150 mcg total) by mouth daily.   lisinopril-hydrochlorothiazide 20-25 MG tablet Commonly known as:  ZESTORETIC Take 1 tablet by mouth daily.   oxyCODONE-acetaminophen 7.5-325 MG tablet Commonly known as: Percocet Take 1 tablet by mouth every 6 (six) hours as needed for severe pain. What changed: Another medication with the same name was changed. Make sure you understand how and when to take each. Changed by: Worthy Rancher, MD   oxyCODONE-acetaminophen 7.5-325 MG tablet Commonly known as: Percocet Take 1 tablet by mouth every 6 (six) hours as needed for severe pain. Start taking on: October 26, 2019 What changed: These instructions start on October 26, 2019. If you are unsure what to do until then, ask your doctor or other care provider. Changed by: Worthy Rancher, MD   oxyCODONE-acetaminophen 7.5-325 MG tablet Commonly known as: Percocet Take 1 tablet by mouth every 6 (six) hours as needed for severe pain. Start taking on: November 26, 2019 What changed: These instructions start on November 26, 2019. If you are unsure what to do until then, ask your doctor or other care provider. Changed by: Fransisca Kaufmann Liani Caris, MD   sertraline 50 MG tablet Commonly known as: Zoloft Take 1 tablet (50 mg total) by mouth daily.   sildenafil 20 MG tablet Commonly known as: REVATIO Take 1-3 tablets (20-60 mg total) by mouth as needed.   simvastatin 40 MG tablet Commonly known as: ZOCOR Take 1 tablet (40 mg total) by mouth at bedtime.        Objective:   BP 129/70   Pulse 82   Temp 97.7 F (36.5 C)   Ht 6' (1.829 m)   Wt 227 lb 9.6 oz (103.2 kg)   SpO2 97%   BMI 30.87 kg/m   Wt Readings from Last 3 Encounters:  09/27/19 227 lb 9.6 oz (103.2 kg)  08/19/19 232 lb 6.4 oz (105.4 kg)  06/19/19 232 lb (105.2 kg)    Physical Exam Vitals and nursing note reviewed.  Constitutional:      General: He is not in acute distress.    Appearance: He is well-developed. He is not diaphoretic.  Eyes:     General: No scleral icterus.    Conjunctiva/sclera: Conjunctivae normal.  Neck:      Thyroid: No thyromegaly.  Cardiovascular:     Rate and Rhythm: Normal rate and regular rhythm.     Heart sounds: Normal heart sounds. No murmur heard.   Pulmonary:     Effort: Pulmonary effort is normal. No respiratory distress.     Breath sounds: Normal breath sounds. No wheezing.  Musculoskeletal:        General: Normal range of motion.     Cervical back: Neck supple.  Lymphadenopathy:     Cervical: No cervical adenopathy.  Skin:    General: Skin is warm and dry.     Findings: No rash.  Neurological:     Mental Status: He is alert and oriented to person, place, and time.     Coordination: Coordination normal.  Psychiatric:        Behavior: Behavior normal.       Assessment & Plan:  Problem List Items Addressed This Visit      Cardiovascular and Mediastinum   Essential hypertension   Relevant Medications   simvastatin (ZOCOR) 40 MG tablet   lisinopril-hydrochlorothiazide (ZESTORETIC) 20-25 MG tablet   amLODipine (NORVASC) 10 MG tablet   Other Relevant Orders   CBC with Differential/Platelet   CMP14+EGFR     Endocrine   Thyroid disease   Relevant Medications   levothyroxine (SYNTHROID) 150 MCG tablet   Other Relevant Orders   CBC with Differential/Platelet   TSH     Other   Hyperlipidemia - Primary   Relevant Medications   simvastatin (ZOCOR) 40 MG tablet   lisinopril-hydrochlorothiazide (ZESTORETIC) 20-25 MG tablet   amLODipine (NORVASC) 10 MG tablet   Other Relevant Orders   Lipid panel   Pain management contract signed   Relevant Medications   oxyCODONE-acetaminophen (PERCOCET) 7.5-325 MG tablet   oxyCODONE-acetaminophen (PERCOCET) 7.5-325 MG tablet (Start on 11/26/2019)   oxyCODONE-acetaminophen (PERCOCET) 7.5-325 MG tablet (Start on 10/26/2019)    Other Visit Diagnoses    Encounter for chronic pain management       Relevant Medications   oxyCODONE-acetaminophen (PERCOCET) 7.5-325 MG tablet   oxyCODONE-acetaminophen (PERCOCET) 7.5-325 MG tablet  (Start on 11/26/2019)   oxyCODONE-acetaminophen (PERCOCET) 7.5-325 MG tablet (Start on 10/26/2019)   Osteoarthritis of cervical spine with myelopathy       Relevant Medications   oxyCODONE-acetaminophen (PERCOCET) 7.5-325 MG tablet   oxyCODONE-acetaminophen (PERCOCET) 7.5-325 MG tablet (Start on 11/26/2019)   oxyCODONE-acetaminophen (PERCOCET) 7.5-325 MG tablet (Start on 10/26/2019)   Depression, major, single episode, mild (HCC)       Relevant Medications   sertraline (ZOLOFT) 50 MG tablet   Need for hepatitis C screening test       Relevant Orders   Hepatitis C antibody      No change in medication and do pill holidays  Follow up plan: Return in about 3 months (around 12/27/2019), or if symptoms worsen or fail to improve, for Pain and thyroid recheck.  Counseling provided for all of the vaccine components Orders Placed This Encounter  Procedures  . CBC with Differential/Platelet  . CMP14+EGFR  . Lipid panel  . TSH  . Hepatitis C antibody    Caryl Pina, MD [facility] 09/27/2019, 10:49 AM

## 2019-09-28 LAB — CMP14+EGFR
ALT: 12 IU/L (ref 0–44)
AST: 15 IU/L (ref 0–40)
Albumin/Globulin Ratio: 1.9 (ref 1.2–2.2)
Albumin: 4.3 g/dL (ref 3.7–4.7)
Alkaline Phosphatase: 59 IU/L (ref 44–121)
BUN/Creatinine Ratio: 18 (ref 10–24)
BUN: 17 mg/dL (ref 8–27)
Bilirubin Total: 0.3 mg/dL (ref 0.0–1.2)
CO2: 22 mmol/L (ref 20–29)
Calcium: 9.9 mg/dL (ref 8.6–10.2)
Chloride: 102 mmol/L (ref 96–106)
Creatinine, Ser: 0.97 mg/dL (ref 0.76–1.27)
GFR calc Af Amer: 89 mL/min/{1.73_m2} (ref >59)
GFR calc non Af Amer: 77 mL/min/{1.73_m2} (ref >59)
Globulin, Total: 2.3 g/dL (ref 1.5–4.5)
Glucose: 138 mg/dL — ABNORMAL HIGH (ref 65–99)
Potassium: 4 mmol/L (ref 3.5–5.2)
Sodium: 138 mmol/L (ref 134–144)
Total Protein: 6.6 g/dL (ref 6.0–8.5)

## 2019-09-28 LAB — CBC WITH DIFFERENTIAL/PLATELET
Basophils Absolute: 0 10*3/uL (ref 0.0–0.2)
Basos: 0 %
EOS (ABSOLUTE): 0.1 10*3/uL (ref 0.0–0.4)
Eos: 2 %
Hematocrit: 45.8 % (ref 37.5–51.0)
Hemoglobin: 15.2 g/dL (ref 13.0–17.7)
Immature Grans (Abs): 0 10*3/uL (ref 0.0–0.1)
Immature Granulocytes: 0 %
Lymphocytes Absolute: 2.1 10*3/uL (ref 0.7–3.1)
Lymphs: 25 %
MCH: 32 pg (ref 26.6–33.0)
MCHC: 33.2 g/dL (ref 31.5–35.7)
MCV: 96 fL (ref 79–97)
Monocytes Absolute: 0.7 10*3/uL (ref 0.1–0.9)
Monocytes: 8 %
Neutrophils Absolute: 5.5 10*3/uL (ref 1.4–7.0)
Neutrophils: 65 %
Platelets: 251 10*3/uL (ref 150–450)
RBC: 4.75 x10E6/uL (ref 4.14–5.80)
RDW: 12.8 % (ref 11.6–15.4)
WBC: 8.6 10*3/uL (ref 3.4–10.8)

## 2019-09-28 LAB — LIPID PANEL
Chol/HDL Ratio: 3.1 ratio (ref 0.0–5.0)
Cholesterol, Total: 164 mg/dL (ref 100–199)
HDL: 53 mg/dL (ref >39)
LDL Chol Calc (NIH): 73 mg/dL (ref 0–99)
Triglycerides: 235 mg/dL — ABNORMAL HIGH (ref 0–149)
VLDL Cholesterol Cal: 38 mg/dL (ref 5–40)

## 2019-09-28 LAB — HEPATITIS C ANTIBODY: Hep C Virus Ab: 0.2 s/co ratio (ref 0.0–0.9)

## 2019-09-28 LAB — TSH: TSH: 3.43 u[IU]/mL (ref 0.450–4.500)

## 2019-11-12 NOTE — Progress Notes (Signed)
Patient location: home Provider location: office

## 2019-12-19 ENCOUNTER — Other Ambulatory Visit: Payer: Self-pay

## 2019-12-19 ENCOUNTER — Ambulatory Visit (INDEPENDENT_AMBULATORY_CARE_PROVIDER_SITE_OTHER): Payer: Medicare Other | Admitting: Family Medicine

## 2019-12-19 ENCOUNTER — Encounter: Payer: Self-pay | Admitting: Family Medicine

## 2019-12-19 VITALS — BP 130/74 | HR 66 | Temp 98.0°F | Ht 72.0 in | Wt 234.0 lb

## 2019-12-19 DIAGNOSIS — M4712 Other spondylosis with myelopathy, cervical region: Secondary | ICD-10-CM

## 2019-12-19 DIAGNOSIS — E079 Disorder of thyroid, unspecified: Secondary | ICD-10-CM | POA: Diagnosis not present

## 2019-12-19 DIAGNOSIS — Z0289 Encounter for other administrative examinations: Secondary | ICD-10-CM

## 2019-12-19 DIAGNOSIS — G8929 Other chronic pain: Secondary | ICD-10-CM

## 2019-12-19 DIAGNOSIS — I10 Essential (primary) hypertension: Secondary | ICD-10-CM

## 2019-12-19 MED ORDER — OXYCODONE-ACETAMINOPHEN 7.5-325 MG PO TABS: 1.0000 | ORAL_TABLET | Freq: Four times a day (QID) | ORAL | 0 refills | Status: DC | PRN

## 2019-12-19 NOTE — Progress Notes (Signed)
BP 130/74   Pulse 66   Temp 98 F (36.7 C)   Ht 6' (1.829 m)   Wt 234 lb (106.1 kg)   SpO2 97%   BMI 31.74 kg/m    Subjective:   Patient ID: [name], male    DOB: May 11, 1945, 74 y.o.   MRN: [id number]  HPI: [name] is a 74 y.o. male presenting on 12/19/2019 for Medical Management of Chronic Issues and Hyperlipidemia   HPI Pain assessment: Cause of pain-cervical spine degeneration Pain location-neck Pain on scale of 1-10-5 Frequency-Daily What increases pain-movement and weather changes. What makes pain Better-oxycodone Effects on ADL -limits some because of pain especially on prolonged standing or working. Any change in general medical condition-none  Current opioids rx-oxycodone-acetaminophen 7.5-325 every 6 hours as needed # meds rx-120 Effectiveness of current meds-works well Adverse reactions from pain meds-none Morphine equivalent-45  Pill count performed-No Last drug screen -04/03/2019 ( high risk q20m, moderate risk q18m, low risk yearly ) Urine drug screen today- No Was the [place]-yes  If yes were their any concerning findings? -None    Opioid Risk  03/27/2019  Alcohol 0  Illegal Drugs 0  Rx Drugs 0  Alcohol 0  Illegal Drugs 0  Rx Drugs 0  Age between 16-45 years  0  History of Preadolescent Sexual Abuse 0  Psychological Disease 0  Depression 1  Opioid Risk Tool Scoring 1  Opioid Risk Interpretation Low Risk     Pain contract signed on: 04/03/2019  Hypertension Patient is currently on lisinopril hydrochlorothiazide and amlodipine, and their blood pressure today is 130/74. Patient denies any lightheadedness or dizziness. Patient denies headaches, blurred vision, chest pains, shortness of breath, or weakness. Denies any side effects from medication and is content with current medication.   Hypothyroidism recheck Patient is coming in for thyroid recheck today as well. They deny any issues with hair changes or heat or cold  problems or diarrhea or constipation. They deny any chest pain or palpitations. They are currently on levothyroxine 149micrograms   Relevant past medical, surgical, family and social history reviewed and updated as indicated. Interim medical history since our last visit reviewed. Allergies and medications reviewed and updated.  Review of Systems  Constitutional: Negative for chills and fever.  Eyes: Negative for visual disturbance.  Respiratory: Negative for shortness of breath and wheezing.   Cardiovascular: Negative for chest pain and leg swelling.  Musculoskeletal: Positive for arthralgias and neck pain. Negative for back pain and gait problem.  Skin: Negative for rash.  Neurological: Negative for dizziness, weakness and light-headedness.  All other systems reviewed and are negative.   Per HPI unless specifically indicated above   Allergies as of 12/19/2019   No Known Allergies     Medication List       Accurate as of December 19, 2019  9:20 AM. If you have any questions, ask your nurse or doctor.        albuterol 108 (90 Base) MCG/ACT inhaler Commonly known as: ProAir HFA Inhale 2 puffs into the lungs every 6 (six) hours as needed for wheezing or shortness of breath.   amLODipine 10 MG tablet Commonly known as: NORVASC Take 1 tablet (10 mg total) by mouth daily.   aspirin EC 81 MG tablet Take 1 tablet (81 mg total) by mouth daily.   levothyroxine 150 MCG tablet Commonly known as: SYNTHROID Take 1 tablet (150 mcg total) by mouth daily.   lisinopril-hydrochlorothiazide 20-25 MG tablet Commonly  known as: ZESTORETIC Take 1 tablet by mouth daily.   oxyCODONE-acetaminophen 7.5-325 MG tablet Commonly known as: Percocet Take 1 tablet by mouth every 6 (six) hours as needed for severe pain. What changed: Another medication with the same name was changed. Make sure you understand how and when to take each. Changed by: Worthy Rancher, MD   oxyCODONE-acetaminophen  7.5-325 MG tablet Commonly known as: Percocet Take 1 tablet by mouth every 6 (six) hours as needed for severe pain. Start taking on: January 18, 2020 What changed: These instructions start on January 18, 2020. If you are unsure what to do until then, ask your doctor or other care provider. Changed by: Worthy Rancher, MD   oxyCODONE-acetaminophen 7.5-325 MG tablet Commonly known as: Percocet Take 1 tablet by mouth every 6 (six) hours as needed for severe pain. Start taking on: February 17, 2020 What changed: These instructions start on February 17, 2020. If you are unsure what to do until then, ask your doctor or other care provider. Changed by: Fransisca Kaufmann Aidon Klemens, MD   sertraline 50 MG tablet Commonly known as: Zoloft Take 1 tablet (50 mg total) by mouth daily.   sildenafil 20 MG tablet Commonly known as: REVATIO Take 1-3 tablets (20-60 mg total) by mouth as needed.   simvastatin 40 MG tablet Commonly known as: ZOCOR Take 1 tablet (40 mg total) by mouth at bedtime.        Objective:   BP 130/74   Pulse 66   Temp 98 F (36.7 C)   Ht 6' (1.829 m)   Wt 234 lb (106.1 kg)   SpO2 97%   BMI 31.74 kg/m   Wt Readings from Last 3 Encounters:  12/19/19 234 lb (106.1 kg)  09/27/19 227 lb 9.6 oz (103.2 kg)  08/19/19 232 lb 6.4 oz (105.4 kg)    Physical Exam Vitals and nursing note reviewed.  Constitutional:      General: He is not in acute distress.    Appearance: He is well-developed and well-nourished. He is not diaphoretic.  Eyes:     General: No scleral icterus.    Extraocular Movements: EOM normal.     Conjunctiva/sclera: Conjunctivae normal.  Neck:     Thyroid: No thyromegaly.  Cardiovascular:     Rate and Rhythm: Normal rate and regular rhythm.     Pulses: Intact distal pulses.     Heart sounds: Normal heart sounds. No murmur heard.   Pulmonary:     Effort: Pulmonary effort is normal. No respiratory distress.     Breath sounds: Normal breath sounds. No  wheezing.  Musculoskeletal:        General: No edema.  Skin:    General: Skin is warm and dry.     Findings: No rash.  Neurological:     Mental Status: He is alert and oriented to person, place, and time.     Coordination: Coordination normal.  Psychiatric:        Mood and Affect: Mood and affect normal.        Behavior: Behavior normal.       Assessment & Plan:   Problem List Items Addressed This Visit      Cardiovascular and Mediastinum   Essential hypertension     Endocrine   Thyroid disease - Primary     Other   Pain management contract signed   Relevant Medications   oxyCODONE-acetaminophen (PERCOCET) 7.5-325 MG tablet   oxyCODONE-acetaminophen (PERCOCET) 7.5-325 MG tablet (Start on 02/17/2020)  oxyCODONE-acetaminophen (PERCOCET) 7.5-325 MG tablet (Start on 01/18/2020)   Other Relevant Orders   TSH    Other Visit Diagnoses    Encounter for chronic pain management       Relevant Medications   oxyCODONE-acetaminophen (PERCOCET) 7.5-325 MG tablet   oxyCODONE-acetaminophen (PERCOCET) 7.5-325 MG tablet (Start on 02/17/2020)   oxyCODONE-acetaminophen (PERCOCET) 7.5-325 MG tablet (Start on 01/18/2020)   Other Relevant Orders   TSH   Osteoarthritis of cervical spine with myelopathy       Relevant Medications   oxyCODONE-acetaminophen (PERCOCET) 7.5-325 MG tablet   oxyCODONE-acetaminophen (PERCOCET) 7.5-325 MG tablet (Start on 02/17/2020)   oxyCODONE-acetaminophen (PERCOCET) 7.5-325 MG tablet (Start on 01/18/2020)   Other Relevant Orders   TSH      Continue current medication, patient seems to be doing well, refill on pain medicine. Follow up plan: Return in about 3 months (around 03/18/2020), or if symptoms worsen or fail to improve, for Pain management hypertension and thyroid..  Counseling provided for all of the vaccine components Orders Placed This Encounter  Procedures  . TSH    Caryl Pina, MD [facility] 12/19/2019, 9:20  AM

## 2020-01-04 HISTORY — PX: HEMORRHOID SURGERY: SHX153

## 2020-02-24 ENCOUNTER — Other Ambulatory Visit: Payer: Self-pay | Admitting: Family Medicine

## 2020-02-24 DIAGNOSIS — M4712 Other spondylosis with myelopathy, cervical region: Secondary | ICD-10-CM

## 2020-02-24 DIAGNOSIS — G8929 Other chronic pain: Secondary | ICD-10-CM

## 2020-02-24 DIAGNOSIS — Z0289 Encounter for other administrative examinations: Secondary | ICD-10-CM

## 2020-02-24 NOTE — Telephone Encounter (Signed)
Patient should still have 1 on file that he either just filled or can fill, his third prescription from last time was available to fill on 02/17/2020 so he should have it available.  He is not due to be seen until next month

## 2020-03-19 ENCOUNTER — Encounter: Payer: Self-pay | Admitting: Family Medicine

## 2020-03-19 ENCOUNTER — Other Ambulatory Visit: Payer: Self-pay

## 2020-03-19 ENCOUNTER — Encounter (INDEPENDENT_AMBULATORY_CARE_PROVIDER_SITE_OTHER): Payer: Self-pay | Admitting: *Deleted

## 2020-03-19 ENCOUNTER — Ambulatory Visit (INDEPENDENT_AMBULATORY_CARE_PROVIDER_SITE_OTHER): Payer: Medicare Other | Admitting: Family Medicine

## 2020-03-19 VITALS — BP 141/80 | HR 66 | Ht 72.0 in | Wt 229.0 lb

## 2020-03-19 DIAGNOSIS — M4712 Other spondylosis with myelopathy, cervical region: Secondary | ICD-10-CM | POA: Diagnosis not present

## 2020-03-19 DIAGNOSIS — Z1211 Encounter for screening for malignant neoplasm of colon: Secondary | ICD-10-CM | POA: Diagnosis not present

## 2020-03-19 DIAGNOSIS — I10 Essential (primary) hypertension: Secondary | ICD-10-CM

## 2020-03-19 DIAGNOSIS — G8929 Other chronic pain: Secondary | ICD-10-CM

## 2020-03-19 DIAGNOSIS — E079 Disorder of thyroid, unspecified: Secondary | ICD-10-CM

## 2020-03-19 DIAGNOSIS — Z0289 Encounter for other administrative examinations: Secondary | ICD-10-CM | POA: Diagnosis not present

## 2020-03-19 DIAGNOSIS — E782 Mixed hyperlipidemia: Secondary | ICD-10-CM | POA: Diagnosis not present

## 2020-03-19 DIAGNOSIS — M542 Cervicalgia: Secondary | ICD-10-CM | POA: Diagnosis not present

## 2020-03-19 MED ORDER — OXYCODONE-ACETAMINOPHEN 7.5-325 MG PO TABS: 1.0000 | ORAL_TABLET | Freq: Four times a day (QID) | ORAL | 0 refills | Status: DC | PRN

## 2020-03-19 NOTE — Addendum Note (Signed)
Addended by: Alphonzo Dublin on: 03/19/2020 09:28 AM   Modules accepted: Orders

## 2020-03-19 NOTE — Progress Notes (Signed)
BP (!) 141/80   Pulse 66   Ht 6' (1.829 m)   Wt 229 lb (103.9 kg)   SpO2 95%   BMI 31.06 kg/m    Subjective:   Patient ID: [name], male    DOB: 1945-07-06, 75 y.o.   MRN: [id number]  HPI: [name] is a 75 y.o. male presenting on 03/19/2020 for Medical Management of Chronic Issues, Hyperlipidemia, Hypertension, and Hypothyroidism   HPI Pain assessment: Cause of pain-cervical osteoarthritis Pain location-neck Pain on scale of 1-10- 5 Frequency-Daily What increases pain-weather and movement What makes pain Better-Percocet Effects on ADL - minimal Any change in general medical condition-none  Current opioids rx-Percocet 7.5-325 every 6 hours as needed # meds rx-120 Effectiveness of current meds-works well Adverse reactions from pain meds-none Morphine equivalent-45  Pill count performed-No Last drug screen -March 2021 ( high risk q90m moderate risk q634mlow risk yearly ) Urine drug screen today- Yes [place]  If yes were their any concerning findings? -None Opioid Risk  03/27/2019  Alcohol 0  Illegal Drugs 0  Rx Drugs 0  Alcohol 0  Illegal Drugs 0  Rx Drugs 0  Age between 16-45 years  0  History of Preadolescent Sexual Abuse 0  Psychological Disease 0  Depression 1  Opioid Risk Tool Scoring 1  Opioid Risk Interpretation Low Risk     Pain contract signed on: Today  Hypothyroidism recheck Patient is coming in for thyroid recheck today as well. They deny any issues with hair changes or heat or cold problems or diarrhea or constipation. They deny any chest pain or palpitations. They are currently on levothyroxine 150 micrograms   Hypertension Patient is currently on lisinopril hydrochlorothiazide and amlodipine, and their blood pressure today is 141/80. Patient denies any lightheadedness or dizziness. Patient denies headaches, blurred vision, chest pains, shortness of breath, or weakness. Denies any side effects from medication  and is content with current medication.   Hyperlipidemia Patient is coming in for recheck of his hyperlipidemia. The patient is currently taking simvastatin. They deny any issues with myalgias or history of liver damage from it. They deny any focal numbness or weakness or chest pain.   Relevant past medical, surgical, family and social history reviewed and updated as indicated. Interim medical history since our last visit reviewed. Allergies and medications reviewed and updated.  Review of Systems  Constitutional: Negative for chills and fever.  Respiratory: Negative for shortness of breath and wheezing.   Cardiovascular: Negative for chest pain and leg swelling.  Musculoskeletal: Negative for back pain and gait problem.  Skin: Negative for rash.  All other systems reviewed and are negative.   Per HPI unless specifically indicated above   Allergies as of 03/19/2020   No Known Allergies     Medication List       Accurate as of March 19, 2020  9:19 AM. If you have any questions, ask your nurse or doctor.        albuterol 108 (90 Base) MCG/ACT inhaler Commonly known as: ProAir HFA Inhale 2 puffs into the lungs every 6 (six) hours as needed for wheezing or shortness of breath.   amLODipine 10 MG tablet Commonly known as: NORVASC Take 1 tablet (10 mg total) by mouth daily.   aspirin EC 81 MG tablet Take 1 tablet (81 mg total) by mouth daily.   levothyroxine 150 MCG tablet Commonly known as: SYNTHROID Take 1 tablet (150 mcg total) by mouth daily.  lisinopril-hydrochlorothiazide 20-25 MG tablet Commonly known as: ZESTORETIC Take 1 tablet by mouth daily.   oxyCODONE-acetaminophen 7.5-325 MG tablet Commonly known as: Percocet Take 1 tablet by mouth every 6 (six) hours as needed for severe pain. What changed: Another medication with the same name was changed. Make sure you understand how and when to take each. Changed by: Worthy Rancher, MD   oxyCODONE-acetaminophen  7.5-325 MG tablet Commonly known as: Percocet Take 1 tablet by mouth every 6 (six) hours as needed for severe pain. Start taking on: April 18, 2020 What changed: These instructions start on April 18, 2020. If you are unsure what to do until then, ask your doctor or other care provider. Changed by: Worthy Rancher, MD   oxyCODONE-acetaminophen 7.5-325 MG tablet Commonly known as: Percocet Take 1 tablet by mouth every 6 (six) hours as needed for severe pain. Start taking on: May 18, 2020 What changed: These instructions start on May 18, 2020. If you are unsure what to do until then, ask your doctor or other care provider. Changed by: Fransisca Kaufmann Dettinger, MD   sertraline 50 MG tablet Commonly known as: Zoloft Take 1 tablet (50 mg total) by mouth daily.   sildenafil 20 MG tablet Commonly known as: REVATIO Take 1-3 tablets (20-60 mg total) by mouth as needed.   simvastatin 40 MG tablet Commonly known as: ZOCOR Take 1 tablet (40 mg total) by mouth at bedtime.        Objective:   BP (!) 141/80   Pulse 66   Ht 6' (1.829 m)   Wt 229 lb (103.9 kg)   SpO2 95%   BMI 31.06 kg/m   Wt Readings from Last 3 Encounters:  03/19/20 229 lb (103.9 kg)  12/19/19 234 lb (106.1 kg)  09/27/19 227 lb 9.6 oz (103.2 kg)    Physical Exam Vitals and nursing note reviewed.  Constitutional:      General: He is not in acute distress.    Appearance: He is well-developed. He is not diaphoretic.  Eyes:     General: No scleral icterus.    Conjunctiva/sclera: Conjunctivae normal.  Neck:     Thyroid: No thyromegaly.  Cardiovascular:     Rate and Rhythm: Normal rate and regular rhythm.     Heart sounds: Normal heart sounds. No murmur heard.   Pulmonary:     Effort: Pulmonary effort is normal. No respiratory distress.     Breath sounds: Normal breath sounds. No wheezing.  Musculoskeletal:        General: Normal range of motion.     Cervical back: Neck supple.  Lymphadenopathy:     Cervical:  No cervical adenopathy.  Skin:    General: Skin is warm and dry.     Findings: No rash.  Neurological:     Mental Status: He is alert and oriented to person, place, and time.     Coordination: Coordination normal.  Psychiatric:        Behavior: Behavior normal.       Assessment & Plan:   Problem List Items Addressed This Visit      Cardiovascular and Mediastinum   Essential hypertension   Relevant Orders   CBC with Differential/Platelet   CMP14+EGFR   Lipid panel   TSH   ToxASSURE Select 13 (MW), Urine     Endocrine   Thyroid disease - Primary   Relevant Orders   TSH     Other   Hyperlipidemia   Relevant Orders  CBC with Differential/Platelet   CMP14+EGFR   Lipid panel   TSH   ToxASSURE Select 13 (MW), Urine   Pain management contract signed   Relevant Medications   oxyCODONE-acetaminophen (PERCOCET) 7.5-325 MG tablet   oxyCODONE-acetaminophen (PERCOCET) 7.5-325 MG tablet (Start on 05/18/2020)   oxyCODONE-acetaminophen (PERCOCET) 7.5-325 MG tablet (Start on 04/18/2020)    Other Visit Diagnoses    Chronic neck pain       Relevant Medications   oxyCODONE-acetaminophen (PERCOCET) 7.5-325 MG tablet   oxyCODONE-acetaminophen (PERCOCET) 7.5-325 MG tablet (Start on 05/18/2020)   oxyCODONE-acetaminophen (PERCOCET) 7.5-325 MG tablet (Start on 04/18/2020)   Other Relevant Orders   ToxASSURE Select 13 (MW), Urine   Encounter for chronic pain management       Relevant Medications   oxyCODONE-acetaminophen (PERCOCET) 7.5-325 MG tablet   oxyCODONE-acetaminophen (PERCOCET) 7.5-325 MG tablet (Start on 05/18/2020)   oxyCODONE-acetaminophen (PERCOCET) 7.5-325 MG tablet (Start on 04/18/2020)   Osteoarthritis of cervical spine with myelopathy       Relevant Medications   oxyCODONE-acetaminophen (PERCOCET) 7.5-325 MG tablet   oxyCODONE-acetaminophen (PERCOCET) 7.5-325 MG tablet (Start on 05/18/2020)   oxyCODONE-acetaminophen (PERCOCET) 7.5-325 MG tablet (Start on 04/18/2020)       Patient seems to be doing well, continue current medication, will check blood work for thyroid and cholesterol and everything. Follow up plan: Return in about 3 months (around 06/19/2020), or if symptoms worsen or fail to improve, for Pain management and hypertension and thyroid recheck.  Counseling provided for all of the vaccine components Orders Placed This Encounter  Procedures  . CBC with Differential/Platelet  . CMP14+EGFR  . Lipid panel  . TSH  . ToxASSURE Select 13 (MW), Urine    Caryl Pina, MD [facility] 03/19/2020, 9:19 AM

## 2020-03-20 LAB — LIPID PANEL
Chol/HDL Ratio: 3 ratio (ref 0.0–5.0)
Cholesterol, Total: 186 mg/dL (ref 100–199)
HDL: 63 mg/dL (ref >39)
LDL Chol Calc (NIH): 94 mg/dL (ref 0–99)
Triglycerides: 172 mg/dL — ABNORMAL HIGH (ref 0–149)
VLDL Cholesterol Cal: 29 mg/dL (ref 5–40)

## 2020-03-20 LAB — CBC WITH DIFFERENTIAL/PLATELET
Basophils Absolute: 0 10*3/uL (ref 0.0–0.2)
Basos: 1 %
EOS (ABSOLUTE): 0.3 10*3/uL (ref 0.0–0.4)
Eos: 3 %
Hematocrit: 42.8 % (ref 37.5–51.0)
Hemoglobin: 14.3 g/dL (ref 13.0–17.7)
Immature Grans (Abs): 0 10*3/uL (ref 0.0–0.1)
Immature Granulocytes: 0 %
Lymphocytes Absolute: 2 10*3/uL (ref 0.7–3.1)
Lymphs: 24 %
MCH: 33.3 pg — ABNORMAL HIGH (ref 26.6–33.0)
MCHC: 33.4 g/dL (ref 31.5–35.7)
MCV: 100 fL — ABNORMAL HIGH (ref 79–97)
Monocytes Absolute: 0.6 10*3/uL (ref 0.1–0.9)
Monocytes: 8 %
Neutrophils Absolute: 5.1 10*3/uL (ref 1.4–7.0)
Neutrophils: 64 %
Platelets: 264 10*3/uL (ref 150–450)
RBC: 4.3 x10E6/uL (ref 4.14–5.80)
RDW: 13.3 % (ref 11.6–15.4)
WBC: 8 10*3/uL (ref 3.4–10.8)

## 2020-03-20 LAB — CMP14+EGFR
ALT: 16 IU/L (ref 0–44)
AST: 28 IU/L (ref 0–40)
Albumin/Globulin Ratio: 1.9 (ref 1.2–2.2)
Albumin: 4.6 g/dL (ref 3.7–4.7)
Alkaline Phosphatase: 53 IU/L (ref 44–121)
BUN/Creatinine Ratio: 15 (ref 10–24)
BUN: 17 mg/dL (ref 8–27)
Bilirubin Total: 0.5 mg/dL (ref 0.0–1.2)
CO2: 23 mmol/L (ref 20–29)
Calcium: 9.9 mg/dL (ref 8.6–10.2)
Chloride: 100 mmol/L (ref 96–106)
Creatinine, Ser: 1.12 mg/dL (ref 0.76–1.27)
Globulin, Total: 2.4 g/dL (ref 1.5–4.5)
Glucose: 95 mg/dL (ref 65–99)
Potassium: 4.5 mmol/L (ref 3.5–5.2)
Sodium: 139 mmol/L (ref 134–144)
Total Protein: 7 g/dL (ref 6.0–8.5)
eGFR: 69 mL/min/{1.73_m2} (ref >59)

## 2020-03-20 LAB — TSH: TSH: 34.8 u[IU]/mL — ABNORMAL HIGH (ref 0.450–4.500)

## 2020-03-29 LAB — TOXASSURE SELECT 13 (MW), URINE

## 2020-04-09 ENCOUNTER — Other Ambulatory Visit (INDEPENDENT_AMBULATORY_CARE_PROVIDER_SITE_OTHER): Payer: Self-pay

## 2020-04-09 ENCOUNTER — Other Ambulatory Visit: Payer: Self-pay

## 2020-04-09 ENCOUNTER — Telehealth (INDEPENDENT_AMBULATORY_CARE_PROVIDER_SITE_OTHER): Payer: Self-pay

## 2020-04-09 ENCOUNTER — Encounter (INDEPENDENT_AMBULATORY_CARE_PROVIDER_SITE_OTHER): Payer: Self-pay

## 2020-04-09 ENCOUNTER — Encounter (INDEPENDENT_AMBULATORY_CARE_PROVIDER_SITE_OTHER): Payer: Self-pay | Admitting: Gastroenterology

## 2020-04-09 ENCOUNTER — Ambulatory Visit (INDEPENDENT_AMBULATORY_CARE_PROVIDER_SITE_OTHER): Payer: Medicare Other | Admitting: Gastroenterology

## 2020-04-09 VITALS — BP 133/81 | HR 62 | Temp 98.4°F | Ht 72.0 in | Wt 231.0 lb

## 2020-04-09 DIAGNOSIS — D126 Benign neoplasm of colon, unspecified: Secondary | ICD-10-CM | POA: Diagnosis not present

## 2020-04-09 DIAGNOSIS — K625 Hemorrhage of anus and rectum: Secondary | ICD-10-CM

## 2020-04-09 MED ORDER — PEG 3350-KCL-NA BICARB-NACL 420 G PO SOLR: 4000.0000 mL | ORAL | 0 refills | Status: DC

## 2020-04-09 NOTE — Progress Notes (Signed)
[name], M.D. Gastroenterology & Hepatology [facility] [address] [place], [place] [address] Primary Care Physician: Dettinger, Fransisca Kaufmann, MD [place] [id number]  Referring MD: PCP  Chief Complaint: Rectal bleeding  History of Present Illness: [name] is a 75 y.o. male with past medical history of coronary artery disease status post stent placement, hypothyroidism, OSA, hyperlipidemia, who presents for evaluation of rectal bleeding.  Patient reports for the last 6 weeks he has presented recurrent episodes of rectal bleeding, described as bright blood on top of the stool. He has a bowel movement every 1-2 days. Stool is hard sometimes and has to strain to have a BM. He is taking stool softeners intermittently But not on a daily basis. Has had very intermittent episodes of lower abdominal pain, which are very infrequent. The patient denies having any nausea, vomiting, fever, chills, melena, hematemesis, abdominal distention, abdominal pain, diarrhea, dyschezia, jaundice, pruritus or weight loss.  Denies intake of NSAIDs, anticoagulants, high dose aspirin, or any other antiplatelet. Is only on Aspirin 81 mg qday.  Last EGD: 2015 -  Esophagus:  Mucosa of the proximal and middle third was normal. Incomplete ring noted 2 cm proximal to GE junction. Soft stricture noted at GE junction. Scope passed across it without any resistance. Esophageal stricture dilated by passing 56 French Maloney dilator disrupting mucosa at GEJ. GEJ:  38 cm Hiatus:  41 cm Stomach:  Stomach was empty and distended very well with insufflation. Folds in the proximal stomach were normal. Examination mucosa and body, antrum, pyloric channel, angularis, fundus and cardia was normal. Duodenum:  Normal bulbar and post bulbar mucosa.  Last Colonoscopy: 2015  Prep excellent. Three small polyps ablated via cold biopsy from cecum and one from  the ascending colon and these polyps were submitted together. (all TA) Few scattered diverticula at sigmoid colon. Normal rectal mucosa. Mall hemorrhoids with focal erythema noted below the dentate line.  FHx: neg for any gastrointestinal/liver disease, no malignancies Social: neg smoking, alcohol or illicit drug use Surgical: no abdominal surgeries  Past Medical History: Past Medical History:  Diagnosis Date  . Anginal pain ([place])   . Arthritis    OSTEO BACK KNEES HIPS & HANDS  . Arthropathy, unspecified, site unspecified   . Chronic airway obstruction, not elsewhere classified   . Coronary atherosclerosis of unspecified type of vessel, native or graft    a. LHC 5/16:  mLAD 20%, CFX stent ok, OM3 stent ok, mRCA 30%, normal LVF  . Headache(784.0)   . Hx of echocardiogram    a. Echo 5/16:  mild LVH, EF 60%, mildly dilated Ao root (40 mm)  . Hypothyroidism   . Other and unspecified hyperlipidemia   . Thyroid disease    hypothyroid  . Unspecified essential hypertension   . Unspecified sleep apnea    Dr. Jacelyn Grip at [facility] told pt he had sleep apnea, but never sent him for a sleep study so he doesn't wear a CPAP    Past Surgical History: Past Surgical History:  Procedure Laterality Date  . ANKLE SURGERY    . BALLOON DILATION N/A 05/02/2013   Procedure: BALLOON DILATION;  Surgeon: Rogene Houston, MD;  [facility];  Service: Endoscopy;  Laterality: N/A;  . CARDIAC CATHETERIZATION  01/15/2013  . CARDIAC CATHETERIZATION N/A 05/16/2014   Procedure: Left Heart Cath and Coronary Angiography;  Surgeon: Peter M Martinique, MD;  Location: [facility];  Service: Cardiovascular;  Laterality: N/A;  . COLONOSCOPY N/A 05/02/2013   Procedure: COLONOSCOPY;  Surgeon: Rogene Houston, MD;  Location: [facility];  Service: Endoscopy;  Laterality: N/A;  200-moved to 1200 Ann notified pt  . CORONARY STENT PLACEMENT     2003, 2004  . ESOPHAGOGASTRODUODENOSCOPY N/A 05/02/2013    Procedure: ESOPHAGOGASTRODUODENOSCOPY (EGD);  Surgeon: Rogene Houston, MD;  [facility];  Service: Endoscopy;  Laterality: N/A;  . HEMORRHOID SURGERY    . HIP ARTHROPLASTY    . KNEE SURGERY Left 12/2013  . LEFT HEART CATHETERIZATION WITH CORONARY ANGIOGRAM N/A 01/15/2013   Procedure: LEFT HEART CATHETERIZATION WITH CORONARY ANGIOGRAM;  Surgeon: Sinclair Grooms, MD;  Location: [facility] CATH LAB;  Service: Cardiovascular;  Laterality: N/A;  . MALONEY DILATION N/A 05/02/2013   Procedure: Venia Minks DILATION;  Surgeon: Rogene Houston, MD;  [facility];  Service: Endoscopy;  Laterality: N/A;  . SAVORY DILATION N/A 05/02/2013   Procedure: SAVORY DILATION;  Surgeon: Rogene Houston, MD;  [facility];  Service: Endoscopy;  Laterality: N/A;  . TONSILLECTOMY      Family History: Family History  Problem Relation Age of Onset  . Stroke Mother   . CAD Father        MI at age 83  . Colon cancer Neg Hx     Social History: Social History   Tobacco Use  Smoking Status Former Smoker  . Packs/day: 3.00  . Years: 20.00  . Pack years: 60.00  . Types: Cigarettes  . Quit date: 05/26/1975  . Years since quitting: 44.9  Smokeless Tobacco Current User  . Types: Chew   Social History   Substance and Sexual Activity  Alcohol Use No   Social History   Substance and Sexual Activity  Drug Use No    Allergies: No Known Allergies  Medications: Current Outpatient Medications  Medication Sig Dispense Refill  . albuterol (PROAIR HFA) 108 (90 BASE) MCG/ACT inhaler Inhale 2 puffs into the lungs every 6 (six) hours as needed for wheezing or shortness of breath. 1 Inhaler 2  . amLODipine (NORVASC) 10 MG tablet Take 1 tablet (10 mg total) by mouth daily. 90 tablet 3  . aspirin EC 81 MG tablet Take 1 tablet (81 mg total) by mouth daily. 90 tablet 3  . levothyroxine (SYNTHROID) 150 MCG tablet Take 1 tablet (150 mcg total) by mouth daily. 90 tablet 3  .  lisinopril-hydrochlorothiazide (ZESTORETIC) 20-25 MG tablet Take 1 tablet by mouth daily. 90 tablet 3  . oxyCODONE-acetaminophen (PERCOCET) 7.5-325 MG tablet Take 1 tablet by mouth every 6 (six) hours as needed for severe pain. 120 tablet 0  . sertraline (ZOLOFT) 50 MG tablet Take 1 tablet (50 mg total) by mouth daily. 90 tablet 3  . sildenafil (REVATIO) 20 MG tablet Take 1-3 tablets (20-60 mg total) by mouth as needed. 30 tablet 3  . simvastatin (ZOCOR) 40 MG tablet Take 1 tablet (40 mg total) by mouth at bedtime. 90 tablet 3   No current facility-administered medications for this visit.    Review of Systems: GENERAL: negative for malaise, night sweats HEENT: No changes in hearing or vision, no nose bleeds or other nasal problems. NECK: Negative for lumps, goiter, pain and significant neck swelling RESPIRATORY: Negative for cough, wheezing CARDIOVASCULAR: Negative for chest pain, leg swelling, palpitations, orthopnea GI: SEE HPI MUSCULOSKELETAL: Negative for joint pain or swelling, back pain, and muscle pain. SKIN: Negative for lesions, rash PSYCH: Negative  for sleep disturbance, mood disorder and recent psychosocial stressors. HEMATOLOGY Negative for prolonged bleeding, bruising easily, and swollen nodes. ENDOCRINE: Negative for cold or heat intolerance, polyuria, polydipsia and goiter. NEURO: negative for tremor, gait imbalance, syncope and seizures. The remainder of the review of systems is noncontributory.   Physical Exam: BP 133/81 (BP Location: Left Arm, Patient Position: Sitting, Cuff Size: Large)   Pulse 62   Temp 98.4 F (36.9 C) (Oral)   Ht 6' (1.829 m)   Wt 231 lb (104.8 kg)   BMI 31.33 kg/m  GENERAL: The patient is AO x3, in no acute distress.  Obese. HEENT: Head is normocephalic and atraumatic. EOMI are intact. Mouth is well hydrated and without lesions. NECK: Supple. No masses LUNGS: Clear to auscultation. No presence of rhonchi/wheezing/rales. Adequate chest  expansion HEART: RRR, normal s1 and s2. ABDOMEN: Soft, nontender, no guarding, no peritoneal signs, and nondistended. BS +. No masses. EXTREMITIES: Without any cyanosis, clubbing, rash, lesions or edema. NEUROLOGIC: AOx3, no focal motor deficit. SKIN: no jaundice, no rashes   Imaging/Labs: as above  I personally reviewed and interpreted the available labs, imaging and endoscopic files.  Impression and Plan: KENDREW PACI is a 75 y.o. male with past medical history of coronary artery disease status post stent placement, hypothyroidism, OSA, hyperlipidemia, who presents for evaluation of rectal bleeding.  The patient has presented some episodes of painless rectal bleeding, which given his history of constipation are likely related to benign anorectal disease such as hemorrhoids or fissures.  However, the patient has not undergone a recent colonoscopy even though he had  polyps in the past. Due to this, will proceed with colonoscopy at this time.  I counseled the patient to start taking MiraLAX to improve his bowel movement frequency and soften his bowel movements as this will also help decreasing the bleeding episode frequency.  Patient understood and agreed.    - Start taking Miralax 1 cap every day for one week. If bowel movements do not improve, increase to 1 cup every 12 hours. If after two weeks there is no improvement, increase to 1 cup every 8 hours - Schedule colonoscopy  All questions were answered.      Charles Peppers, MD Gastroenterology and Hepatology [facility]

## 2020-04-09 NOTE — Patient Instructions (Signed)
Start taking Miralax 1 cap every day for one week. If bowel movements do not improve, increase to 1 cup every 12 hours. If after two weeks there is no improvement, increase to 1 cup every 8 hours Schedule colonoscopy

## 2020-04-09 NOTE — H&P (View-Only) (Signed)
[name], M.D. Gastroenterology & Hepatology [facility] [address] [place], [place] [address] Primary Care Physician: Dettinger, Fransisca Kaufmann, MD [place] [id number]  Referring MD: PCP  Chief Complaint: Rectal bleeding  History of Present Illness: [name] is a 75 y.o. male with past medical history of coronary artery disease status post stent placement, hypothyroidism, OSA, hyperlipidemia, who presents for evaluation of rectal bleeding.  Patient reports for the last 6 weeks he has presented recurrent episodes of rectal bleeding, described as bright blood on top of the stool. He has a bowel movement every 1-2 days. Stool is hard sometimes and has to strain to have a BM. He is taking stool softeners intermittently But not on a daily basis. Has had very intermittent episodes of lower abdominal pain, which are very infrequent. The patient denies having any nausea, vomiting, fever, chills, melena, hematemesis, abdominal distention, abdominal pain, diarrhea, dyschezia, jaundice, pruritus or weight loss.  Denies intake of NSAIDs, anticoagulants, high dose aspirin, or any other antiplatelet. Is only on Aspirin 81 mg qday.  Last EGD: 2015 -  Esophagus:  Mucosa of the proximal and middle third was normal. Incomplete ring noted 2 cm proximal to GE junction. Soft stricture noted at GE junction. Scope passed across it without any resistance. Esophageal stricture dilated by passing 56 French Maloney dilator disrupting mucosa at GEJ. GEJ:  38 cm Hiatus:  41 cm Stomach:  Stomach was empty and distended very well with insufflation. Folds in the proximal stomach were normal. Examination mucosa and body, antrum, pyloric channel, angularis, fundus and cardia was normal. Duodenum:  Normal bulbar and post bulbar mucosa.  Last Colonoscopy: 2015  Prep excellent. Three small polyps ablated via cold biopsy from cecum and one from  the ascending colon and these polyps were submitted together. (all TA) Few scattered diverticula at sigmoid colon. Normal rectal mucosa. Mall hemorrhoids with focal erythema noted below the dentate line.  FHx: neg for any gastrointestinal/liver disease, no malignancies Social: neg smoking, alcohol or illicit drug use Surgical: no abdominal surgeries  Past Medical History: Past Medical History:  Diagnosis Date  . Anginal pain ([place])   . Arthritis    OSTEO BACK KNEES HIPS & HANDS  . Arthropathy, unspecified, site unspecified   . Chronic airway obstruction, not elsewhere classified   . Coronary atherosclerosis of unspecified type of vessel, native or graft    a. LHC 5/16:  mLAD 20%, CFX stent ok, OM3 stent ok, mRCA 30%, normal LVF  . Headache(784.0)   . Hx of echocardiogram    a. Echo 5/16:  mild LVH, EF 60%, mildly dilated Ao root (40 mm)  . Hypothyroidism   . Other and unspecified hyperlipidemia   . Thyroid disease    hypothyroid  . Unspecified essential hypertension   . Unspecified sleep apnea    Dr. Jacelyn Grip at [facility] told pt he had sleep apnea, but never sent him for a sleep study so he doesn't wear a CPAP    Past Surgical History: Past Surgical History:  Procedure Laterality Date  . ANKLE SURGERY    . BALLOON DILATION N/A 05/02/2013   Procedure: BALLOON DILATION;  Surgeon: Rogene Houston, MD;  [facility];  Service: Endoscopy;  Laterality: N/A;  . CARDIAC CATHETERIZATION  01/15/2013  . CARDIAC CATHETERIZATION N/A 05/16/2014   Procedure: Left Heart Cath and Coronary Angiography;  Surgeon: Peter M Martinique, MD;  Location: [facility];  Service: Cardiovascular;  Laterality: N/A;  . COLONOSCOPY N/A 05/02/2013   Procedure: COLONOSCOPY;  Surgeon: Rogene Houston, MD;  Location: [facility];  Service: Endoscopy;  Laterality: N/A;  200-moved to 1200 Ann notified pt  . CORONARY STENT PLACEMENT     2003, 2004  . ESOPHAGOGASTRODUODENOSCOPY N/A 05/02/2013    Procedure: ESOPHAGOGASTRODUODENOSCOPY (EGD);  Surgeon: Rogene Houston, MD;  [facility];  Service: Endoscopy;  Laterality: N/A;  . HEMORRHOID SURGERY    . HIP ARTHROPLASTY    . KNEE SURGERY Left 12/2013  . LEFT HEART CATHETERIZATION WITH CORONARY ANGIOGRAM N/A 01/15/2013   Procedure: LEFT HEART CATHETERIZATION WITH CORONARY ANGIOGRAM;  Surgeon: Sinclair Grooms, MD;  Location: [facility] CATH LAB;  Service: Cardiovascular;  Laterality: N/A;  . MALONEY DILATION N/A 05/02/2013   Procedure: Venia Minks DILATION;  Surgeon: Rogene Houston, MD;  [facility];  Service: Endoscopy;  Laterality: N/A;  . SAVORY DILATION N/A 05/02/2013   Procedure: SAVORY DILATION;  Surgeon: Rogene Houston, MD;  [facility];  Service: Endoscopy;  Laterality: N/A;  . TONSILLECTOMY      Family History: Family History  Problem Relation Age of Onset  . Stroke Mother   . CAD Father        MI at age 13  . Colon cancer Neg Hx     Social History: Social History   Tobacco Use  Smoking Status Former Smoker  . Packs/day: 3.00  . Years: 20.00  . Pack years: 60.00  . Types: Cigarettes  . Quit date: 05/26/1975  . Years since quitting: 44.9  Smokeless Tobacco Current User  . Types: Chew   Social History   Substance and Sexual Activity  Alcohol Use No   Social History   Substance and Sexual Activity  Drug Use No    Allergies: No Known Allergies  Medications: Current Outpatient Medications  Medication Sig Dispense Refill  . albuterol (PROAIR HFA) 108 (90 BASE) MCG/ACT inhaler Inhale 2 puffs into the lungs every 6 (six) hours as needed for wheezing or shortness of breath. 1 Inhaler 2  . amLODipine (NORVASC) 10 MG tablet Take 1 tablet (10 mg total) by mouth daily. 90 tablet 3  . aspirin EC 81 MG tablet Take 1 tablet (81 mg total) by mouth daily. 90 tablet 3  . levothyroxine (SYNTHROID) 150 MCG tablet Take 1 tablet (150 mcg total) by mouth daily. 90 tablet 3  .  lisinopril-hydrochlorothiazide (ZESTORETIC) 20-25 MG tablet Take 1 tablet by mouth daily. 90 tablet 3  . oxyCODONE-acetaminophen (PERCOCET) 7.5-325 MG tablet Take 1 tablet by mouth every 6 (six) hours as needed for severe pain. 120 tablet 0  . sertraline (ZOLOFT) 50 MG tablet Take 1 tablet (50 mg total) by mouth daily. 90 tablet 3  . sildenafil (REVATIO) 20 MG tablet Take 1-3 tablets (20-60 mg total) by mouth as needed. 30 tablet 3  . simvastatin (ZOCOR) 40 MG tablet Take 1 tablet (40 mg total) by mouth at bedtime. 90 tablet 3   No current facility-administered medications for this visit.    Review of Systems: GENERAL: negative for malaise, night sweats HEENT: No changes in hearing or vision, no nose bleeds or other nasal problems. NECK: Negative for lumps, goiter, pain and significant neck swelling RESPIRATORY: Negative for cough, wheezing CARDIOVASCULAR: Negative for chest pain, leg swelling, palpitations, orthopnea GI: SEE HPI MUSCULOSKELETAL: Negative for joint pain or swelling, back pain, and muscle pain. SKIN: Negative for lesions, rash PSYCH: Negative  for sleep disturbance, mood disorder and recent psychosocial stressors. HEMATOLOGY Negative for prolonged bleeding, bruising easily, and swollen nodes. ENDOCRINE: Negative for cold or heat intolerance, polyuria, polydipsia and goiter. NEURO: negative for tremor, gait imbalance, syncope and seizures. The remainder of the review of systems is noncontributory.   Physical Exam: BP 133/81 (BP Location: Left Arm, Patient Position: Sitting, Cuff Size: Large)   Pulse 62   Temp 98.4 F (36.9 C) (Oral)   Ht 6' (1.829 m)   Wt 231 lb (104.8 kg)   BMI 31.33 kg/m  GENERAL: The patient is AO x3, in no acute distress.  Obese. HEENT: Head is normocephalic and atraumatic. EOMI are intact. Mouth is well hydrated and without lesions. NECK: Supple. No masses LUNGS: Clear to auscultation. No presence of rhonchi/wheezing/rales. Adequate chest  expansion HEART: RRR, normal s1 and s2. ABDOMEN: Soft, nontender, no guarding, no peritoneal signs, and nondistended. BS +. No masses. EXTREMITIES: Without any cyanosis, clubbing, rash, lesions or edema. NEUROLOGIC: AOx3, no focal motor deficit. SKIN: no jaundice, no rashes   Imaging/Labs: as above  I personally reviewed and interpreted the available labs, imaging and endoscopic files.  Impression and Plan: KAYLE CORREA is a 75 y.o. male with past medical history of coronary artery disease status post stent placement, hypothyroidism, OSA, hyperlipidemia, who presents for evaluation of rectal bleeding.  The patient has presented some episodes of painless rectal bleeding, which given his history of constipation are likely related to benign anorectal disease such as hemorrhoids or fissures.  However, the patient has not undergone a recent colonoscopy even though he had  polyps in the past. Due to this, will proceed with colonoscopy at this time.  I counseled the patient to start taking MiraLAX to improve his bowel movement frequency and soften his bowel movements as this will also help decreasing the bleeding episode frequency.  Patient understood and agreed.    - Start taking Miralax 1 cap every day for one week. If bowel movements do not improve, increase to 1 cup every 12 hours. If after two weeks there is no improvement, increase to 1 cup every 8 hours - Schedule colonoscopy  All questions were answered.      Charles Peppers, MD Gastroenterology and Hepatology [facility]

## 2020-04-09 NOTE — Telephone Encounter (Signed)
LeighAnn Lucillie Kiesel, CMA  

## 2020-04-13 ENCOUNTER — Ambulatory Visit (INDEPENDENT_AMBULATORY_CARE_PROVIDER_SITE_OTHER): Payer: Medicare Other

## 2020-04-13 VITALS — Ht 72.0 in | Wt 230.0 lb

## 2020-04-13 DIAGNOSIS — Z0000 Encounter for general adult medical examination without abnormal findings: Secondary | ICD-10-CM | POA: Diagnosis not present

## 2020-04-13 NOTE — Progress Notes (Signed)
Subjective:   [name] is a 75 y.o. male who presents for Medicare Annual/Subsequent preventive examination.  Virtual Visit via Telephone Note  I connected with  [name] on 04/13/20 at  8:15 AM EDT by telephone and verified that I am speaking with the correct person using two identifiers.  Location: Patient: Home Provider: [facility] Persons participating in the virtual visit: patient/Nurse Health Advisor   I discussed the limitations, risks, security and privacy concerns of performing an evaluation and management service by telephone and the availability of in person appointments. The patient expressed understanding and agreed to proceed.  Interactive audio and video telecommunications were attempted between this nurse and patient, however failed, due to patient having technical difficulties OR patient did not have access to video capability.  We continued and completed visit with audio only.  Some vital signs may be absent or patient reported.   Aristotle Lieb E Lona Six, LPN   Review of Systems     Cardiac Risk Factors include: advanced age (>70men, >56 women);male gender;obesity (BMI >30kg/m2);dyslipidemia;hypertension     Objective:    Today's Vitals   04/13/20 0810  Weight: 230 lb (104.3 kg)  Height: 6' (1.829 m)   Body mass index is 31.19 kg/m.  Advanced Directives 04/13/2020 04/11/2019 12/12/2017 12/07/2016 05/15/2014 05/02/2013 01/15/2013  Does Patient Have a Medical Advance Directive? No No No No No Patient does not have advance directive;Patient would not like information Patient does not have advance directive  Would patient like information on creating a medical advance directive? Yes (MAU/Ambulatory/Procedural Areas - Information given) No - Patient declined No - Patient declined No - Patient declined No - patient declined information - -  Pre-existing out of facility DNR order (yellow form or pink MOST form) - - - - - No -    Current Medications  (verified) Outpatient Encounter Medications as of 04/13/2020  Medication Sig  . albuterol (PROAIR HFA) 108 (90 BASE) MCG/ACT inhaler Inhale 2 puffs into the lungs every 6 (six) hours as needed for wheezing or shortness of breath.  Marland Kitchen amLODipine (NORVASC) 10 MG tablet Take 1 tablet (10 mg total) by mouth daily.  Marland Kitchen aspirin EC 81 MG tablet Take 1 tablet (81 mg total) by mouth daily.  Marland Kitchen levothyroxine (SYNTHROID) 150 MCG tablet Take 1 tablet (150 mcg total) by mouth daily.  Marland Kitchen lisinopril-hydrochlorothiazide (ZESTORETIC) 20-25 MG tablet Take 1 tablet by mouth daily.  Marland Kitchen oxyCODONE-acetaminophen (PERCOCET) 7.5-325 MG tablet Take 1 tablet by mouth every 6 (six) hours as needed for severe pain.  . polyethylene glycol-electrolytes (TRILYTE) 420 g solution Take 4,000 mLs by mouth as directed.  . sertraline (ZOLOFT) 50 MG tablet Take 1 tablet (50 mg total) by mouth daily.  . sildenafil (REVATIO) 20 MG tablet Take 1-3 tablets (20-60 mg total) by mouth as needed.  . simvastatin (ZOCOR) 40 MG tablet Take 1 tablet (40 mg total) by mouth at bedtime.   No facility-administered encounter medications on file as of 04/13/2020.    Allergies (verified) Patient has no known allergies.   History: Past Medical History:  Diagnosis Date  . Anginal pain ([place])   . Arthritis    OSTEO BACK KNEES HIPS & HANDS  . Arthropathy, unspecified, site unspecified   . Chronic airway obstruction, not elsewhere classified   . Coronary atherosclerosis of unspecified type of vessel, native or graft    a. LHC 5/16:  mLAD 20%, CFX stent ok, OM3 stent ok, mRCA 30%, normal LVF  . Headache(784.0)   .  Hx of echocardiogram    a. Echo 5/16:  mild LVH, EF 60%, mildly dilated Ao root (40 mm)  . Hypothyroidism   . Other and unspecified hyperlipidemia   . Thyroid disease    hypothyroid  . Unspecified essential hypertension   . Unspecified sleep apnea    [facility] Jacelyn [facility]rip at [facility] told pt he had sleep apnea, but never sent him for a sleep  study so he doesn't wear a CPAP   Past Surgical History:  Procedure Laterality Date  . ANKLE SURGERY    . BALLOON DILATION N/A 05/02/2013   Procedure: BALLOON DILATION;  Surgeon: Rogene Houston, MD;  [facility];  Service: Endoscopy;  Laterality: N/A;  . CARDIAC CATHETERIZATION  01/15/2013  . CARDIAC CATHETERIZATION N/A 05/16/2014   Procedure: Left Heart Cath and Coronary Angiography;  Surgeon: Peter M Martinique, MD;  Location: [place] [facility];  Service: Cardiovascular;  Laterality: N/A;  . COLONOSCOPY N/A 05/02/2013   Procedure: COLONOSCOPY;  Surgeon: Rogene Houston, MD;  [facility];  Service: Endoscopy;  Laterality: N/A;  200-moved to 1200 Ann notified pt  . CORONARY STENT PLACEMENT     2003, 2004  . ESOPHAGOGASTRODUODENOSCOPY N/A 05/02/2013   Procedure: ESOPHAGOGASTRODUODENOSCOPY (EGD);  Surgeon: Rogene Houston, MD;  [facility];  Service: Endoscopy;  Laterality: N/A;  . HEMORRHOID SURGERY    . HIP ARTHROPLASTY    . KNEE SURGERY Left 12/2013  . LEFT HEART CATHETERIZATION WITH CORONARY ANGIOGRAM N/A 01/15/2013   Procedure: LEFT HEART CATHETERIZATION WITH CORONARY ANGIOGRAM;  Surgeon: Sinclair Grooms, MD;  Location[facility];  Service: Cardiovascular;  Laterality: N/A;  . MALONEY DILATION N/A 05/02/2013   Procedure: Venia Minks DILATION;  Surgeon: Rogene Houston, MD;  [facility];  Service: Endoscopy;  Laterality: N/A;  . SAVORY DILATION N/A 05/02/2013   Procedure: SAVORY DILATION;  Surgeon: Rogene Houston, MD;  [facility];  Service: Endoscopy;  Laterality: N/A;  . TONSILLECTOMY     Family History  Problem Relation Age of Onset  . Stroke Mother   . CAD Father        MI at age 41  . Colon cancer Neg Hx    Social History   Socioeconomic History  . Marital status: Widowed    Spouse name: Not on file  . Number of children: 2  . Years of education: Not on file  . Highest education level: 11th grade  Occupational  History  . Occupation: Company secretary: Journalist, newspaper  . Occupation: Retired   Tobacco Use  . Smoking status: Former Smoker    Packs/day: 3.00    Years: 20.00    Pack years: 60.00    Types: Cigarettes    Quit date: 05/26/1975    Years since quitting: 44.9  . Smokeless tobacco: Current User    Types: Chew  Vaping Use  . Vaping Use: Never used  Substance and Sexual Activity  . Alcohol use: No  . Drug use: No  . Sexual activity: Not Currently  Other Topics Concern  . 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 Grandson lives with him.   Social Determinants of Health   Financial Resource Strain: Low Risk   . Difficulty of Paying Living Expenses: Not very hard  Food Insecurity: No Food Insecurity  . Worried About Running  Out of Food in the Last Year: Never true  . Ran Out of Food in the Last Year: Never true  Transportation Needs: No Transportation Needs  . Lack of Transportation (Medical): No  . Lack of Transportation (Non-Medical): No  Physical Activity: Insufficiently Active  . Days of Exercise per Week: 3 days  . Minutes of Exercise per Session: 30 min  Stress: No Stress Concern Present  . Feeling of Stress : Not at all  Social Connections: Moderately Integrated  . Frequency of Communication with Friends and Family: More than three times a week  . Frequency of Social Gatherings with Friends and Family: More than three times a week  . Attends Religious Services: More than 4 times per year  . Active Member of Clubs or Organizations: Yes  . Attends Archivist Meetings: More than 4 times per year  . Marital Status: Widowed    Tobacco Counseling Ready to quit: Not Answered Counseling given: Not Answered   Clinical Intake:  Pre-visit preparation completed: Yes  Pain : No/denies pain     BMI - recorded: 31.19 Nutritional Status: BMI > 30   Obese Nutritional Risks: None Diabetes: No  How often do you need to have someone help you when you read instructions, pamphlets, or other written materials from your doctor or pharmacy?: 1 - Never  Diabetic? No  Interpreter Needed?: No  Information entered by :: Jonte Shiller, LPN   Activities of Daily Living In your present state of health, do you have any difficulty performing the following activities: 04/13/2020  Hearing? Y  Comment cannot afford hearing aids - declines assistance at this time  Vision? N  Difficulty concentrating or making decisions? N  Walking or climbing stairs? N  Dressing or bathing? N  Doing errands, shopping? N  Preparing Food and eating ? N  Using the Toilet? N  In the past six months, have you accidently leaked urine? N  Do you have problems with loss of bowel control? N  Managing your Medications? N  Managing your Finances? N  Housekeeping or managing your Housekeeping? N  Some recent data might be hidden    Patient Care Team: Dettinger, Fransisca Kaufmann, MD as PCP - General (Family Medicine) Martinique, Peter M, MD as Consulting Physician (Cardiology) Juanito Doom, MD as Consulting Physician (Pulmonary Disease) Harlen Labs, MD as Referring Physician (Optometry)  Indicate any recent Medical Services you may have received from other than [facility] providers in the past year (date may be approximate).     Assessment:   This is a routine wellness examination for Blue.  Hearing/Vision screen  Hearing Screening   125Hz  250Hz  500Hz  1000Hz  2000Hz  3000Hz  4000Hz  6000Hz  8000Hz   Right ear:           Left ear:           Comments: Moderate hearing loss - can't afford hearing aids.  Vision Screening Comments: Wears eyeglasses - Annual visits with Dr Marin Comment in [facility]  Dietary issues and exercise activities discussed: Current Exercise Habits: Home exercise routine, Type of exercise: walking;Other - see comments (yard work), Time (Minutes): 30,  Frequency (Times/Week): 3, Weekly Exercise (Minutes/Week): 90, Intensity: Mild, Exercise limited by: None identified  Goals    .  DIET - INCREASE WATER INTAKE      Drink at least 6-8 glasses a day     .  Increase physical activity (pt-stated)      Pt used to lift weights and workout until  3 years ago when his wife got sick and he was her caretaker. He would like to start slow and become more active again.      Depression Screen PHQ 2/9 Scores 04/13/2020 03/19/2020 12/19/2019 09/27/2019 06/19/2019 04/11/2019 03/27/2019  PHQ - 2 Score 0 0 0 0 0 0 0  PHQ- 9 Score - - - - - - -  Exception Documentation - - - - - - -  Not completed - - - - - - -    Fall Risk Fall Risk  04/13/2020 03/19/2020 12/19/2019 09/27/2019 06/19/2019  Falls in the past year? - 0 0 0 0  Number falls in past yr: 0 - - - -  Injury with Fall? 0 - - - -  Risk for fall due to : No Fall Risks - - - -    FALL RISK PREVENTION PERTAINING TO THE HOME:  Any stairs in or around the home? Yes  If so, are there any without handrails? No  Home free of loose throw rugs in walkways, pet beds, electrical cords, etc? Yes  Adequate lighting in your home to reduce risk of falls? Yes   ASSISTIVE DEVICES UTILIZED TO PREVENT FALLS:  Life alert? No  Use of a cane, walker or w/c? No  Grab bars in the bathroom? No  Shower chair or bench in shower? No  Elevated toilet seat or a handicapped toilet? No   TIMED UP AND GO:  Was the test performed? No . Telephonic visit.  Cognitive Function: MMSE - Mini Mental State Exam 12/12/2017 12/07/2016  Orientation to time 5 5  Orientation to Place 5 5  Registration 3 3  Attention/ Calculation 3 2  Recall 3 2  Language- name 2 objects 2 2  Language- repeat 1 1  Language- follow 3 step command 3 3  Language- read & follow direction 1 1  Write a sentence 1 1  Copy design 1 1  Total score 28 26     6CIT Screen 04/11/2019 04/11/2019  What Year? 0 points 0 points  What month? 3 points 3 points  What  time? 0 points 0 points  Count back from 20 0 points -  Months in reverse 2 points -  Repeat phrase 0 points -  Total Score 5 -    Immunizations Immunization History  Administered Date(s) Administered  . Fluad Quad(high Dose 65+) 09/20/2018  . Influenza Inj Mdck Quad With Preservative 09/29/2017  . Influenza Split 10/06/2012  . Influenza, High Dose Seasonal PF 09/14/2013  . Influenza,inj,Quad PF,6+ Mos 10/01/2014, 09/30/2016  . Influenza-Unspecified 10/07/2015, 10/02/2017  . Pneumococcal Conjugate-13 12/24/2012  . Pneumococcal Polysaccharide-23 10/02/2017  . Pneumococcal-Unspecified 10/02/2017  . Tdap 04/04/2013    TDAP status: Up to date  Flu Vaccine status: Declined, Education has been provided regarding the importance of this vaccine but patient still declined. Advised may receive this vaccine at local pharmacy or Health Dept. Aware to provide a copy of the vaccination record if obtained from local pharmacy or Health Dept. Verbalized acceptance and understanding.  Pneumococcal vaccine status: Up to date  Covid-19 vaccine status: Declined, Education has been provided regarding the importance of this vaccine but patient still declined. Advised may receive this vaccine at local pharmacy or Health Dept.or vaccine clinic. Aware to provide a copy of the vaccination record if obtained from local pharmacy or Health Dept. Verbalized acceptance and understanding.  Qualifies for Shingles Vaccine? Yes   Zostavax completed No   Shingrix Completed?: No.  Education has been provided regarding the importance of this vaccine. Patient has been advised to call insurance company to determine out of pocket expense if they have not yet received this vaccine. Advised may also receive vaccine at local pharmacy or Health Dept. Verbalized acceptance and understanding.  Screening Tests Health Maintenance  Topic Date Due  . COLONOSCOPY (Pts 45-54yrs Insurance coverage will need to be confirmed)   07/20/2020 (Originally 05/03/2018)  . INFLUENZA VACCINE  08/03/2020  . TETANUS/TDAP  04/05/2023  . Hepatitis C Screening  Completed  . PNA vac Low Risk Adult  Completed  . HPV VACCINES  Aged Out  . COVID-19 Vaccine  Discontinued    Health Maintenance  There are no preventive care reminders to display for this patient.  Colorectal cancer screening: Type of screening: Colonoscopy. Completed 05/02/2013. Repeat every 5 years Patient has one scheduled for May 2022.  Lung Cancer Screening: (Low Dose CT Chest recommended if Age 9-80 years, 30 pack-year currently smoking OR have quit w/in 15years.) does not qualify.   Additional Screening:  Hepatitis C Screening: does qualify; Completed 09/27/2019  Vision Screening: Recommended annual ophthalmology exams for early detection of glaucoma and other disorders of the eye. Is the patient up to date with their annual eye exam?  Yes  Who is the provider or what is the name of the office in which the patient attends annual eye exams? Dr Marin Comment in [facility] If pt is not established with a provider, would they like to be referred to a provider to establish care? No .   Dental Screening: Recommended annual dental exams for proper oral hygiene  Community Resource Referral / Chronic Care Management: CRR required this visit?  No   CCM required this visit?  No      Plan:     I have personally reviewed and noted the following in the patient's chart:   . Medical and social history . Use of alcohol, tobacco or illicit drugs  . Current medications and supplements . Functional ability and status . Nutritional status . Physical activity . Advanced directives . List of other physicians . Hospitalizations, surgeries, and ER visits in previous 12 months . Vitals . Screenings to include cognitive, depression, and falls . Referrals and appointments  In addition, I have reviewed and discussed with patient certain preventive protocols, quality  metrics, and best practice recommendations. A written personalized care plan for preventive services as well as general preventive health recommendations were provided to patient.     Sandrea Hammond, LPN   [id number]   Nurse Notes: None

## 2020-04-13 NOTE — Patient Instructions (Signed)
[name] , Thank you for taking time to come for your Medicare Wellness Visit. I appreciate your ongoing commitment to your health goals. Please review the following plan we discussed and let me know if I can assist you in the future.   Screening recommendations/referrals: Colonoscopy: Done 05/02/2013 - Repeat in 5 years - He has one scheduled for May 2022 Recommended yearly ophthalmology/optometry visit for glaucoma screening and checkup Recommended yearly dental visit for hygiene and checkup  Vaccinations: Influenza vaccine: Declined (last one made him sick) Pneumococcal vaccine: Done 12/24/2012 & 10/02/2017 Tdap vaccine: Done 04/04/2013 Repeat in 10 years Shingles vaccine: Shingrix discussed. Please contact your pharmacy for coverage information.    Covid-19: Declined  Advanced directives: Advance directive discussed with you today. I have provided a copy for you to complete at home and have notarized. Once this is complete please bring a copy in to our office so we can scan it into your chart.  Conditions/risks identified: Continue staying active; Aim for 30 minutes of physical activity every day.  Next appointment: Follow up in one year for your annual wellness visit.   Preventive Care 28 Years and Older, Male  Preventive care refers to lifestyle choices and visits with your health care provider that can promote health and wellness. What does preventive care include?  A yearly physical exam. This is also called an annual well check.  Dental exams once or twice a year.  Routine eye exams. Ask your health care provider how often you should have your eyes checked.  Personal lifestyle choices, including:  Daily care of your teeth and gums.  Regular physical activity.  Eating a healthy diet.  Avoiding tobacco and drug use.  Limiting alcohol use.  Practicing safe sex.  Taking low doses of aspirin every day.  Taking vitamin and mineral supplements as recommended by your  health care provider. What happens during an annual well check? The services and screenings done by your health care provider during your annual well check will depend on your age, overall health, lifestyle risk factors, and family history of disease. Counseling  Your health care provider may ask you questions about your:  Alcohol use.  Tobacco use.  Drug use.  Emotional well-being.  Home and relationship well-being.  Sexual activity.  Eating habits.  History of falls.  Memory and ability to understand (cognition).  Work and work Statistician. Screening  You may have the following tests or measurements:  Height, weight, and BMI.  Blood pressure.  Lipid and cholesterol levels. These may be checked every 5 years, or more frequently if you are over 56 years old.  Skin check.  Lung cancer screening. You may have this screening every year starting at age 28 if you have a 30-pack-year history of smoking and currently smoke or have quit within the past 15 years.  Fecal occult blood test (FOBT) of the stool. You may have this test every year starting at age 42.  Flexible sigmoidoscopy or colonoscopy. You may have a sigmoidoscopy every 5 years or a colonoscopy every 10 years starting at age 40.  Prostate cancer screening. Recommendations will vary depending on your family history and other risks.  Hepatitis C blood test.  Hepatitis B blood test.  Sexually transmitted disease (STD) testing.  Diabetes screening. This is done by checking your blood sugar (glucose) after you have not eaten for a while (fasting). You may have this done every 1-3 years.  Abdominal aortic aneurysm (AAA) screening. You may need this if  you are a current or former smoker.  Osteoporosis. You may be screened starting at age 42 if you are at high risk. Talk with your health care provider about your test results, treatment options, and if necessary, the need for more tests. Vaccines  Your health  care provider may recommend certain vaccines, such as:  Influenza vaccine. This is recommended every year.  Tetanus, diphtheria, and acellular pertussis (Tdap, Td) vaccine. You may need a Td booster every 10 years.  Zoster vaccine. You may need this after age 48.  Pneumococcal 13-valent conjugate (PCV13) vaccine. One dose is recommended after age 4.  Pneumococcal polysaccharide (PPSV23) vaccine. One dose is recommended after age 77. Talk to your health care provider about which screenings and vaccines you need and how often you need them. This information is not intended to replace advice given to you by your health care provider. Make sure you discuss any questions you have with your health care provider. Document Released: 01/16/2015 Document Revised: 09/09/2015 Document Reviewed: 10/21/2014 Elsevier Interactive Patient Education  2017 [place] Prevention in the Home Falls can cause injuries. They can happen to people of all ages. There are many things you can do to make your home safe and to help prevent falls. What can I do on the outside of my home?  Regularly fix the edges of walkways and driveways and fix any cracks.  Remove anything that might make you trip as you walk through a door, such as a raised step or threshold.  Trim any bushes or trees on the path to your home.  Use bright outdoor lighting.  Clear any walking paths of anything that might make someone trip, such as rocks or tools.  Regularly check to see if handrails are loose or broken. Make sure that both sides of any steps have handrails.  Any raised decks and porches should have guardrails on the edges.  Have any leaves, snow, or ice cleared regularly.  Use sand or salt on walking paths during winter.  Clean up any spills in your garage right away. This includes oil or grease spills. What can I do in the bathroom?  Use night lights.  Install grab bars by the toilet and in the tub and  shower. Do not use towel bars as grab bars.  Use non-skid mats or decals in the tub or shower.  If you need to sit down in the shower, use a plastic, non-slip stool.  Keep the floor dry. Clean up any water that spills on the floor as soon as it happens.  Remove soap buildup in the tub or shower regularly.  Attach bath mats securely with double-sided non-slip rug tape.  Do not have throw rugs and other things on the floor that can make you trip. What can I do in the bedroom?  Use night lights.  Make sure that you have a light by your bed that is easy to reach.  Do not use any sheets or blankets that are too big for your bed. They should not hang down onto the floor.  Have a firm chair that has side arms. You can use this for support while you get dressed.  Do not have throw rugs and other things on the floor that can make you trip. What can I do in the kitchen?  Clean up any spills right away.  Avoid walking on wet floors.  Keep items that you use a lot in easy-to-reach places.  If you need to  reach something above you, use a strong step stool that has a grab bar.  Keep electrical cords out of the way.  Do not use floor polish or wax that makes floors slippery. If you must use wax, use non-skid floor wax.  Do not have throw rugs and other things on the floor that can make you trip. What can I do with my stairs?  Do not leave any items on the stairs.  Make sure that there are handrails on both sides of the stairs and use them. Fix handrails that are broken or loose. Make sure that handrails are as long as the stairways.  Check any carpeting to make sure that it is firmly attached to the stairs. Fix any carpet that is loose or worn.  Avoid having throw rugs at the top or bottom of the stairs. If you do have throw rugs, attach them to the floor with carpet tape.  Make sure that you have a light switch at the top of the stairs and the bottom of the stairs. If you do not  have them, ask someone to add them for you. What else can I do to help prevent falls?  Wear shoes that:  Do not have high heels.  Have rubber bottoms.  Are comfortable and fit you well.  Are closed at the toe. Do not wear sandals.  If you use a stepladder:  Make sure that it is fully opened. Do not climb a closed stepladder.  Make sure that both sides of the stepladder are locked into place.  Ask someone to hold it for you, if possible.  Clearly mark and make sure that you can see:  Any grab bars or handrails.  First and last steps.  Where the edge of each step is.  Use tools that help you move around (mobility aids) if they are needed. These include:  Canes.  Walkers.  Scooters.  Crutches.  Turn on the lights when you go into a dark area. Replace any light bulbs as soon as they burn out.  Set up your furniture so you have a clear path. Avoid moving your furniture around.  If any of your floors are uneven, fix them.  If there are any pets around you, be aware of where they are.  Review your medicines with your doctor. Some medicines can make you feel dizzy. This can increase your chance of falling. Ask your doctor what other things that you can do to help prevent falls. This information is not intended to replace advice given to you by your health care provider. Make sure you discuss any questions you have with your health care provider. Document Released: 10/16/2008 Document Revised: 05/28/2015 Document Reviewed: 01/24/2014 Elsevier Interactive Patient Education  2017 [employer].

## 2020-04-23 ENCOUNTER — Other Ambulatory Visit: Payer: Self-pay | Admitting: Family Medicine

## 2020-04-23 DIAGNOSIS — G8929 Other chronic pain: Secondary | ICD-10-CM

## 2020-04-23 DIAGNOSIS — M4712 Other spondylosis with myelopathy, cervical region: Secondary | ICD-10-CM

## 2020-04-23 DIAGNOSIS — Z0289 Encounter for other administrative examinations: Secondary | ICD-10-CM

## 2020-04-28 ENCOUNTER — Telehealth: Payer: Self-pay | Admitting: Family Medicine

## 2020-04-28 NOTE — Telephone Encounter (Signed)
  Prescription Request  04/28/2020  What is the name of the medication or equipment? Thyroid meds   Have you contacted your pharmacy to request a refill? (if applicable) yes  Which pharmacy would you like this sent to? [facility] in message for Lattie Haw - not sure of med name*  Patient notified that their request is being sent to the clinical staff for review and that they should receive a response within 2 business days.

## 2020-04-29 ENCOUNTER — Other Ambulatory Visit: Payer: Self-pay

## 2020-04-29 ENCOUNTER — Encounter: Payer: Self-pay | Admitting: Family Medicine

## 2020-04-29 ENCOUNTER — Ambulatory Visit (INDEPENDENT_AMBULATORY_CARE_PROVIDER_SITE_OTHER): Payer: Medicare Other | Admitting: Family Medicine

## 2020-04-29 VITALS — Ht 72.0 in | Wt 227.0 lb

## 2020-04-29 DIAGNOSIS — K921 Melena: Secondary | ICD-10-CM

## 2020-04-29 DIAGNOSIS — R42 Dizziness and giddiness: Secondary | ICD-10-CM

## 2020-04-29 DIAGNOSIS — E079 Disorder of thyroid, unspecified: Secondary | ICD-10-CM | POA: Diagnosis not present

## 2020-04-29 MED ORDER — LEVOTHYROXINE SODIUM 150 MCG PO TABS: 150.0000 ug | ORAL_TABLET | Freq: Every day | ORAL | 1 refills | Status: DC

## 2020-04-29 MED ORDER — LEVOTHYROXINE SODIUM 150 MCG PO TABS: 150.0000 ug | ORAL_TABLET | Freq: Every day | ORAL | 0 refills | Status: DC

## 2020-04-29 MED ORDER — LISINOPRIL 20 MG PO TABS: 20.0000 mg | ORAL_TABLET | Freq: Every day | ORAL | 3 refills | Status: DC

## 2020-04-29 NOTE — Progress Notes (Signed)
Ht 6' (1.829 m)   Wt 227 lb (103 kg)   SpO2 95%   BMI 30.79 kg/m    Subjective:   Patient ID: [name], male    DOB: 07-14-1945, 75 y.o.   MRN: [id number]  HPI: [name] is a 75 y.o. male presenting on 04/29/2020 for Dizziness (Has frequently. All day and night. Occurs when he stands or sits up quickly.)   HPI Dizziness Patient is coming in today for dizziness.  He has been feeling like he gets dizzy all day and all night and especially when he stands up.  It did start 3 weeks ago and he noticed some bright red blood in his stool and he called up his gastroenterologist and they do have him scheduled for colonoscopy next week.  He was having some constipation by taking MiraLAX every day and that stopped and he has not noticed bright red blood per rectum in the last 3 days.  He is having looser stool now with the MiraLAX.  He still feels lightheaded and dizzy.  He also admits has been out of his thyroid medicine for more than a month.  Patient says he does get dizzy when he stands up quick.  He denies any chest pain or trouble breathing.  Relevant past medical, surgical, family and social history reviewed and updated as indicated. Interim medical history since our last visit reviewed. Allergies and medications reviewed and updated.  Review of Systems  Constitutional: Positive for fatigue. Negative for chills and fever.  Eyes: Negative for visual disturbance.  Respiratory: Negative for shortness of breath and wheezing.   Cardiovascular: Negative for chest pain and leg swelling.  Gastrointestinal: Positive for blood in stool, constipation and diarrhea. Negative for abdominal pain, nausea and vomiting.  Musculoskeletal: Negative for back pain and gait problem.  Skin: Negative for rash.  Neurological: Positive for dizziness and light-headedness. Negative for speech difficulty, weakness and numbness.  All other systems reviewed and are negative.   Per HPI unless  specifically indicated above   Allergies as of 04/29/2020   No Known Allergies     Medication List       Accurate as of April 29, 2020  2:14 PM. If you have any questions, ask your nurse or doctor.        STOP taking these medications   lisinopril-hydrochlorothiazide 20-25 MG tablet Commonly known as: ZESTORETIC Stopped by: Fransisca Kaufmann Sara Selvidge, MD     TAKE these medications   albuterol 108 (90 Base) MCG/ACT inhaler Commonly known as: ProAir HFA Inhale 2 puffs into the lungs every 6 (six) hours as needed for wheezing or shortness of breath.   amLODipine 10 MG tablet Commonly known as: NORVASC Take 1 tablet (10 mg total) by mouth daily.   aspirin EC 81 MG tablet Take 1 tablet (81 mg total) by mouth daily.   levothyroxine 150 MCG tablet Commonly known as: SYNTHROID Take 1 tablet (150 mcg total) by mouth daily.   lisinopril 20 MG tablet Commonly known as: ZESTRIL Take 1 tablet (20 mg total) by mouth daily. Started by: Worthy Rancher, MD   multivitamin with minerals Tabs tablet Take 1 tablet by mouth daily.   oxyCODONE-acetaminophen 7.5-325 MG tablet Commonly known as: Percocet Take 1 tablet by mouth every 6 (six) hours as needed for severe pain.   polyethylene glycol-electrolytes 420 g solution Commonly known as: TriLyte Take 4,000 mLs by mouth as directed.   sertraline 50 MG tablet Commonly known as: Zoloft  Take 1 tablet (50 mg total) by mouth daily.   sildenafil 20 MG tablet Commonly known as: REVATIO Take 1-3 tablets (20-60 mg total) by mouth as needed.   simvastatin 40 MG tablet Commonly known as: ZOCOR Take 1 tablet (40 mg total) by mouth at bedtime.        Objective:   Ht 6' (1.829 m)   Wt 227 lb (103 kg)   SpO2 95%   BMI 30.79 kg/m   Wt Readings from Last 3 Encounters:  04/29/20 227 lb (103 kg)  04/13/20 230 lb (104.3 kg)  04/09/20 231 lb (104.8 kg)    Physical Exam Vitals and nursing note reviewed.  Constitutional:      General:  He is not in acute distress.    Appearance: He is well-developed. He is not diaphoretic.  Eyes:     General: No scleral icterus.    Conjunctiva/sclera: Conjunctivae normal.  Neck:     Thyroid: No thyromegaly.  Cardiovascular:     Rate and Rhythm: Normal rate and regular rhythm.     Heart sounds: Normal heart sounds. No murmur heard.   Pulmonary:     Effort: Pulmonary effort is normal. No respiratory distress.     Breath sounds: Normal breath sounds. No wheezing.  Abdominal:     General: Abdomen is flat. Bowel sounds are normal. There is no distension.     Tenderness: There is no abdominal tenderness. There is no guarding or rebound.  Musculoskeletal:        General: No swelling. Normal range of motion.     Cervical back: Neck supple.  Lymphadenopathy:     Cervical: No cervical adenopathy.  Skin:    General: Skin is warm and dry.     Findings: No rash.  Neurological:     Mental Status: He is alert and oriented to person, place, and time.     Coordination: Coordination normal.  Psychiatric:        Behavior: Behavior normal.     Blood pressure dropped 20 points from laying down to sitting to standing, laying down systolic was 825 and standing up to systolic was 98.  Assessment & Plan:   Problem List Items Addressed This Visit      Endocrine   Thyroid disease - Primary   Relevant Medications   levothyroxine (SYNTHROID) 150 MCG tablet   Other Relevant Orders   CBC with Differential/Platelet    Other Visit Diagnoses    Blood in stool       Relevant Orders   CBC with Differential/Platelet   CMP14+EGFR   Orthostatic dizziness       Relevant Medications   lisinopril (ZESTRIL) 20 MG tablet   Other Relevant Orders   CBC with Differential/Platelet   CMP14+EGFR      Patient has blood in his stool and dizziness and orthostatic dizziness on blood pressure.  We will cut back on his blood pressure medicine and take away the hydrochlorothiazide and just keep him on the  lisinopril and the amlodipine that he has been on.  We will restart his thyroid medicine which may be playing a factor as well.  Patient has a colonoscopy scheduled for 2 weeks to look at the bright red blood per rectum and see if he is still having any blood loss.  All of these could be contributing factors and we will check his blood counts for anemia restart him on the thyroid and lower his blood pressure medicine. Follow up plan: Return in  about 4 weeks (around 05/27/2020), or if symptoms worsen or fail to improve, for Hypertension and dizziness.  Counseling provided for all of the vaccine components Orders Placed This Encounter  Procedures  . CBC with Differential/Platelet  . [place], MD [facility] 04/29/2020, 2:14 PM

## 2020-04-29 NOTE — Telephone Encounter (Signed)
Pt has not been on his thyroid medication, refill sent to pharmacy so that he can restart it, he has an appt today with Dr Dettinger and will let him know this

## 2020-04-30 LAB — CMP14+EGFR
ALT: 29 IU/L (ref 0–44)
AST: 43 IU/L — ABNORMAL HIGH (ref 0–40)
Albumin/Globulin Ratio: 2.2 (ref 1.2–2.2)
Albumin: 4.7 g/dL (ref 3.7–4.7)
Alkaline Phosphatase: 51 IU/L (ref 44–121)
BUN/Creatinine Ratio: 11 (ref 10–24)
BUN: 14 mg/dL (ref 8–27)
Bilirubin Total: 0.3 mg/dL (ref 0.0–1.2)
CO2: 24 mmol/L (ref 20–29)
Calcium: 9.6 mg/dL (ref 8.6–10.2)
Chloride: 101 mmol/L (ref 96–106)
Creatinine, Ser: 1.23 mg/dL (ref 0.76–1.27)
Globulin, Total: 2.1 g/dL (ref 1.5–4.5)
Glucose: 111 mg/dL — ABNORMAL HIGH (ref 65–99)
Potassium: 3.9 mmol/L (ref 3.5–5.2)
Sodium: 141 mmol/L (ref 134–144)
Total Protein: 6.8 g/dL (ref 6.0–8.5)
eGFR: 61 mL/min/{1.73_m2} (ref >59)

## 2020-04-30 LAB — CBC WITH DIFFERENTIAL/PLATELET
Basophils Absolute: 0 10*3/uL (ref 0.0–0.2)
Basos: 0 %
EOS (ABSOLUTE): 0.2 10*3/uL (ref 0.0–0.4)
Eos: 3 %
Hematocrit: 38.3 % (ref 37.5–51.0)
Hemoglobin: 12.9 g/dL — ABNORMAL LOW (ref 13.0–17.7)
Immature Grans (Abs): 0 10*3/uL (ref 0.0–0.1)
Immature Granulocytes: 0 %
Lymphocytes Absolute: 2 10*3/uL (ref 0.7–3.1)
Lymphs: 28 %
MCH: 33.1 pg — ABNORMAL HIGH (ref 26.6–33.0)
MCHC: 33.7 g/dL (ref 31.5–35.7)
MCV: 98 fL — ABNORMAL HIGH (ref 79–97)
Monocytes Absolute: 0.7 10*3/uL (ref 0.1–0.9)
Monocytes: 9 %
Neutrophils Absolute: 4.4 10*3/uL (ref 1.4–7.0)
Neutrophils: 60 %
Platelets: 289 10*3/uL (ref 150–450)
RBC: 3.9 x10E6/uL — ABNORMAL LOW (ref 4.14–5.80)
RDW: 13.7 % (ref 11.6–15.4)
WBC: 7.3 10*3/uL (ref 3.4–10.8)

## 2020-05-05 NOTE — Patient Instructions (Signed)
[name]  05/05/2020     @PREFPERIOPPHARMACY @   Your procedure is scheduled on  05/08/2020.   Report to [name] at  0700  A.M.   Call this number if you have problems the morning of surgery:  [phone]   Remember:  Follow the diet and prep instructions given to you by the office.                     Take these medicines the morning of surgery with A SIP OF WATER  Amlodipine, synthroid, oxycodone (if needed), zoloft.  Use your inhaler before you come and bring your rescue inhaler with you.     Please brush your teeth.   Do not wear jewelry, make-up or nail polish.  Do not wear lotions, powders, or perfumes, or deodorant.  Do not shave 48 hours prior to surgery.  Men may shave face and neck.  Do not bring valuables to the hospital.  [facility] is not responsible for any belongings or valuables.  Contacts, dentures or bridgework may not be worn into surgery.  Leave your suitcase in the car.  After surgery it may be brought to your room.  For patients admitted to the hospital, discharge time will be determined by your treatment team.  Patients discharged the day of surgery will not be allowed to drive home and must have someone with you for 24 hours.   Special instructions:  DO NOT smoke tobacco or vape for 24 hours before your procedure.     Please read over the following fact sheets that you were given. Anesthesia Post-op Instructions and Care and Recovery After Surgery       Colonoscopy, Adult, Care After This sheet gives you information about how to care for yourself after your procedure. Your health care provider may also give you more specific instructions. If you have problems or questions, contact your health care provider. What can I expect after the procedure? After the procedure, it is common to have:  A small amount of blood in your stool for 24 hours after the procedure.  Some gas.  Mild cramping or bloating of your  abdomen. Follow these instructions at home: Eating and drinking  Drink enough fluid to keep your urine pale yellow.  Follow instructions from your health care provider about eating or drinking restrictions.  Resume your normal diet as instructed by your health care provider. Avoid heavy or fried foods that are hard to digest.   Activity  Rest as told by your health care provider.  Avoid sitting for a long time without moving. Get up to take short walks every 1-2 hours. This is important to improve blood flow and breathing. Ask for help if you feel weak or unsteady.  Return to your normal activities as told by your health care provider. Ask your health care provider what activities are safe for you. Managing cramping and bloating  Try walking around when you have cramps or feel bloated.  Apply heat to your abdomen as told by your health care provider. Use the heat source that your health care provider recommends, such as a moist heat pack or a heating pad. ? Place a towel between your skin and the heat source. ? Leave the heat on for 20-30 minutes. ? Remove the heat if your skin turns bright red. This is especially important if you are unable to feel pain, heat, or cold. You may have a greater  risk of getting burned.   General instructions  If you were given a sedative during the procedure, it can affect you for several hours. Do not drive or operate machinery until your health care provider says that it is safe.  For the first 24 hours after the procedure: ? Do not sign important documents. ? Do not drink alcohol. ? Do your regular daily activities at a slower pace than normal. ? Eat soft foods that are easy to digest.  Take over-the-counter and prescription medicines only as told by your health care provider.  Keep all follow-up visits as told by your health care provider. This is important. Contact a health care provider if:  You have blood in your stool 2-3 days after the  procedure. Get help right away if you have:  More than a small spotting of blood in your stool.  Large blood clots in your stool.  Swelling of your abdomen.  Nausea or vomiting.  A fever.  Increasing pain in your abdomen that is not relieved with medicine. Summary  After the procedure, it is common to have a small amount of blood in your stool. You may also have mild cramping and bloating of your abdomen.  If you were given a sedative during the procedure, it can affect you for several hours. Do not drive or operate machinery until your health care provider says that it is safe.  Get help right away if you have a lot of blood in your stool, nausea or vomiting, a fever, or increased pain in your abdomen. This information is not intended to replace advice given to you by your health care provider. Make sure you discuss any questions you have with your health care provider. Document Revised: 12/14/2018 Document Reviewed: 07/16/2018 Elsevier Patient Education  2021 [place] After This sheet gives you information about how to care for yourself after your procedure. Your health care provider may also give you more specific instructions. If you have problems or questions, contact your health care provider. What can I expect after the procedure? After the procedure, it is common to have:  Tiredness.  Forgetfulness about what happened after the procedure.  Impaired judgment for important decisions.  Nausea or vomiting.  Some difficulty with balance. Follow these instructions at home: For the time period you were told by your health care provider:  Rest as needed.  Do not participate in activities where you could fall or become injured.  Do not drive or use machinery.  Do not drink alcohol.  Do not take sleeping pills or medicines that cause drowsiness.  Do not make important decisions or sign legal documents.  Do not take care of  children on your own.      Eating and drinking  Follow the diet that is recommended by your health care provider.  Drink enough fluid to keep your urine pale yellow.  If you vomit: ? Drink water, juice, or soup when you can drink without vomiting. ? Make sure you have little or no nausea before eating solid foods. General instructions  Have a responsible adult stay with you for the time you are told. It is important to have someone help care for you until you are awake and alert.  Take over-the-counter and prescription medicines only as told by your health care provider.  If you have sleep apnea, surgery and certain medicines can increase your risk for breathing problems. Follow instructions from your health care provider about wearing your  sleep device: ? Anytime you are sleeping, including during daytime naps. ? While taking prescription pain medicines, sleeping medicines, or medicines that make you drowsy.  Avoid smoking.  Keep all follow-up visits as told by your health care provider. This is important. Contact a health care provider if:  You keep feeling nauseous or you keep vomiting.  You feel light-headed.  You are still sleepy or having trouble with balance after 24 hours.  You develop a rash.  You have a fever.  You have redness or swelling around the IV site. Get help right away if:  You have trouble breathing.  You have new-onset confusion at home. Summary  For several hours after your procedure, you may feel tired. You may also be forgetful and have poor judgment.  Have a responsible adult stay with you for the time you are told. It is important to have someone help care for you until you are awake and alert.  Rest as told. Do not drive or operate machinery. Do not drink alcohol or take sleeping pills.  Get help right away if you have trouble breathing, or if you suddenly become confused. This information is not intended to replace advice given to you  by your health care provider. Make sure you discuss any questions you have with your health care provider. Document Revised: 09/05/2019 Document Reviewed: 11/22/2018 Elsevier Patient Education  2021 [employer].

## 2020-05-06 ENCOUNTER — Other Ambulatory Visit (HOSPITAL_COMMUNITY)
Admission: RE | Admit: 2020-05-06 | Discharge: 2020-05-06 | Disposition: A | Discharge status: Home / Self Care | Payer: Medicare Other | Source: Ambulatory Visit | Attending: Gastroenterology | Admitting: Gastroenterology

## 2020-05-06 ENCOUNTER — Encounter (HOSPITAL_COMMUNITY)
Admission: RE | Admit: 2020-05-06 | Discharge: 2020-05-06 | Disposition: A | Discharge status: Home / Self Care | Payer: Medicare Other | Source: Ambulatory Visit | Attending: Gastroenterology | Admitting: Gastroenterology

## 2020-05-06 ENCOUNTER — Encounter (HOSPITAL_COMMUNITY): Payer: Self-pay

## 2020-05-06 ENCOUNTER — Other Ambulatory Visit: Payer: Self-pay

## 2020-05-06 DIAGNOSIS — Z20822 Contact with and (suspected) exposure to covid-19: Secondary | ICD-10-CM | POA: Insufficient documentation

## 2020-05-06 DIAGNOSIS — Z01812 Encounter for preprocedural laboratory examination: Secondary | ICD-10-CM | POA: Diagnosis not present

## 2020-05-06 LAB — SARS CORONAVIRUS 2 (TAT 6-24 HRS): SARS Coronavirus 2: NEGATIVE

## 2020-05-08 ENCOUNTER — Ambulatory Visit (HOSPITAL_COMMUNITY)
Admission: RE | Admit: 2020-05-08 | Discharge: 2020-05-08 | Disposition: A | Discharge status: Home / Self Care | Payer: Medicare Other | Attending: Gastroenterology | Admitting: Gastroenterology

## 2020-05-08 ENCOUNTER — Encounter (HOSPITAL_COMMUNITY)
Admission: RE | Disposition: A | Discharge status: Home / Self Care | Payer: Self-pay | Source: Home / Self Care | Attending: Gastroenterology

## 2020-05-08 ENCOUNTER — Other Ambulatory Visit: Payer: Self-pay

## 2020-05-08 ENCOUNTER — Encounter (HOSPITAL_COMMUNITY): Payer: Self-pay | Admitting: Gastroenterology

## 2020-05-08 ENCOUNTER — Ambulatory Visit (HOSPITAL_COMMUNITY): Payer: Medicare Other | Admitting: Anesthesiology

## 2020-05-08 DIAGNOSIS — Z6831 Body mass index (BMI) 31.0-31.9, adult: Secondary | ICD-10-CM | POA: Diagnosis not present

## 2020-05-08 DIAGNOSIS — I2510 Atherosclerotic heart disease of native coronary artery without angina pectoris: Secondary | ICD-10-CM | POA: Insufficient documentation

## 2020-05-08 DIAGNOSIS — Z9119 Patient's noncompliance with other medical treatment and regimen: Secondary | ICD-10-CM

## 2020-05-08 DIAGNOSIS — Z79890 Hormone replacement therapy (postmenopausal): Secondary | ICD-10-CM | POA: Insufficient documentation

## 2020-05-08 DIAGNOSIS — E669 Obesity, unspecified: Secondary | ICD-10-CM | POA: Insufficient documentation

## 2020-05-08 DIAGNOSIS — Z79899 Other long term (current) drug therapy: Secondary | ICD-10-CM | POA: Insufficient documentation

## 2020-05-08 DIAGNOSIS — Z7982 Long term (current) use of aspirin: Secondary | ICD-10-CM | POA: Diagnosis not present

## 2020-05-08 DIAGNOSIS — Z87891 Personal history of nicotine dependence: Secondary | ICD-10-CM | POA: Diagnosis not present

## 2020-05-08 DIAGNOSIS — J449 Chronic obstructive pulmonary disease, unspecified: Secondary | ICD-10-CM | POA: Diagnosis not present

## 2020-05-08 DIAGNOSIS — Z955 Presence of coronary angioplasty implant and graft: Secondary | ICD-10-CM | POA: Insufficient documentation

## 2020-05-08 DIAGNOSIS — K625 Hemorrhage of anus and rectum: Secondary | ICD-10-CM

## 2020-05-08 HISTORY — PX: COLONOSCOPY WITH PROPOFOL: SHX5780

## 2020-05-08 SURGERY — COLONOSCOPY WITH PROPOFOL
Anesthesia: General

## 2020-05-08 MED ORDER — LACTATED RINGERS IV SOLN: INTRAVENOUS | Status: DC

## 2020-05-08 MED ORDER — STERILE WATER FOR IRRIGATION IR SOLN
Status: DC | PRN
  Administered 2020-05-08: 100 mL

## 2020-05-08 MED ORDER — PROPOFOL 500 MG/50ML IV EMUL
INTRAVENOUS | Status: DC | PRN
  Administered 2020-05-08: 150 ug/kg/min via INTRAVENOUS

## 2020-05-08 MED ORDER — PROPOFOL 10 MG/ML IV BOLUS
INTRAVENOUS | Status: AC
  Filled 2020-05-08: qty 60

## 2020-05-08 MED ORDER — PROPOFOL 10 MG/ML IV BOLUS
INTRAVENOUS | Status: DC | PRN
  Administered 2020-05-08: 50 mg via INTRAVENOUS

## 2020-05-08 NOTE — Anesthesia Postprocedure Evaluation (Signed)
Anesthesia Post Note  Patient: [name]  Procedure(s) Performed: COLONOSCOPY WITH PROPOFOL (N/A )  Patient location during evaluation: Short Stay Anesthesia Type: General Level of consciousness: awake and alert and oriented Pain management: pain level controlled Vital Signs Assessment: post-procedure vital signs reviewed and stable Respiratory status: spontaneous breathing Cardiovascular status: blood pressure returned to baseline and stable Postop Assessment: no apparent nausea or vomiting Anesthetic complications: no   No complications documented.   Last Vitals:  Vitals:   05/08/20 0725 05/08/20 0845  BP: 137/89 110/65  Pulse: 83 65  Resp: 17 17  Temp: 36.9 C 36.9 C  SpO2:  96%    Last Pain:  Vitals:   05/08/20 0845  TempSrc:   PainSc: 0-No pain                 Heydi Swango

## 2020-05-08 NOTE — Anesthesia Preprocedure Evaluation (Signed)
Anesthesia Evaluation  Patient identified by MRN, date of birth, ID band Patient awake    Reviewed: Allergy & Precautions, NPO status , Patient's Chart, lab work & pertinent test results  Airway Mallampati: II  TM Distance: >3 FB Neck ROM: Full    Dental  (+) Dental Advisory Given, Upper Dentures, Lower Dentures   Pulmonary shortness of breath and with exertion, sleep apnea , COPD,  COPD inhaler, former smoker,    Pulmonary exam normal breath sounds clear to auscultation       Cardiovascular Exercise Tolerance: Good hypertension, Pt. on medications + angina with exertion + CAD and + Cardiac Stents  Normal cardiovascular exam Rhythm:Regular Rate:Normal     Neuro/Psych  Headaches, negative psych ROS   GI/Hepatic Neg liver ROS, GERD  Medicated,  Endo/Other  Hypothyroidism   Renal/GU negative Renal ROS     Musculoskeletal  (+) Arthritis ,   Abdominal   Peds  Hematology   Anesthesia Other Findings   Reproductive/Obstetrics                             Anesthesia Physical Anesthesia Plan  ASA: III  Anesthesia Plan: General   Post-op Pain Management:    Induction: Intravenous  PONV Risk Score and Plan: Propofol infusion  Airway Management Planned: Nasal Cannula and Natural Airway  Additional Equipment:   Intra-op Plan:   Post-operative Plan:   Informed Consent: I have reviewed the patients History and Physical, chart, labs and discussed the procedure including the risks, benefits and alternatives for the proposed anesthesia with the patient or authorized representative who has indicated his/her understanding and acceptance.     Dental advisory given  Plan Discussed with: CRNA and Surgeon  Anesthesia Plan Comments:         Anesthesia Quick Evaluation

## 2020-05-08 NOTE — Transfer of Care (Signed)
Immediate Anesthesia Transfer of Care Note  Patient: [name]  Procedure(s) Performed: COLONOSCOPY WITH PROPOFOL (N/A )  Patient Location: Short Stay  Anesthesia Type:General  Level of Consciousness: awake  Airway & Oxygen Therapy: Patient Spontanous Breathing  Post-op Assessment: Report given to RN  Post vital signs: Reviewed  Last Vitals:  Vitals Value Taken Time  BP 110/65 05/08/20 0845  Temp 36.9 C 05/08/20 0845  Pulse 65 05/08/20 0845  Resp 17 05/08/20 0845  SpO2 96 % 05/08/20 0845    Last Pain:  Vitals:   05/08/20 0845  TempSrc:   PainSc: 0-No pain      Patients Stated Pain Goal: 7 (85/02/77 4128)  Complications: No complications documented.

## 2020-05-08 NOTE — Op Note (Signed)
[facility] Patient Name: [name] Procedure Date: 05/08/2020 7:35 AM MRN: [id number] Date of Birth: 1945-03-13 Attending MD: Maylon Peppers ,  CSN: [id number] Age: 75 Admit Type: Outpatient Procedure:                Flexible Sigmoidoscopy Indications:              Rectal bleeding Providers:                Maylon Peppers, Lambert Mody, Randa Spike, Technician Referring MD:              Medicines:                Monitored Anesthesia Care Complications:            No immediate complications. Estimated Blood Loss:     Estimated blood loss: none. Procedure:                Pre-Anesthesia Assessment:                           - Prior to the procedure, a History and Physical                            was performed, and patient medications, allergies                            and sensitivities were reviewed. The patient's                            tolerance of previous anesthesia was reviewed.                           - The risks and benefits of the procedure and the                            sedation options and risks were discussed with the                            patient. All questions were answered and informed                            consent was obtained.                           - ASA Grade Assessment: III - A patient with severe                            systemic disease.                           After obtaining informed consent, the scope was                            passed under direct vision. The patient tolerated  the procedure well. The quality of the bowel                            preparation was poor. The [id number]-[id number] ([id number])                            scope was introduced through the anus and advanced                            to the the sigmoid colon. After obtaining informed                            consent, the scope was passed under direct                            vision.The  flexible sigmoidoscopy was technically                            difficult and complex due to poor bowel prep. Scope In: 8:35:29 AM Scope Out: 8:37:28 AM Total Procedure Duration: 0 hours 1 minute 59 seconds  Findings:      The perianal and digital rectal examinations were normal.      A large amount of stool was found in the recto-sigmoid colon and in the       sigmoid colon, interfering with visualization. Impression:               - Preparation of the colon was poor.                           - Stool in the recto-sigmoid colon and in the                            sigmoid colon.                           - No specimens collected. Moderate Sedation:      Per Anesthesia Care Recommendation:           - Discharge patient to home (ambulatory).                           - Resume previous diet.                           - Repeat colonoscopy at the next available                            appointment because the bowel preparation was poor,                            will need an extended two day prep. Procedure Code(s):        --- Professional ---                           [phone], Sigmoidoscopy, flexible; diagnostic,  including collection of specimen(s) by brushing or                            washing, when performed (separate procedure) Diagnosis Code(s):        --- Professional ---                           K62.5, Hemorrhage of anus and rectum CPT copyright 2019 American [employer] Association. All rights reserved. The codes documented in this report are preliminary and upon coder review may  be revised to meet current compliance requirements. Maylon Peppers, MD Maylon Peppers,  05/08/2020 8:42:38 AM This report has been signed electronically. Number of Addenda: 0

## 2020-05-08 NOTE — Interval H&P Note (Signed)
History and Physical Interval Note:  05/08/2020 7:34 AM  [name] is a 75 y.o. male with past medical history of coronary artery disease status post stent placement, hypothyroidism, OSA, hyperlipidemia, who presents for evaluation of rectal bleeding.  Patient reports that since the last time he was seen in clinic, he only had 1 episode of rectal bleeding which was scant in amount yesterday.  He denies having any nausea, vomiting, fever, chills, abdominal pain or distention.  Last Colonoscopy: 2015  Prep excellent. Three small polyps ablated via cold biopsy from cecum and one from the ascending colon and these polyps were submitted together. (all TA) Few scattered diverticula at sigmoid colon. Normal rectal mucosa. Mall hemorrhoids with focal erythema noted below the dentate line.  BP 137/89   Pulse 83   Temp 98.4 F (36.9 C) (Oral)   Resp 17  GENERAL: The patient is AO x3, in no acute distress. HEENT: Head is normocephalic and atraumatic. EOMI are intact. Mouth is well hydrated and without lesions. NECK: Supple. No masses LUNGS: Clear to auscultation. No presence of rhonchi/wheezing/rales. Adequate chest expansion HEART: RRR, normal s1 and s2. ABDOMEN: Soft, nontender, no guarding, no peritoneal signs, and nondistended. BS +. No masses. EXTREMITIES: Without any cyanosis, clubbing, rash, lesions or edema. NEUROLOGIC: AOx3, no focal motor deficit. SKIN: no jaundice, no rashes   [name]  has presented today for surgery, with the diagnosis of Rectal Bleeding History of Colon Polys.  The various methods of treatment have been discussed with the patient and family. After consideration of risks, benefits and other options for treatment, the patient has consented to  Procedure(s) with comments: COLONOSCOPY WITH PROPOFOL (N/A) - AM as a surgical intervention.  The patient's history has been reviewed, patient examined, no change in status, stable for surgery.  I have reviewed  the patient's chart and labs.  Questions were answered to the patient's satisfaction.     [name]

## 2020-05-08 NOTE — Discharge Instructions (Signed)
You are being discharged to home.  Resume your previous diet.  Your physician has recommended a repeat flexible sigmoidoscopy at the next available appointment because the bowel preparation was poor, will need an extended two day prep.   Monitored Anesthesia Care, Care After This sheet gives you information about how to care for yourself after your procedure. Your health care provider may also give you more specific instructions. If you have problems or questions, contact your health care provider. What can I expect after the procedure? After the procedure, it is common to have:  Tiredness.  Forgetfulness about what happened after the procedure.  Impaired judgment for important decisions.  Nausea or vomiting.  Some difficulty with balance. Follow these instructions at home: For the time period you were told by your health care provider:  Rest as needed.  Do not participate in activities where you could fall or become injured.  Do not drive or use machinery.  Do not drink alcohol.  Do not take sleeping pills or medicines that cause drowsiness.  Do not make important decisions or sign legal documents.  Do not take care of children on your own.      Eating and drinking  Follow the diet that is recommended by your health care provider.  Drink enough fluid to keep your urine pale yellow.  If you vomit: ? Drink water, juice, or soup when you can drink without vomiting. ? Make sure you have little or no nausea before eating solid foods. General instructions  Have a responsible adult stay with you for the time you are told. It is important to have someone help care for you until you are awake and alert.  Take over-the-counter and prescription medicines only as told by your health care provider.  If you have sleep apnea, surgery and certain medicines can increase your risk for breathing problems. Follow instructions from your health care provider about wearing your sleep  device: ? Anytime you are sleeping, including during daytime naps. ? While taking prescription pain medicines, sleeping medicines, or medicines that make you drowsy.  Avoid smoking.  Keep all follow-up visits as told by your health care provider. This is important. Contact a health care provider if:  You keep feeling nauseous or you keep vomiting.  You feel light-headed.  You are still sleepy or having trouble with balance after 24 hours.  You develop a rash.  You have a fever.  You have redness or swelling around the IV site. Get help right away if:  You have trouble breathing.  You have new-onset confusion at home. Summary  For several hours after your procedure, you may feel tired. You may also be forgetful and have poor judgment.  Have a responsible adult stay with you for the time you are told. It is important to have someone help care for you until you are awake and alert.  Rest as told. Do not drive or operate machinery. Do not drink alcohol or take sleeping pills.  Get help right away if you have trouble breathing, or if you suddenly become confused. This information is not intended to replace advice given to you by your health care provider. Make sure you discuss any questions you have with your health care provider. Document Revised: 09/05/2019 Document Reviewed: 11/22/2018 Elsevier Patient Education  2021 [employer].

## 2020-05-11 ENCOUNTER — Other Ambulatory Visit (INDEPENDENT_AMBULATORY_CARE_PROVIDER_SITE_OTHER): Payer: Self-pay

## 2020-05-11 ENCOUNTER — Telehealth (INDEPENDENT_AMBULATORY_CARE_PROVIDER_SITE_OTHER): Payer: Self-pay

## 2020-05-11 DIAGNOSIS — D126 Benign neoplasm of colon, unspecified: Secondary | ICD-10-CM

## 2020-05-11 DIAGNOSIS — K625 Hemorrhage of anus and rectum: Secondary | ICD-10-CM

## 2020-05-11 MED ORDER — PEG 3350-KCL-NA BICARB-NACL 420 G PO SOLR
4000.0000 mL | ORAL | 0 refills | Status: DC
Start: 2020-05-11 — End: 2020-05-11

## 2020-05-11 MED ORDER — PEG 3350-KCL-NA BICARB-NACL 420 G PO SOLR: 8000.0000 mL | ORAL | 0 refills | Status: AC

## 2020-05-11 NOTE — Telephone Encounter (Signed)
[name], CMA  

## 2020-05-11 NOTE — Telephone Encounter (Signed)
LeighAnn Julus Kelley, CMA  

## 2020-05-12 ENCOUNTER — Encounter (INDEPENDENT_AMBULATORY_CARE_PROVIDER_SITE_OTHER): Payer: Self-pay

## 2020-05-14 ENCOUNTER — Encounter (HOSPITAL_COMMUNITY): Payer: Self-pay | Admitting: Gastroenterology

## 2020-05-26 ENCOUNTER — Encounter (INDEPENDENT_AMBULATORY_CARE_PROVIDER_SITE_OTHER): Payer: Self-pay

## 2020-05-27 ENCOUNTER — Encounter (HOSPITAL_COMMUNITY): Admission: RE | Admit: 2020-05-27 | Payer: Medicare Other | Source: Ambulatory Visit

## 2020-05-27 ENCOUNTER — Other Ambulatory Visit (HOSPITAL_COMMUNITY): Payer: Medicare Other

## 2020-05-27 ENCOUNTER — Encounter (HOSPITAL_COMMUNITY): Payer: Medicare Other

## 2020-05-29 ENCOUNTER — Other Ambulatory Visit: Payer: Self-pay

## 2020-05-29 ENCOUNTER — Ambulatory Visit (INDEPENDENT_AMBULATORY_CARE_PROVIDER_SITE_OTHER): Payer: Medicare Other | Admitting: Family Medicine

## 2020-05-29 ENCOUNTER — Encounter: Payer: Self-pay | Admitting: Family Medicine

## 2020-05-29 VITALS — BP 139/81 | HR 58 | Temp 98.1°F | Resp 20 | Ht 72.0 in | Wt 225.0 lb

## 2020-05-29 DIAGNOSIS — I10 Essential (primary) hypertension: Secondary | ICD-10-CM | POA: Diagnosis not present

## 2020-05-29 NOTE — Progress Notes (Signed)
BP 139/81   Pulse (!) 58   Temp 98.1 F (36.7 C) (Temporal)   Resp 20   Ht 6' (1.829 m)   Wt 225 lb (102.1 kg)   SpO2 99%   BMI 30.52 kg/m    Subjective:   Patient ID: [name], male    DOB: February 22, 1945, 75 y.o.   MRN: [id number]  HPI: [name] is a 75 y.o. male presenting on 05/29/2020 for Hypertension and Dizziness   HPI Hypertension Patient is currently on amlodipine and lisinopril, we stopped the hydrochlorothiazide, and their blood pressure today is 154/81, on recheck was 139/81 which is perfect, he says he is feeling a lot better and not have any more dizziness.  We will keep a close eye on the blood pressures for now but do not want to add anything back because of dizziness.. Patient denies any lightheadedness or dizziness. Patient denies headaches, blurred vision, chest pains, shortness of breath, or weakness. Denies any side effects from medication and is content with current medication.   Relevant past medical, surgical, family and social history reviewed and updated as indicated. Interim medical history since our last visit reviewed. Allergies and medications reviewed and updated.  Review of Systems  Constitutional: Negative for chills and fever.  Eyes: Negative for visual disturbance.  Respiratory: Negative for shortness of breath and wheezing.   Cardiovascular: Negative for chest pain and leg swelling.  Musculoskeletal: Negative for back pain and gait problem.  Skin: Negative for rash.  Neurological: Negative for dizziness and light-headedness.  All other systems reviewed and are negative.   Per HPI unless specifically indicated above   Allergies as of 05/29/2020   No Known Allergies     Medication List       Accurate as of May 29, 2020  9:23 AM. If you have any questions, ask your nurse or doctor.        albuterol 108 (90 Base) MCG/ACT inhaler Commonly known as: ProAir HFA Inhale 2 puffs into the lungs every 6 (six) hours as needed  for wheezing or shortness of breath.   amLODipine 10 MG tablet Commonly known as: NORVASC Take 1 tablet (10 mg total) by mouth daily.   aspirin EC 81 MG tablet Take 1 tablet (81 mg total) by mouth daily.   levothyroxine 150 MCG tablet Commonly known as: SYNTHROID Take 1 tablet (150 mcg total) by mouth daily.   lisinopril 20 MG tablet Commonly known as: ZESTRIL Take 1 tablet (20 mg total) by mouth daily.   multivitamin with minerals Tabs tablet Take 1 tablet by mouth daily.   oxyCODONE-acetaminophen 7.5-325 MG tablet Commonly known as: Percocet Take 1 tablet by mouth every 6 (six) hours as needed for severe pain.   sertraline 50 MG tablet Commonly known as: Zoloft Take 1 tablet (50 mg total) by mouth daily.   sildenafil 20 MG tablet Commonly known as: REVATIO Take 1-3 tablets (20-60 mg total) by mouth as needed.   simvastatin 40 MG tablet Commonly known as: ZOCOR Take 1 tablet (40 mg total) by mouth at bedtime.        Objective:   BP 139/81   Pulse (!) 58   Temp 98.1 F (36.7 C) (Temporal)   Resp 20   Ht 6' (1.829 m)   Wt 225 lb (102.1 kg)   SpO2 99%   BMI 30.52 kg/m   Wt Readings from Last 3 Encounters:  05/29/20 225 lb (102.1 kg)  04/29/20 227 lb (103 kg)  04/13/20 230 lb (104.3 kg)    Physical Exam Vitals and nursing note reviewed.  Constitutional:      General: He is not in acute distress.    Appearance: He is well-developed. He is not diaphoretic.  Eyes:     General: No scleral icterus.    Conjunctiva/sclera: Conjunctivae normal.  Neck:     Thyroid: No thyromegaly.  Cardiovascular:     Rate and Rhythm: Normal rate and regular rhythm.     Heart sounds: Normal heart sounds. No murmur heard.   Pulmonary:     Effort: Pulmonary effort is normal. No respiratory distress.     Breath sounds: Normal breath sounds. No wheezing.  Musculoskeletal:     Cervical back: Neck supple.  Lymphadenopathy:     Cervical: No cervical adenopathy.  Skin:     General: Skin is warm and dry.     Findings: No rash.  Neurological:     Mental Status: He is alert and oriented to person, place, and time.     Coordination: Coordination normal.  Psychiatric:        Behavior: Behavior normal.       Assessment & Plan:   Problem List Items Addressed This Visit      Cardiovascular and Mediastinum   Essential hypertension - Primary    Dizziness is improved, his blood pressure is up just slightly but will allow that for now and keep a close eye on it over the next couple weeks. Recheck 139/81, will continue to monitor blood pressure in the future. Follow up plan: Return if symptoms worsen or fail to improve.  Counseling provided for all of the vaccine components No orders of the defined types were placed in this encounter.   Caryl Pina, MD [facility] 05/29/2020, 9:23 AM

## 2020-06-03 ENCOUNTER — Other Ambulatory Visit (HOSPITAL_COMMUNITY): Payer: Medicare Other

## 2020-06-03 ENCOUNTER — Inpatient Hospital Stay (HOSPITAL_COMMUNITY): Admission: RE | Admit: 2020-06-03 | Payer: Medicare Other | Source: Ambulatory Visit

## 2020-06-15 NOTE — Patient Instructions (Signed)
BANNON GIAMMARCO  06/15/2020     @PREFPERIOPPHARMACY @   Your procedure is scheduled on 06/19/2020.   Report to [name] at  [phone]  A.M.   Call this number if you have problems the morning of surgery:  [phone]   Remember:  Follow the diet and prep instructions given to you by the office.    Take these medicines the morning of surgery with A SIP OF WATER  amlodipine, levothyroxine, oxycodone (if needed), zoloft.   Use your inhaler before you come.     Please brush your teeth.  Do not wear jewelry, make-up or nail polish.  Do not wear lotions, powders, or perfumes, or deodorant.  Do not shave 48 hours prior to surgery.  Men may shave face and neck.  Do not bring valuables to the hospital.  [facility] is not responsible for any belongings or valuables.  Contacts, dentures or bridgework may not be worn into surgery.  Leave your suitcase in the car.  After surgery it may be brought to your room.  For patients admitted to the hospital, discharge time will be determined by your treatment team.  Patients discharged the day of surgery will not be allowed to drive home and must have someone with them for 24 hours.    Special instructions:   DO NOT smoke tobacco or vape for 24 hours before your procedure.  Please read over the following fact sheets that you were given. Anesthesia Post-op Instructions and Care and Recovery After Surgery      Colonoscopy, Adult, Care After This sheet gives you information about how to care for yourself after your procedure. Your health care provider may also give you more specific instructions. If you have problems or questions, contact your health careprovider. What can I expect after the procedure? After the procedure, it is common to have: A small amount of blood in your stool for 24 hours after the procedure. Some gas. Mild cramping or bloating of your abdomen. Follow these instructions at home: Eating and drinking  Drink  enough fluid to keep your urine pale yellow. Follow instructions from your health care provider about eating or drinking restrictions. Resume your normal diet as instructed by your health care provider. Avoid heavy or fried foods that are hard to digest.  Activity Rest as told by your health care provider. Avoid sitting for a long time without moving. Get up to take short walks every 1-2 hours. This is important to improve blood flow and breathing. Ask for help if you feel weak or unsteady. Return to your normal activities as told by your health care provider. Ask your health care provider what activities are safe for you. Managing cramping and bloating  Try walking around when you have cramps or feel bloated. Apply heat to your abdomen as told by your health care provider. Use the heat source that your health care provider recommends, such as a moist heat pack or a heating pad. Place a towel between your skin and the heat source. Leave the heat on for 20-30 minutes. Remove the heat if your skin turns bright red. This is especially important if you are unable to feel pain, heat, or cold. You may have a greater risk of getting burned.  General instructions If you were given a sedative during the procedure, it can affect you for several hours. Do not drive or operate machinery until your health care provider says that it is safe. For the  first 24 hours after the procedure: Do not sign important documents. Do not drink alcohol. Do your regular daily activities at a slower pace than normal. Eat soft foods that are easy to digest. Take over-the-counter and prescription medicines only as told by your health care provider. Keep all follow-up visits as told by your health care provider. This is important. Contact a health care provider if: You have blood in your stool 2-3 days after the procedure. Get help right away if you have: More than a small spotting of blood in your stool. Large blood  clots in your stool. Swelling of your abdomen. Nausea or vomiting. A fever. Increasing pain in your abdomen that is not relieved with medicine. Summary After the procedure, it is common to have a small amount of blood in your stool. You may also have mild cramping and bloating of your abdomen. If you were given a sedative during the procedure, it can affect you for several hours. Do not drive or operate machinery until your health care provider says that it is safe. Get help right away if you have a lot of blood in your stool, nausea or vomiting, a fever, or increased pain in your abdomen. This information is not intended to replace advice given to you by your health care provider. Make sure you discuss any questions you have with your healthcare provider. Document Revised: 12/14/2018 Document Reviewed: 07/16/2018 Elsevier Patient Education  [place] After This sheet gives you information about how to care for yourself after your procedure. Your health care provider may also give you more specific instructions. If you have problems or questions, contact your health careprovider. What can I expect after the procedure? After the procedure, it is common to have: Tiredness. Forgetfulness about what happened after the procedure. Impaired judgment for important decisions. Nausea or vomiting. Some difficulty with balance. Follow these instructions at home: For the time period you were told by your health care provider:     Rest as needed. Do not participate in activities where you could fall or become injured. Do not drive or use machinery. Do not drink alcohol. Do not take sleeping pills or medicines that cause drowsiness. Do not make important decisions or sign legal documents. Do not take care of children on your own. Eating and drinking Follow the diet that is recommended by your health care provider. Drink enough fluid to keep your urine  pale yellow. If you vomit: Drink water, juice, or soup when you can drink without vomiting. Make sure you have little or no nausea before eating solid foods. General instructions Have a responsible adult stay with you for the time you are told. It is important to have someone help care for you until you are awake and alert. Take over-the-counter and prescription medicines only as told by your health care provider. If you have sleep apnea, surgery and certain medicines can increase your risk for breathing problems. Follow instructions from your health care provider about wearing your sleep device: Anytime you are sleeping, including during daytime naps. While taking prescription pain medicines, sleeping medicines, or medicines that make you drowsy. Avoid smoking. Keep all follow-up visits as told by your health care provider. This is important. Contact a health care provider if: You keep feeling nauseous or you keep vomiting. You feel light-headed. You are still sleepy or having trouble with balance after 24 hours. You develop a rash. You have a fever. You have redness or swelling around the  IV site. Get help right away if: You have trouble breathing. You have new-onset confusion at home. Summary For several hours after your procedure, you may feel tired. You may also be forgetful and have poor judgment. Have a responsible adult stay with you for the time you are told. It is important to have someone help care for you until you are awake and alert. Rest as told. Do not drive or operate machinery. Do not drink alcohol or take sleeping pills. Get help right away if you have trouble breathing, or if you suddenly become confused. This information is not intended to replace advice given to you by your health care provider. Make sure you discuss any questions you have with your healthcare provider. Document Revised: 09/05/2019 Document Reviewed: 11/22/2018 Elsevier Patient Education  2022  [employer].

## 2020-06-17 ENCOUNTER — Other Ambulatory Visit (HOSPITAL_COMMUNITY): Payer: Medicare Other

## 2020-06-17 ENCOUNTER — Encounter (HOSPITAL_COMMUNITY)
Admission: RE | Admit: 2020-06-17 | Discharge: 2020-06-17 | Disposition: A | Discharge status: Home / Self Care | Payer: Medicare Other | Source: Ambulatory Visit | Attending: Gastroenterology | Admitting: Gastroenterology

## 2020-06-17 ENCOUNTER — Other Ambulatory Visit: Payer: Self-pay

## 2020-06-17 DIAGNOSIS — D126 Benign neoplasm of colon, unspecified: Secondary | ICD-10-CM

## 2020-06-17 DIAGNOSIS — K625 Hemorrhage of anus and rectum: Secondary | ICD-10-CM

## 2020-06-19 ENCOUNTER — Encounter (HOSPITAL_COMMUNITY)
Admission: RE | Disposition: A | Discharge status: Home / Self Care | Payer: Self-pay | Source: Home / Self Care | Attending: Gastroenterology

## 2020-06-19 ENCOUNTER — Telehealth: Payer: Self-pay | Admitting: Gastroenterology

## 2020-06-19 ENCOUNTER — Ambulatory Visit (HOSPITAL_COMMUNITY): Payer: Medicare Other | Admitting: Certified Registered"

## 2020-06-19 ENCOUNTER — Ambulatory Visit (HOSPITAL_COMMUNITY)
Admission: RE | Admit: 2020-06-19 | Discharge: 2020-06-19 | Disposition: A | Discharge status: Home / Self Care | Payer: Medicare Other | Attending: Gastroenterology | Admitting: Gastroenterology

## 2020-06-19 DIAGNOSIS — E039 Hypothyroidism, unspecified: Secondary | ICD-10-CM | POA: Insufficient documentation

## 2020-06-19 DIAGNOSIS — D126 Benign neoplasm of colon, unspecified: Secondary | ICD-10-CM

## 2020-06-19 DIAGNOSIS — K625 Hemorrhage of anus and rectum: Secondary | ICD-10-CM | POA: Diagnosis not present

## 2020-06-19 DIAGNOSIS — K644 Residual hemorrhoidal skin tags: Secondary | ICD-10-CM | POA: Insufficient documentation

## 2020-06-19 DIAGNOSIS — G4733 Obstructive sleep apnea (adult) (pediatric): Secondary | ICD-10-CM | POA: Insufficient documentation

## 2020-06-19 DIAGNOSIS — Z87891 Personal history of nicotine dependence: Secondary | ICD-10-CM | POA: Insufficient documentation

## 2020-06-19 DIAGNOSIS — I2510 Atherosclerotic heart disease of native coronary artery without angina pectoris: Secondary | ICD-10-CM | POA: Diagnosis not present

## 2020-06-19 DIAGNOSIS — Z79899 Other long term (current) drug therapy: Secondary | ICD-10-CM | POA: Insufficient documentation

## 2020-06-19 DIAGNOSIS — Z955 Presence of coronary angioplasty implant and graft: Secondary | ICD-10-CM | POA: Insufficient documentation

## 2020-06-19 DIAGNOSIS — K648 Other hemorrhoids: Secondary | ICD-10-CM | POA: Insufficient documentation

## 2020-06-19 DIAGNOSIS — Z7982 Long term (current) use of aspirin: Secondary | ICD-10-CM | POA: Diagnosis not present

## 2020-06-19 DIAGNOSIS — E785 Hyperlipidemia, unspecified: Secondary | ICD-10-CM | POA: Insufficient documentation

## 2020-06-19 HISTORY — PX: COLONOSCOPY WITH PROPOFOL: SHX5780

## 2020-06-19 SURGERY — COLONOSCOPY WITH PROPOFOL
Anesthesia: General

## 2020-06-19 MED ORDER — PROPOFOL 10 MG/ML IV BOLUS
INTRAVENOUS | Status: DC | PRN
  Administered 2020-06-19: 100 mg via INTRAVENOUS
  Administered 2020-06-19: 125 ug/kg/min via INTRAVENOUS

## 2020-06-19 MED ORDER — SODIUM CHLORIDE 0.9 % IV SOLN
INTRAVENOUS | Status: DC | PRN
  Administered 2020-06-19 (×2): 80 ug via INTRAVENOUS
  Administered 2020-06-19: 120 ug via INTRAVENOUS
  Administered 2020-06-19: 80 ug via INTRAVENOUS

## 2020-06-19 MED ORDER — EPHEDRINE SULFATE 50 MG/ML IJ SOLN
INTRAMUSCULAR | Status: DC | PRN
  Administered 2020-06-19: 10 mg via INTRAVENOUS

## 2020-06-19 MED ORDER — LACTATED RINGERS IV SOLN: INTRAVENOUS | Status: DC

## 2020-06-19 MED ORDER — STERILE WATER FOR IRRIGATION IR SOLN
Status: DC | PRN
  Administered 2020-06-19: 200 mL

## 2020-06-19 MED ORDER — GLYCOPYRROLATE 0.2 MG/ML IJ SOLN
INTRAMUSCULAR | Status: DC | PRN
  Administered 2020-06-19: .2 mg via INTRAVENOUS

## 2020-06-19 MED ORDER — NALOXEGOL OXALATE 12.5 MG PO TABS
12.5000 mg | ORAL_TABLET | Freq: Every day | ORAL | 3 refills | Status: DC
Start: 2020-06-19 — End: 2020-07-20

## 2020-06-19 NOTE — Telephone Encounter (Signed)
Endo called and said that this patient was not cleaned out and will need repeat tcs. I have him scheduled for his banding with Vicente Males for next week

## 2020-06-19 NOTE — Anesthesia Postprocedure Evaluation (Signed)
Anesthesia Post Note  Patient: [name]  Procedure(s) Performed: COLONOSCOPY WITH PROPOFOL  Patient location during evaluation: Phase II Anesthesia Type: General Level of consciousness: awake Pain management: pain level controlled Vital Signs Assessment: post-procedure vital signs reviewed and stable Respiratory status: spontaneous breathing and respiratory function stable Cardiovascular status: blood pressure returned to baseline and stable Postop Assessment: no headache and no apparent nausea or vomiting Anesthetic complications: no Comments: Late entry   No notable events documented.   Last Vitals:  Vitals:   06/19/20 0758 06/19/20 0844  BP: 135/77 118/76  Pulse: 61 (!) 104  Resp: 18 18  Temp: (!) 31.1 C 36.8 C  SpO2: 98% 96%    Last Pain:  Vitals:   06/19/20 0844  TempSrc: Oral  PainSc: 0-No pain                 Louann Sjogren

## 2020-06-19 NOTE — Transfer of Care (Signed)
Immediate Anesthesia Transfer of Care Note  Patient: [name]  Procedure(s) Performed: COLONOSCOPY WITH PROPOFOL  Patient Location: Short Stay  Anesthesia Type:General  Level of Consciousness: awake, alert , oriented and patient cooperative  Airway & Oxygen Therapy: Patient Spontanous Breathing  Post-op Assessment: Report given to RN, Post -op Vital signs reviewed and stable and Patient moving all extremities  Post vital signs: Reviewed and stable  Last Vitals:  Vitals Value Taken Time  BP    Temp    Pulse    Resp    SpO2      Last Pain:  Vitals:   06/19/20 0812  TempSrc:   PainSc: 0-No pain      Patients Stated Pain Goal: 5 (48/59/27 6394)  Complications: No notable events documented.

## 2020-06-19 NOTE — H&P (View-Only) (Signed)
[name] is an 75 y.o. male.   Chief Complaint: rectal bleeding HPI: [name] is a 75 y.o. male with past medical history of coronary artery disease status post stent placement, hypothyroidism, OSA, hyperlipidemia, who presents for evaluation of rectal bleeding.  Patient has presented persistent symptoms of rectal bleeding since late February 2022.  He reports that he has had fresh blood every time he has a bowel movement.  This is not mixed with his stool but it comes "as water".  Denies any melena, abdominal pain, nausea, vomiting, fever, chills, use of NSAIDs or any blood thinners.  Last Colonoscopy: 2015  Prep excellent. Three small polyps ablated via cold biopsy from cecum and one from the ascending colon and these polyps were submitted together. (all TA) Few scattered diverticula at sigmoid colon. Normal rectal mucosa. Mall hemorrhoids with focal erythema noted below the dentate line.  Past Medical History:  Diagnosis Date   Anginal pain ([place])    Arthritis    OSTEO BACK KNEES HIPS & HANDS   Arthropathy, unspecified, site unspecified    Chronic airway obstruction, not elsewhere classified    Coronary atherosclerosis of unspecified type of vessel, native or graft    a. [place] 5/16:  mLAD 20%, CFX stent ok, OM3 stent ok, mRCA 30%, normal LVF   Headache(784.0)    Hx of echocardiogram    a. Echo 5/16:  mild LVH, EF 60%, mildly dilated Ao root (40 mm)   Hypothyroidism    Other and unspecified hyperlipidemia    Thyroid disease    hypothyroid   Unspecified essential hypertension    Unspecified sleep apnea    Dr. Jacelyn Grip at [facility] told pt he had sleep apnea, but never sent him for a sleep study so he doesn't wear a CPAP    Past Surgical History:  Procedure Laterality Date   ANKLE SURGERY     BALLOON DILATION N/A 05/02/2013   Procedure: BALLOON DILATION;  Surgeon: Rogene Houston, MD;  Location: [facility];  Service: Endoscopy;  Laterality: N/A;   CARDIAC  CATHETERIZATION  01/15/2013   CARDIAC CATHETERIZATION N/A 05/16/2014   Procedure: Left Heart Cath and Coronary Angiography;  Surgeon: Peter M Martinique, MD;  Location: [place] [facility];  Service: Cardiovascular;  Laterality: N/A;   COLONOSCOPY N/A 05/02/2013   Procedure: COLONOSCOPY;  Surgeon: Rogene Houston, MD;  Location: [facility];  Service: Endoscopy;  Laterality: N/A;  200-moved to 1200 Ann notified pt   COLONOSCOPY WITH PROPOFOL N/A 05/08/2020   Procedure: COLONOSCOPY WITH PROPOFOL;  Surgeon: Harvel Quale, MD;  Location: [facility];  Service: Gastroenterology;  Laterality: N/A;  AM   CORONARY STENT PLACEMENT     2003, 2004   ESOPHAGOGASTRODUODENOSCOPY N/A 05/02/2013   Procedure: ESOPHAGOGASTRODUODENOSCOPY (EGD);  Surgeon: Rogene Houston, MD;  [facility];  Service: Endoscopy;  Laterality: N/A;   HEMORRHOID SURGERY     HIP ARTHROPLASTY     KNEE SURGERY Left 12/2013   LEFT HEART CATHETERIZATION WITH CORONARY ANGIOGRAM N/A 01/15/2013   Procedure: LEFT HEART CATHETERIZATION WITH CORONARY ANGIOGRAM;  Surgeon: Sinclair Grooms, MD;  Location[facility] CATH LAB;  Service: Cardiovascular;  Laterality: N/A;   MALONEY DILATION N/A 05/02/2013   Procedure: Venia Minks DILATION;  Surgeon: Rogene Houston, MD;  [facility];  Service: Endoscopy;  Laterality: N/A;   SAVORY DILATION N/A 05/02/2013   Procedure: SAVORY DILATION;  Surgeon: Rogene Houston, MD;  Location[facility];  Service: Endoscopy;  Laterality: N/A;   TONSILLECTOMY      Family History  Problem Relation Age of Onset   Stroke Mother    CAD Father        MI at age 90   Colon cancer Neg Hx    Social History:  reports that he quit smoking about 45 years ago. His smoking use included cigarettes. He has a 60.00 pack-year smoking history. His smokeless tobacco use includes chew. He reports that he does not drink alcohol and does not use drugs.  Allergies: No Known Allergies  Medications Prior to  Admission  Medication Sig Dispense Refill   albuterol (PROAIR HFA) 108 (90 BASE) MCG/ACT inhaler Inhale 2 puffs into the lungs every 6 (six) hours as needed for wheezing or shortness of breath. 1 Inhaler 2   amLODipine (NORVASC) 10 MG tablet Take 1 tablet (10 mg total) by mouth daily. 90 tablet 3   aspirin EC 81 MG tablet Take 1 tablet (81 mg total) by mouth daily. 90 tablet 3   levothyroxine (SYNTHROID) 150 MCG tablet Take 1 tablet (150 mcg total) by mouth daily. 90 tablet 1   lisinopril (ZESTRIL) 20 MG tablet Take 1 tablet (20 mg total) by mouth daily. 90 tablet 3   Multiple Vitamin (MULTIVITAMIN WITH MINERALS) TABS tablet Take 1 tablet by mouth daily.     oxyCODONE-acetaminophen (PERCOCET) 7.5-325 MG tablet Take 1 tablet by mouth every 6 (six) hours as needed for severe pain. 120 tablet 0   sertraline (ZOLOFT) 50 MG tablet Take 1 tablet (50 mg total) by mouth daily. 90 tablet 3   sildenafil (REVATIO) 20 MG tablet Take 1-3 tablets (20-60 mg total) by mouth as needed. 30 tablet 3   simvastatin (ZOCOR) 40 MG tablet Take 1 tablet (40 mg total) by mouth at bedtime. 90 tablet 3    No results found for this or any previous visit (from the past 48 hour(s)). No results found.  Review of Systems  Constitutional: Negative.   HENT: Negative.    Eyes: Negative.   Respiratory: Negative.    Cardiovascular: Negative.   Gastrointestinal:  Positive for anal bleeding.  Endocrine: Negative.   Genitourinary: Negative.   Musculoskeletal: Negative.   Skin: Negative.   Allergic/Immunologic: Negative.   Neurological: Negative.   Hematological: Negative.   Psychiatric/Behavioral: Negative.     Blood pressure 135/77, pulse 61, temperature (!) 87.9 F (31.1 C), temperature source Oral, resp. rate 18, SpO2 98 %. Physical Exam  GENERAL: The patient is AO x3, in no acute distress. HEENT: Head is normocephalic and atraumatic. EOMI are intact. Mouth is well hydrated and without lesions. NECK: Supple. No  masses LUNGS: Clear to auscultation. No presence of rhonchi/wheezing/rales. Adequate chest expansion HEART: RRR, normal s1 and s2. ABDOMEN: Soft, nontender, no guarding, no peritoneal signs, and nondistended. BS +. No masses. EXTREMITIES: Without any cyanosis, clubbing, rash, lesions or edema. NEUROLOGIC: AOx3, no focal motor deficit. SKIN: no jaundice, no rashes  Assessment/Plan YANDELL MCJUNKINS is a 75 y.o. male with past medical history of coronary artery disease status post stent placement, hypothyroidism, OSA, hyperlipidemia, who presents for evaluation of rectal bleeding.  We will proceed with colonoscopy.  Harvel Quale, MD 06/19/2020, 8:09 AM

## 2020-06-19 NOTE — H&P (Signed)
[name] is an 75 y.o. male.   Chief Complaint: rectal bleeding HPI: [name] is a 75 y.o. male with past medical history of coronary artery disease status post stent placement, hypothyroidism, OSA, hyperlipidemia, who presents for evaluation of rectal bleeding.  Patient has presented persistent symptoms of rectal bleeding since late February 2022.  He reports that he has had fresh blood every time he has a bowel movement.  This is not mixed with his stool but it comes "as water".  Denies any melena, abdominal pain, nausea, vomiting, fever, chills, use of NSAIDs or any blood thinners.  Last Colonoscopy: 2015  Prep excellent. Three small polyps ablated via cold biopsy from cecum and one from the ascending colon and these polyps were submitted together. (all TA) Few scattered diverticula at sigmoid colon. Normal rectal mucosa. Mall hemorrhoids with focal erythema noted below the dentate line.  Past Medical History:  Diagnosis Date   Anginal pain ([place])    Arthritis    OSTEO BACK KNEES HIPS & HANDS   Arthropathy, unspecified, site unspecified    Chronic airway obstruction, not elsewhere classified    Coronary atherosclerosis of unspecified type of vessel, native or graft    a. [place] 5/16:  mLAD 20%, CFX stent ok, OM3 stent ok, mRCA 30%, normal LVF   Headache(784.0)    Hx of echocardiogram    a. [place] 5/16:  mild LVH, EF 60%, mildly dilated Ao root (40 mm)   Hypothyroidism    Other and unspecified hyperlipidemia    Thyroid disease    hypothyroid   Unspecified essential hypertension    Unspecified sleep apnea    Dr. Jacelyn Grip at [facility] told pt he had sleep apnea, but never sent him for a sleep study so he doesn't wear a CPAP    Past Surgical History:  Procedure Laterality Date   ANKLE SURGERY     BALLOON DILATION N/A 05/02/2013   Procedure: BALLOON DILATION;  Surgeon: Rogene Houston, MD;  [facility];  Service: Endoscopy;  Laterality: N/A;   CARDIAC  CATHETERIZATION  01/15/2013   CARDIAC CATHETERIZATION N/A 05/16/2014   Procedure: Left Heart Cath and Coronary Angiography;  Surgeon: Peter M Martinique, MD;  Location: [place] [facility] LAB;  Service: Cardiovascular;  Laterality: N/A;   COLONOSCOPY N/A 05/02/2013   Procedure: COLONOSCOPY;  Surgeon: Rogene Houston, MD;  [facility];  Service: Endoscopy;  Laterality: N/A;  200-moved to 1200 Ann notified pt   COLONOSCOPY WITH PROPOFOL N/A 05/08/2020   Procedure: COLONOSCOPY WITH PROPOFOL;  Surgeon: Harvel Quale, MD;  [facility];  Service: Gastroenterology;  Laterality: N/A;  AM   CORONARY STENT PLACEMENT     2003, 2004   ESOPHAGOGASTRODUODENOSCOPY N/A 05/02/2013   Procedure: ESOPHAGOGASTRODUODENOSCOPY (EGD);  Surgeon: Rogene Houston, MD;  [facility];  Service: Endoscopy;  Laterality: N/A;   HEMORRHOID SURGERY     HIP ARTHROPLASTY     KNEE SURGERY Left 12/2013   LEFT HEART CATHETERIZATION WITH CORONARY ANGIOGRAM N/A 01/15/2013   Procedure: LEFT HEART CATHETERIZATION WITH CORONARY ANGIOGRAM;  Surgeon: Sinclair Grooms, MD;  Location: [facility] CATH LAB;  Service: Cardiovascular;  Laterality: N/A;   MALONEY DILATION N/A 05/02/2013   Procedure: Venia Minks DILATION;  Surgeon: Rogene Houston, MD;  [facility];  Service: Endoscopy;  Laterality: N/A;   SAVORY DILATION N/A 05/02/2013   Procedure: SAVORY DILATION;  Surgeon: Rogene Houston, MD;  [facility];  Service: Endoscopy;  Laterality: N/A;   TONSILLECTOMY      Family History  Problem Relation Age of Onset   Stroke Mother    CAD Father        MI at age 72   Colon cancer Neg Hx    Social History:  reports that he quit smoking about 45 years ago. His smoking use included cigarettes. He has a 60.00 pack-year smoking history. His smokeless tobacco use includes chew. He reports that he does not drink alcohol and does not use drugs.  Allergies: No Known Allergies  Medications Prior to  Admission  Medication Sig Dispense Refill   albuterol (PROAIR HFA) 108 (90 BASE) MCG/ACT inhaler Inhale 2 puffs into the lungs every 6 (six) hours as needed for wheezing or shortness of breath. 1 Inhaler 2   amLODipine (NORVASC) 10 MG tablet Take 1 tablet (10 mg total) by mouth daily. 90 tablet 3   aspirin EC 81 MG tablet Take 1 tablet (81 mg total) by mouth daily. 90 tablet 3   levothyroxine (SYNTHROID) 150 MCG tablet Take 1 tablet (150 mcg total) by mouth daily. 90 tablet 1   lisinopril (ZESTRIL) 20 MG tablet Take 1 tablet (20 mg total) by mouth daily. 90 tablet 3   Multiple Vitamin (MULTIVITAMIN WITH MINERALS) TABS tablet Take 1 tablet by mouth daily.     oxyCODONE-acetaminophen (PERCOCET) 7.5-325 MG tablet Take 1 tablet by mouth every 6 (six) hours as needed for severe pain. 120 tablet 0   sertraline (ZOLOFT) 50 MG tablet Take 1 tablet (50 mg total) by mouth daily. 90 tablet 3   sildenafil (REVATIO) 20 MG tablet Take 1-3 tablets (20-60 mg total) by mouth as needed. 30 tablet 3   simvastatin (ZOCOR) 40 MG tablet Take 1 tablet (40 mg total) by mouth at bedtime. 90 tablet 3    No results found for this or any previous visit (from the past 48 hour(s)). No results found.  Review of Systems  Constitutional: Negative.   HENT: Negative.    Eyes: Negative.   Respiratory: Negative.    Cardiovascular: Negative.   Gastrointestinal:  Positive for anal bleeding.  Endocrine: Negative.   Genitourinary: Negative.   Musculoskeletal: Negative.   Skin: Negative.   Allergic/Immunologic: Negative.   Neurological: Negative.   Hematological: Negative.   Psychiatric/Behavioral: Negative.     Blood pressure 135/77, pulse 61, temperature (!) 87.9 F (31.1 C), temperature source Oral, resp. rate 18, SpO2 98 %. Physical Exam  GENERAL: The patient is AO x3, in no acute distress. HEENT: Head is normocephalic and atraumatic. EOMI are intact. Mouth is well hydrated and without lesions. NECK: Supple. No  masses LUNGS: Clear to auscultation. No presence of rhonchi/wheezing/rales. Adequate chest expansion HEART: RRR, normal s1 and s2. ABDOMEN: Soft, nontender, no guarding, no peritoneal signs, and nondistended. BS +. No masses. EXTREMITIES: Without any cyanosis, clubbing, rash, lesions or edema. NEUROLOGIC: AOx3, no focal motor deficit. SKIN: no jaundice, no rashes  Assessment/Plan [name] is a 75 y.o. male with past medical history of coronary artery disease status post stent placement, hypothyroidism, OSA, hyperlipidemia, who presents for evaluation of rectal bleeding.  We will proceed with colonoscopy.  Harvel Quale, MD 06/19/2020, 8:09 AM

## 2020-06-19 NOTE — Op Note (Signed)
[facility] Patient Name: [name] Procedure Date: 06/19/2020 8:03 AM MRN: [id number] Date of Birth: 1945/09/16 Attending MD: Maylon Peppers ,  CSN: [id number] Age: 75 Admit Type: Outpatient Procedure:                Colonoscopy Indications:              Rectal bleeding Providers:                Maylon Peppers, Oneida Page, [place] Risa Grill, Technician Referring MD:              Medicines:                Monitored Anesthesia Care Complications:            No immediate complications. Estimated Blood Loss:     Estimated blood loss: none. Procedure:                Pre-Anesthesia Assessment:                           - Prior to the procedure, a History and Physical                            was performed, and patient medications, allergies                            and sensitivities were reviewed. The patient's                            tolerance of previous anesthesia was reviewed.                           - The risks and benefits of the procedure and the                            sedation options and risks were discussed with the                            patient. All questions were answered and informed                            consent was obtained.                           - ASA Grade Assessment: III - A patient with severe                            systemic disease.                           After obtaining informed consent, the colonoscope                            was passed under direct vision. Throughout the  procedure, the patient's blood pressure, pulse, and                            oxygen saturations were monitored continuously. The                            PCF-HQ190L ([id number]) scope was introduced through                            the anus and advanced to the the cecum, identified                            by appendiceal orifice and ileocecal valve. The                             colonoscopy was technically difficult and complex                            due to poor bowel prep. The patient tolerated the                            procedure well. The quality of the bowel                            preparation was poor. Scope In: 8:16:02 AM Scope Out: 8:39:45 AM Scope Withdrawal Time: 0 hours 10 minutes 30 seconds  Total Procedure Duration: 0 hours 23 minutes 43 seconds  Findings:      Skin tags were found on perianal exam.      Copious quantities of semi-liquid stool was found in the entire colon,       making visualization difficult. Despite extensive irrigatio and suction,       this could not be removed. I did not observe any large masses upon       evaluation, but given the quality of the prep I could have missed colon       polyps.      Bleeding internal hemorrhoids were found during retroflexion. The       hemorrhoids were medium-sized. Impression:               - Preparation of the colon was poor.                           - Perianal skin tags found on perianal exam.                           - Stool in the entire examined colon. No large                            masses seen in the lumen.                           - Bleeding internal hemorrhoids.                           - No specimens collected. Moderate Sedation:  Per Anesthesia Care Recommendation:           - Discharge patient to home (ambulatory).                           - Resume previous diet.                           - Repeat colonoscopy at the next available                            appointment because the bowel preparation was poor.                            Will require 1 week of Miralax three times a day                            and 2-day standard prep.                           - Consider using Movantik 12.5 mg for opiod-induced                            constipation.                           - Referral to hemorrhoidal band ligation with Roseanne Kaufman, NP. Procedure Code(s):        --- Professional ---                           [phone], Colonoscopy, flexible; diagnostic, including                            collection of specimen(s) by brushing or washing,                            when performed (separate procedure) Diagnosis Code(s):        --- Professional ---                           K64.8, Other hemorrhoids                           K64.4, Residual hemorrhoidal skin tags                           K62.5, Hemorrhage of anus and rectum CPT copyright 2019 American Medical Association. All rights reserved. The codes documented in this report are preliminary and upon coder review may  be revised to meet current compliance requirements. Maylon Peppers, MD Maylon Peppers,  06/19/2020 8:47:13 AM This report has been signed electronically. Number of Addenda: 0

## 2020-06-19 NOTE — Discharge Instructions (Addendum)
You are being discharged to home.  Resume your previous diet.  Your physician has recommended a repeat colonoscopy at the next available appointment because the bowel preparation was poor. Will require 1 week of Miralax three times a day and 2-day standard prep. Consider using Movantik 12.5 mg for opiod-induced constipation. Referral to hemorrhoidal band ligation with Roseanne Kaufman, NP.

## 2020-06-19 NOTE — Anesthesia Preprocedure Evaluation (Signed)
Anesthesia Evaluation  Patient identified by MRN, date of birth, ID band Patient awake    Reviewed: Allergy & Precautions, H&P , NPO status , Patient's Chart, lab work & pertinent test results, reviewed documented beta blocker date and time   Airway Mallampati: II  TM Distance: >3 FB Neck ROM: full    Dental no notable dental hx.    Pulmonary sleep apnea , COPD, former smoker,    Pulmonary exam normal breath sounds clear to auscultation       Cardiovascular Exercise Tolerance: Good hypertension, + CAD and + Cardiac Stents   Rhythm:regular Rate:Normal     Neuro/Psych  Headaches, negative psych ROS   GI/Hepatic Neg liver ROS, GERD  Medicated,  Endo/Other  Hypothyroidism   Renal/GU negative Renal ROS  negative genitourinary   Musculoskeletal   Abdominal   Peds  Hematology negative hematology ROS (+)   Anesthesia Other Findings   Reproductive/Obstetrics negative OB ROS                             Anesthesia Physical Anesthesia Plan  ASA: 3  Anesthesia Plan: General   Post-op Pain Management:    Induction:   PONV Risk Score and Plan: Propofol infusion  Airway Management Planned:   Additional Equipment:   Intra-op Plan:   Post-operative Plan:   Informed Consent: I have reviewed the patients History and Physical, chart, labs and discussed the procedure including the risks, benefits and alternatives for the proposed anesthesia with the patient or authorized representative who has indicated his/her understanding and acceptance.     Dental Advisory Given  Plan Discussed with: CRNA  Anesthesia Plan Comments:         Anesthesia Quick Evaluation

## 2020-06-22 ENCOUNTER — Encounter: Payer: Self-pay | Admitting: Family Medicine

## 2020-06-22 ENCOUNTER — Ambulatory Visit (INDEPENDENT_AMBULATORY_CARE_PROVIDER_SITE_OTHER): Payer: Medicare Other | Admitting: Family Medicine

## 2020-06-22 ENCOUNTER — Other Ambulatory Visit: Payer: Self-pay

## 2020-06-22 VITALS — BP 123/81 | HR 64 | Ht 72.0 in | Wt 222.0 lb

## 2020-06-22 DIAGNOSIS — M4712 Other spondylosis with myelopathy, cervical region: Secondary | ICD-10-CM

## 2020-06-22 DIAGNOSIS — E079 Disorder of thyroid, unspecified: Secondary | ICD-10-CM | POA: Diagnosis not present

## 2020-06-22 DIAGNOSIS — G8929 Other chronic pain: Secondary | ICD-10-CM

## 2020-06-22 DIAGNOSIS — Z0289 Encounter for other administrative examinations: Secondary | ICD-10-CM

## 2020-06-22 DIAGNOSIS — F320 Major depressive disorder, single episode, mild: Secondary | ICD-10-CM

## 2020-06-22 DIAGNOSIS — E782 Mixed hyperlipidemia: Secondary | ICD-10-CM | POA: Diagnosis not present

## 2020-06-22 DIAGNOSIS — I10 Essential (primary) hypertension: Secondary | ICD-10-CM

## 2020-06-22 MED ORDER — AMLODIPINE BESYLATE 10 MG PO TABS: 10.0000 mg | ORAL_TABLET | Freq: Every day | ORAL | 3 refills | Status: DC

## 2020-06-22 MED ORDER — LEVOTHYROXINE SODIUM 150 MCG PO TABS: 150.0000 ug | ORAL_TABLET | Freq: Every day | ORAL | 3 refills | Status: DC

## 2020-06-22 MED ORDER — SIMVASTATIN 40 MG PO TABS: 40.0000 mg | ORAL_TABLET | Freq: Every day | ORAL | 3 refills | Status: DC

## 2020-06-22 MED ORDER — OXYCODONE-ACETAMINOPHEN 7.5-325 MG PO TABS: 1.0000 | ORAL_TABLET | Freq: Four times a day (QID) | ORAL | 0 refills | Status: DC | PRN

## 2020-06-22 MED ORDER — SERTRALINE HCL 50 MG PO TABS: 50.0000 mg | ORAL_TABLET | Freq: Every day | ORAL | 3 refills | Status: DC

## 2020-06-22 NOTE — Progress Notes (Signed)
BP 123/81   Pulse 64   Ht 6' (1.829 m)   Wt 222 lb (100.7 kg)   SpO2 96%   BMI 30.11 kg/m    Subjective:   Patient ID: [name], male    DOB: 09/22/1945, 75 y.o.   MRN: [id number]  HPI: [name] is a 75 y.o. male presenting on 06/22/2020 for Medical Management of Chronic Issues, Hypertension, and Hypothyroidism   HPI Hypertension Patient is currently on lisinopril and amlodipine, and their blood pressure today is 123/81. Patient denies any lightheadedness or dizziness. Patient denies headaches, blurred vision, chest pains, shortness of breath, or weakness. Denies any side effects from medication and is content with current medication.   Hypothyroidism recheck Patient is coming in for thyroid recheck today as well. They deny any issues with hair changes or heat or cold problems or diarrhea or constipation. They deny any chest pain or palpitations. They are currently on levothyroxine 150 micrograms   Hyperlipidemia Patient is coming in for recheck of his hyperlipidemia. The patient is currently taking simvastatin. They deny any issues with myalgias or history of liver damage from it. They deny any focal numbness or weakness or chest pain.   Pain assessment: Cause of pain-degenerative disc disease cervical spine Pain location-neck bilateral Pain on scale of 1-10- 4 Frequency-Daily What increases pain-certain movements or weather changes What makes pain Better-Percocet Effects on ADL -very little limitations Any change in general medical condition-none  Current opioids rx-oxycodone-acetaminophen 7.5-325 4 times daily as needed # meds rx-120 Effectiveness of current meds-works well Adverse reactions from pain meds-none Morphine equivalent-45  Pill count performed-No Last drug screen -03/27/2020 ( high risk q89m moderate risk q654mlow risk yearly ) Urine drug screen today- No [place]  If yes were their any concerning findings?  -None  Opioid Risk  03/27/2019  Alcohol 0  Illegal Drugs 0  Rx Drugs 0  Alcohol 0  Illegal Drugs 0  Rx Drugs 0  Age between 16-45 years  0  History of Preadolescent Sexual Abuse 0  Psychological Disease 0  Depression 1  Opioid Risk Tool Scoring 1  Opioid Risk Interpretation Low Risk     Pain contract signed on: 03/27/2020  Relevant past medical, surgical, family and social history reviewed and updated as indicated. Interim medical history since our last visit reviewed. Allergies and medications reviewed and updated.  Review of Systems  Constitutional:  Negative for chills and fever.  Respiratory:  Negative for shortness of breath and wheezing.   Cardiovascular:  Negative for chest pain and leg swelling.  Musculoskeletal:  Negative for back pain and gait problem.  Skin:  Negative for rash.  Neurological:  Negative for dizziness, weakness and light-headedness.  All other systems reviewed and are negative.  Per HPI unless specifically indicated above   Allergies as of 06/22/2020   No Known Allergies      Medication List        Accurate as of June 22, 2020  8:53 AM. If you have any questions, ask your nurse or doctor.          albuterol 108 (90 Base) MCG/ACT inhaler Commonly known as: ProAir HFA Inhale 2 puffs into the lungs every 6 (six) hours as needed for wheezing or shortness of breath.   amLODipine 10 MG tablet Commonly known as: NORVASC Take 1 tablet (10 mg total) by mouth daily.   aspirin EC 81 MG tablet Take 1 tablet (81 mg total) by mouth  daily.   levothyroxine 150 MCG tablet Commonly known as: SYNTHROID Take 1 tablet (150 mcg total) by mouth daily.   lisinopril 20 MG tablet Commonly known as: ZESTRIL Take 1 tablet (20 mg total) by mouth daily.   multivitamin with minerals Tabs tablet Take 1 tablet by mouth daily.   naloxegol oxalate 12.5 MG Tabs tablet Commonly known as: Movantik Take 1 tablet (12.5 mg total) by mouth daily.    oxyCODONE-acetaminophen 7.5-325 MG tablet Commonly known as: Percocet Take 1 tablet by mouth every 6 (six) hours as needed for severe pain.   sertraline 50 MG tablet Commonly known as: Zoloft Take 1 tablet (50 mg total) by mouth daily.   sildenafil 20 MG tablet Commonly known as: REVATIO Take 1-3 tablets (20-60 mg total) by mouth as needed.   simvastatin 40 MG tablet Commonly known as: ZOCOR Take 1 tablet (40 mg total) by mouth at bedtime.         Objective:   BP 123/81   Pulse 64   Ht 6' (1.829 m)   Wt 222 lb (100.7 kg)   SpO2 96%   BMI 30.11 kg/m   Wt Readings from Last 3 Encounters:  06/22/20 222 lb (100.7 kg)  06/17/20 219 lb (99.3 kg)  05/29/20 225 lb (102.1 kg)    Physical Exam Vitals and nursing note reviewed.  Constitutional:      General: He is not in acute distress.    Appearance: He is well-developed. He is not diaphoretic.  Eyes:     General: No scleral icterus.    Conjunctiva/sclera: Conjunctivae normal.  Neck:     Thyroid: No thyromegaly.  Cardiovascular:     Rate and Rhythm: Normal rate and regular rhythm.     Heart sounds: Normal heart sounds. No murmur heard. Pulmonary:     Effort: Pulmonary effort is normal. No respiratory distress.     Breath sounds: Normal breath sounds. No wheezing.  Musculoskeletal:        General: Normal range of motion.     Cervical back: Neck supple.  Skin:    General: Skin is warm and dry.     Findings: No rash.  Neurological:     Mental Status: He is alert and oriented to person, place, and time.     Coordination: Coordination normal.  Psychiatric:        Behavior: Behavior normal.      Assessment & Plan:   Problem List Items Addressed This Visit       Cardiovascular and Mediastinum   Essential hypertension   Relevant Medications   amLODipine (NORVASC) 10 MG tablet   simvastatin (ZOCOR) 40 MG tablet   Other Relevant Orders   CBC with Differential/Platelet   CMP14+EGFR     Endocrine   Thyroid  disease   Relevant Medications   levothyroxine (SYNTHROID) 150 MCG tablet   Other Relevant Orders   TSH     Other   Hyperlipidemia - Primary   Relevant Medications   amLODipine (NORVASC) 10 MG tablet   simvastatin (ZOCOR) 40 MG tablet   Other Relevant Orders   Lipid panel   Pain management contract signed   Relevant Medications   oxyCODONE-acetaminophen (PERCOCET) 7.5-325 MG tablet   oxyCODONE-acetaminophen (PERCOCET) 7.5-325 MG tablet (Start on 07/21/2020)   oxyCODONE-acetaminophen (PERCOCET) 7.5-325 MG tablet (Start on 09/20/2020)   Other Visit Diagnoses     Encounter for chronic pain management       Relevant Medications   oxyCODONE-acetaminophen (PERCOCET) 7.5-325 MG  tablet   oxyCODONE-acetaminophen (PERCOCET) 7.5-325 MG tablet (Start on 07/21/2020)   oxyCODONE-acetaminophen (PERCOCET) 7.5-325 MG tablet (Start on 09/20/2020)   Osteoarthritis of cervical spine with myelopathy       Relevant Medications   oxyCODONE-acetaminophen (PERCOCET) 7.5-325 MG tablet   oxyCODONE-acetaminophen (PERCOCET) 7.5-325 MG tablet (Start on 07/21/2020)   oxyCODONE-acetaminophen (PERCOCET) 7.5-325 MG tablet (Start on 09/20/2020)   Depression, major, single episode, mild (HCC)       Relevant Medications   sertraline (ZOLOFT) 50 MG tablet       Continue current medication, no changes, seems to be doing well, will check blood work today Follow up plan: Return in about 3 months (around 09/22/2020), or if symptoms worsen or fail to improve, for Pain management hypertension and thyroid.  Counseling provided for all of the vaccine components No orders of the defined types were placed in this encounter.   Caryl Pina, MD [facility] 06/22/2020, 8:53 AM

## 2020-06-23 ENCOUNTER — Telehealth (INDEPENDENT_AMBULATORY_CARE_PROVIDER_SITE_OTHER): Payer: Self-pay | Admitting: *Deleted

## 2020-06-23 LAB — LIPID PANEL
Chol/HDL Ratio: 2.9 ratio (ref 0.0–5.0)
Cholesterol, Total: 137 mg/dL (ref 100–199)
HDL: 47 mg/dL (ref >39)
LDL Chol Calc (NIH): 62 mg/dL (ref 0–99)
Triglycerides: 164 mg/dL — ABNORMAL HIGH (ref 0–149)
VLDL Cholesterol Cal: 28 mg/dL (ref 5–40)

## 2020-06-23 LAB — CBC WITH DIFFERENTIAL/PLATELET
Basophils Absolute: 0 10*3/uL (ref 0.0–0.2)
Basos: 1 %
EOS (ABSOLUTE): 0.2 10*3/uL (ref 0.0–0.4)
Eos: 3 %
Hematocrit: 36.4 % — ABNORMAL LOW (ref 37.5–51.0)
Hemoglobin: 11.9 g/dL — ABNORMAL LOW (ref 13.0–17.7)
Immature Grans (Abs): 0 10*3/uL (ref 0.0–0.1)
Immature Granulocytes: 0 %
Lymphocytes Absolute: 1.7 10*3/uL (ref 0.7–3.1)
Lymphs: 23 %
MCH: 32.5 pg (ref 26.6–33.0)
MCHC: 32.7 g/dL (ref 31.5–35.7)
MCV: 100 fL — ABNORMAL HIGH (ref 79–97)
Monocytes Absolute: 0.8 10*3/uL (ref 0.1–0.9)
Monocytes: 10 %
Neutrophils Absolute: 4.6 10*3/uL (ref 1.4–7.0)
Neutrophils: 63 %
Platelets: 264 10*3/uL (ref 150–450)
RBC: 3.66 x10E6/uL — ABNORMAL LOW (ref 4.14–5.80)
RDW: 12.4 % (ref 11.6–15.4)
WBC: 7.3 10*3/uL (ref 3.4–10.8)

## 2020-06-23 LAB — CMP14+EGFR
ALT: 10 IU/L (ref 0–44)
AST: 16 IU/L (ref 0–40)
Albumin/Globulin Ratio: 2.1 (ref 1.2–2.2)
Albumin: 4.2 g/dL (ref 3.7–4.7)
Alkaline Phosphatase: 47 IU/L (ref 44–121)
BUN/Creatinine Ratio: 15 (ref 10–24)
BUN: 14 mg/dL (ref 8–27)
Bilirubin Total: 0.3 mg/dL (ref 0.0–1.2)
CO2: 22 mmol/L (ref 20–29)
Calcium: 9.4 mg/dL (ref 8.6–10.2)
Chloride: 103 mmol/L (ref 96–106)
Creatinine, Ser: 0.96 mg/dL (ref 0.76–1.27)
Globulin, Total: 2 g/dL (ref 1.5–4.5)
Glucose: 93 mg/dL (ref 65–99)
Potassium: 4.7 mmol/L (ref 3.5–5.2)
Sodium: 138 mmol/L (ref 134–144)
Total Protein: 6.2 g/dL (ref 6.0–8.5)
eGFR: 82 mL/min/{1.73_m2} (ref >59)

## 2020-06-23 LAB — TSH: TSH: 8.14 u[IU]/mL — ABNORMAL HIGH (ref 0.450–4.500)

## 2020-06-23 NOTE — Telephone Encounter (Signed)
Per op note - Repeat colonoscopy at the next available appointment because the bowel preparation was poor. Will require 1 week of Miralax three times a day and 2-day standard prep.

## 2020-06-24 ENCOUNTER — Ambulatory Visit: Payer: Medicare Other | Admitting: Gastroenterology

## 2020-06-24 ENCOUNTER — Encounter: Payer: Self-pay | Admitting: Gastroenterology

## 2020-06-24 ENCOUNTER — Encounter (INDEPENDENT_AMBULATORY_CARE_PROVIDER_SITE_OTHER): Payer: Self-pay

## 2020-06-24 ENCOUNTER — Other Ambulatory Visit: Payer: Self-pay

## 2020-06-24 ENCOUNTER — Telehealth (INDEPENDENT_AMBULATORY_CARE_PROVIDER_SITE_OTHER): Payer: Self-pay

## 2020-06-24 ENCOUNTER — Other Ambulatory Visit (INDEPENDENT_AMBULATORY_CARE_PROVIDER_SITE_OTHER): Payer: Self-pay

## 2020-06-24 VITALS — BP 114/68 | HR 68 | Temp 97.7°F | Ht 72.0 in | Wt 218.4 lb

## 2020-06-24 DIAGNOSIS — K641 Second degree hemorrhoids: Secondary | ICD-10-CM

## 2020-06-24 DIAGNOSIS — K625 Hemorrhage of anus and rectum: Secondary | ICD-10-CM

## 2020-06-24 DIAGNOSIS — D126 Benign neoplasm of colon, unspecified: Secondary | ICD-10-CM

## 2020-06-24 MED ORDER — MAGNESIUM CITRATE PO SOLN: 1.0000 | Freq: Once | ORAL | 0 refills | Status: AC

## 2020-06-24 MED ORDER — PEG 3350-KCL-NA BICARB-NACL 420 G PO SOLR: 4000.0000 mL | ORAL | 0 refills | Status: DC

## 2020-06-24 NOTE — Progress Notes (Signed)
[place] Banding Note:   [name] is a 75 year old male presenting at the request of Dr. Jenetta Downer due to symptomatic hemorrhoids. He has a history of adenomas in 2015 and attempted colonoscopy recently was aborted due to inadequate prep. Colonoscopy now upcoming in July 2022. He is on Movantik for OIC. Notes 10 minutes on toilet, occasionally straining.   The patient presents with symptomatic grade 2 hemorrhoids, unresponsive to maximal medical therapy, requesting rubber band ligation of his hemorrhoidal disease. All risks, benefits, and alternative forms of therapy were described and informed consent was obtained.  The decision was made to band the left lateral  internal hemorrhoid, and the [place] was used to perform band ligation without complication. Digital anorectal examination was then performed to assure proper positioning of the band, and to adjust the banded tissue as required. The patient was discharged home without pain or other issues. Dietary and behavioral recommendations were given and (if necessary prescriptions were given), along with follow-up instructions. The patient will return in several weeks for followup and additional banding. Will focus on the right anterior at next appointment.   No complications were encountered and the patient tolerated the procedure well.  Annitta Needs, PhD, ANP-BC [facility]

## 2020-06-24 NOTE — Patient Instructions (Signed)
We will see you back for your next banding!  Continue to avoid straining, limit toilet time to 2-3 minutes, and avoid constipation.   It was a pleasure to see you today. I want to create trusting relationships with patients to provide genuine, compassionate, and quality care. I value your feedback. If you receive a survey regarding your visit,  I greatly appreciate you taking time to fill this out.   Annitta Needs, PhD, ANP-BC [facility]

## 2020-06-24 NOTE — Telephone Encounter (Signed)
[name], CMA  

## 2020-06-25 ENCOUNTER — Encounter (HOSPITAL_COMMUNITY): Payer: Self-pay | Admitting: Gastroenterology

## 2020-06-29 NOTE — Progress Notes (Signed)
No VM - memory is full - [id number]-jhb

## 2020-06-30 ENCOUNTER — Other Ambulatory Visit: Payer: Self-pay

## 2020-06-30 MED ORDER — LEVOTHYROXINE SODIUM 175 MCG PO TABS: 175.0000 ug | ORAL_TABLET | Freq: Every day | ORAL | 1 refills | Status: DC

## 2020-07-17 ENCOUNTER — Ambulatory Visit (HOSPITAL_COMMUNITY)
Admission: RE | Admit: 2020-07-17 | Discharge: 2020-07-17 | Disposition: A | Discharge status: Home / Self Care | Payer: Medicare Other | Attending: Gastroenterology | Admitting: Gastroenterology

## 2020-07-17 ENCOUNTER — Ambulatory Visit (HOSPITAL_COMMUNITY): Payer: Medicare Other | Admitting: Anesthesiology

## 2020-07-17 ENCOUNTER — Encounter (HOSPITAL_COMMUNITY)
Admission: RE | Disposition: A | Discharge status: Home / Self Care | Payer: Self-pay | Source: Home / Self Care | Attending: Gastroenterology

## 2020-07-17 ENCOUNTER — Other Ambulatory Visit: Payer: Self-pay

## 2020-07-17 ENCOUNTER — Encounter (HOSPITAL_COMMUNITY): Payer: Self-pay | Admitting: Gastroenterology

## 2020-07-17 DIAGNOSIS — D122 Benign neoplasm of ascending colon: Secondary | ICD-10-CM | POA: Diagnosis not present

## 2020-07-17 DIAGNOSIS — Z86010 Personal history of colonic polyps: Secondary | ICD-10-CM

## 2020-07-17 DIAGNOSIS — Z87891 Personal history of nicotine dependence: Secondary | ICD-10-CM | POA: Diagnosis not present

## 2020-07-17 DIAGNOSIS — I25119 Atherosclerotic heart disease of native coronary artery with unspecified angina pectoris: Secondary | ICD-10-CM | POA: Diagnosis not present

## 2020-07-17 DIAGNOSIS — K635 Polyp of colon: Secondary | ICD-10-CM | POA: Diagnosis not present

## 2020-07-17 DIAGNOSIS — Z79890 Hormone replacement therapy (postmenopausal): Secondary | ICD-10-CM | POA: Insufficient documentation

## 2020-07-17 DIAGNOSIS — Z823 Family history of stroke: Secondary | ICD-10-CM | POA: Diagnosis not present

## 2020-07-17 DIAGNOSIS — K5140 Inflammatory polyps of colon without complications: Secondary | ICD-10-CM | POA: Diagnosis not present

## 2020-07-17 DIAGNOSIS — D123 Benign neoplasm of transverse colon: Secondary | ICD-10-CM

## 2020-07-17 DIAGNOSIS — K6289 Other specified diseases of anus and rectum: Secondary | ICD-10-CM

## 2020-07-17 DIAGNOSIS — K6389 Other specified diseases of intestine: Secondary | ICD-10-CM | POA: Insufficient documentation

## 2020-07-17 DIAGNOSIS — E039 Hypothyroidism, unspecified: Secondary | ICD-10-CM | POA: Diagnosis not present

## 2020-07-17 DIAGNOSIS — Z1211 Encounter for screening for malignant neoplasm of colon: Secondary | ICD-10-CM | POA: Diagnosis not present

## 2020-07-17 DIAGNOSIS — K649 Unspecified hemorrhoids: Secondary | ICD-10-CM | POA: Insufficient documentation

## 2020-07-17 DIAGNOSIS — G4733 Obstructive sleep apnea (adult) (pediatric): Secondary | ICD-10-CM | POA: Diagnosis not present

## 2020-07-17 DIAGNOSIS — Z7982 Long term (current) use of aspirin: Secondary | ICD-10-CM | POA: Diagnosis not present

## 2020-07-17 DIAGNOSIS — Z79899 Other long term (current) drug therapy: Secondary | ICD-10-CM | POA: Diagnosis not present

## 2020-07-17 DIAGNOSIS — Z09 Encounter for follow-up examination after completed treatment for conditions other than malignant neoplasm: Secondary | ICD-10-CM

## 2020-07-17 DIAGNOSIS — Z8249 Family history of ischemic heart disease and other diseases of the circulatory system: Secondary | ICD-10-CM | POA: Insufficient documentation

## 2020-07-17 DIAGNOSIS — K626 Ulcer of anus and rectum: Secondary | ICD-10-CM | POA: Insufficient documentation

## 2020-07-17 DIAGNOSIS — D120 Benign neoplasm of cecum: Secondary | ICD-10-CM | POA: Diagnosis not present

## 2020-07-17 DIAGNOSIS — K625 Hemorrhage of anus and rectum: Secondary | ICD-10-CM

## 2020-07-17 DIAGNOSIS — K5730 Diverticulosis of large intestine without perforation or abscess without bleeding: Secondary | ICD-10-CM | POA: Diagnosis not present

## 2020-07-17 DIAGNOSIS — I2510 Atherosclerotic heart disease of native coronary artery without angina pectoris: Secondary | ICD-10-CM | POA: Diagnosis not present

## 2020-07-17 DIAGNOSIS — D126 Benign neoplasm of colon, unspecified: Secondary | ICD-10-CM

## 2020-07-17 DIAGNOSIS — E785 Hyperlipidemia, unspecified: Secondary | ICD-10-CM | POA: Diagnosis not present

## 2020-07-17 HISTORY — PX: COLONOSCOPY WITH PROPOFOL: SHX5780

## 2020-07-17 HISTORY — PX: POLYPECTOMY: SHX5525

## 2020-07-17 HISTORY — PX: HEMOSTASIS CLIP PLACEMENT: SHX6857

## 2020-07-17 HISTORY — PX: BIOPSY: SHX5522

## 2020-07-17 LAB — HM COLONOSCOPY

## 2020-07-17 SURGERY — COLONOSCOPY WITH PROPOFOL
Anesthesia: General

## 2020-07-17 MED ORDER — PROPOFOL 10 MG/ML IV BOLUS
INTRAVENOUS | Status: AC
  Filled 2020-07-17: qty 60

## 2020-07-17 MED ORDER — LACTATED RINGERS IV SOLN: INTRAVENOUS | Status: DC

## 2020-07-17 MED ORDER — EPHEDRINE SULFATE 50 MG/ML IJ SOLN
INTRAMUSCULAR | Status: DC | PRN
  Administered 2020-07-17: 10 mg via INTRAVENOUS

## 2020-07-17 MED ORDER — PROPOFOL 10 MG/ML IV BOLUS
INTRAVENOUS | Status: DC | PRN
  Administered 2020-07-17: 20 mg via INTRAVENOUS
  Administered 2020-07-17: 50 mg via INTRAVENOUS
  Administered 2020-07-17: 20 mg via INTRAVENOUS
  Administered 2020-07-17: 40 mg via INTRAVENOUS
  Administered 2020-07-17 (×3): 20 mg via INTRAVENOUS
  Administered 2020-07-17: 50 mg via INTRAVENOUS

## 2020-07-17 MED ORDER — STERILE WATER FOR IRRIGATION IR SOLN
Status: DC | PRN
  Administered 2020-07-17: 100 mL

## 2020-07-17 MED ORDER — TRYPAN BLUE 0.06 % OP SOLN
OPHTHALMIC | Status: AC
  Filled 2020-07-17: qty 0.5

## 2020-07-17 MED ORDER — PHENYLEPHRINE-KETOROLAC 1-0.3 % IO SOLN
INTRAOCULAR | Status: AC
  Filled 2020-07-17: qty 4

## 2020-07-17 MED ORDER — EPINEPHRINE PF 1 MG/ML IJ SOLN
INTRAMUSCULAR | Status: AC
  Filled 2020-07-17: qty 1

## 2020-07-17 MED ORDER — PROPOFOL 500 MG/50ML IV EMUL
INTRAVENOUS | Status: DC | PRN
  Administered 2020-07-17: 150 ug/kg/min via INTRAVENOUS

## 2020-07-17 NOTE — Interval H&P Note (Signed)
History and Physical Interval Note:  07/17/2020 8:35 AM [name] is a 75 y.o. male with past medical history of coronary artery disease status post stent placement, hypothyroidism, OSA, hyperlipidemia, who presents for follow up colon polyps.  Patient has had a suboptimal prep in last 2 colonoscopies performed in the last 2 months. States he only had some scant rectal bleeding yesterday. Moving his bowels x2 per week, has not picked up the Movantik yet. The patient denies having any nausea, vomiting, fever, chills, melena, hematemesis, abdominal distention, abdominal pain, diarrhea, jaundice, pruritus or weight loss.   BP (!) 150/93   Temp 98.2 F (36.8 C) (Oral)   Resp 15   Ht 6' (1.829 m)   Wt 94.8 kg   SpO2 96%   BMI 28.35 kg/m  GENERAL: The patient is AO x3, in no acute distress. HEENT: Head is normocephalic and atraumatic. EOMI are intact. Mouth is well hydrated and without lesions. NECK: Supple. No masses LUNGS: Clear to auscultation. No presence of rhonchi/wheezing/rales. Adequate chest expansion HEART: RRR, normal s1 and s2. ABDOMEN: Soft, nontender, no guarding, no peritoneal signs, and nondistended. BS +. No masses. EXTREMITIES: Without any cyanosis, clubbing, rash, lesions or edema. NEUROLOGIC: AOx3, no focal motor deficit. SKIN: no jaundice, no rashes   [name]  has presented today for surgery, with the diagnosis of Rectal bleeding Colon Adenoma.  The various methods of treatment have been discussed with the patient and family. After consideration of risks, benefits and other options for treatment, the patient has consented to  Procedure(s) with comments: COLONOSCOPY WITH PROPOFOL (N/A) - 9:00 as a surgical intervention.  The patient's history has been reviewed, patient examined, no change in status, stable for surgery.  I have reviewed the patient's chart and labs.  Questions were answered to the patient's satisfaction.     [name]

## 2020-07-17 NOTE — Op Note (Signed)
[facility] Patient Name: [name] Procedure Date: 07/17/2020 9:16 AM MRN: [id number] Date of Birth: 10/11/1945 Attending MD: Maylon Peppers ,  CSN: [id number] Age: 75 Admit Type: Outpatient Procedure:                Colonoscopy Indications:              High risk colon cancer surveillance: Personal                            history of colonic polyps Providers:                Maylon Peppers, Lambert Mody Raphael Gibney,                            Technician Referring MD:              Medicines:                Monitored Anesthesia Care Complications:            No immediate complications. Estimated Blood Loss:     Estimated blood loss: none. Procedure:                Pre-Anesthesia Assessment:                           - Prior to the procedure, a History and Physical                            was performed, and patient medications, allergies                            and sensitivities were reviewed. The patient's                            tolerance of previous anesthesia was reviewed.                           - The risks and benefits of the procedure and the                            sedation options and risks were discussed with the                            patient. All questions were answered and informed                            consent was obtained.                           - ASA Grade Assessment: III - A patient with severe                            systemic disease.                           After obtaining informed consent, the colonoscope  was passed under direct vision. Throughout the                            procedure, the patient's blood pressure, pulse, and                            oxygen saturations were monitored continuously. The                            [id number]-[id number] ([id number]) scope was introduced through                            the anus and advanced to the the cecum, identified                             by appendiceal orifice and ileocecal valve. The                            colonoscopy was technically difficult and complex                            due to significant looping. Successful completion                            of the procedure was aided by applying abdominal                            pressure. The quality of the bowel preparation was                            adequate. Scope In: 9:27:40 AM Scope Out: 10:24:58 AM Scope Withdrawal Time: 0 hours 40 minutes 32 seconds  Total Procedure Duration: 0 hours 57 minutes 18 seconds  Findings:      Hemorrhoids were found on perianal exam.      The ascending colon revealed significantly excessive looping. The scope       was advanced with the use of pressure in the RLQ.      Two flat polyps were found in the cecum. The polyps were 4 to 8 mm in       size. Area was successfully injected with 3 mL Eleview for a lift       polypectomy. Imaging was performed using white light and narrow band       imaging to visualize the mucosa and demarcate the polyp site after       injection for EMR purposes. These polyps were removed with a cold snare.       Resection and retrieval were complete. To prevent bleeding after the       polypectomy, one hemostatic clip was successfully placed. There was no       bleeding at the end of the procedure.      Two sessile polyps were found in the ascending colon. The polyps were 4       to 6 mm in size. These polyps were removed with a cold snare. Resection       and retrieval were complete.  A 2 mm polyp was found in the ascending colon. The polyp was sessile.       The polyp was removed with a cold biopsy forceps. Resection and       retrieval were complete.      A 2 mm polyp was found in the transverse colon. The polyp was sessile.       The polyp was removed with a cold biopsy forceps. Resection and       retrieval were complete.      A few small-mouthed diverticula were found in the sigmoid  colon and       ascending colon.      A single (solitary) four mm ulcer was found in the rectum. This was       located at area of previous banding. No bleeding was present.      No additional abnormalities were found on retroflexion. Impression:               - Hemorrhoids found on perianal exam.                           - There was significant looping of the colon.                           - Two 4 to 8 mm polyps in the cecum, removed with a                            cold snare. Resected and retrieved. Injected. Clip                            was placed.                           - Two 4 to 6 mm polyps in the ascending colon,                            removed with a cold snare. Resected and retrieved.                           - One 2 mm polyp in the ascending colon, removed                            with a cold biopsy forceps. Resected and retrieved.                           - One 2 mm polyp in the transverse colon, removed                            with a cold biopsy forceps. Resected and retrieved.                           - Diverticulosis in the sigmoid colon and in the                            ascending colon.                           -  A single (solitary) ulcer in the rectum. Moderate Sedation:      Per Anesthesia Care Recommendation:           - Discharge patient to home (ambulatory).                           - Resume previous diet.                           - Await pathology results.                           - Repeat colonoscopy for surveillance based on                            pathology results.                           - Start taking Movantik 12.5 mg qday. Procedure Code(s):        --- Professional ---                           [phone], Colonoscopy, flexible; with removal of                            tumor(s), polyp(s), or other lesion(s) by snare                            technique                           45381, Colonoscopy, flexible; with directed                             submucosal injection(s), any substance                           25956, 59, Colonoscopy, flexible; with biopsy,                            single or multiple Diagnosis Code(s):        --- Professional ---                           K63.5, Polyp of colon                           Z86.010, Personal history of colonic polyps                           K64.9, Unspecified hemorrhoids                           K62.6, Ulcer of anus and rectum                           K57.30, Diverticulosis of large intestine without  perforation or abscess without bleeding CPT copyright 2019 [employer]. All rights reserved. The codes documented in this report are preliminary and upon coder review may  be revised to meet current compliance requirements. Maylon Peppers, MD Maylon Peppers,  07/17/2020 10:33:20 AM This report has been signed electronically. Number of Addenda: 0

## 2020-07-17 NOTE — Transfer of Care (Signed)
Immediate Anesthesia Transfer of Care Note  Patient: [name]  Procedure(s) Performed: COLONOSCOPY WITH PROPOFOL POLYPECTOMY HEMOSTASIS CLIP PLACEMENT BIOPSY  Patient Location: Endoscopy Unit  Anesthesia Type:General  Level of Consciousness: awake  Airway & Oxygen Therapy: Patient Spontanous Breathing  Post-op Assessment: Report given to RN  Post vital signs: Reviewed  Last Vitals:  Vitals Value Taken Time  BP    Temp    Pulse    Resp    SpO2      Last Pain:  Vitals:   07/17/20 0926  TempSrc:   PainSc: 0-No pain      Patients Stated Pain Goal: 7 (96/28/36 6294)  Complications: No notable events documented.

## 2020-07-17 NOTE — Discharge Instructions (Addendum)
You are being discharged to home.  Resume your previous diet.  We are waiting for your pathology results.  Your physician has recommended a repeat colonoscopy for surveillance based on pathology results.  Start taking Movantik 12.5 mg qday.

## 2020-07-17 NOTE — Anesthesia Postprocedure Evaluation (Signed)
Anesthesia Post Note  Patient: [name]  Procedure(s) Performed: COLONOSCOPY WITH PROPOFOL POLYPECTOMY HEMOSTASIS CLIP PLACEMENT BIOPSY  Patient location during evaluation: Endoscopy Anesthesia Type: General Level of consciousness: awake and alert Pain management: pain level controlled Vital Signs Assessment: post-procedure vital signs reviewed and stable Respiratory status: spontaneous breathing Cardiovascular status: blood pressure returned to baseline and stable Postop Assessment: no apparent nausea or vomiting Anesthetic complications: no   No notable events documented.   Last Vitals:  Vitals:   07/17/20 0750  BP: (!) 150/93  Resp: 15  Temp: 36.8 C  SpO2: 96%    Last Pain:  Vitals:   07/17/20 0926  TempSrc:   PainSc: 0-No pain                 [name]

## 2020-07-17 NOTE — Anesthesia Preprocedure Evaluation (Signed)
Anesthesia Evaluation  Patient identified by MRN, date of birth, ID band Patient awake    Reviewed: Allergy & Precautions, H&P , NPO status , Patient's Chart, lab work & pertinent test results, reviewed documented beta blocker date and time   Airway Mallampati: II  TM Distance: >3 FB Neck ROM: full    Dental no notable dental hx.    Pulmonary sleep apnea , COPD, former smoker,    Pulmonary exam normal breath sounds clear to auscultation       Cardiovascular Exercise Tolerance: Good hypertension, + angina + CAD and + Cardiac Stents   Rhythm:regular Rate:Normal     Neuro/Psych  Headaches, negative psych ROS   GI/Hepatic Neg liver ROS, GERD  Medicated,  Endo/Other  Hypothyroidism   Renal/GU negative Renal ROS  negative genitourinary   Musculoskeletal   Abdominal   Peds  Hematology negative hematology ROS (+)   Anesthesia Other Findings   Reproductive/Obstetrics negative OB ROS                             Anesthesia Physical Anesthesia Plan  ASA: 3  Anesthesia Plan: General   Post-op Pain Management:    Induction:   PONV Risk Score and Plan: Propofol infusion  Airway Management Planned:   Additional Equipment:   Intra-op Plan:   Post-operative Plan:   Informed Consent: I have reviewed the patients History and Physical, chart, labs and discussed the procedure including the risks, benefits and alternatives for the proposed anesthesia with the patient or authorized representative who has indicated his/her understanding and acceptance.     Dental Advisory Given  Plan Discussed with: CRNA  Anesthesia Plan Comments:         Anesthesia Quick Evaluation

## 2020-07-20 ENCOUNTER — Other Ambulatory Visit (INDEPENDENT_AMBULATORY_CARE_PROVIDER_SITE_OTHER): Payer: Self-pay

## 2020-07-20 DIAGNOSIS — K5903 Drug induced constipation: Secondary | ICD-10-CM

## 2020-07-20 LAB — SURGICAL PATHOLOGY

## 2020-07-20 MED ORDER — NALOXEGOL OXALATE 12.5 MG PO TABS: 12.5000 mg | ORAL_TABLET | Freq: Every day | ORAL | 3 refills | Status: AC

## 2020-07-21 ENCOUNTER — Encounter (INDEPENDENT_AMBULATORY_CARE_PROVIDER_SITE_OTHER): Payer: Self-pay | Admitting: *Deleted

## 2020-07-24 ENCOUNTER — Encounter (HOSPITAL_COMMUNITY): Payer: Self-pay | Admitting: Gastroenterology

## 2020-08-21 ENCOUNTER — Other Ambulatory Visit: Payer: Self-pay | Admitting: Family Medicine

## 2020-08-21 DIAGNOSIS — M4712 Other spondylosis with myelopathy, cervical region: Secondary | ICD-10-CM

## 2020-08-21 DIAGNOSIS — Z0289 Encounter for other administrative examinations: Secondary | ICD-10-CM

## 2020-08-21 DIAGNOSIS — G8929 Other chronic pain: Secondary | ICD-10-CM

## 2020-09-15 ENCOUNTER — Other Ambulatory Visit: Payer: Self-pay

## 2020-09-15 ENCOUNTER — Encounter: Payer: Self-pay | Admitting: Physician Assistant

## 2020-09-15 ENCOUNTER — Ambulatory Visit: Payer: Medicare Other | Admitting: Physician Assistant

## 2020-09-15 VITALS — BP 132/72 | HR 72 | Resp 20 | Ht 72.0 in | Wt 222.6 lb

## 2020-09-15 DIAGNOSIS — E039 Hypothyroidism, unspecified: Secondary | ICD-10-CM | POA: Diagnosis not present

## 2020-09-15 DIAGNOSIS — I2510 Atherosclerotic heart disease of native coronary artery without angina pectoris: Secondary | ICD-10-CM

## 2020-09-15 DIAGNOSIS — I10 Essential (primary) hypertension: Secondary | ICD-10-CM | POA: Diagnosis not present

## 2020-09-15 DIAGNOSIS — E785 Hyperlipidemia, unspecified: Secondary | ICD-10-CM | POA: Diagnosis not present

## 2020-09-15 NOTE — Progress Notes (Signed)
Cardiology Office Note:    Date:  09/15/2020   ID:  Atzin, Onken 1945/07/28, MRN [id number]  PCP:  Dettinger, Fransisca Kaufmann, MD   [facility] Providers Cardiologist:  Peter Martinique, MD     Referring MD: Dettinger, Fransisca Kaufmann, MD   Chief Complaint  Patient presents with   Follow-up    Annual follow up, seen for Dr. Martinique     History of Present Illness:    [name] is a 75 y.o. male with a hx of CAD, hyperlipidemia, hypothyroidism, hypertension and history of obstructive sleep apnea.  He underwent stenting of OM 3 with bare-metal stent in 2001.  Repeat cardiac catheterization in 2003 revealed no significant obstructive disease.  He underwent stenting of the proximal left circumflex with Taxus stent in 2007.  Cardiac catheterization in 2010 demonstrated 70% disease in RCA, patent stent, he was treated medically.  Myoview in November 2014 still showed a small lateral fixed defect consistent with scar or artifact, no ischemia, EF 59%.  Last cardiac catheterization in May 2016 showed nonobstructive disease, patent stent.  Last echocardiogram obtained on 07/26/2018 showed EF 56%, large defect of moderate severity present in the basal inferoseptal, basal inferior, mid inferoseptal, mid inferior, apical septal, and apical inferior location, suspect attenuation artifact, no reversible ischemia.  Overall low risk study.  He follows Dr. Lake Bells for COPD and emphysema.  He was last seen by Dr. Martinique in August 2021 at which time he was doing well.  TSH went up to 34 in March 2022, since then, TSH has came down to 8.  Hypothyroidism is managed by PCP.  Patient presents today for cardiology follow-up.  He denies any recent exertional chest pain or worsening dyspnea.  He has no lower extremity edema, orthopnea or PND.  He has no significant heart murmur on physical exam.  Blood pressure is fairly controlled on lisinopril and amlodipine.  He is also on aspirin and Zocor.  Most recent blood work  obtained in June 2022 by his PCP showed well-controlled LDL, HDL, and total cholesterol, triglyceride is borderline high however improved compared to last year.  Overall, I think he is doing well and can follow-up in 1 year.  Past Medical History:  Diagnosis Date   Anginal pain ([place])    Arthritis    OSTEO BACK KNEES HIPS & HANDS   Arthropathy, unspecified, site unspecified    Chronic airway obstruction, not elsewhere classified    Coronary atherosclerosis of unspecified type of vessel, native or graft    a. [place] 5/16:  mLAD 20%, CFX stent ok, OM3 stent ok, mRCA 30%, normal LVF   Headache(784.0)    Hx of echocardiogram    a. Echo 5/16:  mild LVH, EF 60%, mildly dilated Ao root (40 mm)   Hypothyroidism    Other and unspecified hyperlipidemia    Thyroid disease    hypothyroid   Unspecified essential hypertension    Unspecified sleep apnea    [facility]. Jacelyn Grip at [facility] told pt he had sleep apnea, but never sent him for a sleep study so he doesn't wear a CPAP    Past Surgical History:  Procedure Laterality Date   ANKLE SURGERY     BALLOON DILATION N/A 05/02/2013   Procedure: BALLOON DILATION;  Surgeon: Rogene Houston, MD;  [facility];  Service: Endoscopy;  Laterality: N/A;   BIOPSY  07/17/2020   Procedure: BIOPSY;  Surgeon: Harvel Quale, MD;  [facility];  Service: Gastroenterology;;   CARDIAC CATHETERIZATION  01/15/2013   CARDIAC CATHETERIZATION N/A 05/16/2014   Procedure: Left Heart Cath and Coronary Angiography;  Surgeon: Peter M Martinique, MD;  Location: [place] [facility];  Service: Cardiovascular;  Laterality: N/A;   COLONOSCOPY N/A 05/02/2013   Procedure: COLONOSCOPY;  Surgeon: Rogene Houston, MD;  [facility];  Service: Endoscopy;  Laterality: N/A;  200-moved to 1200 Ann notified pt   COLONOSCOPY WITH PROPOFOL N/A 05/08/2020   Procedure: COLONOSCOPY WITH PROPOFOL;  Surgeon: Harvel Quale, MD;  [facility];   Service: Gastroenterology;  Laterality: N/A;  AM   COLONOSCOPY WITH PROPOFOL N/A 06/19/2020   Procedure: COLONOSCOPY WITH PROPOFOL;  Surgeon: Harvel Quale, MD;  [facility];  Service: Gastroenterology;  Laterality: [id number]   COLONOSCOPY WITH PROPOFOL N/A 07/17/2020   Procedure: COLONOSCOPY WITH PROPOFOL;  Surgeon: Harvel Quale, MD;  [facility];  Service: Gastroenterology;  Laterality: N/A;  9:00   CORONARY STENT PLACEMENT     2003, 2004   ESOPHAGOGASTRODUODENOSCOPY N/A 05/02/2013   Procedure: ESOPHAGOGASTRODUODENOSCOPY (EGD);  Surgeon: Rogene Houston, MD;  [facility];  Service: Endoscopy;  Laterality: N/A;   HEMORRHOID SURGERY     HEMOSTASIS CLIP PLACEMENT  07/17/2020   Procedure: HEMOSTASIS CLIP PLACEMENT;  Surgeon: Harvel Quale, MD;  [facility];  Service: Gastroenterology;;   HIP ARTHROPLASTY     KNEE SURGERY Left 12/2013   LEFT HEART CATHETERIZATION WITH CORONARY ANGIOGRAM N/A 01/15/2013   Procedure: LEFT HEART CATHETERIZATION WITH CORONARY ANGIOGRAM;  Surgeon: Sinclair Grooms, MD;  Location: [facility] CATH LAB;  Service: Cardiovascular;  Laterality: N/A;   MALONEY DILATION N/A 05/02/2013   Procedure: Venia Minks DILATION;  Surgeon: Rogene Houston, MD;  [facility];  Service: Endoscopy;  Laterality: N/A;   POLYPECTOMY  07/17/2020   Procedure: POLYPECTOMY;  Surgeon: Harvel Quale, MD;  [facility];  Service: Gastroenterology;;   [name] DILATION N/A 05/02/2013   Procedure: [name] DILATION;  Surgeon: Rogene Houston, MD;  [facility];  Service: Endoscopy;  Laterality: N/A;   TONSILLECTOMY      Current Medications: Current Meds  Medication Sig   albuterol (PROAIR HFA) 108 (90 BASE) MCG/ACT inhaler Inhale 2 puffs into the lungs every 6 (six) hours as needed for wheezing or shortness of breath.   amLODipine (NORVASC) 10 MG tablet Take 1 tablet (10 mg total) by  mouth daily.   aspirin EC 81 MG tablet Take 1 tablet (81 mg total) by mouth daily.   Cyanocobalamin (VITAMIN B-12 PO) Take 2,500 mcg by mouth daily.   levothyroxine (SYNTHROID) 175 MCG tablet Take 1 tablet (175 mcg total) by mouth daily.   lisinopril (ZESTRIL) 20 MG tablet Take 1 tablet (20 mg total) by mouth daily.   Multiple Vitamin (MULTIVITAMIN WITH MINERALS) TABS tablet Take 1 tablet by mouth daily.   naloxegol oxalate (MOVANTIK) 12.5 MG TABS tablet Take 1 tablet (12.5 mg total) by mouth daily.   [START ON 09/20/2020] oxyCODONE-acetaminophen (PERCOCET) 7.5-325 MG tablet Take 1 tablet by mouth every 6 (six) hours as needed for severe pain.   oxyCODONE-acetaminophen (PERCOCET) 7.5-325 MG tablet TAKE ONE TABLET EVERY 6 HOURS AS NEEDED FOR SEVERE PAIN   sertraline (ZOLOFT) 50 MG tablet Take 1 tablet (50 mg total) by mouth daily.   sildenafil (REVATIO) 20 MG tablet Take 1-3 tablets (20-60 mg total) by mouth as needed. (Patient taking  differently: Take 20-60 mg by mouth as needed (ed).)   simvastatin (ZOCOR) 40 MG tablet Take 1 tablet (40 mg total) by mouth at bedtime.     Allergies:   Patient has no known allergies.   Social History   Socioeconomic History   Marital status: Widowed    Spouse name: Not on file   Number of children: 2   Years of education: Not on file   Highest education level: 11th grade  Occupational History   Occupation: Company secretary: Journalist, newspaper   Occupation: Retired   Tobacco Use   Smoking status: Former    Packs/day: 3.00    Years: 20.00    Pack years: 60.00    Types: Cigarettes    Quit date: 05/26/1975    Years since quitting: 45.3   Smokeless tobacco: Current    Types: Chew  Vaping Use   Vaping Use: Never used  Substance and Sexual Activity   Alcohol use: No   Drug use: No   Sexual activity: Not Currently  Other Topics Concern   Not on file  Social History Narrative   Occupation Engineer, maintenance (IT), 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 Grandson lives with him.   Social Determinants of Health   Financial Resource Strain: Low Risk    Difficulty of Paying Living Expenses: Not very hard  Food Insecurity: No Food Insecurity   Worried About Charity fundraiser in the Last Year: Never true   Ran Out of Food in the Last Year: Never true  Transportation Needs: No Transportation Needs   Lack of Transportation (Medical): No   Lack of Transportation (Non-Medical): No  Physical Activity: Insufficiently Active   Days of Exercise per Week: 3 days   Minutes of Exercise per Session: 30 min  Stress: No Stress Concern Present   Feeling of Stress : Not at all  Social Connections: Moderately Integrated   Frequency of Communication with Friends and Family: More than three times a week   Frequency of Social Gatherings with Friends and Family: More than three times a week   Attends Religious Services: More than 4 times per year   Active Member of [employer] or Organizations: Yes   Attends Archivist Meetings: More than 4 times per year   Marital Status: Widowed     Family History: The patient's family history includes CAD in his father; Stroke in his mother. There is no history of Colon cancer.  ROS:   Please see the history of present illness.     All other systems reviewed and are negative.  EKGs/Labs/Other Studies Reviewed:    The following studies were reviewed today:  Myoview 07/26/2018 The left ventricular ejection fraction is normal (55-65%). Nuclear stress EF: 56%. No T wave inversion was noted during stress. There was no ST segment deviation noted during stress. Defect 1: There is a large defect of moderate severity present in the basal inferoseptal, basal inferior, mid inferoseptal, mid inferior, apical septal and apical inferior location. This is a low risk study.   Large size, moderate severity mostly fixed inferoseptal perfusion defect (SDS 2), suspect attenuation artifact. No  reversible ischemia. LVEF 56% with normal wall motion. This is a low risk study.  EKG:  EKG is ordered today.  The ekg ordered today demonstrates normal sinus rhythm, poor R wave progression in the anterior leads, unchanged when compared to the previous EKG from 2021  Recent Labs: 06/22/2020: ALT  10; BUN 14; Creatinine, Ser 0.96; Hemoglobin 11.9; Platelets 264; Potassium 4.7; Sodium 138; TSH 8.140  Recent Lipid Panel    Component Value Date/Time   CHOL 137 06/22/2020 0917   CHOL 160 04/16/2012 1647   TRIG 164 (H) 06/22/2020 0917   TRIG 250 (H) 04/04/2013 0952   TRIG 165 (H) 04/16/2012 1647   HDL 47 06/22/2020 0917   HDL 42 04/04/2013 0952   HDL 51 04/16/2012 1647   CHOLHDL 2.9 06/22/2020 0917   CHOLHDL 3.0 05/16/2014 0115   VLDL 16 05/16/2014 0115   LDLCALC 62 06/22/2020 0917   LDLCALC 53 04/04/2013 0952   LDLCALC 76 04/16/2012 1647   LDLDIRECT 70.0 04/12/2006 1050     Risk Assessment/Calculations:           Physical Exam:    VS:  BP 132/72 (BP Location: Left Arm, Patient Position: Sitting, Cuff Size: Normal)   Pulse 72   Resp 20   Ht 6' (1.829 m)   Wt 222 lb 9.6 oz (101 kg)   SpO2 96%   BMI 30.19 kg/m     Wt Readings from Last 3 Encounters:  09/15/20 222 lb 9.6 oz (101 kg)  07/17/20 209 lb (94.8 kg)  06/24/20 218 lb 6.4 oz (99.1 kg)     GEN:  Well nourished, well developed in no acute distress HEENT: Normal NECK: No JVD; No carotid bruits LYMPHATICS: No lymphadenopathy CARDIAC: RRR, no murmurs, rubs, gallops RESPIRATORY:  Clear to auscultation without rales, wheezing or rhonchi  ABDOMEN: Soft, non-tender, non-distended MUSCULOSKELETAL:  No edema; No deformity  SKIN: Warm and dry NEUROLOGIC:  Alert and oriented x 3 PSYCHIATRIC:  Normal affect   ASSESSMENT:    1. Coronary artery disease involving native coronary artery of native heart without angina pectoris   2. Essential hypertension   3. Hyperlipidemia LDL goal <70   4. Hypothyroidism, unspecified  type    PLAN:    In order of problems listed above:  CAD: Denies any recent chest pain.  Continue on aspirin and a statin  Hypertension: Blood pressure well controlled  Hyperlipidemia: On Zocor 40 mg daily.  Recent blood work obtained in June 2022 showed well-controlled LDL, HDL and total cholesterol.  Hypothyroidism: On levothyroxine.  Managed by primary care provider.        Medication Adjustments/Labs and Tests Ordered: Current medicines are reviewed at length with the patient today.  Concerns regarding medicines are outlined above.  No orders of the defined types were placed in this encounter.  No orders of the defined types were placed in this encounter.   There are no Patient Instructions on file for this visit.   Hilbert Corrigan, [place]  09/15/2020 8:30 AM     [facility]

## 2020-09-15 NOTE — Patient Instructions (Signed)
Medication Instructions:  Your physician recommends that you continue on your current medications as directed. Please refer to the Current Medication list given to you today.  *If you need a refill on your cardiac medications before your next appointment, please call your pharmacy*  Lab Work: NONE ordered at this time of appointment   If you have labs (blood work) drawn today and your tests are completely normal, you will receive your results only by: [place] (if you have MyChart) OR A paper copy in the mail If you have any lab test that is abnormal or we need to change your treatment, we will call you to review the results.  Testing/Procedures: NONE ordered at this time of appointment   Follow-Up: At [facility], you and your health needs are our priority.  As part of our continuing mission to provide you with exceptional heart care, we have created designated Provider Care Teams.  These Care Teams include your primary Cardiologist (physician) and Advanced Practice Providers (APPs -  Physician Assistants and Nurse Practitioners) who all work together to provide you with the care you need, when you need it.   Your next appointment:   1 year(s)  The format for your next appointment:   In Person  Provider:   Peter Martinique, MD  Other Instructions

## 2020-09-18 ENCOUNTER — Encounter: Payer: Self-pay | Admitting: Family Medicine

## 2020-09-18 ENCOUNTER — Ambulatory Visit (INDEPENDENT_AMBULATORY_CARE_PROVIDER_SITE_OTHER): Payer: Medicare Other | Admitting: Family Medicine

## 2020-09-18 ENCOUNTER — Other Ambulatory Visit: Payer: Self-pay

## 2020-09-18 VITALS — BP 136/69 | HR 62 | Temp 97.2°F | Ht 72.0 in | Wt 221.0 lb

## 2020-09-18 DIAGNOSIS — I2510 Atherosclerotic heart disease of native coronary artery without angina pectoris: Secondary | ICD-10-CM | POA: Diagnosis not present

## 2020-09-18 DIAGNOSIS — E079 Disorder of thyroid, unspecified: Secondary | ICD-10-CM | POA: Diagnosis not present

## 2020-09-18 DIAGNOSIS — I10 Essential (primary) hypertension: Secondary | ICD-10-CM | POA: Diagnosis not present

## 2020-09-18 DIAGNOSIS — Z0289 Encounter for other administrative examinations: Secondary | ICD-10-CM

## 2020-09-18 DIAGNOSIS — G8929 Other chronic pain: Secondary | ICD-10-CM

## 2020-09-18 DIAGNOSIS — N522 Drug-induced erectile dysfunction: Secondary | ICD-10-CM

## 2020-09-18 DIAGNOSIS — J449 Chronic obstructive pulmonary disease, unspecified: Secondary | ICD-10-CM

## 2020-09-18 DIAGNOSIS — E782 Mixed hyperlipidemia: Secondary | ICD-10-CM

## 2020-09-18 DIAGNOSIS — M4712 Other spondylosis with myelopathy, cervical region: Secondary | ICD-10-CM | POA: Diagnosis not present

## 2020-09-18 MED ORDER — OXYCODONE-ACETAMINOPHEN 7.5-325 MG PO TABS: 1.0000 | ORAL_TABLET | Freq: Four times a day (QID) | ORAL | 0 refills | Status: DC | PRN

## 2020-09-18 NOTE — Progress Notes (Signed)
BP 136/69   Pulse 62   Temp (!) 97.2 F (36.2 C) (Temporal)   Ht 6' (1.829 m)   Wt 221 lb (100.2 kg)   BMI 29.97 kg/m    Subjective:   Patient ID: [name], male    DOB: 07-Jun-1945, 75 y.o.   MRN: [id number]  HPI: [name] is a 75 y.o. male presenting on 09/18/2020 for Medical Management of Chronic Issues   HPI Pain assessment: Cause of pain-osteoarthritis cervical spine Pain location-neck and back Pain on scale of 1-10- 3 Frequency-Daily What increases pain-weather and certain movements What makes pain Better-oxycodone Effects on ADL -minimal, is able to get up and do things he needs to do Any change in general medical condition-none  Current opioids rx-oxycodone-acetaminophen 7.5-325 every 6 hours as needed # meds rx-120 Effectiveness of current meds-works well Adverse reactions from pain meds-none Morphine equivalent-45  Pill count performed-No Last drug screen -03/27/2020 ( high risk q9m moderate risk q659mlow risk yearly ) Urine drug screen today- No Was the [place]-yes  If yes were their any concerning findings? -None   Overdose risk: 140 Opioid Risk  03/27/2019  Alcohol 0  Illegal Drugs 0  Rx Drugs 0  Alcohol 0  Illegal Drugs 0  Rx Drugs 0  Age between 16-45 years  0  History of Preadolescent Sexual Abuse 0  Psychological Disease 0  Depression 1  Opioid Risk Tool Scoring 1  Opioid Risk Interpretation Low Risk     Pain contract signed on: 03/27/2020  Patient complains of erectile dysfunction and has tried both Cialis and Viagra and that did not help.  He would like to take the next up and go see urologist.  Hypertension Patient is currently on amlodipine and lisinopril, and their blood pressure today is 136/69. Patient denies any lightheadedness or dizziness. Patient denies headaches, blurred vision, chest pains, shortness of breath, or weakness. Denies any side effects from medication and is content with current medication.    Hypothyroidism recheck Patient is coming in for thyroid recheck today as well. They deny any issues with hair changes or heat or cold problems or diarrhea or constipation. They deny any chest pain or palpitations. They are currently on levothyroxine 175 micrograms   Hyperlipidemia and CAD Patient is coming in for recheck of his hyperlipidemia, patient sees cardiology. The patient is currently taking simvastatin. They deny any issues with myalgias or history of liver damage from it. They deny any focal numbness or weakness or chest pain.   COPD Patient is coming in for COPD recheck today.  He is currently on albuterol.  He has a mild chronic cough but denies any major coughing spells or wheezing spells.  He has 0nighttime symptoms per week and 0daytime symptoms per week currently.   Relevant past medical, surgical, family and social history reviewed and updated as indicated. Interim medical history since our last visit reviewed. Allergies and medications reviewed and updated.  Review of Systems  Constitutional:  Negative for chills and fever.  Eyes:  Negative for visual disturbance.  Respiratory:  Negative for shortness of breath and wheezing.   Cardiovascular:  Negative for chest pain and leg swelling.  Musculoskeletal:  Negative for back pain and gait problem.  Skin:  Negative for rash.  Neurological:  Negative for dizziness, weakness and numbness.  All other systems reviewed and are negative.  Per HPI unless specifically indicated above   Allergies as of 09/18/2020   No Known Allergies  Medication List        Accurate as of September 18, 2020  8:30 AM. If you have any questions, ask your nurse or doctor.          albuterol 108 (90 Base) MCG/ACT inhaler Commonly known as: ProAir HFA Inhale 2 puffs into the lungs every 6 (six) hours as needed for wheezing or shortness of breath.   amLODipine 10 MG tablet Commonly known as: NORVASC Take 1 tablet (10 mg total) by  mouth daily.   aspirin EC 81 MG tablet Take 1 tablet (81 mg total) by mouth daily.   levothyroxine 175 MCG tablet Commonly known as: SYNTHROID Take 1 tablet (175 mcg total) by mouth daily.   lisinopril 20 MG tablet Commonly known as: ZESTRIL Take 1 tablet (20 mg total) by mouth daily.   multivitamin with minerals Tabs tablet Take 1 tablet by mouth daily.   naloxegol oxalate 12.5 MG Tabs tablet Commonly known as: Movantik Take 1 tablet (12.5 mg total) by mouth daily.   oxyCODONE-acetaminophen 7.5-325 MG tablet Commonly known as: Percocet Take 1 tablet by mouth every 6 (six) hours as needed for severe pain. Start taking on: September 20, 2020 What changed: These instructions start on September 20, 2020. If you are unsure what to do until then, ask your doctor or other care provider. Changed by: Worthy Rancher, MD   oxyCODONE-acetaminophen 7.5-325 MG tablet Commonly known as: PERCOCET Take 1 tablet by mouth every 6 (six) hours as needed for severe pain. Start taking on: October 18, 2020 What changed:  See the new instructions. These instructions start on October 18, 2020. If you are unsure what to do until then, ask your doctor or other care provider. Changed by: Worthy Rancher, MD   oxyCODONE-acetaminophen 7.5-325 MG tablet Commonly known as: PERCOCET Take 1 tablet by mouth every 6 (six) hours as needed for severe pain. Start taking on: November 18, 2020 What changed: You were already taking a medication with the same name, and this prescription was added. Make sure you understand how and when to take each. Changed by: Fransisca Kaufmann Latoy Labriola, MD   sertraline 50 MG tablet Commonly known as: Zoloft Take 1 tablet (50 mg total) by mouth daily.   sildenafil 20 MG tablet Commonly known as: REVATIO Take 1-3 tablets (20-60 mg total) by mouth as needed. What changed: reasons to take this   simvastatin 40 MG tablet Commonly known as: ZOCOR Take 1 tablet (40 mg total) by  mouth at bedtime.   VITAMIN B-12 PO Take 2,500 mcg by mouth daily.         Objective:   BP 136/69   Pulse 62   Temp (!) 97.2 F (36.2 C) (Temporal)   Ht 6' (1.829 m)   Wt 221 lb (100.2 kg)   BMI 29.97 kg/m   Wt Readings from Last 3 Encounters:  09/18/20 221 lb (100.2 kg)  09/15/20 222 lb 9.6 oz (101 kg)  07/17/20 209 lb (94.8 kg)    Physical Exam Vitals and nursing note reviewed.  Constitutional:      General: He is not in acute distress.    Appearance: He is well-developed. He is not diaphoretic.  Eyes:     General: No scleral icterus.    Conjunctiva/sclera: Conjunctivae normal.  Neck:     Thyroid: No thyromegaly.  Cardiovascular:     Rate and Rhythm: Normal rate and regular rhythm.     Heart sounds: Normal heart sounds. No murmur heard. Pulmonary:  Effort: Pulmonary effort is normal. No respiratory distress.     Breath sounds: Normal breath sounds. No wheezing.  Musculoskeletal:        General: Normal range of motion.  Skin:    General: Skin is warm and dry.     Findings: No rash.  Neurological:     Mental Status: He is alert and oriented to person, place, and time.     Coordination: Coordination normal.  Psychiatric:        Behavior: Behavior normal.      Assessment & Plan:   Problem List Items Addressed This Visit       Cardiovascular and Mediastinum   CAD (coronary artery disease) (Chronic)   Relevant Orders   CBC with Differential/Platelet   Essential hypertension     Respiratory   COPD (chronic obstructive pulmonary disease) (HCC) (Chronic)   Relevant Orders   CBC with Differential/Platelet     Endocrine   Thyroid disease - Primary   Relevant Orders   Thyroid Panel With TSH     Other   Hyperlipidemia   Pain management contract signed   Relevant Medications   oxyCODONE-acetaminophen (PERCOCET) 7.5-325 MG tablet (Start on 10/18/2020)   oxyCODONE-acetaminophen (PERCOCET) 7.5-325 MG tablet (Start on 09/20/2020)    oxyCODONE-acetaminophen (PERCOCET) 7.5-325 MG tablet (Start on 11/18/2020)   Other Visit Diagnoses     Encounter for chronic pain management       Relevant Medications   oxyCODONE-acetaminophen (PERCOCET) 7.5-325 MG tablet (Start on 10/18/2020)   oxyCODONE-acetaminophen (PERCOCET) 7.5-325 MG tablet (Start on 09/20/2020)   oxyCODONE-acetaminophen (PERCOCET) 7.5-325 MG tablet (Start on 11/18/2020)   Osteoarthritis of cervical spine with myelopathy       Relevant Medications   oxyCODONE-acetaminophen (PERCOCET) 7.5-325 MG tablet (Start on 10/18/2020)   oxyCODONE-acetaminophen (PERCOCET) 7.5-325 MG tablet (Start on 09/20/2020)   oxyCODONE-acetaminophen (PERCOCET) 7.5-325 MG tablet (Start on 11/18/2020)   Drug-induced erectile dysfunction       Relevant Orders   Ambulatory referral to Urology       Continue current medicine, will recheck thyroid and blood counts because they were slightly off last visit.  Refer to urology because of erectile dysfunction that felt both Cialis and Viagra. Follow up plan: Return in about 3 months (around 12/18/2020), or if symptoms worsen or fail to improve, for Chronic pain management and hypertension and thyroid.  Counseling provided for all of the vaccine components Orders Placed This Encounter  Procedures   Thyroid Panel With TSH   CBC with Differential/Platelet   Ambulatory referral to Urology    Caryl Pina, MD [facility] 09/18/2020, 8:30 AM

## 2020-09-19 LAB — CBC WITH DIFFERENTIAL/PLATELET
Basophils Absolute: 0 10*3/uL (ref 0.0–0.2)
Basos: 1 %
EOS (ABSOLUTE): 0.1 10*3/uL (ref 0.0–0.4)
Eos: 2 %
Hematocrit: 40.4 % (ref 37.5–51.0)
Hemoglobin: 12.6 g/dL — ABNORMAL LOW (ref 13.0–17.7)
Immature Grans (Abs): 0 10*3/uL (ref 0.0–0.1)
Immature Granulocytes: 0 %
Lymphocytes Absolute: 1.6 10*3/uL (ref 0.7–3.1)
Lymphs: 24 %
MCH: 26.9 pg (ref 26.6–33.0)
MCHC: 31.2 g/dL — ABNORMAL LOW (ref 31.5–35.7)
MCV: 86 fL (ref 79–97)
Monocytes Absolute: 0.7 10*3/uL (ref 0.1–0.9)
Monocytes: 10 %
Neutrophils Absolute: 4.2 10*3/uL (ref 1.4–7.0)
Neutrophils: 63 %
Platelets: 323 10*3/uL (ref 150–450)
RBC: 4.69 x10E6/uL (ref 4.14–5.80)
RDW: 14.4 % (ref 11.6–15.4)
WBC: 6.7 10*3/uL (ref 3.4–10.8)

## 2020-09-19 LAB — THYROID PANEL WITH TSH
Free Thyroxine Index: 3 (ref 1.2–4.9)
T3 Uptake Ratio: 32 % (ref 24–39)
T4, Total: 9.3 ug/dL (ref 4.5–12.0)
TSH: 4.89 u[IU]/mL — ABNORMAL HIGH (ref 0.450–4.500)

## 2020-09-24 ENCOUNTER — Ambulatory Visit (INDEPENDENT_AMBULATORY_CARE_PROVIDER_SITE_OTHER): Payer: Medicare Other | Admitting: Urology

## 2020-09-24 ENCOUNTER — Other Ambulatory Visit: Payer: Self-pay

## 2020-09-24 ENCOUNTER — Encounter: Payer: Self-pay | Admitting: Urology

## 2020-09-24 VITALS — BP 162/86 | HR 79

## 2020-09-24 DIAGNOSIS — N529 Male erectile dysfunction, unspecified: Secondary | ICD-10-CM | POA: Diagnosis not present

## 2020-09-24 NOTE — Progress Notes (Signed)

## 2020-09-24 NOTE — Progress Notes (Signed)
Assessment: 1. Organic impotence      Plan: Today I had a long discussion with the patient spending a total of 30 minutes discussing ED.  I discussed the pathophysiology, etiology, and natural history of ED as well as management options using a goal-oriented approach.  We discussed the following options: medical therapy, VED, penile injections, intra-urethral suppositories, and penile prosthesis.  Risks and benefits of each method of therapy reviewed. Patient educational materials concerning these options were given to the patient.  He does have some interest in a VED.  Information provided.  He will contact the office if he wishes to proceed. Return to office prn  Chief Complaint:  Chief Complaint  Patient presents with   Erectile Dysfunction     History of Present Illness:  [name] is a 75 y.o. year old male who is seen in consultation from Dettinger, Fransisca Kaufmann, MD for evaluation of erectile dysfunction.  He reports symptoms for approximately 1 year.  His symptoms gradually worsened.  He is able to achieve a partial erection which is not satisfactory for intercourse.  He also reports difficulty maintaining his erection.  No pain or curvature.  No decreased libido.  He has tried sildenafil 100 mg without benefit.  He has also tried Cialis 20 mg without benefit. He does not have any lower urinary tract symptoms.   Past Medical History:  Past Medical History:  Diagnosis Date   Anginal pain ([place])    Arthritis    OSTEO BACK KNEES HIPS & HANDS   Arthropathy, unspecified, site unspecified    Chronic airway obstruction, not elsewhere classified    Coronary atherosclerosis of unspecified type of vessel, native or graft    a. [place] 5/16:  mLAD 20%, CFX stent ok, OM3 stent ok, mRCA 30%, normal LVF   Headache(784.0)    Hx of echocardiogram    a. Echo 5/16:  mild LVH, EF 60%, mildly dilated Ao root (40 mm)   Hypothyroidism    Other and unspecified hyperlipidemia    Thyroid disease     hypothyroid   Unspecified essential hypertension    Unspecified sleep apnea    Dr. Jacelyn Grip at [facility] told pt he had sleep apnea, but never sent him for a sleep study so he doesn't wear a CPAP    Past Surgical History:  Past Surgical History:  Procedure Laterality Date   ANKLE SURGERY     BALLOON DILATION N/A 05/02/2013   Procedure: BALLOON DILATION;  Surgeon: Rogene Houston, MD;  [facility];  Service: Endoscopy;  Laterality: N/A;   BIOPSY  07/17/2020   Procedure: BIOPSY;  Surgeon: Harvel Quale, MD;  [facility];  Service: Gastroenterology;;   CARDIAC CATHETERIZATION  01/15/2013   CARDIAC CATHETERIZATION N/A 05/16/2014   Procedure: Left Heart Cath and Coronary Angiography;  Surgeon: Peter M Martinique, MD;  Location: [facility];  Service: Cardiovascular;  Laterality: N/A;   COLONOSCOPY N/A 05/02/2013   Procedure: COLONOSCOPY;  Surgeon: Rogene Houston, MD;  [facility];  Service: Endoscopy;  Laterality: N/A;  200-moved to 1200 Ann notified pt   COLONOSCOPY WITH PROPOFOL N/A 05/08/2020   Procedure: COLONOSCOPY WITH PROPOFOL;  Surgeon: Harvel Quale, MD;  [facility];  Service: Gastroenterology;  Laterality: N/A;  AM   COLONOSCOPY WITH PROPOFOL N/A 06/19/2020   Procedure: COLONOSCOPY WITH PROPOFOL;  Surgeon: Harvel Quale, MD;  [facility];  Service: Gastroenterology;  Laterality: N/A;  [id number]   COLONOSCOPY WITH PROPOFOL N/A 07/17/2020   Procedure: COLONOSCOPY WITH PROPOFOL;  Surgeon: Harvel Quale, MD;  [facility];  Service: Gastroenterology;  Laterality: N/A;  9:00   CORONARY STENT PLACEMENT     2003, 2004   ESOPHAGOGASTRODUODENOSCOPY N/A 05/02/2013   Procedure: ESOPHAGOGASTRODUODENOSCOPY (EGD);  Surgeon: Rogene Houston, MD;  [facility];  Service: Endoscopy;  Laterality: N/A;   HEMORRHOID SURGERY     HEMOSTASIS CLIP PLACEMENT  07/17/2020    Procedure: HEMOSTASIS CLIP PLACEMENT;  Surgeon: Harvel Quale, MD;  [facility];  Service: Gastroenterology;;   HIP ARTHROPLASTY     KNEE SURGERY Left 12/2013   LEFT HEART CATHETERIZATION WITH CORONARY ANGIOGRAM N/A 01/15/2013   Procedure: LEFT HEART CATHETERIZATION WITH CORONARY ANGIOGRAM;  Surgeon: Sinclair Grooms, MD;  Location: [facility];  Service: Cardiovascular;  Laterality: N/A;   MALONEY DILATION N/A 05/02/2013   Procedure: Venia Minks DILATION;  Surgeon: Rogene Houston, MD;  [facility];  Service: Endoscopy;  Laterality: N/A;   POLYPECTOMY  07/17/2020   Procedure: POLYPECTOMY;  Surgeon: Harvel Quale, MD;  [facility];  Service: Gastroenterology;;   [name] DILATION N/A 05/02/2013   Procedure: [name] DILATION;  Surgeon: Rogene Houston, MD;  [facility];  Service: Endoscopy;  Laterality: N/A;   TONSILLECTOMY      Allergies:  No Known Allergies  Family History:  Family History  Problem Relation Age of Onset   Stroke Mother    CAD Father        MI at age 54   Colon cancer Neg Hx     Social History:  Social History   Tobacco Use   Smoking status: Former    Packs/day: 3.00    Years: 20.00    Pack years: 60.00    Types: Cigarettes    Quit date: 05/26/1975    Years since quitting: 45.3   Smokeless tobacco: Current    Types: Chew  Vaping Use   Vaping Use: Never used  Substance Use Topics   Alcohol use: No   Drug use: No    Review of symptoms:  Constitutional:  Negative for unexplained weight loss, night sweats, fever, chills ENT:  Negative for nose bleeds, sinus pain, painful swallowing CV:  Negative for chest pain, shortness of breath, exercise intolerance, palpitations, loss of consciousness Resp:  Negative for cough, wheezing, shortness of breath GI:  Negative for nausea, vomiting, diarrhea, bloody stools GU:  Positives noted in HPI; otherwise negative for gross hematuria, dysuria, urinary  incontinence Neuro:  Negative for seizures, poor balance, limb weakness, slurred speech Psych:  Negative for lack of energy, depression, anxiety Endocrine:  Negative for polydipsia, polyuria, symptoms of hypoglycemia (dizziness, hunger, sweating) Hematologic:  Negative for anemia, purpura, petechia, prolonged or excessive bleeding, use of anticoagulants  Allergic:  Negative for difficulty breathing or choking as a result of exposure to anything; no shellfish allergy; no allergic response (rash/itch) to materials, foods  Physical exam: BP (!) 162/86   Pulse 79  GENERAL APPEARANCE:  Well appearing, well developed, well nourished, NAD HEENT: Atraumatic, Normocephalic, oropharynx clear. NECK: Supple without lymphadenopathy or thyromegaly. LUNGS: Clear to auscultation bilaterally. HEART: Regular Rate and Rhythm without murmurs, gallops, or rubs. ABDOMEN: Soft, non-tender, No Masses. EXTREMITIES: Moves all extremities well.  Without clubbing, cyanosis, or edema. NEUROLOGIC:  Alert and oriented x 3, normal gait, CN II-XII grossly intact.  MENTAL STATUS:  Appropriate. BACK:  Non-tender to palpation.  No CVAT SKIN:  Warm, dry and intact.   GU: Penis:  no plaques or lesions Meatus: Normal Scrotum: normal, no masses Testis: normal without masses bilateral Epididymis: normal Prostate: 40 g, NT, no nodules Rectum: Normal tone, normal prostate, no masses or tenderness   Results: None

## 2020-09-25 ENCOUNTER — Encounter: Payer: Self-pay | Admitting: Urology

## 2020-09-28 ENCOUNTER — Telehealth: Payer: Self-pay

## 2020-09-28 NOTE — Telephone Encounter (Signed)
Patient called wishing to proceed with VED.

## 2020-09-29 NOTE — Telephone Encounter (Signed)
[employer] form faxed

## 2020-10-06 NOTE — Telephone Encounter (Signed)
Patient called inquiring about VED. Patient made aware that application has been faxed to the [facility] will reach out to patient. Patient was given number to call to check the status. Patient voiced understanding.

## 2020-10-20 ENCOUNTER — Other Ambulatory Visit: Payer: Self-pay

## 2020-10-20 ENCOUNTER — Encounter: Payer: Self-pay | Admitting: Gastroenterology

## 2020-10-20 ENCOUNTER — Ambulatory Visit: Payer: Medicare Other | Admitting: Gastroenterology

## 2020-10-20 VITALS — BP 152/74 | HR 64 | Temp 97.1°F | Ht 72.0 in | Wt 228.6 lb

## 2020-10-20 DIAGNOSIS — K641 Second degree hemorrhoids: Secondary | ICD-10-CM | POA: Diagnosis not present

## 2020-10-20 NOTE — Patient Instructions (Signed)
We will see you in follow-up for additional banding!  Continue to avoid straining, limit toilet time, and continue Movantik.  I enjoyed seeing you again today! As you know, I value our relationship and want to provide genuine, compassionate, and quality care. I welcome your feedback. If you receive a survey regarding your visit,  I greatly appreciate you taking time to fill this out. See you next time!  Annitta Needs, PhD, ANP-BC [facility]enter[facility]

## 2020-10-20 NOTE — Progress Notes (Signed)
[place] Banding Note:   [name] is a 75 y.o. male presenting today for consideration of hemorrhoid banding. Last colonoscopy July 2022 with multiple polyps by Dr. Jenetta Downer. He had already undergone left lateral banding in June 2022. He takes Movantik due to OIC. Will focus on right anterior at this appointment. He notes improvement with first banding.    The patient presents with symptomatic grade 2 hemorrhoids, unresponsive to maximal medical therapy, requesting rubber band ligation of his/her hemorrhoidal disease. All risks, benefits, and alternative forms of therapy were described and informed consent was obtained.   The decision was made to band the right anterior internal hemorrhoid, and the [place] was used to perform band ligation without complication. Digital anorectal examination was then performed to assure proper positioning of the band, and to adjust the banded tissue as required. The patient was discharged home without pain or other issues. Dietary and behavioral recommendations were given and (if necessary prescriptions were given), along with follow-up instructions. The patient will return in followup and possible additional banding as required.  No complications were encountered and the patient tolerated the procedure well.   Annitta Needs, PhD, ANP-BC [facility]

## 2020-11-13 ENCOUNTER — Encounter: Payer: Self-pay | Admitting: Family Medicine

## 2020-11-13 ENCOUNTER — Ambulatory Visit (INDEPENDENT_AMBULATORY_CARE_PROVIDER_SITE_OTHER): Payer: Medicare Other | Admitting: Family Medicine

## 2020-11-13 DIAGNOSIS — J069 Acute upper respiratory infection, unspecified: Secondary | ICD-10-CM | POA: Diagnosis not present

## 2020-11-13 NOTE — Progress Notes (Signed)
Virtual Visit via Telephone Note  I connected with [name] on 11/13/20 at 12:23 PM by telephone and verified that I am speaking with the correct person using two identifiers. [name] is currently located at home and his son is currently with him during this visit. The provider, Loman Brooklyn, FNP is located in their office at time of visit.  I discussed the limitations, risks, security and privacy concerns of performing an evaluation and management service by telephone and the availability of in person appointments. I also discussed with the patient that there may be a patient responsible charge related to this service. The patient expressed understanding and agreed to proceed.  Subjective: PCP: Dettinger, Fransisca Kaufmann, MD  Chief Complaint  Patient presents with   URI   Patient complains of cough and runny nose. Cough is productive of white sputum which is increased from his baseline. Denies any other respiratory symptoms including wheezing and shortness of breath. Onset of symptoms was 1 day ago, gradually worsening since that time. He is drinking plenty of fluids. Evaluation to date: none. Treatment to date:  Nyquil and Tylenol . He has a history of COPD. He does not smoke.    ROS: Per HPI  Current Outpatient Medications:    albuterol (PROAIR HFA) 108 (90 BASE) MCG/ACT inhaler, Inhale 2 puffs into the lungs every 6 (six) hours as needed for wheezing or shortness of breath., Disp: 1 Inhaler, Rfl: 2   amLODipine (NORVASC) 10 MG tablet, Take 1 tablet (10 mg total) by mouth daily., Disp: 90 tablet, Rfl: 3   aspirin EC 81 MG tablet, Take 1 tablet (81 mg total) by mouth daily., Disp: 90 tablet, Rfl: 3   Cyanocobalamin (VITAMIN B-12 PO), Take 2,500 mcg by mouth daily., Disp: , Rfl:    levothyroxine (SYNTHROID) 175 MCG tablet, Take 1 tablet (175 mcg total) by mouth daily., Disp: 90 tablet, Rfl: 1   lisinopril (ZESTRIL) 20 MG tablet, Take 1 tablet (20 mg total) by mouth daily.,  Disp: 90 tablet, Rfl: 3   Multiple Vitamin (MULTIVITAMIN WITH MINERALS) TABS tablet, Take 1 tablet by mouth daily., Disp: , Rfl:    naloxegol oxalate (MOVANTIK) 12.5 MG TABS tablet, Take 1 tablet (12.5 mg total) by mouth daily., Disp: 90 tablet, Rfl: 3   oxyCODONE-acetaminophen (PERCOCET) 7.5-325 MG tablet, Take 1 tablet by mouth every 6 (six) hours as needed for severe pain., Disp: 120 tablet, Rfl: 0   oxyCODONE-acetaminophen (PERCOCET) 7.5-325 MG tablet, Take 1 tablet by mouth every 6 (six) hours as needed for severe pain., Disp: 120 tablet, Rfl: 0   [START ON 11/18/2020] oxyCODONE-acetaminophen (PERCOCET) 7.5-325 MG tablet, Take 1 tablet by mouth every 6 (six) hours as needed for severe pain., Disp: 120 tablet, Rfl: 0   sertraline (ZOLOFT) 50 MG tablet, Take 1 tablet (50 mg total) by mouth daily., Disp: 90 tablet, Rfl: 3   sildenafil (REVATIO) 20 MG tablet, Take 1-3 tablets (20-60 mg total) by mouth as needed. (Patient taking differently: Take 20-60 mg by mouth as needed (ed).), Disp: 30 tablet, Rfl: 3   simvastatin (ZOCOR) 40 MG tablet, Take 1 tablet (40 mg total) by mouth at bedtime., Disp: 90 tablet, Rfl: 3  No Known Allergies Past Medical History:  Diagnosis Date   Anginal pain ([facility])    Arthritis    OSTEO BACK KNEES HIPS & HANDS   Arthropathy, unspecified, site unspecified    Chronic airway obstruction, not elsewhere classified    Coronary atherosclerosis of  unspecified type of vessel, native or graft    a. [place] 5/16:  mLAD 20%, CFX stent ok, OM3 stent ok, mRCA 30%, normal LVF   Headache(784.0)    Hx of echocardiogram    a. Echo 5/16:  mild LVH, EF 60%, mildly dilated Ao root (40 mm)   Hypothyroidism    Other and unspecified hyperlipidemia    Thyroid disease    hypothyroid   Unspecified essential hypertension    Unspecified sleep apnea    Dr. Jacelyn Grip at [facility] told pt he had sleep apnea, but never sent him for a sleep study so he doesn't wear a CPAP     Observations/Objective: A&O  No respiratory distress or wheezing audible over the phone Mood, judgement, and thought processes all WNL  Assessment and Plan: 1. Viral URI Discussed symptom management. Advised if he starts having an increase in wheezing or shortness of breath he needs to let me know so an antibiotic and steroids can be sent in since he has COPD.    Follow Up Instructions:  I discussed the assessment and treatment plan with the patient. The patient was provided an opportunity to ask questions and all were answered. The patient agreed with the plan and demonstrated an understanding of the instructions.   The patient was advised to call back or seek an in-person evaluation if the symptoms worsen or if the condition fails to improve as anticipated.  The above assessment and management plan was discussed with the patient. The patient verbalized understanding of and has agreed to the management plan. Patient is aware to call the clinic if symptoms persist or worsen. Patient is aware when to return to the clinic for a follow-up visit. Patient educated on when it is appropriate to go to the emergency department.   Time call ended: 12:34 PM  I provided 11 minutes of non-face-to-face time during this encounter.  Hendricks Limes, MSN, APRN, FNP-C [facility] 11/13/20

## 2020-12-21 ENCOUNTER — Other Ambulatory Visit: Payer: Self-pay | Admitting: Family Medicine

## 2020-12-21 ENCOUNTER — Ambulatory Visit (INDEPENDENT_AMBULATORY_CARE_PROVIDER_SITE_OTHER): Payer: Medicare Other | Admitting: Family Medicine

## 2020-12-21 ENCOUNTER — Encounter: Payer: Self-pay | Admitting: Family Medicine

## 2020-12-21 VITALS — BP 137/73 | HR 67 | Ht 72.0 in | Wt 232.0 lb

## 2020-12-21 DIAGNOSIS — M4712 Other spondylosis with myelopathy, cervical region: Secondary | ICD-10-CM | POA: Diagnosis not present

## 2020-12-21 DIAGNOSIS — E782 Mixed hyperlipidemia: Secondary | ICD-10-CM | POA: Diagnosis not present

## 2020-12-21 DIAGNOSIS — I10 Essential (primary) hypertension: Secondary | ICD-10-CM | POA: Diagnosis not present

## 2020-12-21 DIAGNOSIS — E079 Disorder of thyroid, unspecified: Secondary | ICD-10-CM

## 2020-12-21 DIAGNOSIS — G8929 Other chronic pain: Secondary | ICD-10-CM

## 2020-12-21 DIAGNOSIS — Z0289 Encounter for other administrative examinations: Secondary | ICD-10-CM

## 2020-12-21 MED ORDER — OXYCODONE-ACETAMINOPHEN 7.5-325 MG PO TABS: 1.0000 | ORAL_TABLET | Freq: Four times a day (QID) | ORAL | 0 refills | Status: DC | PRN

## 2020-12-21 NOTE — Progress Notes (Signed)
BP 137/73    Pulse 67    Ht 6' (1.829 m)    Wt 232 lb (105.2 kg)    SpO2 97%    BMI 31.46 kg/m    Subjective:   Patient ID: [name], male    DOB: [birth year], 75 y.o.   MRN: [id number]  HPI: [name] is a 75 y.o. male presenting on 12/21/2020 for Medical Management of Chronic Issues, Hyperlipidemia, and Hypertension   HPI Hypertension Patient is currently on lisinopril and amlodipine, and their blood pressure today is 137/73. Patient denies any lightheadedness or dizziness. Patient denies headaches, blurred vision, chest pains, shortness of breath, or weakness. Denies any side effects from medication and is content with current medication.   Hyperlipidemia Patient is coming in for recheck of his hyperlipidemia. The patient is currently taking simvastatin. They deny any issues with myalgias or history of liver damage from it. They deny any focal numbness or weakness or chest pain.   Pain assessment: Cause of pain-arthritis of the knee and back Pain location-knee and back Pain on scale of 1-10- 5 Frequency-Daily  What increases pain-weather changes What makes pain Better-medication and relaxation Effects on ADL -minimal Any change in general medical condition-none  Current opioids rx-oxycodone 7.5-325 qid prn # meds rx- 120 Effectiveness of current meds-works well Adverse reactions from pain meds-none Morphine equivalent-45  Pill count performed-No Last drug screen -03/27/2020 ( high risk q43m moderate risk q692mlow risk yearly ) Urine drug screen today- No Was [place]- yes  If yes were their any concerning findings? -none  Overdose risk: 120 Opioid Risk  03/27/2019  Alcohol 0  Illegal Drugs 0  Rx Drugs 0  Alcohol 0  Illegal Drugs 0  Rx Drugs 0  Age between 16-45 years  0  History of Preadolescent Sexual Abuse 0  Psychological Disease 0  Depression 1  Opioid Risk Tool Scoring 1  Opioid Risk Interpretation Low Risk     Pain contract  signed on: 03/27/20  Relevant past medical, surgical, family and social history reviewed and updated as indicated. Interim medical history since our last visit reviewed. Allergies and medications reviewed and updated.  Review of Systems  Constitutional:  Positive for fatigue. Negative for chills and fever.  Respiratory:  Negative for shortness of breath and wheezing.   Cardiovascular:  Negative for chest pain and leg swelling.  Endocrine: Negative for cold intolerance and heat intolerance.  Musculoskeletal:  Positive for arthralgias and back pain. Negative for gait problem.  Skin:  Negative for color change and rash.  All other systems reviewed and are negative.  Per HPI unless specifically indicated above   Allergies as of 12/21/2020   No Known Allergies      Medication List        Accurate as of December 21, 2020  9:22 AM. If you have any questions, ask your nurse or doctor.          albuterol 108 (90 Base) MCG/ACT inhaler Commonly known as: ProAir HFA Inhale 2 puffs into the lungs every 6 (six) hours as needed for wheezing or shortness of breath.   amLODipine 10 MG tablet Commonly known as: NORVASC Take 1 tablet (10 mg total) by mouth daily.   aspirin EC 81 MG tablet Take 1 tablet (81 mg total) by mouth daily.   levothyroxine 175 MCG tablet Commonly known as: SYNTHROID Take 1 tablet (175 mcg total) by mouth daily.   lisinopril 20 MG tablet Commonly  as: ZESTRIL °Take 1 tablet (20 mg total) by mouth daily. °  °multivitamin with minerals Tabs tablet °Take 1 tablet by mouth daily. °  °naloxegol oxalate 12.5 MG Tabs tablet °Commonly known as: Movantik °Take 1 tablet (12.5 mg total) by mouth daily. °  °oxyCODONE-acetaminophen 7.5-325 MG tablet °Commonly known as: PERCOCET °Take 1 tablet by mouth every 6 (six) hours as needed for severe pain. °What changed: Another medication with the same name was changed. Make sure you understand how and when to take each. °Changed  by:  A , MD °  °oxyCODONE-acetaminophen 7.5-325 MG tablet °Commonly known as: PERCOCET °Take 1 tablet by mouth every 6 (six) hours as needed for severe pain. °Start taking on: January 20, 2021 °What changed: These instructions start on January 20, 2021. If you are unsure what to do until then, ask your doctor or other care provider. °Changed by:  A , MD °  °oxyCODONE-acetaminophen 7.5-325 MG tablet °Commonly known as: Percocet °Take 1 tablet by mouth every 6 (six) hours as needed for severe pain. °Start taking on: February 20, 2021 °What changed: These instructions start on February 20, 2021. If you are unsure what to do until then, ask your doctor or other care provider. °Changed by:  A , MD °  °sertraline 50 MG tablet °Commonly known as: Zoloft °Take 1 tablet (50 mg total) by mouth daily. °  °sildenafil 20 MG tablet °Commonly known as: REVATIO °Take 1-3 tablets (20-60 mg total) by mouth as needed. °What changed: reasons to take this °  °simvastatin 40 MG tablet °Commonly known as: ZOCOR °Take 1 tablet (40 mg total) by mouth at bedtime. °  °VITAMIN B-12 PO °Take 2,500 mcg by mouth daily. °  ° °  ° ° ° °Objective:  ° °BP 137/73    Pulse 67    Ht 6' (1.829 m)    Wt 232 lb (105.2 kg)    SpO2 97%    BMI 31.46 kg/m²   °Wt Readings from Last 3 Encounters:  °12/21/20 232 lb (105.2 kg)  °10/20/20 228 lb 9.6 oz (103.7 kg)  °09/18/20 221 lb (100.2 kg)  °  °Physical Exam °Vitals and nursing note reviewed.  °Constitutional:   °   General: He is not in acute distress. °   Appearance: He is well-developed. He is not diaphoretic.  °Eyes:  °   General: No scleral icterus. °   Conjunctiva/sclera: Conjunctivae normal.  °Neck:  °   Thyroid: No thyromegaly.  °Cardiovascular:  °   Rate and Rhythm: Normal rate and regular rhythm.  °   Heart sounds: Normal heart sounds. No murmur heard. °Pulmonary:  °   Effort: Pulmonary effort is normal. No respiratory distress.  °   Breath sounds: Normal  breath sounds. No wheezing.  °Musculoskeletal:  °   Cervical back: Neck supple.  °Lymphadenopathy:  °   Cervical: No cervical adenopathy.  °Skin: °   General: Skin is warm and dry.  °   Findings: No rash.  °Neurological:  °   Mental Status: He is alert and oriented to person, place, and time.  °   Coordination: Coordination normal.  °Psychiatric:     °   Behavior: Behavior normal.  ° ° ° ° °Assessment & Plan:  ° °Problem List Items Addressed This Visit   ° °  ° Cardiovascular and Mediastinum  ° Essential hypertension - Primary  ° Relevant Orders  ° CBC with Differential/Platelet  ° CMP14+EGFR  °  ° Endocrine  °   Thyroid disease  ° Relevant Orders  ° TSH  °  ° Other  ° Hyperlipidemia  ° Relevant Orders  ° Lipid panel  ° Pain management contract signed  ° Relevant Medications  ° oxyCODONE-acetaminophen (PERCOCET) 7.5-325 MG tablet  ° oxyCODONE-acetaminophen (PERCOCET) 7.5-325 MG tablet (Start on 02/20/2021)  ° oxyCODONE-acetaminophen (PERCOCET) 7.5-325 MG tablet (Start on 01/20/2021)  ° °Other Visit Diagnoses   ° ° Encounter for chronic pain management      ° Relevant Medications  ° oxyCODONE-acetaminophen (PERCOCET) 7.5-325 MG tablet  ° oxyCODONE-acetaminophen (PERCOCET) 7.5-325 MG tablet (Start on 02/20/2021)  ° oxyCODONE-acetaminophen (PERCOCET) 7.5-325 MG tablet (Start on 01/20/2021)  ° Osteoarthritis of cervical spine with myelopathy      ° Relevant Medications  ° oxyCODONE-acetaminophen (PERCOCET) 7.5-325 MG tablet  ° oxyCODONE-acetaminophen (PERCOCET) 7.5-325 MG tablet (Start on 02/20/2021)  ° oxyCODONE-acetaminophen (PERCOCET) 7.5-325 MG tablet (Start on 01/20/2021)  ° °  °Refill pain medications.  We will check blood work for thyroid, has been slightly off the last couple times but he says he still feels fatigued. ° °Follow up plan: °Return in about 3 months (around 03/21/2021), or if symptoms worsen or fail to improve, for Pain and thyroid. ° °Counseling provided for all of the vaccine components °Orders Placed This  Encounter  °Procedures  ° CBC with Differential/Platelet  ° CMP14+EGFR  ° Lipid panel  ° TSH  ° ° ° , MD °[facility] °12/21/2020, 9:22 AM ° ° °  °

## 2020-12-22 LAB — CBC WITH DIFFERENTIAL/PLATELET
Basophils Absolute: 0 10*3/uL (ref 0.0–0.2)
Basos: 0 %
EOS (ABSOLUTE): 0.1 10*3/uL (ref 0.0–0.4)
Eos: 1 %
Hematocrit: 41.8 % (ref 37.5–51.0)
Hemoglobin: 13.4 g/dL (ref 13.0–17.7)
Immature Grans (Abs): 0 10*3/uL (ref 0.0–0.1)
Immature Granulocytes: 0 %
Lymphocytes Absolute: 1.6 10*3/uL (ref 0.7–3.1)
Lymphs: 20 %
MCH: 28 pg (ref 26.6–33.0)
MCHC: 32.1 g/dL (ref 31.5–35.7)
MCV: 87 fL (ref 79–97)
Monocytes Absolute: 0.8 10*3/uL (ref 0.1–0.9)
Monocytes: 10 %
Neutrophils Absolute: 5.6 10*3/uL (ref 1.4–7.0)
Neutrophils: 69 %
Platelets: 316 10*3/uL (ref 150–450)
RBC: 4.78 x10E6/uL (ref 4.14–5.80)
RDW: 16.1 % — ABNORMAL HIGH (ref 11.6–15.4)
WBC: 8.1 10*3/uL (ref 3.4–10.8)

## 2020-12-22 LAB — CMP14+EGFR
ALT: 14 IU/L (ref 0–44)
AST: 20 IU/L (ref 0–40)
Albumin/Globulin Ratio: 2 (ref 1.2–2.2)
Albumin: 4.5 g/dL (ref 3.7–4.7)
Alkaline Phosphatase: 61 IU/L (ref 44–121)
BUN/Creatinine Ratio: 16 (ref 10–24)
BUN: 16 mg/dL (ref 8–27)
Bilirubin Total: 0.2 mg/dL (ref 0.0–1.2)
CO2: 23 mmol/L (ref 20–29)
Calcium: 9.5 mg/dL (ref 8.6–10.2)
Chloride: 102 mmol/L (ref 96–106)
Creatinine, Ser: 0.97 mg/dL (ref 0.76–1.27)
Globulin, Total: 2.3 g/dL (ref 1.5–4.5)
Glucose: 99 mg/dL (ref 70–99)
Potassium: 4.6 mmol/L (ref 3.5–5.2)
Sodium: 139 mmol/L (ref 134–144)
Total Protein: 6.8 g/dL (ref 6.0–8.5)
eGFR: 81 mL/min/{1.73_m2} (ref >59)

## 2020-12-22 LAB — TSH: TSH: 6.31 u[IU]/mL — ABNORMAL HIGH (ref 0.450–4.500)

## 2020-12-22 LAB — LIPID PANEL
Chol/HDL Ratio: 2.7 ratio (ref 0.0–5.0)
Cholesterol, Total: 164 mg/dL (ref 100–199)
HDL: 61 mg/dL (ref >39)
LDL Chol Calc (NIH): 82 mg/dL (ref 0–99)
Triglycerides: 120 mg/dL (ref 0–149)
VLDL Cholesterol Cal: 21 mg/dL (ref 5–40)

## 2020-12-22 NOTE — Telephone Encounter (Signed)
Patient seen yesterday and thyroid levels drawn

## 2020-12-24 ENCOUNTER — Other Ambulatory Visit: Payer: Self-pay

## 2020-12-24 DIAGNOSIS — E079 Disorder of thyroid, unspecified: Secondary | ICD-10-CM

## 2020-12-24 MED ORDER — LEVOTHYROXINE SODIUM 200 MCG PO TABS: 200.0000 ug | ORAL_TABLET | Freq: Every day | ORAL | 3 refills | Status: DC

## 2021-02-10 ENCOUNTER — Other Ambulatory Visit: Payer: Self-pay

## 2021-02-10 ENCOUNTER — Ambulatory Visit: Payer: Medicare Other | Admitting: Gastroenterology

## 2021-02-10 ENCOUNTER — Encounter: Payer: Medicare Other | Admitting: Gastroenterology

## 2021-02-10 ENCOUNTER — Encounter: Payer: Self-pay | Admitting: Gastroenterology

## 2021-02-10 VITALS — BP 133/77 | HR 72 | Temp 97.5°F | Ht 72.0 in | Wt 231.2 lb

## 2021-02-10 DIAGNOSIS — K641 Second degree hemorrhoids: Secondary | ICD-10-CM | POA: Insufficient documentation

## 2021-02-10 NOTE — Progress Notes (Signed)
[place] Banding Note:   [name] is a 76 y.o. male presenting today for consideration of hemorrhoid banding. Last colonoscopy July 2022 with multiple polyps by Dr. Jenetta Downer. He has had left lateral and right anterior banding. He has noted improvement overall since last banding. Still with low-volume bleeding.    The patient presents with symptomatic grade 2 hemorrhoids, unresponsive to maximal medical therapy, requesting rubber band ligation of his hemorrhoidal disease. All risks, benefits, and alternative forms of therapy were described and informed consent was obtained.   The decision was made to band the right posterior internal hemorrhoid, and the Keener was used to perform band ligation without complication. Digital anorectal examination was then performed to assure proper positioning of the band, and to adjust the banded tissue as required. The patient was discharged home without pain or other issues. Dietary and behavioral recommendations were given and (if necessary prescriptions were given), along with follow-up instructions. The patient will return as needed for follow-up.  No complications were encountered and the patient tolerated the procedure well.   Annitta Needs, PhD, ANP-BC [facility]

## 2021-02-10 NOTE — Patient Instructions (Signed)
Continue to avoid constipation, straining, and limit toilet time to 2-3 minutes.  You might need an additional banding. If so, please call, and we will get you in!  Otherwise, we will see you back as needed!  I enjoyed seeing you again today! As you know, I value our relationship and want to provide genuine, compassionate, and quality care. I welcome your feedback. If you receive a survey regarding your visit,  I greatly appreciate you taking time to fill this out. See you next time!  Annitta Needs, PhD, ANP-BC [facility]

## 2021-03-19 ENCOUNTER — Encounter: Payer: Self-pay | Admitting: Family Medicine

## 2021-03-19 ENCOUNTER — Ambulatory Visit (INDEPENDENT_AMBULATORY_CARE_PROVIDER_SITE_OTHER): Payer: Medicare Other | Admitting: Family Medicine

## 2021-03-19 VITALS — BP 138/71 | HR 68 | Ht 72.0 in | Wt 233.0 lb

## 2021-03-19 DIAGNOSIS — M4712 Other spondylosis with myelopathy, cervical region: Secondary | ICD-10-CM

## 2021-03-19 DIAGNOSIS — I10 Essential (primary) hypertension: Secondary | ICD-10-CM

## 2021-03-19 DIAGNOSIS — Z79899 Other long term (current) drug therapy: Secondary | ICD-10-CM | POA: Diagnosis not present

## 2021-03-19 DIAGNOSIS — Z0289 Encounter for other administrative examinations: Secondary | ICD-10-CM

## 2021-03-19 DIAGNOSIS — G8929 Other chronic pain: Secondary | ICD-10-CM

## 2021-03-19 DIAGNOSIS — F320 Major depressive disorder, single episode, mild: Secondary | ICD-10-CM

## 2021-03-19 DIAGNOSIS — E782 Mixed hyperlipidemia: Secondary | ICD-10-CM | POA: Diagnosis not present

## 2021-03-19 DIAGNOSIS — E079 Disorder of thyroid, unspecified: Secondary | ICD-10-CM | POA: Diagnosis not present

## 2021-03-19 MED ORDER — OXYCODONE-ACETAMINOPHEN 7.5-325 MG PO TABS: 1.0000 | ORAL_TABLET | Freq: Four times a day (QID) | ORAL | 0 refills | Status: DC | PRN

## 2021-03-19 MED ORDER — LISINOPRIL 20 MG PO TABS: 20.0000 mg | ORAL_TABLET | Freq: Every day | ORAL | 3 refills | Status: DC

## 2021-03-19 MED ORDER — AMLODIPINE BESYLATE 10 MG PO TABS: 10.0000 mg | ORAL_TABLET | Freq: Every day | ORAL | 3 refills | Status: DC

## 2021-03-19 MED ORDER — SERTRALINE HCL 50 MG PO TABS: 50.0000 mg | ORAL_TABLET | Freq: Every day | ORAL | 3 refills | Status: DC

## 2021-03-19 MED ORDER — SIMVASTATIN 40 MG PO TABS: 40.0000 mg | ORAL_TABLET | Freq: Every day | ORAL | 3 refills | Status: DC

## 2021-03-19 NOTE — Progress Notes (Signed)
? ?BP 138/71   Pulse 68   Ht 6' (1.829 m)   Wt 233 lb (105.7 kg)   SpO2 97%   BMI 31.60 kg/m?   ? ?Subjective:  ? ?Patient ID: [name], male    DOB: 21-Apr-1945, 76 y.o.   MRN: [id number] ? ?HPI: ?[name] is a 76 y.o. male presenting on 03/19/2021 for Medical Management of Chronic Issues, Hyperlipidemia, and Hypertension ? ? ?HPI ?Hypertension ?Patient is currently on amlodipine and lisinopril, and their blood pressure today is 138/71.  Patient has the occasional dizziness but definitely much improved from where it was before.. Patient denies headaches, blurred vision, chest pains, shortness of breath, or weakness. Denies any side effects from medication and is content with current medication.  ? ?Hyperlipidemia ?Patient is coming in for recheck of his hyperlipidemia. The patient is currently taking simvastatin all. They deny any issues with myalgias or history of liver damage from it. They deny any focal numbness or weakness or chest pain. ? ? ?Hypothyroidism recheck ?Patient is coming in for thyroid recheck today as well. They deny any issues with hair changes or heat or cold problems or diarrhea or constipation. They deny any chest pain or palpitations. They are currently on no medicine but it has been showing that he is slightly hypothyroid last few times. ? ?Pain assessment: ?Cause of pain-cervical spine degeneration and arthritis ?Pain location-neck and upper shoulders and back ?Pain on scale of 1-10- 5 ?Frequency-Daily ?What increases pain-certain movements or weather changes ?What makes pain Better-Percocet ?Effects on ADL -minimal ?Any change in general medical condition-none ? ?Current opioids rx- Percocet 7.5-325 every 6 hours as needed ?# meds rx-120 ?Effectiveness of current meds-works well ?Adverse reactions from pain meds-none ?Morphine equivalent-45 ? ?Pill count performed-No ?Last drug screen -03/27/2020 ?( high risk q63m moderate risk q662mlow risk yearly ) ?Urine drug screen  today- Yes ?Was the [place]- yes ? If yes were their any concerning find ings? - none ? ? ?Overdose risk: 140 ?Opioid Risk  03/27/2019  ?Alcohol 0  ?Illegal Drugs 0  ?Rx Drugs 0  ?Alcohol 0  ?Illegal Drugs 0  ?Rx Drugs 0  ?Age between 1657-45ears  0  ?History of Preadolescent Sexual Abuse 0  ?Psychological Disease 0  ?Depression 1  ?Opioid Risk Tool Scoring 1  ?Opioid Risk Interpretation Low Risk  ? ? ? ?Pain contract signed on: 03/27/20 ? ?Relevant past medical, surgical, family and social history reviewed and updated as indicated. Interim medical history since our last visit reviewed. ?Allergies and medications reviewed and updated. ? ?Review of Systems  ?Constitutional:  Negative for chills and fever.  ?Eyes:  Negative for visual disturbance.  ?Respiratory:  Negative for shortness of breath and wheezing.   ?Cardiovascular:  Negative for chest pain and leg swelling.  ?Musculoskeletal:  Positive for arthralgias, myalgias and neck pain. Negative for back pain and gait problem.  ?Skin:  Negative for rash.  ?All other systems reviewed and are negative. ? ?Per HPI unless specifically indicated above ? ? ?Allergies as of 03/19/2021   ?No Known Allergies ?  ? ?  ?Medication List  ?  ? ?  ? Accurate as of March 19, 2021  8:28 AM. If you have any questions, ask your nurse or doctor.  ?  ?  ? ?  ? ?albuterol 108 (90 Base) MCG/ACT inhaler ?Commonly known as: ProAir HFA ?Inhale 2 puffs into the lungs every 6 (six) hours as needed for wheezing or shortness  of breath. ?  ?amLODipine 10 MG tablet ?Commonly known as: NORVASC ?Take 1 tablet (10 mg total) by mouth daily. ?  ?aspirin EC 81 MG tablet ?Take 1 tablet (81 mg total) by mouth daily. ?  ?levothyroxine 200 MCG tablet ?Commonly known as: SYNTHROID ?Take 1 tablet (200 mcg total) by mouth daily. ?  ?lisinopril 20 MG tablet ?Commonly known as: ZESTRIL ?Take 1 tablet (20 mg total) by mouth daily. ?  ?multivitamin with minerals Tabs tablet ?Take 1 tablet by mouth daily. ?   ?naloxegol oxalate 12.5 MG Tabs tablet ?Commonly known as: Movantik ?Take 1 tablet (12.5 mg total) by mouth daily. ?  ?oxyCODONE-acetaminophen 7.5-325 MG tablet ?Commonly known as: PERCOCET ?Take 1 tablet by mouth every 6 (six) hours as needed for severe pain. ?What changed: Another medication with the same name was changed. Make sure you understand how and when to take each. ?Changed by: Worthy Rancher, MD ?  ?oxyCODONE-acetaminophen 7.5-325 MG tablet ?Commonly known as: PERCOCET ?Take 1 tablet by mouth every 6 (six) hours as needed for severe pain. ?Start taking on: April 18, 2021 ?What changed: These instructions start on April 18, 2021. If you are unsure what to do until then, ask your doctor or other care provider. ?Changed by: Worthy Rancher, MD ?  ?oxyCODONE-acetaminophen 7.5-325 MG tablet ?Commonly known as: Percocet ?Take 1 tablet by mouth every 6 (six) hours as needed for severe pain. ?Start taking on: May 18, 2021 ?What changed: These instructions start on May 18, 2021. If you are unsure what to do until then, ask your doctor or other care provider. ?Changed by: Worthy Rancher, MD ?  ?sertraline 50 MG tablet ?Commonly known as: Zoloft ?Take 1 tablet (50 mg total) by mouth daily. ?  ?sildenafil 20 MG tablet ?Commonly known as: REVATIO ?Take 1-3 tablets (20-60 mg total) by mouth as needed. ?What changed: reasons to take this ?  ?simvastatin 40 MG tablet ?Commonly known as: ZOCOR ?Take 1 tablet (40 mg total) by mouth at bedtime. ?  ?VITAMIN B-12 PO ?Take 2,500 mcg by mouth daily. ?  ? ?  ? ? ? ?Objective:  ? ?BP 138/71   Pulse 68   Ht 6' (1.829 m)   Wt 233 lb (105.7 kg)   SpO2 97%   BMI 31.60 kg/m?   ?Wt Readings from Last 3 Encounters:  ?03/19/21 233 lb (105.7 kg)  ?02/10/21 231 lb 3.2 oz (104.9 kg)  ?12/21/20 232 lb (105.2 kg)  ?  ?Physical Exam ?Vitals and nursing note reviewed.  ?Constitutional:   ?   General: He is not in acute distress. ?   Appearance: He is well-developed. He is not  diaphoretic.  ?Eyes:  ?   General: No scleral icterus. ?   Conjunctiva/sclera: Conjunctivae normal.  ?Neck:  ?   Thyroid: No thyromegaly.  ?Cardiovascular:  ?   Rate and Rhythm: Normal rate and regular rhythm.  ?   Heart sounds: Normal heart sounds. No murmur heard. ?Pulmonary:  ?   Effort: Pulmonary effort is normal. No respiratory distress.  ?   Breath sounds: Normal breath sounds. No wheezing.  ?Musculoskeletal:     ?   General: No swelling. Normal range of motion.  ?   Cervical back: Neck supple.  ?Lymphadenopathy:  ?   Cervical: No cervical adenopathy.  ?Skin: ?   General: Skin is warm and dry.  ?   Findings: No rash.  ?Neurological:  ?   Mental Status: He is alert and  oriented to person, place, and time.  ?   Coordination: Coordination normal.  ?Psychiatric:     ?   Behavior: Behavior normal.  ? ? ? ? ?Assessment & Plan:  ? ?Problem List Items Addressed This Visit   ? ?  ? Cardiovascular and Mediastinum  ? Essential hypertension  ? Relevant Medications  ? lisinopril (ZESTRIL) 20 MG tablet  ? amLODipine (NORVASC) 10 MG tablet  ? simvastatin (ZOCOR) 40 MG tablet  ?  ? Endocrine  ? Thyroid disease  ? Relevant Orders  ? TSH  ?  ? Nervous and Auditory  ? Osteoarthritis of cervical spine with myelopathy - Primary  ? Relevant Medications  ? oxyCODONE-acetaminophen (PERCOCET) 7.5-325 MG tablet  ? oxyCODONE-acetaminophen (PERCOCET) 7.5-325 MG tablet (Start on 05/18/2021)  ? oxyCODONE-acetaminophen (PERCOCET) 7.5-325 MG tablet (Start on 04/18/2021)  ?  ? Other  ? Hyperlipidemia  ? Relevant Medications  ? lisinopril (ZESTRIL) 20 MG tablet  ? amLODipine (NORVASC) 10 MG tablet  ? simvastatin (ZOCOR) 40 MG tablet  ? Pain management contract signed  ? Relevant Medications  ? oxyCODONE-acetaminophen (PERCOCET) 7.5-325 MG tablet  ? oxyCODONE-acetaminophen (PERCOCET) 7.5-325 MG tablet (Start on 05/18/2021)  ? oxyCODONE-acetaminophen (PERCOCET) 7.5-325 MG tablet (Start on 04/18/2021)  ? Depression, major, single episode, mild ([place])   ? Relevant Medications  ? sertraline (ZOLOFT) 50 MG tablet  ? ?Other Visit Diagnoses   ? ? Controlled substance agreement signed      ? Relevant Orders  ? ToxASSURE Select 13 (MW), Urine  ? Encounter for chronic pa

## 2021-03-20 LAB — TSH: TSH: 4.55 u[IU]/mL — ABNORMAL HIGH (ref 0.450–4.500)

## 2021-03-25 LAB — TOXASSURE SELECT 13 (MW), URINE

## 2021-04-13 NOTE — Patient Instructions (Signed)
[name] , ?Thank you for taking time to come for your Medicare Wellness Visit. I appreciate your ongoing commitment to your health goals. Please review the following plan we discussed and let me know if I can assist you in the future.  ? ?Screening recommendations/referrals: ?Colonoscopy: Done 07/17/2020 - repeat in 3 years ?Recommended yearly ophthalmology/optometry visit for glaucoma screening and checkup ?Recommended yearly dental visit for hygiene and checkup ? ?Vaccinations: ?Influenza vaccine: Declined - recommended every fall ?Pneumococcal vaccine: Done 12/24/2012 & 10/02/2017 ?Tdap vaccine: Done 04/04/2013 - Repeat in 10 years ?Shingles vaccine: Due* - Shingrix is 2 doses 2-6 months apart and over 90% effective (covered by Medicare in 2023!) ?Covid-19: Declined - recommend 2 doses one month apart plus booster 6 months later ? ?Advanced directives: Advance directive discussed with you today. Even though you declined this today, please call our office should you change your mind, and we can give you the proper paperwork for you to fill out.  ? ?Conditions/risks identified: Try to get more active. Aim for 30 minutes of exercise or brisk walking, 6-8 glasses of water, and 5 servings of fruits and vegetables each day. Also work on puzzles and things to improve your memory. ? ?Next appointment: Follow up in one year for your annual wellness visit.  ? ?Preventive Care 19 Years and Older, Male ? ?Preventive care refers to lifestyle choices and visits with your health care provider that can promote health and wellness. ?What does preventive care include? ?A yearly physical exam. This is also called an annual well check. ?Dental exams once or twice a year. ?Routine eye exams. Ask your health care provider how often you should have your eyes checked. ?Personal lifestyle choices, including: ?Daily care of your teeth and gums. ?Regular physical activity. ?Eating a healthy diet. ?Avoiding tobacco and drug use. ?Limiting  alcohol use. ?Practicing safe sex. ?Taking low doses of aspirin every day. ?Taking vitamin and mineral supplements as recommended by your health care provider. ?What happens during an annual well check? ?The services and screenings done by your health care provider during your annual well check will depend on your age, overall health, lifestyle risk factors, and family history of disease. ?Counseling  ?Your health care provider may ask you questions about your: ?Alcohol use. ?Tobacco use. ?Drug use. ?Emotional well-being. ?Home and relationship well-being. ?Sexual activity. ?Eating habits. ?History of falls. ?Memory and ability to understand (cognition). ?Work and work Statistician. ?Screening  ?You may have the following tests or measurements: ?Height, weight, and BMI. ?Blood pressure. ?Lipid and cholesterol levels. These may be checked every 5 years, or more frequently if you are over 44 years old. ?Skin check. ?Lung cancer screening. You may have this screening every year starting at age 74 if you have a 30-pack-year history of smoking and currently smoke or have quit within the past 15 years. ?Fecal occult blood test (FOBT) of the stool. You may have this test every year starting at age 8. ?Flexible sigmoidoscopy or colonoscopy. You may have a sigmoidoscopy every 5 years or a colonoscopy every 10 years starting at age 21. ?Prostate cancer screening. Recommendations will vary depending on your family history and other risks. ?Hepatitis C blood test. ?Hepatitis B blood test. ?Sexually transmitted disease (STD) testing. ?Diabetes screening. This is done by checking your blood sugar (glucose) after you have not eaten for a while (fasting). You may have this done every 1-3 years. ?Abdominal aortic aneurysm (AAA) screening. You may need this if you are a current or  former smoker. ?Osteoporosis. You may be screened starting at age 73 if you are at high risk. ?Talk with your health care provider about your test results,  treatment options, and if necessary, the need for more tests. ?Vaccines  ?Your health care provider may recommend certain vaccines, such as: ?Influenza vaccine. This is recommended every year. ?Tetanus, diphtheria, and acellular pertussis (Tdap, Td) vaccine. You may need a Td booster every 10 years. ?Zoster vaccine. You may need this after age 41. ?Pneumococcal 13-valent conjugate (PCV13) vaccine. One dose is recommended after age 63. ?Pneumococcal polysaccharide (PPSV23) vaccine. One dose is recommended after age 74. ?Talk to your health care provider about which screenings and vaccines you need and how often you need them. ?This information is not intended to replace advice given to you by your health care provider. Make sure you discuss any questions you have with your health care provider. ?Document Released: 01/16/2015 Document Revised: 09/09/2015 Document Reviewed: 10/21/2014 ?Elsevier Interactive Patient Education ? 2017 [place]. ? ?Fall Prevention in the Home ?Falls can cause injuries. They can happen to people of all ages. There are many things you can do to make your home safe and to help prevent falls. ?What can I do on the outside of my home? ?Regularly fix the edges of walkways and driveways and fix any cracks. ?Remove anything that might make you trip as you walk through a door, such as a raised step or threshold. ?Trim any bushes or trees on the path to your home. ?Use bright outdoor lighting. ?Clear any walking paths of anything that might make someone trip, such as rocks or tools. ?Regularly check to see if handrails are loose or broken. Make sure that both sides of any steps have handrails. ?Any raised decks and porches should have guardrails on the edges. ?Have any leaves, snow, or ice cleared regularly. ?Use sand or salt on walking paths during winter. ?Clean up any spills in your garage right away. This includes oil or grease spills. ?What can I do in the bathroom? ?Use night  lights. ?Install grab bars by the toilet and in the tub and shower. Do not use towel bars as grab bars. ?Use non-skid mats or decals in the tub or shower. ?If you need to sit down in the shower, use a plastic, non-slip stool. ?Keep the floor dry. Clean up any water that spills on the floor as soon as it happens. ?Remove soap buildup in the tub or shower regularly. ?Attach bath mats securely with double-sided non-slip rug tape. ?Do not have throw rugs and other things on the floor that can make you trip. ?What can I do in the bedroom? ?Use night lights. ?Make sure that you have a light by your bed that is easy to reach. ?Do not use any sheets or blankets that are too big for your bed. They should not hang down onto the floor. ?Have a firm chair that has side arms. You can use this for support while you get dressed. ?Do not have throw rugs and other things on the floor that can make you trip. ?What can I do in the kitchen? ?Clean up any spills right away. ?Avoid walking on wet floors. ?Keep items that you use a lot in easy-to-reach places. ?If you need to reach something above you, use a strong step stool that has a grab bar. ?Keep electrical cords out of the way. ?Do not use floor polish or wax that makes floors slippery. If you must use wax, use  non-skid floor wax. ?Do not have throw rugs and other things on the floor that can make you trip. ?What can I do with my stairs? ?Do not leave any items on the stairs. ?Make sure that there are handrails on both sides of the stairs and use them. Fix handrails that are broken or loose. Make sure that handrails are as long as the stairways. ?Check any carpeting to make sure that it is firmly attached to the stairs. Fix any carpet that is loose or worn. ?Avoid having throw rugs at the top or bottom of the stairs. If you do have throw rugs, attach them to the floor with carpet tape. ?Make sure that you have a light switch at the top of the stairs and the bottom of the stairs. If  you do not have them, ask someone to add them for you. ?What else can I do to help prevent falls? ?Wear shoes that: ?Do not have high heels. ?Have rubber bottoms. ?Are comfortable and fit you well. ?Are closed at

## 2021-04-14 ENCOUNTER — Ambulatory Visit (INDEPENDENT_AMBULATORY_CARE_PROVIDER_SITE_OTHER): Payer: Medicare Other

## 2021-04-14 VITALS — Wt 233.0 lb

## 2021-04-14 DIAGNOSIS — Z599 Problem related to housing and economic circumstances, unspecified: Secondary | ICD-10-CM | POA: Diagnosis not present

## 2021-04-14 DIAGNOSIS — Z0001 Encounter for general adult medical examination with abnormal findings: Secondary | ICD-10-CM

## 2021-04-14 DIAGNOSIS — H903 Sensorineural hearing loss, bilateral: Secondary | ICD-10-CM | POA: Diagnosis not present

## 2021-04-14 DIAGNOSIS — Z0000 Encounter for general adult medical examination without abnormal findings: Secondary | ICD-10-CM

## 2021-04-14 NOTE — Progress Notes (Signed)
? ?Subjective:  ? [name] is a 76 y.o. male who presents for Medicare Annual/Subsequent preventive examination. ? ?Virtual Visit via Telephone Note ? ?I connected with  [name] on 04/14/21 at  8:15 AM EDT by telephone and verified that I am speaking with the correct person using two identifiers. ? ?Location: ?Patient: Home ?Provider: [facility] ?Persons participating in the virtual visit: patient/Nurse Health Advisor ?  ?I discussed the limitations, risks, security and privacy concerns of performing an evaluation and management service by telephone and the availability of in person appointments. The patient expressed understanding and agreed to proceed. ? ?Interactive audio and video telecommunications were attempted between this nurse and patient, however failed, due to patient having technical difficulties OR patient did not have access to video capability.  We continued and completed visit with audio only. ? ?Some vital signs may be absent or patient reported.  ? ?Charles Lightcap Dionne Ano, LPN  ? ?Review of Systems    ? ?Cardiac Risk Factors include: advanced age (>81mn, >>70women);obesity (BMI >30kg/m2);sedentary lifestyle;male gender;dyslipidemia;hypertension;Other (see comment), Risk factor comments: CAD, COPD ? ?   ?Objective:  ?  ?Today's Vitals  ? 04/14/21 0818  ?Weight: 233 lb (105.7 kg)  ?PainSc: 4   ? ?Body mass index is 31.6 kg/m?. ? ? ?  04/14/2021  ?  8:30 AM 07/17/2020  ?  7:41 AM 06/19/2020  ?  7:39 AM 06/17/2020  ?  1:18 PM 04/13/2020  ?  8:17 AM 04/11/2019  ?  8:58 AM 12/12/2017  ?  8:58 AM  ?Advanced Directives  ?Does Patient Have a Medical Advance Directive? No No No No No No No  ?Would patient like information on creating a medical advance directive? No - Patient declined No - Patient declined No - Patient declined No - Patient declined Yes (MAU/Ambulatory/Procedural Areas - Information given) No - Patient declined No - Patient declined  ? ? ?Current Medications (verified) ?Outpatient Encounter  Medications as of 04/14/2021  ?Medication Sig  ? albuterol (PROAIR HFA) 108 (90 BASE) MCG/ACT inhaler Inhale 2 puffs into the lungs every 6 (six) hours as needed for wheezing or shortness of breath.  ? amLODipine (NORVASC) 10 MG tablet Take 1 tablet (10 mg total) by mouth daily.  ? aspirin EC 81 MG tablet Take 1 tablet (81 mg total) by mouth daily.  ? Cyanocobalamin (VITAMIN B-12 PO) Take 2,500 mcg by mouth daily.  ? levothyroxine (SYNTHROID) 200 MCG tablet Take 1 tablet (200 mcg total) by mouth daily.  ? lisinopril (ZESTRIL) 20 MG tablet Take 1 tablet (20 mg total) by mouth daily.  ? Multiple Vitamin (MULTIVITAMIN WITH MINERALS) TABS tablet Take 1 tablet by mouth daily.  ? naloxegol oxalate (MOVANTIK) 12.5 MG TABS tablet Take 1 tablet (12.5 mg total) by mouth daily.  ? oxyCODONE-acetaminophen (PERCOCET) 7.5-325 MG tablet Take 1 tablet by mouth every 6 (six) hours as needed for severe pain.  ? sertraline (ZOLOFT) 50 MG tablet Take 1 tablet (50 mg total) by mouth daily.  ? sildenafil (REVATIO) 20 MG tablet Take 1-3 tablets (20-60 mg total) by mouth as needed. (Patient taking differently: Take 20-60 mg by mouth as needed (ed).)  ? simvastatin (ZOCOR) 40 MG tablet Take 1 tablet (40 mg total) by mouth at bedtime.  ? [START ON 05/18/2021] oxyCODONE-acetaminophen (PERCOCET) 7.5-325 MG tablet Take 1 tablet by mouth every 6 (six) hours as needed for severe pain.  ? [START ON 04/18/2021] oxyCODONE-acetaminophen (PERCOCET) 7.5-325 MG tablet Take 1 tablet by mouth  every 6 (six) hours as needed for severe pain.  ? ?No facility-administered encounter medications on file as of 04/14/2021.  ? ? ?Allergies (verified) ?Patient has no known allergies.  ? ?History: ?Past Medical History:  ?Diagnosis Date  ? Anginal pain ([place])   ? Arthritis   ? OSTEO BACK KNEES HIPS & HANDS  ? Arthropathy, unspecified, site unspecified   ? Chronic airway obstruction, not elsewhere classified   ? Coronary atherosclerosis of unspecified type of vessel,  native or graft   ? a. LHC 5/16:  mLAD 20%, CFX stent ok, OM3 stent ok, mRCA 30%, normal LVF  ? Headache(784.0)   ? Hx of echocardiogram   ? a. Echo 5/16:  mild LVH, EF 60%, mildly dilated Ao root (40 mm)  ? Hypothyroidism   ? Other and unspecified hyperlipidemia   ? Thyroid disease   ? hypothyroid  ? Unspecified essential hypertension   ? Unspecified sleep apnea   ? Dr. Jacelyn Grip at [facility] told pt he had sleep apnea, but never sent him for a sleep study so he doesn't wear a CPAP  ? ?Past Surgical History:  ?Procedure Laterality Date  ? ANKLE SURGERY    ? BALLOON DILATION N/A 05/02/2013  ? Procedure: BALLOON DILATION;  Surgeon: Rogene Houston, MD;  [facility];  Service: Endoscopy;  Laterality: N/A;  ? BIOPSY  07/17/2020  ? Procedure: BIOPSY;  Surgeon: Harvel Quale, MD;  [facility];  Service: Gastroenterology;;  ? CARDIAC CATHETERIZATION  01/15/2013  ? CARDIAC CATHETERIZATION N/A 05/16/2014  ? Procedure: Left Heart Cath and Coronary Angiography;  Surgeon: Peter M Martinique, MD;  Location: [facility];  Service: Cardiovascular;  Laterality: N/A;  ? COLONOSCOPY N/A 05/02/2013  ? Procedure: COLONOSCOPY;  Surgeon: Rogene Houston, MD;  [facility];  Service: Endoscopy;  Laterality: N/A;  200-moved to 1200 Ann notified pt  ? COLONOSCOPY WITH PROPOFOL N/A 05/08/2020  ? Procedure: COLONOSCOPY WITH PROPOFOL;  Surgeon: Harvel Quale, MD;  [facility];  Service: Gastroenterology;  Laterality: N/A;  AM  ? COLONOSCOPY WITH PROPOFOL N/A 06/19/2020  ? Procedure: COLONOSCOPY WITH PROPOFOL;  Surgeon: Harvel Quale, MD;  [facility];  Service: Gastroenterology;  Laterality: N/A;  [id number]  ? COLONOSCOPY WITH PROPOFOL N/A 07/17/2020  ? Procedure: COLONOSCOPY WITH PROPOFOL;  Surgeon: Harvel Quale, MD;  [facility];  Service: Gastroenterology;  Laterality: N/A;  9:00  ? CORONARY STENT PLACEMENT    ?  2003, 2004  ? ESOPHAGOGASTRODUODENOSCOPY N/A 05/02/2013  ? Procedure: ESOPHAGOGASTRODUODENOSCOPY (EGD);  Surgeon: Rogene Houston, MD;  [facility];  Service: Endoscopy;  Laterality: N/A;  ? HEMORRHOID SURGERY  2022  ? HEMOSTASIS CLIP PLACEMENT  07/17/2020  ? Procedure: HEMOSTASIS CLIP PLACEMENT;  Surgeon: Montez Morita, Quillian Quince, MD;  [facility];  Service: Gastroenterology;;  ? HIP ARTHROPLASTY    ? KNEE SURGERY Left 12/03/2013  ? LEFT HEART CATHETERIZATION WITH CORONARY ANGIOGRAM N/A 01/15/2013  ? Procedure: LEFT HEART CATHETERIZATION WITH CORONARY ANGIOGRAM;  Surgeon: Sinclair Grooms, MD;  Location: [facility];  Service: Cardiovascular;  Laterality: N/A;  ? MALONEY DILATION N/A 05/02/2013  ? Procedure: MALONEY DILATION;  Surgeon: Rogene Houston, MD;  [facility];  Service: Endoscopy;  Laterality: N/A;  ? POLYPECTOMY  07/17/2020  ? Procedure: POLYPECTOMY;  Surgeon: Harvel Quale, MD;  [facility];  Service: Gastroenterology;;  ? SAVORY DILATION N/A  05/02/2013  ? Procedure: SAVORY DILATION;  Surgeon: Rogene Houston, MD;  [facility];  Service: Endoscopy;  Laterality: N/A;  ? TONSILLECTOMY    ? ?Family History  ?Problem Relation Age of Onset  ? Stroke Mother   ? CAD Father   ?     MI at age 31  ? Colon cancer Neg Hx   ? ?Social History  ? ?Socioeconomic History  ? Marital status: Widowed  ?  Spouse name: Not on file  ? Number of children: 2  ? Years of education: Not on file  ? Highest education level: 11th grade  ?Occupational History  ? Occupation: Building control surveyor  ?  Employer: [employer]  ? Occupation: Retired   ?Tobacco Use  ? Smoking status: Former  ?  Packs/day: 3.00  ?  Years: 20.00  ?  Pack years: 60.00  ?  Types: Cigarettes  ?  Quit date: 05/26/1975  ?  Years since quitting: 45.9  ? Smokeless tobacco: Current  ?  Types: Chew  ?Vaping Use  ? Vaping Use: Never used  ?Substance and Sexual Activity  ? Alcohol use: No  ? Drug use: No  ?  Sexual activity: Not Currently  ?Other Topics Concern  ? Not on file  ?Social History Narrative  ? 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 Widowed since around 2018? - 2 children one boy,on

## 2021-04-16 ENCOUNTER — Telehealth: Payer: Self-pay | Admitting: *Deleted

## 2021-04-16 NOTE — Telephone Encounter (Signed)
? ?  Telephone encounter was:  Successful.  ?04/16/2021 ?Name: SYRUS NAKAMA MRN: [id number] DOB: [birth year] ? ?MONTOYA BRANDEL is a 76 y.o. year old male who is a primary care patient of Dettinger, Fransisca Kaufmann, MD . The community resource team was consulted for assistance with  hearing aids ? ?Care guide performed the following interventions: Patient provided with information about care guide support team and interviewed to confirm resource needs ?Discussed resources to assist with hearing aids . ? ?Follow Up Plan:  some benefit on insurance also mailing him the [place] division of deaf and hard of hearing  ? ?Paizlee Kinder Greenauer -Selinda Eon ?Care Guide , Embedded Care Coordination ?[place], Care Management  ?[phone] ?[address] , [place] [address] ?Email : [email or web address] ?  ?

## 2021-04-27 ENCOUNTER — Telehealth: Payer: Self-pay | Admitting: *Deleted

## 2021-04-27 NOTE — Telephone Encounter (Signed)
? ?  Telephone encounter was:  Successful.  ?04/27/2021 ?Name: [name] MRN: [id number] DOB: 1945-07-27 ? ?[name] is a 76 y.o. year old male who is a primary care patient of Dettinger, Fransisca Kaufmann, MD . The community resource team was consulted for assistance with  hearing aid ? ?Care guide performed the following interventions: Follow up call placed to the patient to discuss status of referral. ? ?Follow Up Plan:  No further follow up planned at this time. The patient has been provided with needed resources. ? ?Lovett Sox -Selinda Eon ?Care Guide , Embedded Care Coordination ?[place], Care Management  ?[phone] ?[address] , [place] [address] ?Email : [email or web address] ?  ?

## 2021-05-14 ENCOUNTER — Telehealth: Payer: Self-pay | Admitting: Family Medicine

## 2021-05-14 NOTE — Telephone Encounter (Signed)
Called pt - he is wanting paper chart requested - he is need of the original date of THY DX - thinks it was with Dr Janean Sark  ?

## 2021-05-18 NOTE — Telephone Encounter (Signed)
Paper charts were pulled and reviewed - he came to [facility] as a NEW pt on 06/11/2001. At that time he was already (previously) DX'd with hypothyroidism (note states Dr Mallie Mussel)  ? ?Pt made aware  ?

## 2021-06-16 ENCOUNTER — Encounter: Payer: Self-pay | Admitting: Family Medicine

## 2021-06-16 ENCOUNTER — Ambulatory Visit (INDEPENDENT_AMBULATORY_CARE_PROVIDER_SITE_OTHER): Payer: Medicare Other | Admitting: Family Medicine

## 2021-06-16 VITALS — BP 138/79 | HR 73 | Temp 98.0°F | Ht 72.0 in | Wt 228.0 lb

## 2021-06-16 DIAGNOSIS — I10 Essential (primary) hypertension: Secondary | ICD-10-CM

## 2021-06-16 DIAGNOSIS — Z125 Encounter for screening for malignant neoplasm of prostate: Secondary | ICD-10-CM | POA: Diagnosis not present

## 2021-06-16 DIAGNOSIS — E782 Mixed hyperlipidemia: Secondary | ICD-10-CM | POA: Diagnosis not present

## 2021-06-16 DIAGNOSIS — Z0289 Encounter for other administrative examinations: Secondary | ICD-10-CM

## 2021-06-16 DIAGNOSIS — G8929 Other chronic pain: Secondary | ICD-10-CM

## 2021-06-16 DIAGNOSIS — E079 Disorder of thyroid, unspecified: Secondary | ICD-10-CM

## 2021-06-16 DIAGNOSIS — M4712 Other spondylosis with myelopathy, cervical region: Secondary | ICD-10-CM | POA: Diagnosis not present

## 2021-06-16 MED ORDER — OXYCODONE-ACETAMINOPHEN 7.5-325 MG PO TABS: 1.0000 | ORAL_TABLET | Freq: Four times a day (QID) | ORAL | 0 refills | Status: DC | PRN

## 2021-06-16 NOTE — Progress Notes (Signed)
BP 138/79   Pulse 73   Temp 98 F (36.7 C)   Ht 6' (1.829 m)   Wt 228 lb (103.4 kg)   SpO2 94%   BMI 30.92 kg/m    Subjective:   Patient ID: [name], male    DOB: Feb 09, 1945, 76 y.o.   MRN: [id number]  HPI: [name] is a 76 y.o. male presenting on 06/16/2021 for Medical Management of Chronic Issues, Hypothyroidism, Hyperlipidemia, Hypertension, and Osteoarthritis   HPI Hypertension Patient is currently on amlodipine and lisinopril, and their blood pressure today is 138/79. Patient denies any lightheadedness or dizziness. Patient denies headaches, blurred vision, chest pains, shortness of breath, or weakness. Denies any side effects from medication and is content with current medication.   Hypothyroidism recheck Patient is coming in for thyroid recheck today as well. They deny any issues with hair changes or heat or cold problems or diarrhea or constipation. They deny any chest pain or palpitations. They are currently on levothyroxine 200 micrograms   Hyperlipidemia Patient is coming in for recheck of his hyperlipidemia. The patient is currently taking simvastatin. They deny any issues with myalgias or history of liver damage from it. They deny any focal numbness or weakness or chest pain.   Pain assessment: Cause of pain-osteoarthrosis of cervical spine Pain location-neck and upper back back Pain on scale of 1-10- 3 Frequency-Daily What increases pain-weather changes and prolonged standing What makes pain Better-oxycodone and rest Effects on ADL -minimal Any change in general medical condition-none  Current opioids rx-oxycodone 7.5-325 every 6 hours as needed # meds rx- 120 Effectiveness of current meds-works well Adverse reactions from pain meds-none Morphine equivalent-45  Pill count performed-No Last drug screen -03/28/2018 ( high risk q18m moderate risk q66mlow risk yearly ) Urine drug [place] today- Yes [place]  If yes were  their any concerning findings? -None    03/27/2019    8:56 AM  Opioid Risk   Alcohol 0  Illegal Drugs 0  Rx Drugs 0  Alcohol 0  Illegal Drugs 0  Rx Drugs 0  Age between 16-45 years  0  History of Preadolescent Sexual Abuse 0  Psychological Disease 0  Depression 1  Opioid Risk Tool Scoring 1  Opioid Risk Interpretation Low Risk   Pain contract signed on: Today  Relevant past medical, surgical, family and social history reviewed and updated as indicated. Interim medical history since our last visit reviewed. Allergies and medications reviewed and updated.  Review of Systems  Constitutional:  Negative for chills and fever.  Eyes:  Negative for visual disturbance.  Respiratory:  Negative for shortness of breath and wheezing.   Cardiovascular:  Negative for chest pain and leg swelling.  Musculoskeletal:  Positive for arthralgias, back pain and neck pain. Negative for gait problem.  Skin:  Negative for rash.  Neurological:  Negative for dizziness, weakness and numbness.  All other systems reviewed and are negative.   Per HPI unless specifically indicated above   Allergies as of 06/16/2021   No Known Allergies      Medication List        Accurate as of June 16, 2021  1:25 PM. If you have any questions, ask your nurse or doctor.          albuterol 108 (90 Base) MCG/ACT inhaler Commonly known as: ProAir HFA Inhale 2 puffs into the lungs every 6 (six) hours as needed for wheezing or shortness of breath.   amLODipine  10 MG tablet Commonly known as: NORVASC Take 1 tablet (10 mg total) by mouth daily.   aspirin EC 81 MG tablet Take 1 tablet (81 mg total) by mouth daily.   levothyroxine 200 MCG tablet Commonly known as: SYNTHROID Take 1 tablet (200 mcg total) by mouth daily.   lisinopril 20 MG tablet Commonly known as: ZESTRIL Take 1 tablet (20 mg total) by mouth daily.   multivitamin with minerals Tabs tablet Take 1 tablet by mouth daily.   naloxegol oxalate  12.5 MG Tabs tablet Commonly known as: Movantik Take 1 tablet (12.5 mg total) by mouth daily.   oxyCODONE-acetaminophen 7.5-325 MG tablet Commonly known as: PERCOCET Take 1 tablet by mouth every 6 (six) hours as needed for severe pain. What changed: Another medication with the same name was changed. Make sure you understand how and when to take each. Changed by: Worthy Rancher, MD   oxyCODONE-acetaminophen 7.5-325 MG tablet Commonly known as: PERCOCET Take 1 tablet by mouth every 6 (six) hours as needed for severe pain. Start taking on: July 15, 2021 What changed: These instructions start on July 15, 2021. If you are unsure what to do until then, ask your doctor or other care provider. Changed by: Worthy Rancher, MD   oxyCODONE-acetaminophen 7.5-325 MG tablet Commonly known as: Percocet Take 1 tablet by mouth every 6 (six) hours as needed for severe pain. Start taking on: August 15, 2021 What changed: These instructions start on August 15, 2021. If you are unsure what to do until then, ask your doctor or other care provider. Changed by: Fransisca Kaufmann Shelvie Salsberry, MD   sertraline 50 MG tablet Commonly known as: Zoloft Take 1 tablet (50 mg total) by mouth daily.   sildenafil 20 MG tablet Commonly known as: REVATIO Take 1-3 tablets (20-60 mg total) by mouth as needed. What changed: reasons to take this   simvastatin 40 MG tablet Commonly known as: ZOCOR Take 1 tablet (40 mg total) by mouth at bedtime.   VITAMIN B-12 PO Take 2,500 mcg by mouth daily.         Objective:   BP 138/79   Pulse 73   Temp 98 F (36.7 C)   Ht 6' (1.829 m)   Wt 228 lb (103.4 kg)   SpO2 94%   BMI 30.92 kg/m   Wt Readings from Last 3 Encounters:  06/16/21 228 lb (103.4 kg)  04/14/21 233 lb (105.7 kg)  03/19/21 233 lb (105.7 kg)    Physical Exam Vitals and nursing note reviewed.  Constitutional:      General: He is not in acute distress.    Appearance: He is well-developed. He is not  diaphoretic.  Eyes:     General: No scleral icterus.    Conjunctiva/sclera: Conjunctivae normal.  Neck:     Thyroid: No thyromegaly.  Cardiovascular:     Rate and Rhythm: Normal rate and regular rhythm.     Heart sounds: Normal heart sounds. No murmur heard. Pulmonary:     Effort: Pulmonary effort is normal. No respiratory distress.     Breath sounds: Normal breath sounds. No wheezing.  Musculoskeletal:        General: No swelling. Normal range of motion.     Cervical back: Neck supple.  Lymphadenopathy:     Cervical: No cervical adenopathy.  Skin:    General: Skin is warm and dry.     Findings: No rash.  Neurological:     Mental Status: He is alert and  oriented to person, place, and time.     Coordination: Coordination normal.  Psychiatric:        Behavior: Behavior normal.     Results for orders placed or performed in visit on 03/19/21  ToxASSURE Select 73 (MW), Urine  Result Value Ref Range   Summary Note   TSH  Result Value Ref Range   TSH 4.550 (H) 0.450 - 4.500 uIU/mL    Assessment & Plan:   Problem List Items Addressed This Visit       Cardiovascular and Mediastinum   Essential hypertension - Primary   Relevant Orders   CBC with Differential/Platelet   CMP14+EGFR   Lipid panel   TSH     Endocrine   Thyroid disease   Relevant Orders   CBC with Differential/Platelet   CMP14+EGFR   Lipid panel   TSH     Nervous and Auditory   Osteoarthritis of cervical spine with myelopathy   Relevant Medications   oxyCODONE-acetaminophen (PERCOCET) 7.5-325 MG tablet   oxyCODONE-acetaminophen (PERCOCET) 7.5-325 MG tablet (Start on 08/15/2021)   oxyCODONE-acetaminophen (PERCOCET) 7.5-325 MG tablet (Start on 07/15/2021)   Other Relevant Orders   ToxASSURE Select 85 (MW), Urine     Other   Hyperlipidemia   Relevant Orders   CBC with Differential/Platelet   CMP14+EGFR   Lipid panel   TSH   Pain management contract signed   Relevant Medications    oxyCODONE-acetaminophen (PERCOCET) 7.5-325 MG tablet   oxyCODONE-acetaminophen (PERCOCET) 7.5-325 MG tablet (Start on 08/15/2021)   oxyCODONE-acetaminophen (PERCOCET) 7.5-325 MG tablet (Start on 07/15/2021)   Other Relevant Orders   ToxASSURE Select 13 (MW), Urine   Other Visit Diagnoses     Prostate cancer screening       Relevant Orders   PSA, total and free   Encounter for chronic pain management       Relevant Medications   oxyCODONE-acetaminophen (PERCOCET) 7.5-325 MG tablet   oxyCODONE-acetaminophen (PERCOCET) 7.5-325 MG tablet (Start on 08/15/2021)   oxyCODONE-acetaminophen (PERCOCET) 7.5-325 MG tablet (Start on 07/15/2021)   Other Relevant Orders   ToxASSURE Select 13 (MW), Urine       Blood pressure looks good, recheck thyroid levels are slightly off last time, we will see whether and recheck his other blood work.  Refill pain medicine.  Otherwise no changes. Follow up plan: Return in about 6 months (around 12/16/2021), or if symptoms worsen or fail to improve, for Hypertension and thyroid.  Counseling provided for all of the vaccine components Orders Placed This Encounter  Procedures   CBC with Differential/Platelet   CMP14+EGFR   Lipid panel   TSH   PSA, total and free   ToxASSURE Select 13 (MW), Urine    Caryl Pina, MD [facility] 06/16/2021, 1:25 PM

## 2021-06-17 LAB — CBC WITH DIFFERENTIAL/PLATELET
Basophils Absolute: 0 10*3/uL (ref 0.0–0.2)
Basos: 0 %
EOS (ABSOLUTE): 0.2 10*3/uL (ref 0.0–0.4)
Eos: 2 %
Hematocrit: 41.3 % (ref 37.5–51.0)
Hemoglobin: 13.6 g/dL (ref 13.0–17.7)
Immature Grans (Abs): 0 10*3/uL (ref 0.0–0.1)
Immature Granulocytes: 0 %
Lymphocytes Absolute: 2.1 10*3/uL (ref 0.7–3.1)
Lymphs: 30 %
MCH: 29.7 pg (ref 26.6–33.0)
MCHC: 32.9 g/dL (ref 31.5–35.7)
MCV: 90 fL (ref 79–97)
Monocytes Absolute: 0.8 10*3/uL (ref 0.1–0.9)
Monocytes: 12 %
Neutrophils Absolute: 3.9 10*3/uL (ref 1.4–7.0)
Neutrophils: 56 %
Platelets: 270 10*3/uL (ref 150–450)
RBC: 4.58 x10E6/uL (ref 4.14–5.80)
RDW: 15.1 % (ref 11.6–15.4)
WBC: 7 10*3/uL (ref 3.4–10.8)

## 2021-06-17 LAB — CMP14+EGFR
ALT: 14 IU/L (ref 0–44)
AST: 20 IU/L (ref 0–40)
Albumin/Globulin Ratio: 2 (ref 1.2–2.2)
Albumin: 4.5 g/dL (ref 3.7–4.7)
Alkaline Phosphatase: 61 IU/L (ref 44–121)
BUN/Creatinine Ratio: 19 (ref 10–24)
BUN: 18 mg/dL (ref 8–27)
Bilirubin Total: 0.3 mg/dL (ref 0.0–1.2)
CO2: 22 mmol/L (ref 20–29)
Calcium: 9.5 mg/dL (ref 8.6–10.2)
Chloride: 102 mmol/L (ref 96–106)
Creatinine, Ser: 0.96 mg/dL (ref 0.76–1.27)
Globulin, Total: 2.2 g/dL (ref 1.5–4.5)
Glucose: 104 mg/dL — ABNORMAL HIGH (ref 70–99)
Potassium: 4.4 mmol/L (ref 3.5–5.2)
Sodium: 139 mmol/L (ref 134–144)
Total Protein: 6.7 g/dL (ref 6.0–8.5)
eGFR: 82 mL/min/{1.73_m2} (ref >59)

## 2021-06-17 LAB — LIPID PANEL
Chol/HDL Ratio: 2.6 ratio (ref 0.0–5.0)
Cholesterol, Total: 146 mg/dL (ref 100–199)
HDL: 57 mg/dL (ref >39)
LDL Chol Calc (NIH): 71 mg/dL (ref 0–99)
Triglycerides: 101 mg/dL (ref 0–149)
VLDL Cholesterol Cal: 18 mg/dL (ref 5–40)

## 2021-06-17 LAB — PSA, TOTAL AND FREE
PSA, Free Pct: 35 %
PSA, Free: 0.21 ng/mL
Prostate Specific Ag, Serum: 0.6 ng/mL (ref 0.0–4.0)

## 2021-06-17 LAB — TSH: TSH: 4.26 u[IU]/mL (ref 0.450–4.500)

## 2021-06-22 LAB — TOXASSURE SELECT 13 (MW), URINE

## 2021-06-23 ENCOUNTER — Telehealth: Payer: Self-pay | Admitting: *Deleted

## 2021-06-23 DIAGNOSIS — M4712 Other spondylosis with myelopathy, cervical region: Secondary | ICD-10-CM

## 2021-06-23 MED ORDER — HYDROCODONE-ACETAMINOPHEN 7.5-325 MG PO TABS: 1.0000 | ORAL_TABLET | Freq: Four times a day (QID) | ORAL | 0 refills | Status: DC | PRN

## 2021-06-23 NOTE — Telephone Encounter (Signed)
Pt informed and understood

## 2021-06-23 NOTE — Telephone Encounter (Signed)
Please let him know that I sent in 1 month of hydrocodone for him, he will just have to be aware that it may not be as strong but he can still use it and we will just have to see how the oxycodone comes back or when it comes back.  This refill will replace one of his 3 months of oxycodone

## 2021-06-23 NOTE — Telephone Encounter (Signed)
I would say first have him call another pharmacy and see if they have it in stock, if not we can switch to hydrocodone but hydrocodone is not the same strength as oxycodone so it will be a little bit weaker for him but we could do that if we absolutely have to but I would say first have him call another pharmacy and see if it is in stock.

## 2021-06-23 NOTE — Telephone Encounter (Signed)
TC from [name] at [place] Pt is due for his Oxycodone-Acetam 7.5-325 mg refill [name] has no Oxycodone in stock, he has all strengths of Hydrocodone Please advise if you want to change pt to a Hydrocodone script and send to [place] if pt needs to call around to find a pharmacy that has his prescription in stock for it to be sent to

## 2021-06-23 NOTE — Telephone Encounter (Signed)
Pt is going to call around and see if he can find a pharmacy nearby that has Oxycodone in stock.  He will call us back to let us know and is aware that if no pharmacy nearby has Oxycodone in stock, that Dr Dettinger can change his Rx to Hydrocodone, which is similar to Oxycodone, but not as strong.  Pt voiced understanding and will let us know.

## 2021-06-23 NOTE — Telephone Encounter (Signed)
Pt has called pharmacies and do not OXYCODONE in stock. What else can he do? Please call back

## 2021-06-23 NOTE — Addendum Note (Signed)
Addended by: Caryl Pina on: 06/23/2021 04:58 PM   Modules accepted: Orders

## 2021-07-22 ENCOUNTER — Telehealth: Payer: Self-pay | Admitting: Family Medicine

## 2021-07-22 ENCOUNTER — Other Ambulatory Visit: Payer: Self-pay | Admitting: Family Medicine

## 2021-07-22 DIAGNOSIS — M4712 Other spondylosis with myelopathy, cervical region: Secondary | ICD-10-CM

## 2021-07-22 MED ORDER — HYDROCODONE-ACETAMINOPHEN 7.5-325 MG PO TABS: 1.0000 | ORAL_TABLET | Freq: Four times a day (QID) | ORAL | 0 refills | Status: DC | PRN

## 2021-07-22 NOTE — Telephone Encounter (Signed)
Oxycodone is still out of stock and sent refill of hydrocodone for the patient

## 2021-07-22 NOTE — Telephone Encounter (Signed)
Pt normally takes oxycodone but [facility] has been out of stock. One refill of hydrocodone was sent last month in place of the oxycodone. Pt states that he called [facility] and they still do not have oxycodone in stock. Pt would like a refill of the hydrocodone. Made pt aware that this refill if approved would take the place of the original oxycodone prescription. Pt understood and will check with pharmacy later today.  Dr. Warrick Parisian,  I spoke with [facility] today and they do not have the oxycodone in stock.

## 2021-07-22 NOTE — Telephone Encounter (Signed)
We sent in the hydrocodone just for a one-time fill, he should have prescriptions for the oxycodone at the pharmacy, let me know if it is still out of stock but he should still have oxycodone prescriptions at the pharmacy.  I assume that it is back in stock

## 2021-08-19 ENCOUNTER — Telehealth: Payer: Self-pay | Admitting: Family Medicine

## 2021-08-19 DIAGNOSIS — M4712 Other spondylosis with myelopathy, cervical region: Secondary | ICD-10-CM

## 2021-08-19 NOTE — Telephone Encounter (Signed)
Patient should have oxycodone on file, it is still out of stock?

## 2021-08-20 ENCOUNTER — Other Ambulatory Visit: Payer: Self-pay | Admitting: Family Medicine

## 2021-08-20 MED ORDER — HYDROCODONE-ACETAMINOPHEN 5-325 MG PO TABS: 1.0000 | ORAL_TABLET | Freq: Four times a day (QID) | ORAL | 0 refills | Status: DC | PRN

## 2021-08-20 MED ORDER — HYDROCODONE-ACETAMINOPHEN 7.5-325 MG PO TABS: 1.0000 | ORAL_TABLET | Freq: Four times a day (QID) | ORAL | 0 refills | Status: DC | PRN

## 2021-08-20 NOTE — Progress Notes (Unsigned)
We will send hydrocodone fives because he is out of stock of the 7.5 dose

## 2021-08-20 NOTE — Telephone Encounter (Signed)
Pharmacy called stating they are now out of 7.5-325 of Hydrocodone. Only have 10-325 and 5-325 in stock.

## 2021-08-20 NOTE — Telephone Encounter (Signed)
Yes that was the question I was asking is if they were still out of stock with oxycodone because it is now been over 2 months since they have been out of stock so I was just double checking. Sent for him

## 2021-08-20 NOTE — Telephone Encounter (Signed)
Pharmacy called to see why Rx refill was denied. Reviewed providers note with them and they confirmed that they are out of stock on the oxycodone and hardly ever have it in stock and says pt was prescribed hydrocodone the last time which is why they are requesting refill on it and not the oxycodone.

## 2021-08-20 NOTE — Addendum Note (Signed)
Addended by: Caryl Pina on: 08/20/2021 04:01 PM   Modules accepted: Orders

## 2021-08-20 NOTE — Telephone Encounter (Signed)
Pt is calling about this message. Aware Dettinger will address and nurse will call back.  Pt says that tylenol is not helping at all.

## 2021-09-07 NOTE — Progress Notes (Signed)
Cardiology Office Note:    Date:  09/10/2021   ID:  [name], [name], 1947, MRN [id number]  PCP:  Dettinger, Fransisca Kaufmann, MD   [facility] Providers Cardiologist:  Parnell Spieler [place], MD     Referring MD: Dettinger, Fransisca Kaufmann, MD   Chief Complaint  Patient presents with   Follow-up   Coronary Artery Disease     History of Present Illness:    [name] is a 76 y.o. male with a hx of CAD, hyperlipidemia, hypothyroidism, hypertension and history of obstructive sleep apnea.  He underwent stenting of OM 3 with bare-metal stent in 2001.  Repeat cardiac catheterization in 2003 revealed no significant obstructive disease.  He underwent stenting of the proximal left circumflex with Taxus stent in 2007.  Cardiac catheterization in 2010 demonstrated 70% disease in RCA, patent stent, he was treated medically.  Myoview in November 2014 still showed a small lateral fixed defect consistent with scar or artifact, no ischemia, EF 59%.  Last cardiac catheterization in May 2016 showed nonobstructive disease, patent stent.  Last echocardiogram obtained on 07/26/2018 showed EF 56%, large defect of moderate severity present in the basal inferoseptal, basal inferior, mid inferoseptal, mid inferior, apical septal, and apical inferior location, suspect attenuation artifact, no reversible ischemia.  Overall low risk study.  He follows Dr. Lake Bells for COPD and emphysema.    On follow up today he is doing well. Denies any chest pain. Breathing is stable. No palpitations or edema. Hasn't had to use Ntg. Stays active doing yard work and still does some carpentry work.   Past Medical History:  Diagnosis Date   Anginal pain ([place])    Arthritis    OSTEO BACK KNEES HIPS & HANDS   Arthropathy, unspecified, site unspecified    Chronic airway obstruction, not elsewhere classified    Coronary atherosclerosis of unspecified type of vessel, native or graft    a. [name] 5/16:  mLAD 20%, CFX stent ok, OM3 stent ok, mRCA  30%, normal LVF   Headache(784.0)    Hx of echocardiogram    a. Echo 5/16:  mild LVH, EF 60%, mildly dilated Ao root (40 mm)   Hypothyroidism    Other and unspecified hyperlipidemia    Thyroid disease    hypothyroid   Unspecified essential hypertension    Unspecified sleep apnea    Dr. Jacelyn Grip at [facility] told pt he had sleep apnea, but never sent him for a sleep study so he doesn't wear a CPAP    Past Surgical History:  Procedure Laterality Date   ANKLE SURGERY     BALLOON DILATION N/A 05/02/2013   Procedure: BALLOON DILATION;  Surgeon: Rogene Houston, MD;  [facility];  Service: Endoscopy;  Laterality: N/A;   BIOPSY  07/17/2020   Procedure: BIOPSY;  Surgeon: Harvel Quale, MD;  [facility];  Service: Gastroenterology;;   CARDIAC CATHETERIZATION  01/15/2013   CARDIAC CATHETERIZATION N/A 05/16/2014   Procedure: Left Heart Cath and Coronary Angiography;  Surgeon: Pam Vanalstine M Martinique, MD;  Location: [facility] [facility];  Service: Cardiovascular;  Laterality: N/A;   COLONOSCOPY N/A 05/02/2013   Procedure: COLONOSCOPY;  Surgeon: Rogene Houston, MD;  [facility];  Service: Endoscopy;  Laterality: N/A;  200-moved to 1200 Ann notified pt   COLONOSCOPY WITH PROPOFOL N/A 05/08/2020   Procedure: COLONOSCOPY WITH PROPOFOL;  Surgeon: Harvel Quale, MD;  [facility];  Service: Gastroenterology;  Laterality: N/A;  AM  COLONOSCOPY WITH PROPOFOL N/A 06/19/2020   Procedure: COLONOSCOPY WITH PROPOFOL;  Surgeon: Harvel Quale, MD;  [facility];  Service: Gastroenterology;  Laterality: N/A;  [id number]   COLONOSCOPY WITH PROPOFOL N/A 07/17/2020   Procedure: COLONOSCOPY WITH PROPOFOL;  Surgeon: Harvel Quale, MD;  [facility];  Service: Gastroenterology;  Laterality: N/A;  9:00   CORONARY STENT PLACEMENT     2003, 2004   ESOPHAGOGASTRODUODENOSCOPY N/A 05/02/2013   Procedure:  ESOPHAGOGASTRODUODENOSCOPY (EGD);  Surgeon: Rogene Houston, MD;  [facility];  Service: Endoscopy;  Laterality: N/A;   HEMORRHOID SURGERY  2022   HEMOSTASIS CLIP PLACEMENT  07/17/2020   Procedure: HEMOSTASIS CLIP PLACEMENT;  Surgeon: Harvel Quale, MD;  [facility];  Service: Gastroenterology;;   HIP ARTHROPLASTY     KNEE SURGERY Left 12/03/2013   LEFT HEART CATHETERIZATION WITH CORONARY ANGIOGRAM N/A 01/15/2013   Procedure: LEFT HEART CATHETERIZATION WITH CORONARY ANGIOGRAM;  Surgeon: Sinclair Grooms, MD;  Location: [facility];  Service: Cardiovascular;  Laterality: N/A;   MALONEY DILATION N/A 05/02/2013   Procedure: Venia Minks DILATION;  Surgeon: Rogene Houston, MD;  [facility];  Service: Endoscopy;  Laterality: N/A;   POLYPECTOMY  07/17/2020   Procedure: POLYPECTOMY;  Surgeon: Harvel Quale, MD;  [facility];  Service: Gastroenterology;;   [name] DILATION N/A 05/02/2013   Procedure: [name] DILATION;  Surgeon: Rogene Houston, MD;  [facility];  Service: Endoscopy;  Laterality: N/A;   TONSILLECTOMY      Current Medications: Current Meds  Medication Sig   albuterol (PROAIR HFA) 108 (90 BASE) MCG/ACT inhaler Inhale 2 puffs into the lungs every 6 (six) hours as needed for wheezing or shortness of breath.   amLODipine (NORVASC) 10 MG tablet Take 1 tablet (10 mg total) by mouth daily.   aspirin EC 81 MG tablet Take 1 tablet (81 mg total) by mouth daily.   Cyanocobalamin (VITAMIN B-12 PO) Take 2,500 mcg by mouth daily.   HYDROcodone-acetaminophen (NORCO/VICODIN) 5-325 MG tablet Take 1 tablet by mouth every 6 (six) hours as needed for moderate pain.   levothyroxine (SYNTHROID) 200 MCG tablet Take 1 tablet (200 mcg total) by mouth daily.   lisinopril (ZESTRIL) 20 MG tablet Take 1 tablet (20 mg total) by mouth daily.   Multiple Vitamin (MULTIVITAMIN WITH MINERALS) TABS tablet Take 1 tablet by mouth daily.    naloxegol oxalate (MOVANTIK) 12.5 MG TABS tablet Take 1 tablet (12.5 mg total) by mouth daily.   oxyCODONE-acetaminophen (PERCOCET) 7.5-325 MG tablet Take 1 tablet by mouth every 6 (six) hours as needed for severe pain.   sertraline (ZOLOFT) 50 MG tablet Take 1 tablet (50 mg total) by mouth daily.   sildenafil (REVATIO) 20 MG tablet Take 1-3 tablets (20-60 mg total) by mouth as needed. (Patient taking differently: Take 20-60 mg by mouth as needed (ed).)   simvastatin (ZOCOR) 40 MG tablet Take 1 tablet (40 mg total) by mouth at bedtime.   [DISCONTINUED] nitroGLYCERIN (NITROSTAT) 0.4 MG SL tablet Place 0.4 mg under the tongue every 5 (five) minutes as needed for chest pain.   [DISCONTINUED] oxyCODONE-acetaminophen (PERCOCET) 7.5-325 MG tablet Take 1 tablet by mouth every 6 (six) hours as needed for severe pain.   [DISCONTINUED] oxyCODONE-acetaminophen (PERCOCET) 7.5-325 MG tablet Take 1 tablet by mouth every 6 (six) hours as needed for severe pain.     Allergies:   Patient has no known allergies.  Social History   Socioeconomic History   Marital status: Widowed    Spouse name: Not on file   Number of children: 2   Years of education: Not on file   Highest education level: 11th grade  Occupational History   Occupation: Company secretary: Journalist, newspaper   Occupation: Retired   Tobacco Use   Smoking status: Former    Packs/day: 3.00    Years: 20.00    Total pack years: 60.00    Types: Cigarettes    Quit date: 05/26/1975    Years since quitting: 46.3   Smokeless tobacco: Current    Types: Chew  Vaping Use   Vaping Use: Never used  Substance and Sexual Activity   Alcohol use: No   Drug use: No   Sexual activity: Not Currently  Other Topics Concern   Not on file  Social History Narrative   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 - 2 children one boy,one girl.    Son is living with him right now - 2023   Social Determinants of  Health   Financial Resource Strain: Low Risk  (04/14/2021)   Overall Financial Resource Strain (CARDIA)    Difficulty of Paying Living Expenses: Not hard at all  Food Insecurity: No Food Insecurity (04/14/2021)   Hunger Vital Sign    Worried About Running Out of Food in the Last Year: Never true    Ran Out of Food in the Last Year: Never true  Transportation Needs: No Transportation Needs (04/14/2021)   PRAPARE - Hydrologist (Medical): No    Lack of Transportation (Non-Medical): No  Physical Activity: Insufficiently Active (04/14/2021)   Exercise Vital Sign    Days of Exercise per Week: 3 days    Minutes of Exercise per Session: 40 min  Stress: No Stress Concern Present (04/14/2021)   [place]    Feeling of Stress : Not at all  Social Connections: Moderately Integrated (04/14/2021)   Social Connection and Isolation Panel [NHANES]    Frequency of Communication with Friends and Family: More than three times a week    Frequency of Social Gatherings with Friends and Family: More than three times a week    Attends Religious Services: More than 4 times per year    Active Member of [employer] or Organizations: Yes    Attends Archivist Meetings: More than 4 times per year    Marital Status: Widowed     Family History: The patient's family history includes CAD in his father; Stroke in his mother. There is no history of Colon cancer.  ROS:   Please see the history of present illness.     All other systems reviewed and are negative.  EKGs/Labs/Other Studies Reviewed:    The following studies were reviewed today:  Myoview 07/26/2018 The left ventricular ejection fraction is normal (55-65%). Nuclear stress EF: 56%. No T wave inversion was noted during stress. There was no ST segment deviation noted during stress. Defect 1: There is a large defect of moderate severity present in the basal  inferoseptal, basal inferior, mid inferoseptal, mid inferior, apical septal and apical inferior location. This is a low risk study.   Large size, moderate severity mostly fixed inferoseptal perfusion defect (SDS 2), suspect attenuation artifact. No reversible ischemia. LVEF 56% with normal wall motion. This is a low risk study.  EKG:  EKG  is ordered today.  The ekg ordered today demonstrates normal sinus rhythm, poor R wave progression in the anterior leads, unchanged when compared to the previous EKG from Sept 2022. I have personally reviewed and interpreted this study.   Recent Labs: 06/16/2021: ALT 14; BUN 18; Creatinine, Ser 0.96; Hemoglobin 13.6; Platelets 270; Potassium 4.4; Sodium 139; TSH 4.260  Recent Lipid Panel    Component Value Date/Time   CHOL 146 06/16/2021 1329   CHOL 160 04/16/2012 1647   TRIG 101 06/16/2021 1329   TRIG 250 (H) 04/04/2013 0952   TRIG 165 (H) 04/16/2012 1647   HDL 57 06/16/2021 1329   HDL 42 04/04/2013 0952   HDL 51 04/16/2012 1647   CHOLHDL 2.6 06/16/2021 1329   CHOLHDL 3.0 05/16/2014 0115   VLDL 16 05/16/2014 0115   LDLCALC 71 06/16/2021 1329   LDLCALC 53 04/04/2013 0952   LDLCALC 76 04/16/2012 1647   LDLDIRECT 70.0 04/Frey/2008 1050     Risk Assessment/Calculations:           Physical Exam:    VS:  BP 132/72   Pulse 71   Ht 6' (1.829 m)   Wt 222 lb (100.7 kg)   SpO2 98%   BMI 30.11 kg/m     Wt Readings from Last 3 Encounters:  Frey/08/23 222 lb (100.7 kg)  06/16/21 228 lb (103.4 kg)  04/14/21 233 lb (105.7 kg)     GEN:  Well nourished, well developed in no acute distress HEENT: Normal NECK: No JVD; No carotid bruits LYMPHATICS: No lymphadenopathy CARDIAC: RRR, no murmurs, rubs, gallops RESPIRATORY:  Clear to auscultation without rales, wheezing or rhonchi  ABDOMEN: Soft, non-tender, non-distended MUSCULOSKELETAL:  No edema; No deformity  SKIN: Warm and dry NEUROLOGIC:  Alert and oriented x 3 PSYCHIATRIC:  Normal affect    ASSESSMENT:    1. Coronary artery disease involving native coronary artery of native heart without angina pectoris   2. Essential hypertension   3. Hyperlipidemia LDL goal <70     PLAN:    In order of problems listed above:  CAD: Remote stenting of OM in 2001 and proximal LCx in 2007. Nonobstructive disease on cath in 2016. Denies any recent chest pain.  Continue on aspirin and a statin  Hypertension: Blood pressure well controlled  Hyperlipidemia: On Zocor 40 mg daily. LDL at goal 71.   Hypothyroidism: On levothyroxine.  Managed by primary care provider. TSH normal.    Will continue current therapy. Follow up in one year.    Medication Adjustments/Labs and Tests Ordered: Current medicines are reviewed at length with the patient today.  Concerns regarding medicines are outlined above.  No orders of the defined types were placed in this encounter.   Meds ordered this encounter  Medications   nitroGLYCERIN (NITROSTAT) 0.4 MG SL tablet    Sig: Place 1 tablet (0.4 mg total) under the tongue every 5 (five) minutes as needed for chest pain.    Dispense:  25 tablet    Refill:  3     There are no Patient Instructions on file for this visit.   Signed, Jodeen Mclin Martin[place], MD  09/10/2021 8:06 AM    [facility]

## 2021-09-10 ENCOUNTER — Encounter: Payer: Self-pay | Admitting: Cardiology

## 2021-09-10 ENCOUNTER — Ambulatory Visit: Payer: Medicare Other | Attending: Cardiology | Admitting: Cardiology

## 2021-09-10 VITALS — BP 132/72 | HR 71 | Ht 72.0 in | Wt 222.0 lb

## 2021-09-10 DIAGNOSIS — I10 Essential (primary) hypertension: Secondary | ICD-10-CM | POA: Diagnosis not present

## 2021-09-10 DIAGNOSIS — E785 Hyperlipidemia, unspecified: Secondary | ICD-10-CM

## 2021-09-10 DIAGNOSIS — I2510 Atherosclerotic heart disease of native coronary artery without angina pectoris: Secondary | ICD-10-CM | POA: Diagnosis not present

## 2021-09-10 MED ORDER — NITROGLYCERIN 0.4 MG SL SUBL: 0.4000 mg | SUBLINGUAL_TABLET | SUBLINGUAL | 3 refills | Status: DC | PRN

## 2021-09-10 NOTE — Addendum Note (Signed)
Addended by: Kathyrn Lass on: 09/10/2021 08:10 AM   Modules accepted: Orders

## 2021-09-17 ENCOUNTER — Ambulatory Visit (INDEPENDENT_AMBULATORY_CARE_PROVIDER_SITE_OTHER): Payer: Medicare Other | Admitting: Family Medicine

## 2021-09-17 ENCOUNTER — Encounter: Payer: Self-pay | Admitting: Family Medicine

## 2021-09-17 DIAGNOSIS — G8929 Other chronic pain: Secondary | ICD-10-CM | POA: Diagnosis not present

## 2021-09-17 DIAGNOSIS — M4712 Other spondylosis with myelopathy, cervical region: Secondary | ICD-10-CM

## 2021-09-17 DIAGNOSIS — Z0289 Encounter for other administrative examinations: Secondary | ICD-10-CM

## 2021-09-17 MED ORDER — OXYCODONE-ACETAMINOPHEN 7.5-325 MG PO TABS: 1.0000 | ORAL_TABLET | Freq: Four times a day (QID) | ORAL | 0 refills | Status: DC | PRN

## 2021-09-17 NOTE — Progress Notes (Signed)
BP 132/64   Pulse 71   Temp 98 F (36.7 C)   Ht 6' (1.829 m)   Wt 220 lb (99.8 kg)   SpO2 99%   BMI 29.84 kg/m    Subjective:   Patient ID: [name], male    DOB: 08-Feb-1945, 76 y.o.   MRN: [id number]  HPI: CARON TARDIF is a 76 y.o. male presenting on 09/17/2021 for Medical Management of Chronic Issues   HPI Pain assessment: Cause  of pain- osteoarthritis of cervical spine with myelopathy and arthritis of the knee Pain location-neck and lower back and knees Pain on scale of 1-10- 5 Frequency-Daily What increases pain-weather changes and being on his feet What makes pain Better-medication and rest Effects on ADL -minimal Any change in general medical condition-none  Current opioids rx-Percocet 7.5 325 # meds rx-120/month Effectiveness of current meds-works well, its been out of stock so has had hydrocodone which has not been insufficient Adverse reactions from pain meds-none Morphine equivalent-45  Pill count performed-No Last drug screen -06/26/2021 ( high risk q80m moderate risk q656mlow risk yearly ) Urine drug [place] today- Yes [place]  If yes were their any concerning findings? -None     03/27/2019    8:56 AM  Opioid Risk   Alcohol 0  Illegal Drugs 0  Rx Drugs 0  Alcohol 0  Illegal Drugs 0  Rx Drugs 0  Age between 16-45 years  0  History of Preadolescent Sexual Abuse 0  Psychological Disease 0  Depression 1  Opioid Risk Tool Scoring 1  Opioid Risk Interpretation Low Risk   Pain contract signed on: 06/26/2021  Relevant past medical, surgical, family and social history reviewed and updated as indicated. Interim medical history since our last visit reviewed. Allergies and medications reviewed and updated.  Review of Systems  Constitutional:  Negative for chills and fever.  Eyes:  Negative for visual disturbance.  Respiratory:  Negative for shortness of breath and wheezing.   Cardiovascular:  Negative for chest pain and  leg swelling.  Musculoskeletal:  Positive for arthralgias, back pain and neck pain. Negative for gait problem.  Skin:  Negative for rash.  All other systems reviewed and are negative.   Per HPI unless specifically indicated above   Allergies as of 09/17/2021   No Known Allergies      Medication List        Accurate as of September 17, 2021  8:47 AM. If you have any questions, ask your nurse or doctor.          STOP taking these medications    HYDROcodone-acetaminophen 5-325 MG tablet Commonly known as: NORCO/VICODIN Stopped by: JoFransisca Kaufmannettinger, MD       TAKE these medications    albuterol 108 (90 Base) MCG/ACT inhaler Commonly known as: ProAir HFA Inhale 2 puffs into the lungs every 6 (six) hours as needed for wheezing or shortness of breath.   amLODipine 10 MG tablet Commonly known as: NORVASC Take 1 tablet (10 mg total) by mouth daily.   aspirin EC 81 MG tablet Take 1 tablet (81 mg total) by mouth daily.   levothyroxine 200 MCG tablet Commonly known as: SYNTHROID Take 1 tablet (200 mcg total) by mouth daily.   lisinopril 20 MG tablet Commonly known as: ZESTRIL Take 1 tablet (20 mg total) by mouth daily.   multivitamin with minerals Tabs tablet Take 1 tablet by mouth daily.   naloxegol oxalate 12.5 MG Tabs tablet  Commonly known as: Movantik Take 1 tablet (12.5 mg total) by mouth daily.   nitroGLYCERIN 0.4 MG SL tablet Commonly known as: NITROSTAT Place 1 tablet (0.4 mg total) under the tongue every 5 (five) minutes as needed for chest pain.   oxyCODONE-acetaminophen 7.5-325 MG tablet Commonly known as: PERCOCET Take 1 tablet by mouth every 6 (six) hours as needed for severe pain. Start taking on: October 16, 2021 What changed: You were already taking a medication with the same name, and this prescription was added. Make sure you understand how and when to take each. Changed by: Worthy Rancher, MD   oxyCODONE-acetaminophen 7.5-325 MG  tablet Commonly known as: PERCOCET Take 1 tablet by mouth every 6 (six) hours as needed for severe pain. Start taking on: November 16, 2021 What changed: These instructions start on November 16, 2021. If you are unsure what to do until then, ask your doctor or other care provider. Changed by: Fransisca Kaufmann Subrina Vecchiarelli, MD   sertraline 50 MG tablet Commonly known as: Zoloft Take 1 tablet (50 mg total) by mouth daily.   sildenafil 20 MG tablet Commonly known as: REVATIO Take 1-3 tablets (20-60 mg total) by mouth as needed. What changed: reasons to take this   simvastatin 40 MG tablet Commonly known as: ZOCOR Take 1 tablet (40 mg total) by mouth at bedtime.   VITAMIN B-12 PO Take 2,500 mcg by mouth daily.         Objective:   BP 132/64   Pulse 71   Temp 98 F (36.7 C)   Ht 6' (1.829 m)   Wt 220 lb (99.8 kg)   SpO2 99%   BMI 29.84 kg/m   Wt Readings from Last 3 Encounters:  09/17/21 220 lb (99.8 kg)  09/10/21 222 lb (100.7 kg)  06/16/21 228 lb (103.4 kg)    Physical Exam Vitals and nursing note reviewed.  Constitutional:      General: He is not in acute distress.    Appearance: He is well-developed. He is not diaphoretic.  Eyes:     General: No scleral icterus.    Conjunctiva/sclera: Conjunctivae normal.  Neck:     Thyroid: No thyromegaly.  Cardiovascular:     Rate and Rhythm: Normal rate and regular rhythm.     Heart sounds: Normal heart sounds. No murmur heard. Pulmonary:     Effort: Pulmonary effort is normal. No respiratory distress.     Breath sounds: Normal breath sounds. No wheezing.  Musculoskeletal:        General: No swelling. Normal range of motion.     Cervical back: Neck supple.  Lymphadenopathy:     Cervical: No cervical adenopathy.  Skin:    General: Skin is warm and dry.     Findings: No rash.  Neurological:     Mental Status: He is alert and oriented to person, place, and time.     Coordination: Coordination normal.  Psychiatric:         Behavior: Behavior normal.       Assessment & Plan:   Problem List Items Addressed This Visit       Nervous and Auditory   Osteoarthritis of cervical spine with myelopathy   Relevant Medications   oxyCODONE-acetaminophen (PERCOCET) 7.5-325 MG tablet (Start on 11/16/2021)   oxyCODONE-acetaminophen (PERCOCET) 7.5-325 MG tablet (Start on 10/16/2021)     Other   Pain management contract signed   Relevant Medications   oxyCODONE-acetaminophen (PERCOCET) 7.5-325 MG tablet (Start on 11/16/2021)  oxyCODONE-acetaminophen (PERCOCET) 7.5-325 MG tablet (Start on 10/16/2021)   Other Visit Diagnoses     Encounter for chronic pain management       Relevant Medications   oxyCODONE-acetaminophen (PERCOCET) 7.5-325 MG tablet (Start on 11/16/2021)   oxyCODONE-acetaminophen (PERCOCET) 7.5-325 MG tablet (Start on 10/16/2021)       Continue current medicine, he states the pharmacy he just called him during the visit and they have 1 prescription already filled and will send tomorrow to get his 52-monthprescriptions Follow up plan: Return in about 3 months (around 12/17/2021), or if symptoms worsen or fail to improve, for Pain management and recheck labs.  Counseling provided for all of the vaccine components No orders of the defined types were placed in this encounter.   JCaryl Pina MD [facility] 09/17/2021, 8:47 AM

## 2021-09-29 ENCOUNTER — Other Ambulatory Visit: Payer: Self-pay | Admitting: Family Medicine

## 2021-12-17 ENCOUNTER — Other Ambulatory Visit: Payer: Self-pay | Admitting: Family Medicine

## 2021-12-17 ENCOUNTER — Ambulatory Visit (INDEPENDENT_AMBULATORY_CARE_PROVIDER_SITE_OTHER): Payer: Medicare Other | Admitting: Family Medicine

## 2021-12-17 ENCOUNTER — Encounter: Payer: Self-pay | Admitting: Family Medicine

## 2021-12-17 VITALS — BP 124/73 | HR 64 | Temp 98.0°F | Ht 72.0 in | Wt 223.0 lb

## 2021-12-17 DIAGNOSIS — Z0289 Encounter for other administrative examinations: Secondary | ICD-10-CM

## 2021-12-17 DIAGNOSIS — G8929 Other chronic pain: Secondary | ICD-10-CM | POA: Diagnosis not present

## 2021-12-17 DIAGNOSIS — I10 Essential (primary) hypertension: Secondary | ICD-10-CM

## 2021-12-17 DIAGNOSIS — E079 Disorder of thyroid, unspecified: Secondary | ICD-10-CM | POA: Diagnosis not present

## 2021-12-17 DIAGNOSIS — M4712 Other spondylosis with myelopathy, cervical region: Secondary | ICD-10-CM | POA: Diagnosis not present

## 2021-12-17 DIAGNOSIS — E782 Mixed hyperlipidemia: Secondary | ICD-10-CM

## 2021-12-17 MED ORDER — OXYCODONE-ACETAMINOPHEN 7.5-325 MG PO TABS: 1.0000 | ORAL_TABLET | Freq: Four times a day (QID) | ORAL | 0 refills | Status: DC | PRN

## 2021-12-17 NOTE — Progress Notes (Signed)
BP 124/73   Pulse 64   Temp 98 F (36.7 C)   Ht 6' (1.829 m)   Wt 223 lb (101.2 kg)   SpO2 96%   BMI 30.24 kg/m    Subjective:   Patient ID: [name], male    DOB: 02-Jan-1946, 76 y.o.   MRN: [id number]  HPI: [name] is a 76 y.o. male presenting on 12/17/2021 for Medical Management of Chronic Issues, Hyperlipidemia, Hypertension, and Hypothyroidism   HPI Hypothyroidism recheck Patient is coming in for thyroid recheck today as well. They deny any issues with hair changes or heat or cold problems or diarrhea or constipation. They deny any chest pain or palpitations. They are currently on levothyroxine 200 micrograms   Hyperlipidemia Patient is coming in for recheck of his hyperlipidemia. The patient is currently taking simvastatin. They deny any issues with myalgias or history of liver damage from it. They deny any focal numbness or weakness or chest pain.   Hypertension Patient is currently on lisinopril and amlodipine, and their blood pressure today is 124/73. Patient denies any lightheadedness or dizziness. Patient denies headaches, blurred vision, chest pains, shortness of breath, or weakness. Denies any side effects from medication and is content with current medication.   Pain assessment: Cause of pain-osteoarthritis of cervical spine and lumbar spine and knee Pain location-neck back and knees Pain on scale of 1-10- 5 Frequency-daily What increases pain-weather changes or increased activity What makes pain Better-medication and rest Effects on ADL -minimal Any change in general medical condition-none  Current opioids rx-Percocet 7.5-325 4 times daily as needed # meds rx-120/month Effectiveness of current meds-works well Adverse reactions from pain meds-none Morphine equivalent-45  Pill count performed-No Last drug screen -06/16/2021 ( high risk q60m moderate risk q674mlow risk yearly ) Urine drug screen today- No [place]  If  yes were their any concerning findings? -None     03/27/2019    8:56 AM  Opioid Risk   Alcohol 0  Illegal Drugs 0  Rx Drugs 0  Alcohol 0  Illegal Drugs 0  Rx Drugs 0  Age between 16-45 years  0  History of Preadolescent Sexual Abuse 0  Psychological Disease 0  Depression 1  Opioid Risk Tool Scoring 1  Opioid Risk Interpretation Low Risk   Pain contract signed on: 06/16/2021, redid contract today because he did not get scanned inappropriately  Relevant past medical, surgical, family and social history reviewed and updated as indicated. Interim medical history since our last visit reviewed. Allergies and medications reviewed and updated.  Review of Systems  Constitutional:  Negative for chills and fever.  Eyes:  Negative for visual disturbance.  Respiratory:  Negative for shortness of breath and wheezing.   Cardiovascular:  Negative for chest pain and leg swelling.  Musculoskeletal:  Positive for arthralgias, back pain, myalgias and neck pain. Negative for gait problem.  Skin:  Negative for rash.  Neurological:  Negative for dizziness.  All other systems reviewed and are negative.   Per HPI unless specifically indicated above   Allergies as of 12/17/2021   No Known Allergies      Medication List        Accurate as of December 17, 2021 11:45 AM. If you have any questions, ask your nurse or doctor.          albuterol 108 (90 Base) MCG/ACT inhaler Commonly known as: ProAir HFA Inhale 2 puffs into the lungs every 6 (six) hours as  needed for wheezing or shortness of breath.   amLODipine 10 MG tablet Commonly known as: NORVASC Take 1 tablet (10 mg total) by mouth daily.   aspirin EC 81 MG tablet Take 1 tablet (81 mg total) by mouth daily.   levothyroxine 200 MCG tablet Commonly known as: SYNTHROID Take 1 tablet (200 mcg total) by mouth daily.   lisinopril 20 MG tablet Commonly known as: ZESTRIL Take 1 tablet (20 mg total) by mouth daily.   multivitamin  with minerals Tabs tablet Take 1 tablet by mouth daily.   naloxegol oxalate 12.5 MG Tabs tablet Commonly known as: Movantik Take 1 tablet (12.5 mg total) by mouth daily.   nitroGLYCERIN 0.4 MG SL tablet Commonly known as: NITROSTAT Place 1 tablet (0.4 mg total) under the tongue every 5 (five) minutes as needed for chest pain.   oxyCODONE-acetaminophen 7.5-325 MG tablet Commonly known as: PERCOCET Take 1 tablet by mouth every 6 (six) hours as needed for severe pain. What changed:  Another medication with the same name was added. Make sure you understand how and when to take each. Another medication with the same name was changed. Make sure you understand how and when to take each. Changed by: Worthy Rancher, MD   oxyCODONE-acetaminophen 7.5-325 MG tablet Commonly known as: PERCOCET Take 1 tablet by mouth every 6 (six) hours as needed for severe pain. Start taking on: January 16, 2022 What changed: These instructions start on January 16, 2022. If you are unsure what to do until then, ask your doctor or other care provider. Changed by: Worthy Rancher, MD   oxyCODONE-acetaminophen 7.5-325 MG tablet Commonly known as: PERCOCET Take 1 tablet by mouth every 6 (six) hours as needed for severe pain. Start taking on: February 15, 2022 What changed: You were already taking a medication with the same name, and this prescription was added. Make sure you understand how and when to take each. Changed by: Fransisca Kaufmann Fallen Crisostomo, MD   sertraline 50 MG tablet Commonly known as: Zoloft Take 1 tablet (50 mg total) by mouth daily.   sildenafil 20 MG tablet Commonly known as: REVATIO TAKE UP TO 3 TABLETS AS DIRECTED WHEN NEEDED   simvastatin 40 MG tablet Commonly known as: ZOCOR Take 1 tablet (40 mg total) by mouth at bedtime.   VITAMIN B-12 PO Take 2,500 mcg by mouth daily.         Objective:   BP 124/73   Pulse 64   Temp 98 F (36.7 C)   Ht 6' (1.829 m)   Wt 223 lb (101.2  kg)   SpO2 96%   BMI 30.24 kg/m   Wt Readings from Last 3 Encounters:  12/17/21 223 lb (101.2 kg)  09/17/21 220 lb (99.8 kg)  09/10/21 222 lb (100.7 kg)    Physical Exam Vitals and nursing note reviewed.  Constitutional:      General: He is not in acute distress.    Appearance: He is well-developed. He is not diaphoretic.  Eyes:     General: No scleral icterus.    Conjunctiva/sclera: Conjunctivae normal.  Neck:     Thyroid: No thyromegaly.  Cardiovascular:     Rate and Rhythm: Normal rate and regular rhythm.     Heart sounds: Normal heart sounds. No murmur heard. Pulmonary:     Effort: Pulmonary effort is normal. No respiratory distress.     Breath sounds: Normal breath sounds. No wheezing.  Musculoskeletal:        General: Normal  range of motion.     Cervical back: Neck supple.  Lymphadenopathy:     Cervical: No cervical adenopathy.  Skin:    General: Skin is warm and dry.     Findings: No rash.  Neurological:     Mental Status: He is alert and oriented to person, place, and time.     Coordination: Coordination normal.  Psychiatric:        Behavior: Behavior normal.       Assessment & Plan:   Problem List Items Addressed This Visit       Cardiovascular and Mediastinum   Essential hypertension - Primary   Relevant Orders   CBC with Differential/Platelet   CMP14+EGFR   Lipid panel   Thyroid Panel With TSH     Endocrine   Thyroid disease   Relevant Orders   CBC with Differential/Platelet   CMP14+EGFR   Lipid panel   Thyroid Panel With TSH     Nervous and Auditory   Osteoarthritis of cervical spine with myelopathy   Relevant Medications   oxyCODONE-acetaminophen (PERCOCET) 7.5-325 MG tablet (Start on 01/16/2022)   oxyCODONE-acetaminophen (PERCOCET) 7.5-325 MG tablet   oxyCODONE-acetaminophen (PERCOCET) 7.5-325 MG tablet (Start on 02/15/2022)     Other   Hyperlipidemia   Relevant Orders   CBC with Differential/Platelet   CMP14+EGFR   Lipid panel    Thyroid Panel With TSH   Pain management contract signed   Relevant Medications   oxyCODONE-acetaminophen (PERCOCET) 7.5-325 MG tablet (Start on 01/16/2022)   oxyCODONE-acetaminophen (PERCOCET) 7.5-325 MG tablet   oxyCODONE-acetaminophen (PERCOCET) 7.5-325 MG tablet (Start on 02/15/2022)   Other Visit Diagnoses     Encounter for chronic pain management       Relevant Medications   oxyCODONE-acetaminophen (PERCOCET) 7.5-325 MG tablet (Start on 01/16/2022)   oxyCODONE-acetaminophen (PERCOCET) 7.5-325 MG tablet   oxyCODONE-acetaminophen (PERCOCET) 7.5-325 MG tablet (Start on 02/15/2022)       Continue current medicine, no changes, blood pressure and everything looks good.  Will do blood work today. Follow up plan: Return in about 3 months (around 03/18/2022), or if symptoms worsen or fail to improve, for Pain medication recheck.  Counseling provided for all of the vaccine components Orders Placed This Encounter  Procedures   CBC with Differential/Platelet   CMP14+EGFR   Lipid panel   Thyroid Panel With TSH    Caryl Pina, MD [facility] 12/17/2021, 11:45 AM

## 2021-12-18 LAB — CMP14+EGFR
ALT: 13 IU/L (ref 0–44)
AST: 17 IU/L (ref 0–40)
Albumin/Globulin Ratio: 2 (ref 1.2–2.2)
Albumin: 4.3 g/dL (ref 3.8–4.8)
Alkaline Phosphatase: 56 IU/L (ref 44–121)
BUN/Creatinine Ratio: 13 (ref 10–24)
BUN: 12 mg/dL (ref 8–27)
Bilirubin Total: 0.3 mg/dL (ref 0.0–1.2)
CO2: 21 mmol/L (ref 20–29)
Calcium: 9.1 mg/dL (ref 8.6–10.2)
Chloride: 105 mmol/L (ref 96–106)
Creatinine, Ser: 0.92 mg/dL (ref 0.76–1.27)
Globulin, Total: 2.1 g/dL (ref 1.5–4.5)
Glucose: 93 mg/dL (ref 70–99)
Potassium: 4.5 mmol/L (ref 3.5–5.2)
Sodium: 142 mmol/L (ref 134–144)
Total Protein: 6.4 g/dL (ref 6.0–8.5)
eGFR: 86 mL/min/{1.73_m2} (ref >59)

## 2021-12-18 LAB — CBC WITH DIFFERENTIAL/PLATELET
Basophils Absolute: 0 10*3/uL (ref 0.0–0.2)
Basos: 0 %
EOS (ABSOLUTE): 0.1 10*3/uL (ref 0.0–0.4)
Eos: 2 %
Hematocrit: 38.2 % (ref 37.5–51.0)
Hemoglobin: 12.6 g/dL — ABNORMAL LOW (ref 13.0–17.7)
Immature Grans (Abs): 0 10*3/uL (ref 0.0–0.1)
Immature Granulocytes: 0 %
Lymphocytes Absolute: 1.8 10*3/uL (ref 0.7–3.1)
Lymphs: 25 %
MCH: 29.7 pg (ref 26.6–33.0)
MCHC: 33 g/dL (ref 31.5–35.7)
MCV: 90 fL (ref 79–97)
Monocytes Absolute: 0.8 10*3/uL (ref 0.1–0.9)
Monocytes: 11 %
Neutrophils Absolute: 4.5 10*3/uL (ref 1.4–7.0)
Neutrophils: 62 %
Platelets: 272 10*3/uL (ref 150–450)
RBC: 4.24 x10E6/uL (ref 4.14–5.80)
RDW: 14.1 % (ref 11.6–15.4)
WBC: 7.2 10*3/uL (ref 3.4–10.8)

## 2021-12-18 LAB — LIPID PANEL
Chol/HDL Ratio: 2.1 ratio (ref 0.0–5.0)
Cholesterol, Total: 131 mg/dL (ref 100–199)
HDL: 61 mg/dL (ref >39)
LDL Chol Calc (NIH): 56 mg/dL (ref 0–99)
Triglycerides: 66 mg/dL (ref 0–149)
VLDL Cholesterol Cal: 14 mg/dL (ref 5–40)

## 2021-12-18 LAB — THYROID PANEL WITH TSH
Free Thyroxine Index: 2.9 (ref 1.2–4.9)
T3 Uptake Ratio: 32 % (ref 24–39)
T4, Total: 9 ug/dL (ref 4.5–12.0)
TSH: 2.44 u[IU]/mL (ref 0.450–4.500)

## 2022-01-11 IMAGING — DX DG CERVICAL SPINE COMPLETE 4+V
5 series · 5 of 5 positions shown · non-contrast
Comparison: None.

CLINICAL DATA: Chronic neck pain.

EXAM:
CERVICAL SPINE - COMPLETE 4+ VIEW

[c-spine lat]
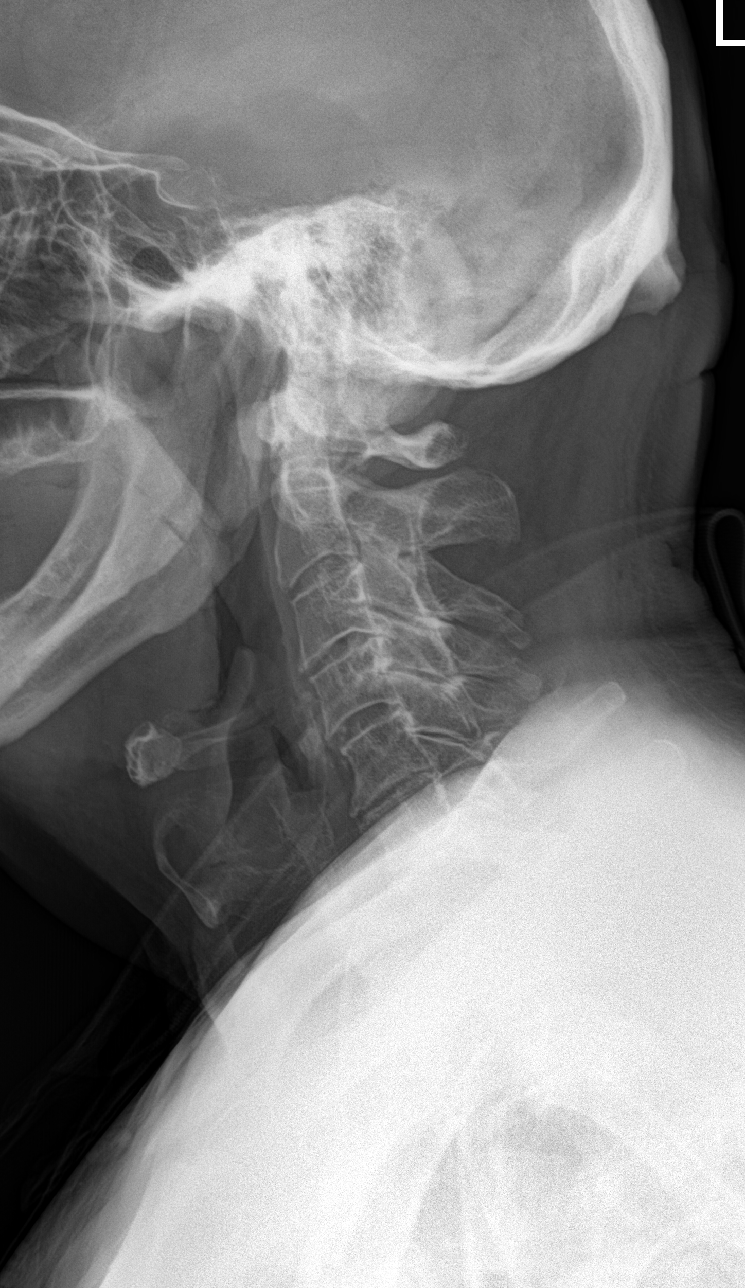

[c-spine obl (1 of 2)]
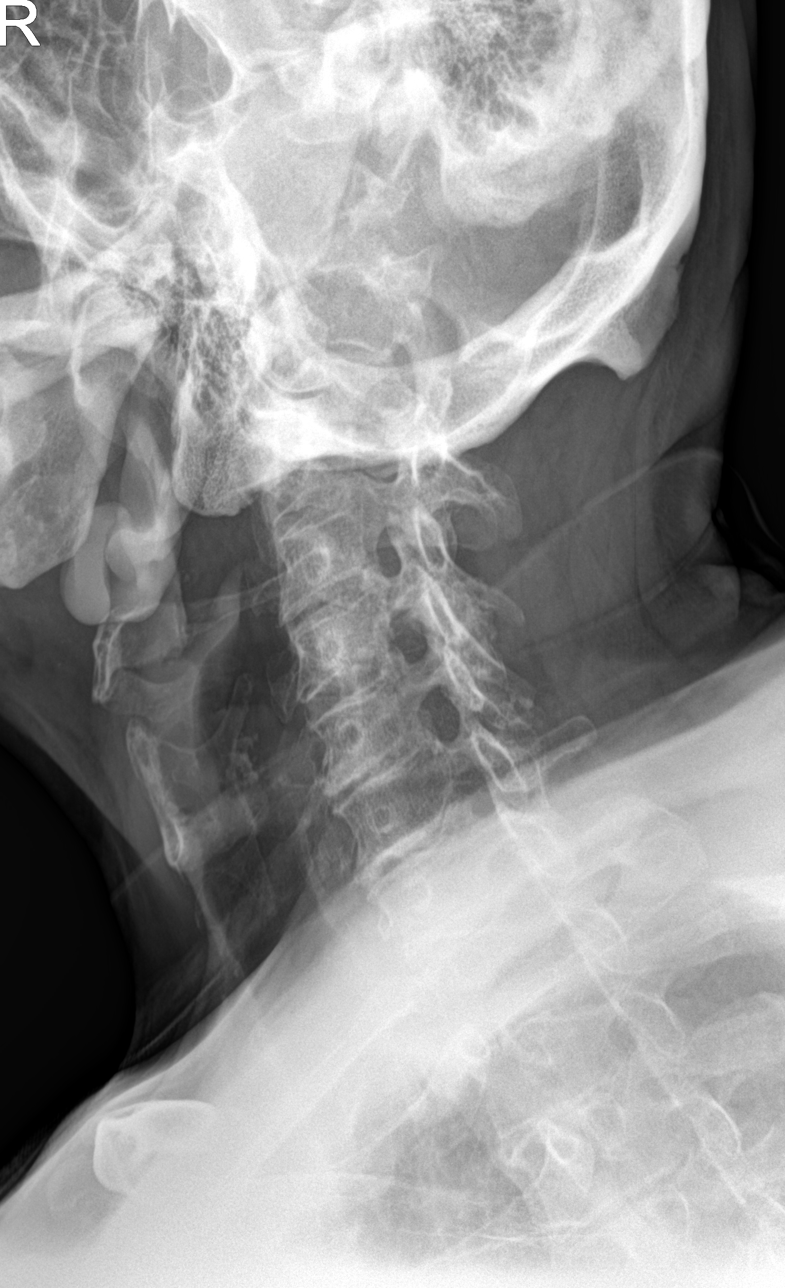

[c-spine obl (2 of 2)]
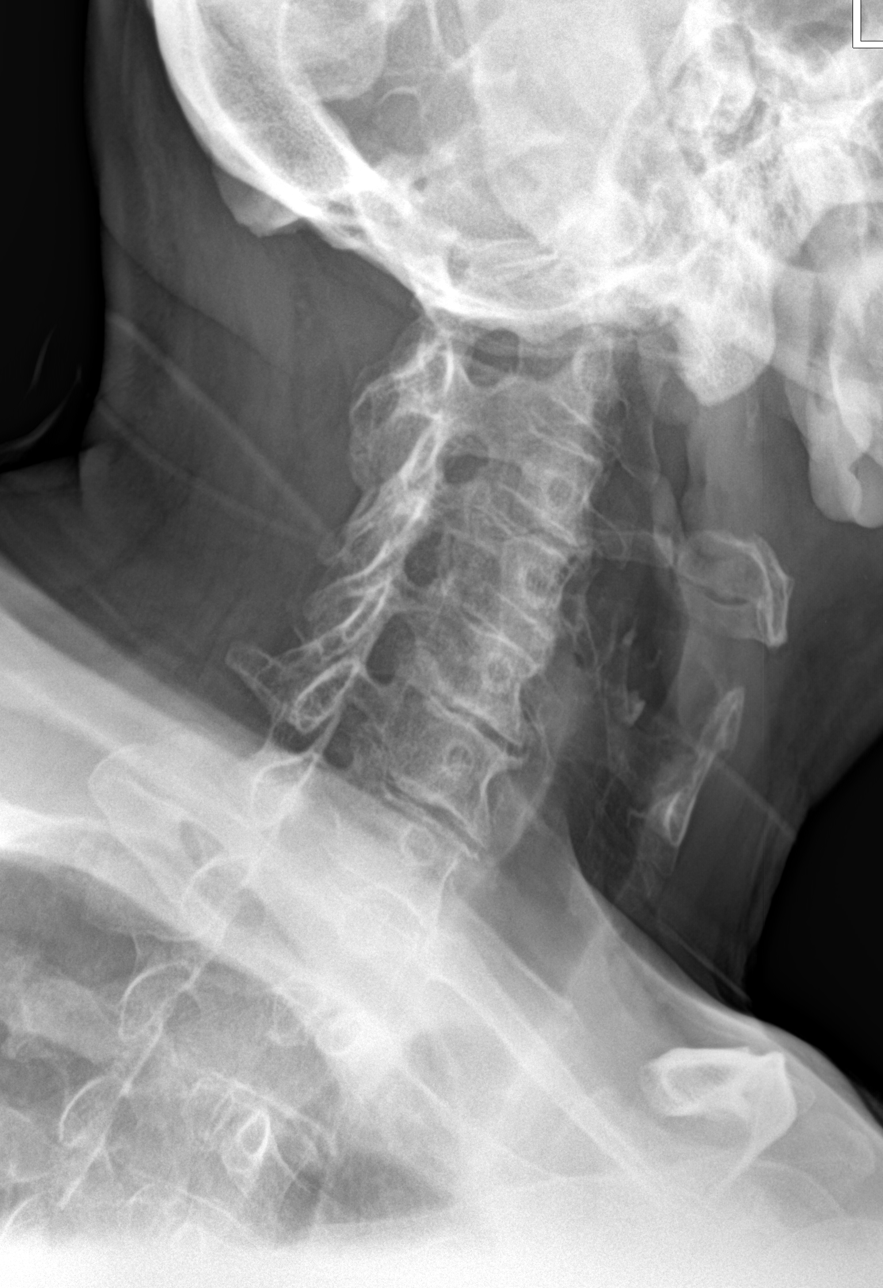

[c-spine ap]
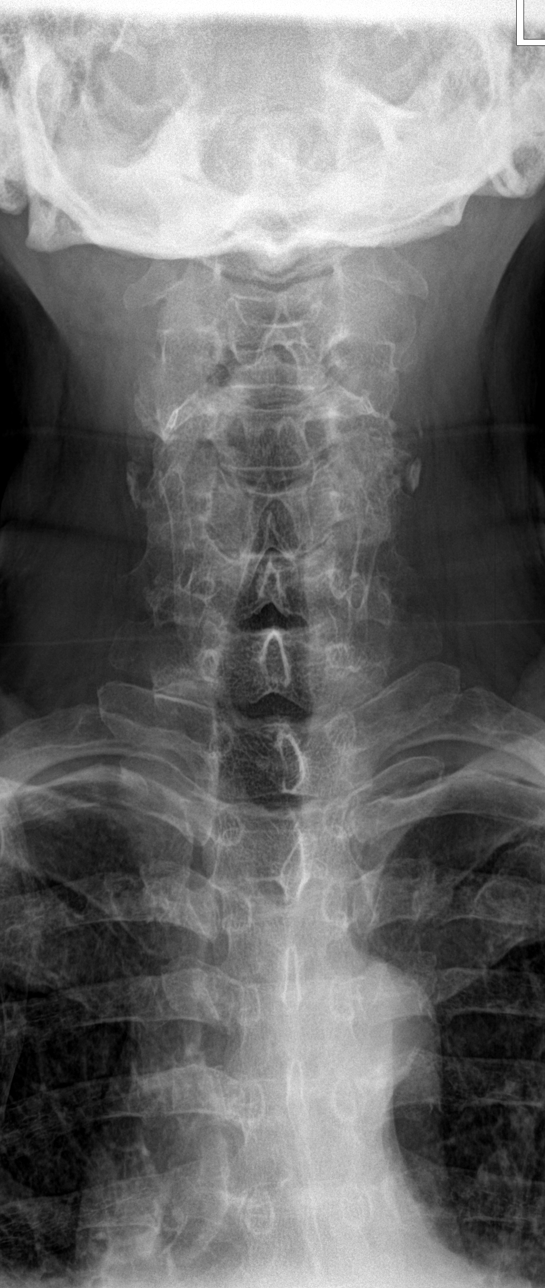

[c-spine open mouth]
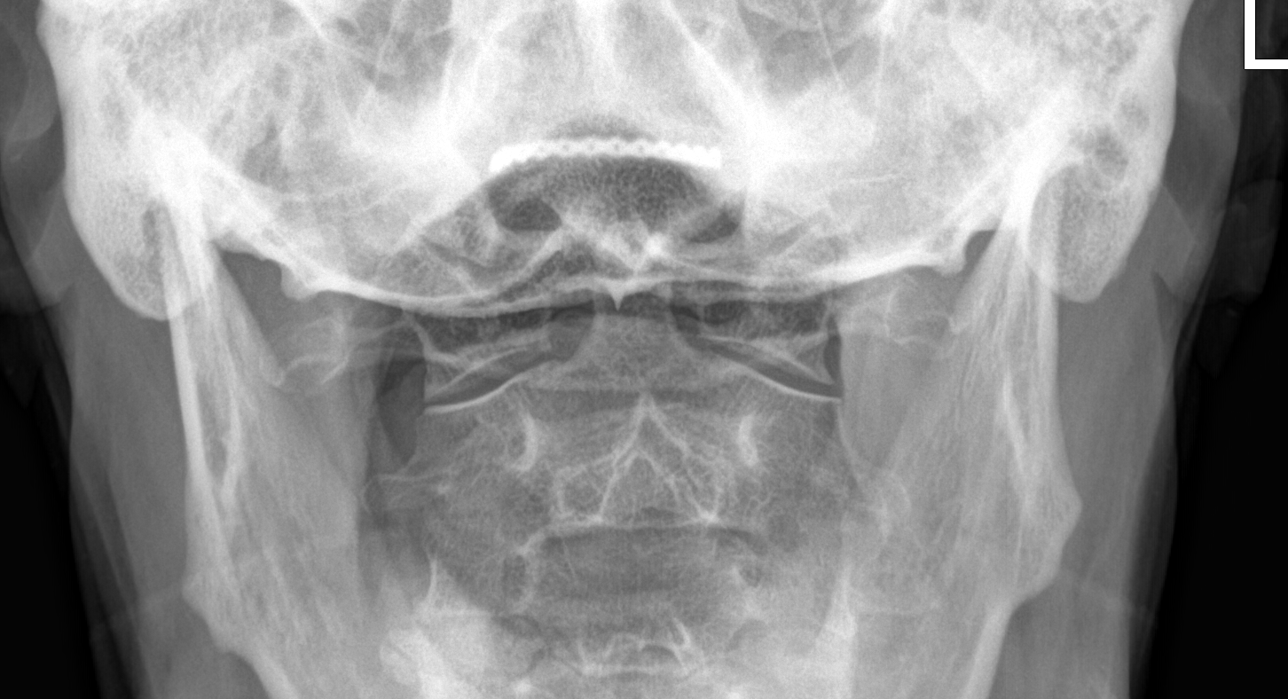

[5 of 5 positions shown; findings below may reference images not displayed]

FINDINGS: No fracture, bone lesion or spondylolisthesis.

Mild loss of disc height at C3-C4 with moderate loss of disc height
at C5-C6 and mild to moderate loss of disc height at C6-C7. There
are small uncovertebral spurs bilaterally without significant neural
foraminal narrowing.

Bilateral carotid artery calcifications are noted. Soft tissues are
otherwise unremarkable.
IMPRESSION: 1. No fracture or spondylolisthesis.  No acute finding.
2. Degenerative changes as detailed most prominent at C5-C6.

## 2022-03-22 ENCOUNTER — Other Ambulatory Visit: Payer: Self-pay | Admitting: Family Medicine

## 2022-03-22 DIAGNOSIS — G8929 Other chronic pain: Secondary | ICD-10-CM

## 2022-03-22 DIAGNOSIS — M4712 Other spondylosis with myelopathy, cervical region: Secondary | ICD-10-CM

## 2022-03-22 DIAGNOSIS — F320 Major depressive disorder, single episode, mild: Secondary | ICD-10-CM

## 2022-03-22 DIAGNOSIS — E782 Mixed hyperlipidemia: Secondary | ICD-10-CM

## 2022-03-22 DIAGNOSIS — Z0289 Encounter for other administrative examinations: Secondary | ICD-10-CM

## 2022-03-24 ENCOUNTER — Encounter: Payer: Self-pay | Admitting: Family Medicine

## 2022-03-24 ENCOUNTER — Ambulatory Visit (INDEPENDENT_AMBULATORY_CARE_PROVIDER_SITE_OTHER): Payer: Medicare Other | Admitting: Family Medicine

## 2022-03-24 VITALS — BP 133/72 | HR 65 | Ht 72.0 in | Wt 219.0 lb

## 2022-03-24 DIAGNOSIS — G8929 Other chronic pain: Secondary | ICD-10-CM | POA: Diagnosis not present

## 2022-03-24 DIAGNOSIS — I10 Essential (primary) hypertension: Secondary | ICD-10-CM | POA: Diagnosis not present

## 2022-03-24 DIAGNOSIS — M1711 Unilateral primary osteoarthritis, right knee: Secondary | ICD-10-CM

## 2022-03-24 DIAGNOSIS — M4712 Other spondylosis with myelopathy, cervical region: Secondary | ICD-10-CM

## 2022-03-24 DIAGNOSIS — E079 Disorder of thyroid, unspecified: Secondary | ICD-10-CM

## 2022-03-24 DIAGNOSIS — J449 Chronic obstructive pulmonary disease, unspecified: Secondary | ICD-10-CM | POA: Diagnosis not present

## 2022-03-24 DIAGNOSIS — E782 Mixed hyperlipidemia: Secondary | ICD-10-CM | POA: Diagnosis not present

## 2022-03-24 DIAGNOSIS — Z0289 Encounter for other administrative examinations: Secondary | ICD-10-CM

## 2022-03-24 MED ORDER — OXYCODONE-ACETAMINOPHEN 7.5-325 MG PO TABS: 1.0000 | ORAL_TABLET | Freq: Four times a day (QID) | ORAL | 0 refills | Status: DC | PRN

## 2022-03-24 MED ORDER — METHYLPREDNISOLONE ACETATE 80 MG/ML IJ SUSP
80.0000 mg | Freq: Once | INTRAMUSCULAR | Status: AC
  Administered 2022-03-24: 80 mg via INTRAMUSCULAR

## 2022-03-24 NOTE — Progress Notes (Signed)
BP 133/72   Pulse 65   Ht 6' (1.829 m)   Wt 219 lb (99.3 kg)   SpO2 98%   BMI 29.70 kg/m    Subjective:   Patient ID: [name], male    DOB: August 06, 1945, 77 y.o.   MRN: [id number]  HPI: [name] is a 77 y.o. male presenting on 03/24/2022 for Medical Management of Chronic Issues, Hypertension, Hyperlipidemia, Hypothyroidism, and Osteoarthritis (Cervical spine- refills requested of pain med)   HPI Hypertension Patient is currently on lisinopril and amlodipine, and their blood pressure today is 133/72. Patient denies any lightheadedness or dizziness. Patient denies headaches, blurred vision, chest pains, shortness of breath, or weakness. Denies any side effects from medication and is content with current medication.   Hypothyroidism recheck Patient is coming in for thyroid recheck today as well. They deny any issues with hair changes or heat or cold problems or diarrhea or constipation. They deny any chest pain or palpitations. They are currently on levothyroxine 200 micrograms   Hyperlipidemia Patient is coming in for recheck of his hyperlipidemia. The patient is currently taking simvastatin. They deny any issues with myalgias or history of liver damage from it. They deny any focal numbness or weakness or chest pain.   COPD Patient is coming in for COPD recheck today.  He is currently on albuterol.  He has a mild chronic cough but denies any major coughing spells or wheezing spells.  He has 1 nighttime symptoms per week and 2 daytime symptoms per week currently.   Pain assessment: Cause of pain-osteoarthritis of cervical spine with myelopathy Pain location-neck and shoulders and arms Pain on scale of 1-10- 6 Frequency-daily What increases pain-weather changes and with certain movements and exercise What makes pain Better-medication and rest Effects on ADL -minimal Any change in general medical condition-none  Current opioids rx-Percocet 7.5-325 every 6 hours as  needed # meds rx-120/month Effectiveness of current meds-works well Adverse reactions from pain meds-none Morphine equivalent-45  Pill count performed-No Last drug screen -01/04/2022 ( high risk q78m, moderate risk q85m, low risk yearly ) Urine drug screen today- No Was the [place] reviewed-yes  If yes were their any concerning findings? none     03/27/2019    8:56 AM  Opioid Risk   Alcohol 0  Illegal Drugs 0  Rx Drugs 0  Alcohol 0  Illegal Drugs 0  Rx Drugs 0  Age between 16-45 years  0  History of Preadolescent Sexual Abuse 0  Psychological Disease 0  Depression 1  Opioid Risk Tool Scoring 1  Opioid Risk Interpretation Low Risk   Pain contract signed on: 01/04/2022  Relevant past medical, surgical, family and social history reviewed and updated as indicated. Interim medical history since our last visit reviewed. Allergies and medications reviewed and updated.  Review of Systems  Constitutional:  Negative for chills and fever.  Eyes:  Negative for discharge.  Respiratory:  Negative for shortness of breath and wheezing.   Cardiovascular:  Negative for chest pain and leg swelling.  Musculoskeletal:  Positive for arthralgias (Patient has been having pain in his right knee over the past couple weeks, he did run low on his pain meds since as well does not know if that is been the trigger of it as the weather changes but it has been stiff and hurting his right knee.), back pain and neck pain. Negative for gait problem.  Skin:  Negative for rash.  All other systems reviewed and are negative.  Per HPI unless specifically indicated above   Allergies as of 03/24/2022   No Known Allergies      Medication List        Accurate as of March 24, 2022  9:06 AM. If you have any questions, ask your nurse or doctor.          albuterol 108 (90 Base) MCG/ACT inhaler Commonly known as: ProAir HFA Inhale 2 puffs into the lungs every 6 (six) hours as needed for wheezing or shortness  of breath.   amLODipine 10 MG tablet Commonly known as: NORVASC Take 1 tablet (10 mg total) by mouth daily.   aspirin EC 81 MG tablet Take 1 tablet (81 mg total) by mouth daily.   levothyroxine 200 MCG tablet Commonly known as: SYNTHROID TAKE ONE TABLET ONCE DAILY   lisinopril 20 MG tablet Commonly known as: ZESTRIL Take 1 tablet (20 mg total) by mouth daily.   multivitamin with minerals Tabs tablet Take 1 tablet by mouth daily.   naloxegol oxalate 12.5 MG Tabs tablet Commonly known as: Movantik Take 1 tablet (12.5 mg total) by mouth daily.   nitroGLYCERIN 0.4 MG SL tablet Commonly known as: NITROSTAT Place 1 tablet (0.4 mg total) under the tongue every 5 (five) minutes as needed for chest pain.   oxyCODONE-acetaminophen 7.5-325 MG tablet Commonly known as: PERCOCET Take 1 tablet by mouth every 6 (six) hours as needed for severe pain. What changed: Another medication with the same name was changed. Make sure you understand how and when to take each. Changed by: Worthy Rancher, MD   oxyCODONE-acetaminophen 7.5-325 MG tablet Commonly known as: PERCOCET Take 1 tablet by mouth every 6 (six) hours as needed for severe pain. Start taking on: April 23, 2022 What changed: These instructions start on April 23, 2022. If you are unsure what to do until then, ask your doctor or other care provider. Changed by: Worthy Rancher, MD   oxyCODONE-acetaminophen 7.5-325 MG tablet Commonly known as: PERCOCET Take 1 tablet by mouth every 6 (six) hours as needed for severe pain. Start taking on: May 23, 2022 What changed: These instructions start on May 23, 2022. If you are unsure what to do until then, ask your doctor or other care provider. Changed by: Fransisca Kaufmann Siddhanth Denk, MD   sertraline 50 MG tablet Commonly known as: ZOLOFT TAKE 1 TABLET DAILY   sildenafil 20 MG tablet Commonly known as: REVATIO TAKE UP TO 3 TABLETS AS DIRECTED WHEN NEEDED   simvastatin 40 MG  tablet Commonly known as: ZOCOR TAKE 1 TABLET AT BEDTIM   VITAMIN B-12 PO Take 2,500 mcg by mouth daily.         Objective:   BP 133/72   Pulse 65   Ht 6' (1.829 m)   Wt 219 lb (99.3 kg)   SpO2 98%   BMI 29.70 kg/m   Wt Readings from Last 3 Encounters:  03/24/22 219 lb (99.3 kg)  12/17/21 223 lb (101.2 kg)  09/17/21 220 lb (99.8 kg)    Physical Exam Vitals and nursing note reviewed.  Constitutional:      General: He is not in acute distress.    Appearance: He is well-developed. He is not diaphoretic.  Eyes:     General: No scleral icterus.    Conjunctiva/sclera: Conjunctivae normal.  Neck:     Thyroid: No thyromegaly.  Cardiovascular:     Rate and Rhythm: Normal rate and regular rhythm.     Heart sounds: Normal heart  sounds. No murmur heard. Pulmonary:     Effort: Pulmonary effort is normal. No respiratory distress.     Breath sounds: Normal breath sounds. No wheezing.  Musculoskeletal:        General: Normal range of motion.     Cervical back: Neck supple.     Right knee: Crepitus present. Tenderness present over the medial joint line and lateral joint line. Normal meniscus and normal patellar mobility.     Left knee: No tenderness. Normal meniscus and normal patellar mobility.  Lymphadenopathy:     Cervical: No cervical adenopathy.  Skin:    General: Skin is warm and dry.     Findings: No rash.  Neurological:     Mental Status: He is alert and oriented to person, place, and time.     Coordination: Coordination normal.  Psychiatric:        Behavior: Behavior normal.    Knee injection: Consent form signed. Risk factors of bleeding and infection discussed with patient and patient is agreeable towards injection. Patient prepped with Betadine. Lateral approach towards injection used. Injected 80 mg of Depo-Medrol and 1 mL of 2% lidocaine. Patient tolerated procedure well and no side effects from noted. Minimal to no bleeding. Simple bandage applied after.     Assessment & Plan:   Problem List Items Addressed This Visit       Cardiovascular and Mediastinum   Essential hypertension - Primary     Respiratory   COPD (chronic obstructive pulmonary disease) (HCC) (Chronic)   Relevant Medications   methylPREDNISolone acetate (DEPO-MEDROL) injection 80 mg     Endocrine   Thyroid disease     Nervous and Auditory   Osteoarthritis of cervical spine with myelopathy   Relevant Medications   methylPREDNISolone acetate (DEPO-MEDROL) injection 80 mg   oxyCODONE-acetaminophen (PERCOCET) 7.5-325 MG tablet (Start on 04/23/2022)   oxyCODONE-acetaminophen (PERCOCET) 7.5-325 MG tablet (Start on 05/23/2022)   oxyCODONE-acetaminophen (PERCOCET) 7.5-325 MG tablet     Other   Hyperlipidemia   Pain management contract signed   Relevant Medications   oxyCODONE-acetaminophen (PERCOCET) 7.5-325 MG tablet (Start on 04/23/2022)   oxyCODONE-acetaminophen (PERCOCET) 7.5-325 MG tablet (Start on 05/23/2022)   oxyCODONE-acetaminophen (PERCOCET) 7.5-325 MG tablet   Other Visit Diagnoses     Encounter for chronic pain management       Relevant Medications   oxyCODONE-acetaminophen (PERCOCET) 7.5-325 MG tablet (Start on 04/23/2022)   oxyCODONE-acetaminophen (PERCOCET) 7.5-325 MG tablet (Start on 05/23/2022)   oxyCODONE-acetaminophen (PERCOCET) 7.5-325 MG tablet   Primary osteoarthritis of right knee       Relevant Medications   methylPREDNISolone acetate (DEPO-MEDROL) injection 80 mg   oxyCODONE-acetaminophen (PERCOCET) 7.5-325 MG tablet (Start on 04/23/2022)   oxyCODONE-acetaminophen (PERCOCET) 7.5-325 MG tablet (Start on 05/23/2022)   oxyCODONE-acetaminophen (PERCOCET) 7.5-325 MG tablet        Follow up plan: Return in about 3 months (around 06/24/2022), or if symptoms worsen or fail to improve, for Hypertension and hyperlipidemia and pain.  Counseling provided for all of the vaccine components No orders of the defined types were placed in this  encounter.   Caryl Pina, MD [facility] 03/24/2022, 9:06 AM

## 2022-03-29 ENCOUNTER — Other Ambulatory Visit: Payer: Self-pay | Admitting: Family Medicine

## 2022-03-29 DIAGNOSIS — I10 Essential (primary) hypertension: Secondary | ICD-10-CM

## 2022-04-20 ENCOUNTER — Telehealth: Payer: Self-pay | Admitting: Family Medicine

## 2022-04-20 NOTE — Telephone Encounter (Signed)
Called patient to schedule Medicare Annual Wellness Visit (AWV). No voicemail available to leave a message.  Last date of AWV: 04/14/2021   Please schedule an appointment at any time with either [name] or [name], NHA's. .  If any questions, please contact me at [phone]   Thank you,  Judeth Cornfield,  AMB Clinical Support [facility] AWV Program Direct Dial ??[phone]  .

## 2022-04-26 ENCOUNTER — Telehealth: Payer: Self-pay | Admitting: Family Medicine

## 2022-04-26 NOTE — Telephone Encounter (Signed)
Contacted [name] to schedule their annual wellness visit. Appointment made for 05/05/2022.  Thank you,  Judeth Cornfield,  AMB Clinical Support [facility] AWV Program Direct Dial ??[phone]

## 2022-05-05 ENCOUNTER — Ambulatory Visit (INDEPENDENT_AMBULATORY_CARE_PROVIDER_SITE_OTHER): Payer: Medicare Other

## 2022-05-05 VITALS — Ht 71.0 in | Wt 220.0 lb

## 2022-05-05 DIAGNOSIS — Z0000 Encounter for general adult medical examination without abnormal findings: Secondary | ICD-10-CM

## 2022-05-05 NOTE — Progress Notes (Signed)
Subjective:   [name] is a 77 y.o. male who presents for Medicare Annual/Subsequent preventive examination. I connected with  [name] on 05/05/22 by a audio enabled telemedicine application and verified that I am speaking with the correct person using two identifiers.  Patient Location: Home  Provider Location: Home Office  I discussed the limitations of evaluation and management by telemedicine. The patient expressed understanding and agreed to proceed.  Review of Systems     Cardiac Risk Factors include: advanced age (>3men, >32 women);hypertension;male gender;dyslipidemia     Objective:    Today's Vitals   05/05/22 1522  Weight: 220 lb (99.8 kg)  Height: 5\' 11"  (1.803 m)   Body mass index is 30.68 kg/m.     05/05/2022    3:24 PM 04/14/2021    8:30 AM 07/17/2020    7:41 AM 06/19/2020    7:39 AM 06/17/2020    1:18 PM 04/13/2020    8:17 AM 04/11/2019    8:58 AM  Advanced Directives  Does Patient Have a Medical Advance Directive? No No No No No No No  Would patient like information on creating a medical advance directive? No - Patient declined No - Patient declined No - Patient declined No - Patient declined No - Patient declined Yes (MAU/Ambulatory/Procedural Areas - Information given) No - Patient declined    Current Medications (verified) Outpatient Encounter Medications as of 05/05/2022  Medication Sig   albuterol (PROAIR HFA) 108 (90 BASE) MCG/ACT inhaler Inhale 2 puffs into the lungs every 6 (six) hours as needed for wheezing or shortness of breath.   amLODipine (NORVASC) 10 MG tablet Take 1 tablet (10 mg total) by mouth daily.   aspirin EC 81 MG tablet Take 1 tablet (81 mg total) by mouth daily.   Cyanocobalamin (VITAMIN B-12 PO) Take 2,500 mcg by mouth daily.   levothyroxine (SYNTHROID) 200 MCG tablet TAKE ONE TABLET ONCE DAILY   lisinopril (ZESTRIL) 20 MG tablet TAKE 1 TABLET DAILY   Multiple Vitamin (MULTIVITAMIN WITH MINERALS) TABS tablet Take 1  tablet by mouth daily.   naloxegol oxalate (MOVANTIK) 12.5 MG TABS tablet Take 1 tablet (12.5 mg total) by mouth daily.   nitroGLYCERIN (NITROSTAT) 0.4 MG SL tablet Place 1 tablet (0.4 mg total) under the tongue every 5 (five) minutes as needed for chest pain.   oxyCODONE-acetaminophen (PERCOCET) 7.5-325 MG tablet Take 1 tablet by mouth every 6 (six) hours as needed for severe pain.   [START ON 05/23/2022] oxyCODONE-acetaminophen (PERCOCET) 7.5-325 MG tablet Take 1 tablet by mouth every 6 (six) hours as needed for severe pain.   oxyCODONE-acetaminophen (PERCOCET) 7.5-325 MG tablet Take 1 tablet by mouth every 6 (six) hours as needed for severe pain.   sertraline (ZOLOFT) 50 MG tablet TAKE 1 TABLET DAILY   sildenafil (REVATIO) 20 MG tablet TAKE UP TO 3 TABLETS AS DIRECTED WHEN NEEDED   simvastatin (ZOCOR) 40 MG tablet TAKE 1 TABLET AT BEDTIM   No facility-administered encounter medications on file as of 05/05/2022.    Allergies (verified) Patient has no known allergies.   History: Past Medical History:  Diagnosis Date   Anginal pain (HCC)    Arthritis    OSTEO BACK KNEES HIPS & HANDS   Arthropathy, unspecified, site unspecified    Chronic airway obstruction, not elsewhere classified    Coronary atherosclerosis of unspecified type of vessel, native or graft    a. LHC 5/16:  mLAD 20%, CFX stent ok, OM3 stent ok, mRCA 30%,  normal LVF   Headache(784.0)    Hx of echocardiogram    a. Echo 5/16:  mild LVH, EF 60%, mildly dilated Ao root (40 mm)   Hypothyroidism    Other and unspecified hyperlipidemia    Thyroid disease    hypothyroid   Unspecified essential hypertension    Unspecified sleep apnea    [facility] Modesto Charon at [facility] told pt he had sleep apnea, but never sent him for a sleep study so he doesn't wear a CPAP   Past Surgical History:  Procedure Laterality Date   ANKLE SURGERY     BALLOON DILATION N/A 05/02/2013   Procedure: BALLOON DILATION;  Surgeon: Malissa Hippo, MD;   [facility];  Service: Endoscopy;  Laterality: N/A;   BIOPSY  07/17/2020   Procedure: BIOPSY;  Surgeon: Dolores Frame, MD;  [facility];  Service: Gastroenterology;;   CARDIAC CATHETERIZATION  01/15/2013   CARDIAC CATHETERIZATION N/A 05/16/2014   Procedure: Left Heart Cath and Coronary Angiography;  Surgeon: Peter M Swaziland, MD;  Location: [facility] INVASIVE CV LAB;  Service: Cardiovascular;  Laterality: N/A;   COLONOSCOPY N/A 05/02/2013   Procedure: COLONOSCOPY;  Surgeon: Malissa Hippo, MD;  [facility];  Service: Endoscopy;  Laterality: N/A;  200-moved to 1200 Ann notified pt   COLONOSCOPY WITH PROPOFOL N/A 05/08/2020   Procedure: COLONOSCOPY WITH PROPOFOL;  Surgeon: Dolores Frame, MD;  [facility];  Service: Gastroenterology;  Laterality: N/A;  AM   COLONOSCOPY WITH PROPOFOL N/A 06/19/2020   Procedure: COLONOSCOPY WITH PROPOFOL;  Surgeon: Dolores Frame, MD;  [facility];  Service: Gastroenterology;  Laterality: N/A;  [id number]   COLONOSCOPY WITH PROPOFOL N/A 07/17/2020   Procedure: COLONOSCOPY WITH PROPOFOL;  Surgeon: Dolores Frame, MD;  [facility];  Service: Gastroenterology;  Laterality: N/A;  9:00   CORONARY STENT PLACEMENT     2003, 2004   ESOPHAGOGASTRODUODENOSCOPY N/A 05/02/2013   Procedure: ESOPHAGOGASTRODUODENOSCOPY (EGD);  Surgeon: Malissa Hippo, MD;  [facility];  Service: Endoscopy;  Laterality: N/A;   HEMORRHOID SURGERY  2022   HEMOSTASIS CLIP PLACEMENT  07/17/2020   Procedure: HEMOSTASIS CLIP PLACEMENT;  Surgeon: Dolores Frame, MD;  [facility];  Service: Gastroenterology;;   HIP ARTHROPLASTY     KNEE SURGERY Left 12/03/2013   LEFT HEART CATHETERIZATION WITH CORONARY ANGIOGRAM N/A 01/15/2013   Procedure: LEFT HEART CATHETERIZATION WITH CORONARY ANGIOGRAM;  Surgeon: Lesleigh Noe, MD;  Location: [facility] CATH LAB;  Service:  Cardiovascular;  Laterality: N/A;   MALONEY DILATION N/A 05/02/2013   Procedure: Elease Hashimoto DILATION;  Surgeon: Malissa Hippo, MD;  [facility];  Service: Endoscopy;  Laterality: N/A;   POLYPECTOMY  07/17/2020   Procedure: POLYPECTOMY;  Surgeon: Dolores Frame, MD;  [facility];  Service: Gastroenterology;;   Gaspar Bidding DILATION N/A 05/02/2013   Procedure: Gaspar Bidding DILATION;  Surgeon: Malissa Hippo, MD;  [facility];  Service: Endoscopy;  Laterality: N/A;   TONSILLECTOMY     Family History  Problem Relation Age of Onset   Stroke Mother    CAD Father        MI at age 98   Colon cancer Neg Hx    Social History   Socioeconomic History   Marital status: Widowed    Spouse name: Not on file   Number of children: 2   Years of education: Not on file  Highest education level: 11th grade  Occupational History   Occupation: Patent attorney: Barrister's clerk   Occupation: Retired   Tobacco Use   Smoking status: Former    Packs/day: 3.00    Years: 20.00    Additional pack years: 0.00    Total pack years: 60.00    Types: Cigarettes    Quit date: 05/26/1975    Years since quitting: 46.9   Smokeless tobacco: Current    Types: Chew  Vaping Use   Vaping Use: Never used  Substance and Sexual Activity   Alcohol use: No   Drug use: No   Sexual activity: Not Currently  Other Topics Concern   Not on file  Social History Narrative   Occupation Former:[employer]ologist, 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 - 2 children one boy,one girl.    Son is living with him right now - 2023   Social Determinants of Health   Financial Resource Strain: Low Risk  (05/05/2022)   Overall Financial Resource Strain (CARDIA)    Difficulty of Paying Living Expenses: Not hard at all  Food Insecurity: No Food Insecurity (05/05/2022)   Hunger Vital Sign    Worried About Running Out of Food in the Last Year: Never true    Ran Out of Food in the Last  Year: Never true  Transportation Needs: No Transportation Needs (05/05/2022)   PRAPARE - Administrator, Civil Service (Medical): No    Lack of Transportation (Non-Medical): No  Physical Activity: Insufficiently Active (05/05/2022)   Exercise Vital Sign    Days of Exercise per Week: 3 days    Minutes of Exercise per Session: 30 min  Stress: No Stress Concern Present (05/05/2022)   [employer] of Occupational Health - Occupational Stress Questionnaire    Feeling of Stress : Not at all  Social Connections: Moderately Isolated (05/05/2022)   Social Connection and Isolation Panel [NHANES]    Frequency of Communication with Friends and Family: More than three times a week    Frequency of Social Gatherings with Friends and Family: More than three times a week    Attends Religious Services: More than 4 times per year    Active Member of [employer] or Organizations: No    Attends Banker Meetings: Never    Marital Status: Widowed    Tobacco Counseling Ready to quit: Not Answered Counseling given: Not Answered   Clinical Intake:  Pre-visit preparation completed: Yes  Pain : No/denies pain     Nutritional Risks: None Diabetes: No  How often do you need to have someone help you when you read instructions, pamphlets, or other written materials from your doctor or pharmacy?: 1 - Never  Diabetic?no   Interpreter Needed?: No  Information entered by :: Renie Ora, LPN   Activities of Daily Living    05/05/2022    3:24 PM  In your present state of health, do you have any difficulty performing the following activities:  Hearing? 0  Vision? 0  Difficulty concentrating or making decisions? 0  Walking or climbing stairs? 0  Dressing or bathing? 0  Doing errands, shopping? 0  Preparing Food and eating ? N  Using the Toilet? N  In the past six months, have you accidently leaked urine? N  Do you have problems with loss of bowel control? N  Managing your  Medications? N  Managing your Finances? N  Housekeeping or managing your Housekeeping? N  Patient Care Team: Dettinger, Elige Radon, MD as PCP - General (Family Medicine) Swaziland, Peter M, MD as PCP - Cardiology (Cardiology) Swaz[place], Peter M, MD as Consulting Physician (Cardiology) Michaelle Copas, MD as Referring Physician (Optometry) Dolores Frame, MD as Consulting Physician (Gastroenterology) Azalee Course, [place] (Cardiology)  Indicate any recent Medical Services you may have received from other than [facility] providers in the past year (date may be approximate).     Assessment:   This is a routine wellness examination for Naser.  Hearing/Vision screen Vision Screening - Comments:: Wears rx glasses - up to date with routine eye exams with  Dr.Johnson   Dietary issues and exercise activities discussed: Current Exercise Habits: Home exercise routine, Type of exercise: walking, Time (Minutes): 30, Frequency (Times/Week): 3, Weekly Exercise (Minutes/Week): 90, Intensity: Mild, Exercise limited by: None identified   Goals Addressed             This Visit's Progress    DIET - INCREASE WATER INTAKE   On track    Drink at least 6-8 glasses a day        Depression Screen    05/05/2022    3:24 PM 05/05/2022    3:23 PM 03/24/2022    8:32 AM 12/17/2021   11:21 AM 09/17/2021    8:23 AM 06/16/2021   12:56 PM 04/14/2021    8:23 AM  PHQ 2/9 Scores  PHQ - 2 Score 0 0 0 0 0 0 0  PHQ- 9 Score 0 0 1   1 1     Fall Risk    05/05/2022    3:23 PM 03/24/2022    8:31 AM 12/17/2021   11:21 AM 09/17/2021    8:23 AM 06/16/2021   12:56 PM  Fall Risk   Falls in the past year? 0 0 0 0 0  Number falls in past yr: 0      Injury with Fall? 0      Risk for fall due to : No Fall Risks      Follow up Falls prevention discussed        FALL RISK PREVENTION PERTAINING TO THE HOME:  Any stairs in or around the home? Yes  If so, are there any without handrails? No  Home free of loose throw rugs in  walkways, pet beds, electrical cords, etc? Yes  Adequate lighting in your home to reduce risk of falls? Yes   ASSISTIVE DEVICES UTILIZED TO PREVENT FALLS:  Life alert? No  Use of a cane, walker or w/c? No  Grab bars in the bathroom? No  Shower chair or bench in shower? No  Elevated toilet seat or a handicapped toilet? No       12/12/2017    9:25 AM 12/07/2016   12:15 PM  MMSE - Mini Mental State Exam  Orientation to time 5 5  Orientation to Place 5 5  Registration 3 3  Attention/ Calculation 3 2  Recall 3 2  Language- name 2 objects 2 2  Language- repeat 1 1  Language- follow 3 step command 3 3  Language- read & follow direction 1 1  Write a sentence 1 1  Copy design 1 1  Total score 28 26        05/05/2022    3:25 PM 04/14/2021    8:24 AM 04/11/2019    9:02 AM 04/11/2019    8:37 AM  6CIT Screen  What Year? 0 points 0 points 0 points 0 points  What month? 0 points 3 points 3 points 3 points  What time? 0 points 0 points 0 points 0 points  Count back from 20 0 points 0 points 0 points   Months in reverse 0 points 4 points 2 points   Repeat phrase 0 points 6 points 0 points   Total Score 0 points 13 points 5 points     Immunizations Immunization History  Administered Date(s) Administered   Fluad Quad(high Dose 65+) 09/20/2018   Influenza Inj Mdck Quad With Preservative 09/29/2017   Influenza Split 10/06/2012   Influenza, High Dose Seasonal PF 09/14/2013   Influenza,inj,Quad PF,6+ Mos 10/01/2014, 09/30/2016   Influenza-Unspecified 10/07/2015, 10/02/2017   Pneumococcal Conjugate-13 12/24/2012   Pneumococcal Polysaccharide-23 10/02/2017   Pneumococcal-Unspecified 10/02/2017   Tdap 04/04/2013    TDAP status: Up to date  Flu Vaccine status: Up to date  Pneumococcal vaccine status: Up to date  Covid-19 vaccine status: Completed vaccines  Qualifies for Shingles Vaccine? Yes   Zostavax completed Yes   Shingrix Completed?: No.    Education has been provided  regarding the importance of this vaccine. Patient has been advised to call insurance company to determine out of pocket expense if they have not yet received this vaccine. Advised may also receive vaccine at local pharmacy or Health Dept. Verbalized acceptance and understanding.  Screening Tests Health Maintenance  Topic Date Due   Zoster Vaccines- Shingrix (1 of 2) 03/24/2023 (Originally 11/16/1964)   INFLUENZA VACCINE  08/04/2022   DTaP/Tdap/Td (2 - Td or Tdap) 04/05/2023   Medicare Annual Wellness (AWV)  05/05/2023   COLONOSCOPY (Pts 45-1yrs Insurance coverage will need to be confirmed)  07/18/2023   Pneumonia Vaccine 15+ Years old  Completed   Hepatitis C Screening  Completed   HPV VACCINES  Aged Out   COVID-19 Vaccine  Discontinued    Health Maintenance  There are no preventive care reminders to display for this patient.   Colorectal cancer screening: No longer required.   Lung Cancer Screening: (Low Dose CT Chest recommended if Age 22-80 years, 30 pack-year currently smoking OR have quit w/in 15years.) does not qualify.   Lung Cancer Screening Referral: n/a  Additional Screening:  Hepatitis C Screening: does not qualify;   Vision Screening: Recommended annual ophthalmology exams for early detection of glaucoma and other disorders of the eye. Is the patient up to date with their annual eye exam?  Yes  Who is the provider or what is the name of the office in which the patient attends annual eye exams? Dr.johnson  If pt is not established with a provider, would they like to be referred to a provider to establish care? No .   Dental Screening: Recommended annual dental exams for proper oral hygiene  Community Resource Referral / Chronic Care Management: CRR required this visit?  No   CCM required this visit?  No      Plan:     I have personally reviewed and noted the following in the patient's chart:   Medical and social history Use of alcohol, tobacco or illicit  drugs  Current medications and supplements including opioid prescriptions. Patient is not currently taking opioid prescriptions. Functional ability and status Nutritional status Physical activity Advanced directives List of other physicians Hospitalizations, surgeries, and ER visits in previous 12 months Vitals Screenings to include cognitive, depression, and falls Referrals and appointments  In addition, I have reviewed and discussed with patient certain preventive protocols, quality metrics, and best practice recommendations. A written personalized  care plan for preventive services as well as general preventive health recommendations were provided to patient.     Lorrene Reid, LPN   01/08/1094   Nurse Notes: none

## 2022-05-05 NOTE — Patient Instructions (Signed)
[name] , Thank you for taking time to come for your Medicare Wellness Visit. I appreciate your ongoing commitment to your health goals. Please review the following plan we discussed and let me know if I can assist you in the future.   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 He would like to start slow and become more active again.        This is a list of the screening recommended for you and due dates:  Health Maintenance  Topic Date Due   Zoster (Shingles) Vaccine (1 of 2) 03/24/2023*   Flu Shot  08/04/2022   DTaP/Tdap/Td vaccine (2 - Td or Tdap) 04/05/2023   Medicare Annual Wellness Visit  05/05/2023   Colon Cancer Screening  07/18/2023   Pneumonia Vaccine  Completed   Hepatitis C Screening: USPSTF Recommendation to screen - Ages 18-79 yo.  Completed   HPV Vaccine  Aged Out   COVID-19 Vaccine  Discontinued  *Topic was postponed. The date shown is not the original due date.    Advanced directives: Advance directive discussed with you today. I have provided a copy for you to complete at home and have notarized. Once this is complete please bring a copy in to our office so we can scan it into your chart.   Conditions/risks identified: Aim for 30 minutes of exercise or brisk walking, 6-8 glasses of water, and 5 servings of fruits and vegetables each day.   Next appointment: Follow up in one year for your annual wellness visit.   Preventive Care 41 Years and Older, Male  Preventive care refers to lifestyle choices and visits with your health care provider that can promote health and wellness. What does preventive care include? A yearly physical exam. This is also called an annual well check. Dental exams once or twice a year. Routine eye exams. Ask  your health care provider how often you should have your eyes checked. Personal lifestyle choices, including: Daily care of your teeth and gums. Regular physical activity. Eating a healthy diet. Avoiding tobacco and drug use. Limiting alcohol use. Practicing safe sex. Taking low doses of aspirin every day. Taking vitamin and mineral supplements as recommended by your health care provider. What happens during an annual well check? The services and screenings done by your health care provider during your annual well check will depend on your age, overall health, lifestyle risk factors, and family history of disease. Counseling  Your health care provider may ask you questions about your: Alcohol use. Tobacco use. Drug use. Emotional well-being. Home and relationship well-being. Sexual activity. Eating habits. History of falls. Memory and ability to understand (cognition). Work and work Astronomer. Screening  You may have the following tests or measurements: Height, weight, and BMI. Blood pressure. Lipid and cholesterol levels. These may be checked every 5 years, or more frequently if you are over 75 years old. Skin check. Lung cancer screening. You may have this screening every year starting at age 92 if you have a 30-pack-year history of smoking and currently smoke or have quit within the past 15 years. Fecal occult blood test (FOBT) of the stool. You may  have this test every year starting at age 56. Flexible sigmoidoscopy or colonoscopy. You may have a sigmoidoscopy every 5 years or a colonoscopy every 10 years starting at age 90. Prostate cancer screening. Recommendations will vary depending on your family history and other risks. Hepatitis C blood test. Hepatitis B blood test. Sexually transmitted disease (STD) testing. Diabetes screening. This is done by checking your blood sugar (glucose) after you have not eaten for a while (fasting). You may have this done every 1-3  years. Abdominal aortic aneurysm (AAA) screening. You may need this if you are a current or former smoker. Osteoporosis. You may be screened starting at age 69 if you are at high risk. Talk with your health care provider about your test results, treatment options, and if necessary, the need for more tests. Vaccines  Your health care provider may recommend certain vaccines, such as: Influenza vaccine. This is recommended every year. Tetanus, diphtheria, and acellular pertussis (Tdap, Td) vaccine. You may need a Td booster every 10 years. Zoster vaccine. You may need this after age 8. Pneumococcal 13-valent conjugate (PCV13) vaccine. One dose is recommended after age 15. Pneumococcal polysaccharide (PPSV23) vaccine. One dose is recommended after age 23. Talk to your health care provider about which screenings and vaccines you need and how often you need them. This information is not intended to replace advice given to you by your health care provider. Make sure you discuss any questions you have with your health care provider. Document Released: 01/16/2015 Document Revised: 09/09/2015 Document Reviewed: 10/21/2014 Elsevier Interactive Patient Education  2017 [place] Prevention in the Home Falls can cause injuries. They can happen to people of all ages. There are many things you can do to make your home safe and to help prevent falls. What can I do on the outside of my home? Regularly fix the edges of walkways and driveways and fix any cracks. Remove anything that might make you trip as you walk through a door, such as a raised step or threshold. Trim any bushes or trees on the path to your home. Use bright outdoor lighting. Clear any walking paths of anything that might make someone trip, such as rocks or tools. Regularly check to see if handrails are loose or broken. Make sure that both sides of any steps have handrails. Any raised decks and porches should have guardrails on the  edges. Have any leaves, snow, or ice cleared regularly. Use sand or salt on walking paths during winter. Clean up any spills in your garage right away. This includes oil or grease spills. What can I do in the bathroom? Use night lights. Install grab bars by the toilet and in the tub and shower. Do not use towel bars as grab bars. Use non-skid mats or decals in the tub or shower. If you need to sit down in the shower, use a plastic, non-slip stool. Keep the floor dry. Clean up any water that spills on the floor as soon as it happens. Remove soap buildup in the tub or shower regularly. Attach bath mats securely with double-sided non-slip rug tape. Do not have throw rugs and other things on the floor that can make you trip. What can I do in the bedroom? Use night lights. Make sure that you have a light by your bed that is easy to reach. Do not use any sheets or blankets that are too big for your bed. They should not hang down onto the floor. Have a firm  chair that has side arms. You can use this for support while you get dressed. Do not have throw rugs and other things on the floor that can make you trip. What can I do in the kitchen? Clean up any spills right away. Avoid walking on wet floors. Keep items that you use a lot in easy-to-reach places. If you need to reach something above you, use a strong step stool that has a grab bar. Keep electrical cords out of the way. Do not use floor polish or wax that makes floors slippery. If you must use wax, use non-skid floor wax. Do not have throw rugs and other things on the floor that can make you trip. What can I do with my stairs? Do not leave any items on the stairs. Make sure that there are handrails on both sides of the stairs and use them. Fix handrails that are broken or loose. Make sure that handrails are as long as the stairways. Check any carpeting to make sure that it is firmly attached to the stairs. Fix any carpet that is loose or  worn. Avoid having throw rugs at the top or bottom of the stairs. If you do have throw rugs, attach them to the floor with carpet tape. Make sure that you have a light switch at the top of the stairs and the bottom of the stairs. If you do not have them, ask someone to add them for you. What else can I do to help prevent falls? Wear shoes that: Do not have high heels. Have rubber bottoms. Are comfortable and fit you well. Are closed at the toe. Do not wear sandals. If you use a stepladder: Make sure that it is fully opened. Do not climb a closed stepladder. Make sure that both sides of the stepladder are locked into place. Ask someone to hold it for you, if possible. Clearly mark and make sure that you can see: Any grab bars or handrails. First and last steps. Where the edge of each step is. Use tools that help you move around (mobility aids) if they are needed. These include: Canes. Walkers. Scooters. Crutches. Turn on the lights when you go into a dark area. Replace any light bulbs as soon as they burn out. Set up your furniture so you have a clear path. Avoid moving your furniture around. If any of your floors are uneven, fix them. If there are any pets around you, be aware of where they are. Review your medicines with your doctor. Some medicines can make you feel dizzy. This can increase your chance of falling. Ask your doctor what other things that you can do to help prevent falls. This information is not intended to replace advice given to you by your health care provider. Make sure you discuss any questions you have with your health care provider. Document Released: 10/16/2008 Document Revised: 05/28/2015 Document Reviewed: 01/24/2014 Elsevier Interactive Patient Education  2017 [employer].

## 2022-05-12 ENCOUNTER — Other Ambulatory Visit: Payer: Self-pay | Admitting: Family Medicine

## 2022-05-12 DIAGNOSIS — I10 Essential (primary) hypertension: Secondary | ICD-10-CM

## 2022-06-15 ENCOUNTER — Encounter: Payer: Self-pay | Admitting: Family Medicine

## 2022-06-15 ENCOUNTER — Ambulatory Visit (INDEPENDENT_AMBULATORY_CARE_PROVIDER_SITE_OTHER): Payer: Medicare Other | Admitting: Family Medicine

## 2022-06-15 VITALS — BP 147/81 | HR 61 | Ht 71.0 in | Wt 218.0 lb

## 2022-06-15 DIAGNOSIS — M4712 Other spondylosis with myelopathy, cervical region: Secondary | ICD-10-CM

## 2022-06-15 DIAGNOSIS — E782 Mixed hyperlipidemia: Secondary | ICD-10-CM | POA: Diagnosis not present

## 2022-06-15 DIAGNOSIS — I10 Essential (primary) hypertension: Secondary | ICD-10-CM | POA: Diagnosis not present

## 2022-06-15 DIAGNOSIS — G8929 Other chronic pain: Secondary | ICD-10-CM

## 2022-06-15 DIAGNOSIS — Z79899 Other long term (current) drug therapy: Secondary | ICD-10-CM

## 2022-06-15 DIAGNOSIS — Z0289 Encounter for other administrative examinations: Secondary | ICD-10-CM

## 2022-06-15 LAB — LIPID PANEL
Chol/HDL Ratio: 2.1 ratio (ref 0.0–5.0)
Cholesterol, Total: 141 mg/dL (ref 100–199)
HDL: 67 mg/dL (ref >39)
LDL Chol Calc (NIH): 59 mg/dL (ref 0–99)
Triglycerides: 76 mg/dL (ref 0–149)
VLDL Cholesterol Cal: 15 mg/dL (ref 5–40)

## 2022-06-15 LAB — CMP14+EGFR
ALT: 15 IU/L (ref 0–44)
AST: 22 IU/L (ref 0–40)
Albumin/Globulin Ratio: 1.8
Albumin: 4.4 g/dL (ref 3.8–4.8)
Alkaline Phosphatase: 60 IU/L (ref 44–121)
BUN/Creatinine Ratio: 15 (ref 10–24)
BUN: 16 mg/dL (ref 8–27)
Bilirubin Total: 0.4 mg/dL (ref 0.0–1.2)
CO2: 19 mmol/L — ABNORMAL LOW (ref 20–29)
Calcium: 9 mg/dL (ref 8.6–10.2)
Chloride: 104 mmol/L (ref 96–106)
Creatinine, Ser: 1.07 mg/dL (ref 0.76–1.27)
Globulin, Total: 2.4 g/dL (ref 1.5–4.5)
Glucose: 99 mg/dL (ref 70–99)
Potassium: 4.4 mmol/L (ref 3.5–5.2)
Sodium: 138 mmol/L (ref 134–144)
Total Protein: 6.8 g/dL (ref 6.0–8.5)
eGFR: 71 mL/min/{1.73_m2} (ref >59)

## 2022-06-15 LAB — CBC WITH DIFFERENTIAL/PLATELET
Basophils Absolute: 0 10*3/uL (ref 0.0–0.2)
Basos: 0 %
EOS (ABSOLUTE): 0.2 10*3/uL (ref 0.0–0.4)
Eos: 2 %
Hematocrit: 42.4 % (ref 37.5–51.0)
Hemoglobin: 13.9 g/dL (ref 13.0–17.7)
Immature Grans (Abs): 0 10*3/uL (ref 0.0–0.1)
Immature Granulocytes: 0 %
Lymphocytes Absolute: 1.7 10*3/uL (ref 0.7–3.1)
Lymphs: 24 %
MCH: 29.8 pg (ref 26.6–33.0)
MCHC: 32.8 g/dL (ref 31.5–35.7)
MCV: 91 fL (ref 79–97)
Monocytes Absolute: 0.7 10*3/uL (ref 0.1–0.9)
Monocytes: 10 %
Neutrophils Absolute: 4.5 10*3/uL (ref 1.4–7.0)
Neutrophils: 64 %
Platelets: 272 10*3/uL (ref 150–450)
RBC: 4.66 x10E6/uL (ref 4.14–5.80)
RDW: 15.2 % (ref 11.6–15.4)
WBC: 7.1 10*3/uL (ref 3.4–10.8)

## 2022-06-15 MED ORDER — LISINOPRIL 20 MG PO TABS: 20.0000 mg | ORAL_TABLET | Freq: Every day | ORAL | 3 refills | Status: DC

## 2022-06-15 MED ORDER — OXYCODONE-ACETAMINOPHEN 7.5-325 MG PO TABS
1.0000 | ORAL_TABLET | Freq: Four times a day (QID) | ORAL | 0 refills | Status: DC | PRN
Start: 2022-06-15 — End: 2022-09-16

## 2022-06-15 MED ORDER — OXYCODONE-ACETAMINOPHEN 7.5-325 MG PO TABS: 1.0000 | ORAL_TABLET | Freq: Four times a day (QID) | ORAL | 0 refills | Status: DC | PRN

## 2022-06-15 MED ORDER — AMLODIPINE BESYLATE 10 MG PO TABS: 10.0000 mg | ORAL_TABLET | Freq: Every day | ORAL | 3 refills | Status: DC

## 2022-06-15 NOTE — Progress Notes (Signed)
BP (!) 147/81   Pulse 61   Ht 5\' 11"  (1.803 m)   Wt 218 lb (98.9 kg)   SpO2 95%   BMI 30.40 kg/m    Subjective:   Patient ID: [name], male    DOB: [birth year], 77 y.o.   MRN: [id number]  HPI: [name] is a 77 y.o. male presenting on 06/15/2022 for Medical Management of Chronic Issues, Osteoarthritis, and Hypertension   HPI Hypertension Patient is currently on lisinopril and amlodipine, and their blood pressure today is 147/81. Patient denies any lightheadedness or dizziness. Patient denies headaches, blurred vision, chest pains, shortness of breath, or weakness. Denies any side effects from medication and is content with current medication.   Hypothyroidism recheck Patient is coming in for thyroid recheck today as well. They deny any issues with hair changes or heat or cold problems or diarrhea or constipation. They deny any chest pain or palpitations. They are currently on levothyroxine 200 micrograms   Hyperlipidemia Patient is coming in for recheck of his hyperlipidemia. The patient is currently taking simvastatin. They deny any issues with myalgias or history of liver damage from it. They deny any focal numbness or weakness or chest pain.   Pain assessment: Cause of pain-degenerative disc disease and arthritis, mostly in the neck Pain location-cervical spine and knees Pain on scale of 1-10- 5 Frequency-daily What increases pain-weather changes What makes pain Better-medication Effects on ADL -minimal Any change in general medical condition-none  Current opioids rx-Percocet 7.5-325 4 times daily as needed # meds rx-120/month Effectiveness of current meds-works well Adverse reactions from pain meds-none Morphine equivalent-45  Pill count performed-No Last drug screen -06/05/2021 ( high risk q107m, moderate risk q51m, low risk yearly ) Urine drug screen today- Yes Was the [facility] reviewed-yes  If yes were their any concerning findings? -None      03/27/2019    8:56 AM  Opioid Risk   Alcohol 0  Illegal Drugs 0  Rx Drugs 0  Alcohol 0  Illegal Drugs 0  Rx Drugs 0  Age between 16-45 years  0  History of Preadolescent Sexual Abuse 0  Psychological Disease 0  Depression 1  Opioid Risk Tool Scoring 1  Opioid Risk Interpretation Low Risk   Pain contract signed on: 01/04/2022  Relevant past medical, surgical, family and social history reviewed and updated as indicated. Interim medical history since our last visit reviewed. Allergies and medications reviewed and updated.  Review of Systems  Constitutional:  Negative for chills and fever.  Eyes:  Negative for visual disturbance.  Respiratory:  Negative for shortness of breath and wheezing.   Cardiovascular:  Negative for chest pain and leg swelling.  Musculoskeletal:  Positive for arthralgias, back pain, myalgias and neck pain. Negative for gait problem.  Skin:  Negative for rash.  Neurological:  Negative for dizziness, weakness and numbness.  All other systems reviewed and are negative.   Per HPI unless specifically indicated above   Allergies as of 06/15/2022   No Known Allergies      Medication List        Accurate as of June 15, 2022  9:15 AM. If you have any questions, ask your nurse or doctor.          albuterol 108 (90 Base) MCG/ACT inhaler Commonly known as: ProAir HFA Inhale 2 puffs into the lungs every 6 (six) hours as needed for wheezing or shortness of breath.   amLODipine 10 MG tablet Commonly known as: NORVASC  Take 1 tablet (10 mg total) by mouth daily.   aspirin EC 81 MG tablet Take 1 tablet (81 mg total) by mouth daily.   levothyroxine 200 MCG tablet Commonly known as: SYNTHROID TAKE ONE TABLET ONCE DAILY   lisinopril 20 MG tablet Commonly known as: ZESTRIL Take 1 tablet (20 mg total) by mouth daily.   multivitamin with minerals Tabs tablet Take 1 tablet by mouth daily.   naloxegol oxalate 12.5 MG Tabs tablet Commonly known as:  Movantik Take 1 tablet (12.5 mg total) by mouth daily.   nitroGLYCERIN 0.4 MG SL tablet Commonly known as: NITROSTAT Place 1 tablet (0.4 mg total) under the tongue every 5 (five) minutes as needed for chest pain.   oxyCODONE-acetaminophen 7.5-325 MG tablet Commonly known as: PERCOCET Take 1 tablet by mouth every 6 (six) hours as needed for severe pain. What changed: Another medication with the same name was changed. Make sure you understand how and when to take each. Changed by: Nils Pyle, MD   oxyCODONE-acetaminophen 7.5-325 MG tablet Commonly known as: PERCOCET Take 1 tablet by mouth every 6 (six) hours as needed for severe pain. Start taking on: July 14, 2022 What changed: These instructions start on July 14, 2022. If you are unsure what to do until then, ask your doctor or other care provider. Changed by: Nils Pyle, MD   oxyCODONE-acetaminophen 7.5-325 MG tablet Commonly known as: PERCOCET Take 1 tablet by mouth every 6 (six) hours as needed for severe pain. Start taking on: August 14, 2022 What changed: These instructions start on August 14, 2022. If you are unsure what to do until then, ask your doctor or other care provider. Changed by: Elige Radon Nickalas Mccarrick, MD   sertraline 50 MG tablet Commonly known as: ZOLOFT TAKE 1 TABLET DAILY   sildenafil 20 MG tablet Commonly known as: REVATIO TAKE UP TO 3 TABLETS AS DIRECTED WHEN NEEDED   simvastatin 40 MG tablet Commonly known as: ZOCOR TAKE 1 TABLET AT BEDTIM   VITAMIN B-12 PO Take 2,500 mcg by mouth daily.         Objective:   BP (!) 147/81   Pulse 61   Ht 5\' 11"  (1.803 m)   Wt 218 lb (98.9 kg)   SpO2 95%   BMI 30.40 kg/m   Wt Readings from Last 3 Encounters:  06/15/22 218 lb (98.9 kg)  05/05/22 220 lb (99.8 kg)  03/24/22 219 lb (99.3 kg)    Physical Exam Vitals and nursing note reviewed.  Constitutional:      General: He is not in acute distress.    Appearance: He is well-developed.  He is not diaphoretic.  Eyes:     General: No scleral icterus.    Conjunctiva/sclera: Conjunctivae normal.  Neck:     Thyroid: No thyromegaly.  Cardiovascular:     Rate and Rhythm: Normal rate and regular rhythm.     Heart sounds: Normal heart sounds. No murmur heard. Pulmonary:     Effort: Pulmonary effort is normal. No respiratory distress.     Breath sounds: Normal breath sounds. No wheezing.  Musculoskeletal:        General: No swelling.     Cervical back: Neck supple.  Lymphadenopathy:     Cervical: No cervical adenopathy.  Skin:    General: Skin is warm and dry.     Findings: No rash.  Neurological:     Mental Status: He is alert and oriented to person, place, and time.  Coordination: Coordination normal.  Psychiatric:        Behavior: Behavior normal.       Assessment & Plan:   Problem List Items Addressed This Visit       Cardiovascular and Mediastinum   Essential hypertension - Primary   Relevant Medications   amLODipine (NORVASC) 10 MG tablet   lisinopril (ZESTRIL) 20 MG tablet   Other Relevant Orders   CBC with Differential/Platelet   CMP14+EGFR   Lipid panel     Nervous and Auditory   Osteoarthritis of cervical spine with myelopathy   Relevant Medications   oxyCODONE-acetaminophen (PERCOCET) 7.5-325 MG tablet (Start on 07/14/2022)   oxyCODONE-acetaminophen (PERCOCET) 7.5-325 MG tablet (Start on 08/14/2022)   oxyCODONE-acetaminophen (PERCOCET) 7.5-325 MG tablet   Other Relevant Orders   ToxASSURE Select 13 (MW), Urine     Other   Hyperlipidemia   Relevant Medications   amLODipine (NORVASC) 10 MG tablet   lisinopril (ZESTRIL) 20 MG tablet   Other Relevant Orders   CBC with Differential/Platelet   CMP14+EGFR   Lipid panel   Pain management contract signed   Relevant Medications   oxyCODONE-acetaminophen (PERCOCET) 7.5-325 MG tablet (Start on 07/14/2022)   oxyCODONE-acetaminophen (PERCOCET) 7.5-325 MG tablet (Start on 08/14/2022)    oxyCODONE-acetaminophen (PERCOCET) 7.5-325 MG tablet   Other Relevant Orders   ToxASSURE Select 13 (MW), Urine   Other Visit Diagnoses     Controlled substance agreement signed       Relevant Orders   ToxASSURE Select 13 (MW), Urine   Encounter for chronic pain management       Relevant Medications   oxyCODONE-acetaminophen (PERCOCET) 7.5-325 MG tablet (Start on 07/14/2022)   oxyCODONE-acetaminophen (PERCOCET) 7.5-325 MG tablet (Start on 08/14/2022)   oxyCODONE-acetaminophen (PERCOCET) 7.5-325 MG tablet   Other Relevant Orders   ToxASSURE Select 13 (MW), Urine       Blood pressure rechecked mildly elevated at 147/81.  He will keep a close eye on it at home and check it every day for the next few weeks and let me know how it has been running. Follow up plan: Return in about 3 months (around 09/15/2022), or if symptoms worsen or fail to improve, for Pain management recheck.  Counseling provided for all of the vaccine components Orders Placed This Encounter  Procedures   CBC with Differential/Platelet   CMP14+EGFR   Lipid panel   ToxASSURE Select 13 (MW), Urine    Arville Care, MD [facility] 06/15/2022, 9:15 AM

## 2022-06-18 LAB — TOXASSURE SELECT 13 (MW), URINE

## 2022-09-16 ENCOUNTER — Encounter: Payer: Self-pay | Admitting: Family Medicine

## 2022-09-16 ENCOUNTER — Ambulatory Visit (INDEPENDENT_AMBULATORY_CARE_PROVIDER_SITE_OTHER): Payer: Medicare Other | Admitting: Family Medicine

## 2022-09-16 VITALS — BP 132/75 | HR 67 | Ht 71.0 in | Wt 211.0 lb

## 2022-09-16 DIAGNOSIS — E782 Mixed hyperlipidemia: Secondary | ICD-10-CM | POA: Diagnosis not present

## 2022-09-16 DIAGNOSIS — E079 Disorder of thyroid, unspecified: Secondary | ICD-10-CM | POA: Diagnosis not present

## 2022-09-16 DIAGNOSIS — I10 Essential (primary) hypertension: Secondary | ICD-10-CM | POA: Diagnosis not present

## 2022-09-16 DIAGNOSIS — M4712 Other spondylosis with myelopathy, cervical region: Secondary | ICD-10-CM | POA: Diagnosis not present

## 2022-09-16 DIAGNOSIS — G8929 Other chronic pain: Secondary | ICD-10-CM

## 2022-09-16 DIAGNOSIS — Z0289 Encounter for other administrative examinations: Secondary | ICD-10-CM

## 2022-09-16 MED ORDER — OXYCODONE-ACETAMINOPHEN 7.5-325 MG PO TABS
1.0000 | ORAL_TABLET | Freq: Four times a day (QID) | ORAL | 0 refills | Status: DC | PRN
Start: 2022-09-16 — End: 2022-12-16

## 2022-09-16 MED ORDER — OXYCODONE-ACETAMINOPHEN 7.5-325 MG PO TABS
1.0000 | ORAL_TABLET | Freq: Four times a day (QID) | ORAL | 0 refills | Status: DC | PRN
Start: 2022-10-15 — End: 2022-12-16

## 2022-09-16 MED ORDER — OXYCODONE-ACETAMINOPHEN 7.5-325 MG PO TABS
1.0000 | ORAL_TABLET | Freq: Four times a day (QID) | ORAL | 0 refills | Status: DC | PRN
Start: 2022-11-14 — End: 2022-12-16

## 2022-09-16 NOTE — Progress Notes (Signed)
BP 132/75   Pulse 67   Ht 5\' 11"  (1.803 m)   Wt 211 lb (95.7 kg)   SpO2 94%   BMI 29.43 kg/m    Subjective:   Patient ID: [name], male    DOB: [birth year], 77 y.o.   MRN: [id number]  HPI: [name] is a 77 y.o. male presenting on 09/16/2022 for Medical Management of Chronic Issues, Hypertension, Hyperlipidemia, and Hypothyroidism   HPI Hypothyroidism recheck Patient is coming in for thyroid recheck today as well. They deny any issues with hair changes or heat or cold problems or diarrhea or constipation. They deny any chest pain or palpitations. They are currently on levothyroxine 200 micrograms   Hypertension Patient is currently on lisinopril and amlodipine, and their blood pressure today is 132/75. Patient has occasional lightheadedness dizziness where he sees spots but does not happen as often. Patient denies headaches, blurred vision, chest pains, shortness of breath, or weakness. Denies any side effects from medication and is content with current medication.   Hyperlipidemia Patient is coming in for recheck of his hyperlipidemia. The patient is currently taking simvastatin. They deny any issues with myalgias or history of liver damage from it. They deny any focal numbness or weakness or chest pain.   Pain assessment: Cause of pain-degenerative arthritis of the cervical spine Pain location-neck and shoulders Pain on scale of 1-10- 5 Frequency-daily What increases pain-certain movements or weather changes What makes pain Better-medication Effects on ADL -minimal Any change in general medical condition-none  Current opioids rx-oxycodone 7.5-3 25 4  times daily as needed # meds rx-120/month Effectiveness of current meds-works well for the most part Adverse reactions from pain meds-none except constipation which she is taking Movantik for Morphine equivalent-45  Pill count performed-No Last drug screen -06/29/2022 ( high risk q63m, moderate risk q50m, low  risk yearly ) Urine drug screen today- No Was the [facility] reviewed-yes  If yes were their any concerning findings? -None Pain contract signed on: 06/29/2022  Relevant past medical, surgical, family and social history reviewed and updated as indicated. Interim medical history since our last visit reviewed. Allergies and medications reviewed and updated.  Review of Systems  Constitutional:  Negative for chills and fever.  Eyes:  Negative for visual disturbance.  Respiratory:  Negative for shortness of breath and wheezing.   Cardiovascular:  Negative for chest pain and leg swelling.  Musculoskeletal:  Positive for arthralgias and neck pain. Negative for back pain and gait problem.  Skin:  Negative for rash.  Neurological:  Positive for dizziness and light-headedness.  All other systems reviewed and are negative.   Per HPI unless specifically indicated above   Allergies as of 09/16/2022   No Known Allergies      Medication List        Accurate as of September 16, 2022  1:05 PM. If you have any questions, ask your nurse or doctor.          albuterol 108 (90 Base) MCG/ACT inhaler Commonly known as: ProAir HFA Inhale 2 puffs into the lungs every 6 (six) hours as needed for wheezing or shortness of breath.   amLODipine 10 MG tablet Commonly known as: NORVASC Take 1 tablet (10 mg total) by mouth daily.   aspirin EC 81 MG tablet Take 1 tablet (81 mg total) by mouth daily.   levothyroxine 200 MCG tablet Commonly known as: SYNTHROID TAKE ONE TABLET ONCE DAILY   lisinopril 20 MG tablet Commonly known as: ZESTRIL Take  1 tablet (20 mg total) by mouth daily.   multivitamin with minerals Tabs tablet Take 1 tablet by mouth daily.   naloxegol oxalate 12.5 MG Tabs tablet Commonly known as: Movantik Take 1 tablet (12.5 mg total) by mouth daily.   nitroGLYCERIN 0.4 MG SL tablet Commonly known as: NITROSTAT Place 1 tablet (0.4 mg total) under the tongue every 5 (five) minutes  as needed for chest pain.   oxyCODONE-acetaminophen 7.5-325 MG tablet Commonly known as: PERCOCET Take 1 tablet by mouth every 6 (six) hours as needed for severe pain. What changed: Another medication with the same name was changed. Make sure you understand how and when to take each. Changed by: Elige Radon Amoreena Neubert   oxyCODONE-acetaminophen 7.5-325 MG tablet Commonly known as: PERCOCET Take 1 tablet by mouth every 6 (six) hours as needed for severe pain. Start taking on: October 15, 2022 What changed: These instructions start on October 15, 2022. If you are unsure what to do until then, ask your doctor or other care provider. Changed by: Elige Radon Camiya Vinal   oxyCODONE-acetaminophen 7.5-325 MG tablet Commonly known as: PERCOCET Take 1 tablet by mouth every 6 (six) hours as needed for severe pain. Start taking on: November 14, 2022 What changed: These instructions start on November 14, 2022. If you are unsure what to do until then, ask your doctor or other care provider. Changed by: Elige Radon Layne Lebon   sertraline 50 MG tablet Commonly known as: ZOLOFT TAKE 1 TABLET DAILY   sildenafil 20 MG tablet Commonly known as: REVATIO TAKE UP TO 3 TABLETS AS DIRECTED WHEN NEEDED   simvastatin 40 MG tablet Commonly known as: ZOCOR TAKE 1 TABLET AT BEDTIM   VITAMIN B-12 PO Take 2,500 mcg by mouth daily.         Objective:   BP 132/75   Pulse 67   Ht 5\' 11"  (1.803 m)   Wt 211 lb (95.7 kg)   SpO2 94%   BMI 29.43 kg/m   Wt Readings from Last 3 Encounters:  09/16/22 211 lb (95.7 kg)  06/15/22 218 lb (98.9 kg)  05/05/22 220 lb (99.8 kg)    Physical Exam Vitals and nursing note reviewed.  Constitutional:      General: He is not in acute distress.    Appearance: He is well-developed. He is not diaphoretic.  Eyes:     General: No scleral icterus.    Conjunctiva/sclera: Conjunctivae normal.  Neck:     Thyroid: No thyromegaly.  Cardiovascular:     Rate and Rhythm: Normal rate  and regular rhythm.     Heart sounds: Normal heart sounds. No murmur heard. Pulmonary:     Effort: Pulmonary effort is normal. No respiratory distress.     Breath sounds: Normal breath sounds. No wheezing.  Musculoskeletal:        General: No swelling.     Cervical back: Neck supple.  Lymphadenopathy:     Cervical: No cervical adenopathy.  Skin:    General: Skin is warm and dry.     Findings: No rash.  Neurological:     Mental Status: He is alert and oriented to person, place, and time.     Coordination: Coordination normal.  Psychiatric:        Behavior: Behavior normal.       Assessment & Plan:   Problem List Items Addressed This Visit       Cardiovascular and Mediastinum   Essential hypertension - Primary     Endocrine  Thyroid disease   Relevant Orders   TSH     Nervous and Auditory   Osteoarthritis of cervical spine with myelopathy   Relevant Medications   oxyCODONE-acetaminophen (PERCOCET) 7.5-325 MG tablet (Start on 10/15/2022)   oxyCODONE-acetaminophen (PERCOCET) 7.5-325 MG tablet (Start on 11/14/2022)   oxyCODONE-acetaminophen (PERCOCET) 7.5-325 MG tablet     Other   Hyperlipidemia   Pain management contract signed   Relevant Medications   oxyCODONE-acetaminophen (PERCOCET) 7.5-325 MG tablet (Start on 10/15/2022)   oxyCODONE-acetaminophen (PERCOCET) 7.5-325 MG tablet (Start on 11/14/2022)   oxyCODONE-acetaminophen (PERCOCET) 7.5-325 MG tablet   Other Visit Diagnoses     Encounter for chronic pain management       Relevant Medications   oxyCODONE-acetaminophen (PERCOCET) 7.5-325 MG tablet (Start on 10/15/2022)   oxyCODONE-acetaminophen (PERCOCET) 7.5-325 MG tablet (Start on 11/14/2022)   oxyCODONE-acetaminophen (PERCOCET) 7.5-325 MG tablet     Continue current medicine, seems to be doing well, blood pressure is well today.  As far as the dizziness recommended that he check his blood pressure at home if he can when he feels that way and let us know  what it is running.  Follow up plan: Return in about 3 months (around 12/16/2022), or if symptoms worsen or fail to improve, for  thyroid and hypertension.  Counseling provided for all of the vaccine components Orders Placed This Encounter  Procedures   TSH    Arville Care, MD [facility] 09/16/2022, 1:05 PM

## 2022-10-13 ENCOUNTER — Ambulatory Visit (INDEPENDENT_AMBULATORY_CARE_PROVIDER_SITE_OTHER): Payer: Medicare Other | Admitting: Family Medicine

## 2022-10-13 ENCOUNTER — Encounter: Payer: Self-pay | Admitting: Family Medicine

## 2022-10-13 VITALS — BP 128/64 | HR 59 | Ht 71.0 in | Wt 213.0 lb

## 2022-10-13 DIAGNOSIS — M1711 Unilateral primary osteoarthritis, right knee: Secondary | ICD-10-CM | POA: Diagnosis not present

## 2022-10-13 MED ORDER — METHYLPREDNISOLONE ACETATE 80 MG/ML IJ SUSP
80.0000 mg | Freq: Once | INTRAMUSCULAR | Status: AC
Start: 2022-10-13 — End: 2022-10-13
  Administered 2022-10-13: 80 mg via INTRA_ARTICULAR

## 2022-10-13 NOTE — Progress Notes (Signed)
BP 128/64   Pulse (!) 59   Ht 5\' 11"  (1.803 m)   Wt 213 lb (96.6 kg)   SpO2 96%   BMI 29.71 kg/m    Subjective:   Patient ID: [name], male    DOB: 1945/07/21, 77 y.o.   MRN: [id number]  HPI: [name] is a 77 y.o. male presenting on 10/13/2022 for Knee Pain (right)   HPI Right knee pain Patient has been having right knee pain and osteoarthritis.  He has been dealing with this for some time and had an injection a couple years ago and he said that injection did well for him for a while.  He denies any major issues but just with gets sore and swollen and then causes his whole leg to ache because of the tightness in it.  Relevant past medical, surgical, family and social history reviewed and updated as indicated. Interim medical history since our last visit reviewed. Allergies and medications reviewed and updated.  Review of Systems  Constitutional:  Negative for chills and fever.  Respiratory:  Negative for shortness of breath and wheezing.   Cardiovascular:  Negative for chest pain and leg swelling.  Musculoskeletal:  Positive for arthralgias and joint swelling. Negative for back pain and gait problem.  Skin:  Negative for color change and rash.  All other systems reviewed and are negative.   Per HPI unless specifically indicated above   Allergies as of 10/13/2022   No Known Allergies      Medication List        Accurate as of October 13, 2022 10:38 AM. If you have any questions, ask your nurse or doctor.          albuterol 108 (90 Base) MCG/ACT inhaler Commonly known as: ProAir HFA Inhale 2 puffs into the lungs every 6 (six) hours as needed for wheezing or shortness of breath.   amLODipine 10 MG tablet Commonly known as: NORVASC Take 1 tablet (10 mg total) by mouth daily.   aspirin EC 81 MG tablet Take 1 tablet (81 mg total) by mouth daily.   levothyroxine 200 MCG tablet Commonly known as: SYNTHROID TAKE ONE TABLET ONCE DAILY    lisinopril 20 MG tablet Commonly known as: ZESTRIL Take 1 tablet (20 mg total) by mouth daily.   multivitamin with minerals Tabs tablet Take 1 tablet by mouth daily.   naloxegol oxalate 12.5 MG Tabs tablet Commonly known as: Movantik Take 1 tablet (12.5 mg total) by mouth daily.   nitroGLYCERIN 0.4 MG SL tablet Commonly known as: NITROSTAT Place 1 tablet (0.4 mg total) under the tongue every 5 (five) minutes as needed for chest pain.   oxyCODONE-acetaminophen 7.5-325 MG tablet Commonly known as: PERCOCET Take 1 tablet by mouth every 6 (six) hours as needed for severe pain.   oxyCODONE-acetaminophen 7.5-325 MG tablet Commonly known as: PERCOCET Take 1 tablet by mouth every 6 (six) hours as needed for severe pain. Start taking on: October 15, 2022   oxyCODONE-acetaminophen 7.5-325 MG tablet Commonly known as: PERCOCET Take 1 tablet by mouth every 6 (six) hours as needed for severe pain. Start taking on: November 14, 2022   sertraline 50 MG tablet Commonly known as: ZOLOFT TAKE 1 TABLET DAILY   sildenafil 20 MG tablet Commonly known as: REVATIO TAKE UP TO 3 TABLETS AS DIRECTED WHEN NEEDED   simvastatin 40 MG tablet Commonly known as: ZOCOR TAKE 1 TABLET AT BEDTIM   VITAMIN B-12 PO Take 2,500 mcg by  mouth daily.         Objective:   BP 128/64   Pulse (!) 59   Ht 5\' 11"  (1.803 m)   Wt 213 lb (96.6 kg)   SpO2 96%   BMI 29.71 kg/m   Wt Readings from Last 3 Encounters:  10/13/22 213 lb (96.6 kg)  09/16/22 211 lb (95.7 kg)  06/15/22 218 lb (98.9 kg)    Physical Exam Vitals and nursing note reviewed.  Constitutional:      General: He is not in acute distress.    Appearance: He is well-developed. He is not diaphoretic.  Eyes:     General: No scleral icterus.    Conjunctiva/sclera: Conjunctivae normal.  Skin:    General: Skin is warm and dry.     Findings: No rash.  Neurological:     Mental Status: He is alert and oriented to person, place, and time.      Coordination: Coordination normal.  Psychiatric:        Behavior: Behavior normal.     Knee injection: Consent form signed. Risk factors of bleeding and infection discussed with patient and patient is agreeable towards injection. Patient prepped with Betadine. Lateral approach towards injection used. Injected 80 mg of Depo-Medrol and 1 mL of 2% lidocaine. Patient tolerated procedure well and no side effects from noted. Minimal to no bleeding. Simple bandage applied after.   Assessment & Plan:   Problem List Items Addressed This Visit   None Visit Diagnoses     Primary osteoarthritis of right knee    -  Primary   Relevant Medications   methylPREDNISolone acetate (DEPO-MEDROL) injection 80 mg (Start on 10/13/2022 10:45 AM)        Follow up plan: Return if symptoms worsen or fail to improve.  Counseling provided for all of the vaccine components No orders of the defined types were placed in this encounter.   Arville Care, MD [facility] 10/13/2022, 10:38 AM

## 2022-10-18 ENCOUNTER — Telehealth: Payer: Self-pay | Admitting: Family Medicine

## 2022-10-18 NOTE — Telephone Encounter (Signed)
Left message making pt aware that Dr. Louanne Skye is out of the office today and will return tomorrow.  Advised pt to contact other pharmacies to see if they have his current strength of oxycodone and if they do [facility] can send in tomorrow. Or will wait on [facility]'s response in changing the strength to the one [facility] has in stock.

## 2022-10-20 ENCOUNTER — Ambulatory Visit: Payer: Medicare Other | Admitting: Family Medicine

## 2022-10-20 ENCOUNTER — Encounter: Payer: Self-pay | Admitting: Family Medicine

## 2022-10-20 VITALS — BP 118/70 | HR 67 | Ht 71.0 in | Wt 210.0 lb

## 2022-10-20 DIAGNOSIS — M1711 Unilateral primary osteoarthritis, right knee: Secondary | ICD-10-CM | POA: Diagnosis not present

## 2022-10-20 MED ORDER — KETOROLAC TROMETHAMINE 60 MG/2ML IM SOLN
60.0000 mg | Freq: Once | INTRAMUSCULAR | Status: AC
Start: 2022-10-20 — End: 2022-10-20
  Administered 2022-10-20: 60 mg via INTRAMUSCULAR

## 2022-10-20 NOTE — Progress Notes (Signed)
BP 118/70   Pulse 67   Ht 5\' 11"  (1.803 m)   Wt 210 lb (95.3 kg)   SpO2 95%   BMI 29.29 kg/m    Subjective:   Patient ID: [name], male    DOB: 05/14/1945, 77 y.o.   MRN: [id number]  HPI: [name] is a 77 y.o. male presenting on 10/20/2022 for Knee Pain (Right. Recent injection here.)   HPI Right knee pain Patient comes in complaining with persistent right knee pain.  He had an injection 1 week ago did not help at all.  He says the knee injection that he had in his left knee before did help a lot but this 1 did not help at all.  He has not noticed any difference and he still having a hard time walking because of the pain in that right knee.  He denies any trauma or anything that brought it upon but it is gradually Worsening until it has been causing significant pain.  He is on Percocets as well and takes those and he says they help a little but not helping significantly.  Relevant past medical, surgical, family and social history reviewed and updated as indicated. Interim medical history since our last visit reviewed. Allergies and medications reviewed and updated.  Review of Systems  Constitutional:  Negative for chills and fever.  Respiratory:  Negative for shortness of breath and wheezing.   Cardiovascular:  Negative for chest pain and leg swelling.  Musculoskeletal:  Positive for arthralgias, gait problem and joint swelling.  Skin:  Negative for rash.  All other systems reviewed and are negative.   Per HPI unless specifically indicated above   Allergies as of 10/20/2022   No Known Allergies      Medication List        Accurate as of October 20, 2022  2:14 PM. If you have any questions, ask your nurse or doctor.          albuterol 108 (90 Base) MCG/ACT inhaler Commonly known as: ProAir HFA Inhale 2 puffs into the lungs every 6 (six) hours as needed for wheezing or shortness of breath.   amLODipine 10 MG tablet Commonly known as:  NORVASC Take 1 tablet (10 mg total) by mouth daily.   aspirin EC 81 MG tablet Take 1 tablet (81 mg total) by mouth daily.   levothyroxine 200 MCG tablet Commonly known as: SYNTHROID TAKE ONE TABLET ONCE DAILY   lisinopril 20 MG tablet Commonly known as: ZESTRIL Take 1 tablet (20 mg total) by mouth daily.   multivitamin with minerals Tabs tablet Take 1 tablet by mouth daily.   naloxegol oxalate 12.5 MG Tabs tablet Commonly known as: Movantik Take 1 tablet (12.5 mg total) by mouth daily.   nitroGLYCERIN 0.4 MG SL tablet Commonly known as: NITROSTAT Place 1 tablet (0.4 mg total) under the tongue every 5 (five) minutes as needed for chest pain.   oxyCODONE-acetaminophen 7.5-325 MG tablet Commonly known as: PERCOCET Take 1 tablet by mouth every 6 (six) hours as needed for severe pain.   oxyCODONE-acetaminophen 7.5-325 MG tablet Commonly known as: PERCOCET Take 1 tablet by mouth every 6 (six) hours as needed for severe pain.   oxyCODONE-acetaminophen 7.5-325 MG tablet Commonly known as: PERCOCET Take 1 tablet by mouth every 6 (six) hours as needed for severe pain. Start taking on: November 14, 2022   sertraline 50 MG tablet Commonly known as: ZOLOFT TAKE 1 TABLET DAILY   sildenafil 20 MG  tablet Commonly known as: REVATIO TAKE UP TO 3 TABLETS AS DIRECTED WHEN NEEDED   simvastatin 40 MG tablet Commonly known as: ZOCOR TAKE 1 TABLET AT BEDTIM   VITAMIN B-12 PO Take 2,500 mcg by mouth daily.         Objective:   BP 118/70   Pulse 67   Ht 5\' 11"  (1.803 m)   Wt 210 lb (95.3 kg)   SpO2 95%   BMI 29.29 kg/m   Wt Readings from Last 3 Encounters:  10/20/22 210 lb (95.3 kg)  10/13/22 213 lb (96.6 kg)  09/16/22 211 lb (95.7 kg)    Physical Exam Vitals and nursing note reviewed.  Constitutional:      General: He is not in acute distress.    Appearance: He is well-developed. He is not diaphoretic.  Eyes:     General: No scleral icterus.     Conjunctiva/sclera: Conjunctivae normal.  Neck:     Thyroid: No thyromegaly.  Musculoskeletal:        General: Normal range of motion.     Right knee: Effusion and crepitus present. No deformity or ecchymosis. Tenderness present over the medial joint line and lateral joint line. No LCL laxity, MCL laxity, ACL laxity or PCL laxity. Normal meniscus.  Skin:    General: Skin is warm and dry.     Findings: No rash.  Neurological:     Mental Status: He is alert and oriented to person, place, and time.     Coordination: Coordination normal.  Psychiatric:        Behavior: Behavior normal.       Assessment & Plan:   Problem List Items Addressed This Visit   None Visit Diagnoses     Primary osteoarthritis of right knee    -  Primary   Relevant Medications   ketorolac (TORADOL) injection 60 mg (Start on 10/20/2022  2:15 PM)   Other Relevant Orders   Ambulatory referral to Orthopedic Surgery       Will refer to orthopedic surgery, seems like the injections did not help at all. Will give IM Toradol 60 mg today. Follow up plan: Return if symptoms worsen or fail to improve.  Counseling provided for all of the vaccine components Orders Placed This Encounter  Procedures   Ambulatory referral to Orthopedic Surgery    Arville Care, MD [facility] 10/20/2022, 2:14 PM

## 2022-10-26 ENCOUNTER — Other Ambulatory Visit (INDEPENDENT_AMBULATORY_CARE_PROVIDER_SITE_OTHER): Payer: Medicare Other

## 2022-10-26 ENCOUNTER — Encounter: Payer: Self-pay | Admitting: Orthopaedic Surgery

## 2022-10-26 ENCOUNTER — Ambulatory Visit (INDEPENDENT_AMBULATORY_CARE_PROVIDER_SITE_OTHER): Payer: Medicare Other | Admitting: Orthopaedic Surgery

## 2022-10-26 VITALS — Ht 71.0 in | Wt 210.0 lb

## 2022-10-26 DIAGNOSIS — M25561 Pain in right knee: Secondary | ICD-10-CM | POA: Diagnosis not present

## 2022-10-26 NOTE — Addendum Note (Signed)
Addended by: Rogers Seeds on: 10/26/2022 01:56 PM   Modules accepted: Orders

## 2022-10-26 NOTE — Progress Notes (Signed)
Office Visit Note   Patient: [name]           Date of Birth: May 22, 1945           MRN: [id number] Visit Date: 10/26/2022              Requested by: Dettinger, Elige Radon, MD [address] [place],  [place] [address] PCP: Dettinger, Elige Radon, MD   Assessment & Plan: Visit Diagnoses:  1. Acute pain of right knee     Plan: Patient's had anti-inflammatories on chronic pain management, had knee injection without relief with persistent catching in his right knee.  Will proceed with MRI scan office follow-up after MRI for review.  Follow-Up Instructions: No follow-ups on file.   Orders:  Orders Placed This Encounter  Procedures   XR KNEE 3 VIEW RIGHT   No orders of the defined types were placed in this encounter.     Procedures: No procedures performed   Clinical Data: No additional findings.   Subjective: Chief Complaint  Patient presents with   Right Knee - Pain    HPI 77 year old male returns with right knee pain limping with pain present x 3 months.  He is taken anti-inflammatories without relief had an injection by Dr. Louanne Skye on 10/13/2022.  He continues to have pain and popping primarily posterior medial in the knee difficulty with stairs.  He had an IM Toradol injection that helped some with the pain.  Past history of opposite left knee arthroscopy for meniscal tear when he had similar symptoms with posterior medial pain.  Patient on chronic oxycodone 7.5/325 120 tablets monthly.  Past history of COPD depression hypertension sleep apnea with an C3-4 cervical disc bulge.  Review of Systems all systems noncontributory to HPI.   Objective: Vital Signs: Ht 5\' 11"  (1.803 m)   Wt 210 lb (95.3 kg)   BMI 29.29 kg/m   Physical Exam Constitutional:      Appearance: He is well-developed.  HENT:     Head: Normocephalic and atraumatic.     Right Ear: External ear normal.     Left Ear: External ear normal.  Eyes:     Pupils: Pupils are equal, round, and  reactive to light.  Neck:     Thyroid: No thyromegaly.     Trachea: No tracheal deviation.  Cardiovascular:     Rate and Rhythm: Normal rate.  Pulmonary:     Effort: Pulmonary effort is normal.     Breath sounds: No wheezing.  Abdominal:     General: Bowel sounds are normal.     Palpations: Abdomen is soft.  Musculoskeletal:     Cervical back: Neck supple.  Skin:    General: Skin is warm and dry.     Capillary Refill: Capillary refill takes less than 2 seconds.  Neurological:     Mental Status: He is alert and oriented to person, place, and time.  Psychiatric:        Behavior: Behavior normal.        Thought Content: Thought content normal.        Judgment: Judgment normal.     Ortho Exam patient has trace knee effusion is amatory with right knee limp.  Some pain with hyperextension flexion extension shows no popping no locking.  Posterior medial joint line tenderness.  No definite Baker's cyst.  Specialty Comments:  No specialty comments available.  Imaging: No results found.   PMFS History: Patient Active Problem List   Diagnosis  Date Noted   Depression, major, single episode, mild (HCC) 03/19/2021   Osteoarthritis of cervical spine with myelopathy 03/19/2021   Grade II hemorrhoids 02/10/2021   Pain management contract signed 03/08/2016   Hyperlipidemia 10/29/2013   Colon adenoma 04/08/2013   Thyroid disease    CAD (coronary artery disease)    Bilateral lower extremity edema 09/06/2011   GERD 09/18/2008   Essential hypertension 05/01/2007   Arthritis of knee 05/01/2007   COPD (chronic obstructive pulmonary disease) (HCC) 05/01/2007   Arthropathy 05/01/2007   SLEEP APNEA 05/01/2007   Past Medical History:  Diagnosis Date   Anginal pain (HCC)    Arthritis    OSTEO BACK KNEES HIPS & HANDS   Arthropathy, unspecified, site unspecified    Chronic airway obstruction, not elsewhere classified    Coronary atherosclerosis of unspecified type of vessel, native or  graft    a. LHC 5/16:  mLAD 20%, CFX stent ok, OM3 stent ok, mRCA 30%, normal LVF   Headache(784.0)    Hx of echocardiogram    a. Echo 5/16:  mild LVH, EF 60%, mildly dilated Ao root (40 mm)   Hypothyroidism    Other and unspecified hyperlipidemia    Thyroid disease    hypothyroid   Unspecified essential hypertension    Unspecified sleep apnea    Dr. Modesto Charon at [facility] told pt he had sleep apnea, but never sent him for a sleep study so he doesn't wear a CPAP    Family History  Problem Relation Age of Onset   Stroke Mother    CAD Father        MI at age 60   Colon cancer Neg Hx     Past Surgical History:  Procedure Laterality Date   ANKLE SURGERY     BALLOON DILATION N/A 05/02/2013   Procedure: BALLOON DILATION;  Surgeon: Malissa Hippo, MD;  [facility];  Service: Endoscopy;  Laterality: N/A;   BIOPSY  07/17/2020   Procedure: BIOPSY;  Surgeon: Dolores Frame, MD;  [facility];  Service: Gastroenterology;;   CARDIAC CATHETERIZATION  01/15/2013   CARDIAC CATHETERIZATION N/A 05/16/2014   Procedure: Left Heart Cath and Coronary Angiography;  Surgeon: Peter M Swaziland, MD;  Location: [facility]ASIVE CV [facility];  Service: Cardiovascular;  Laterality: N/A;   COLONOSCOPY N/A 05/02/2013   Procedure: COLONOSCOPY;  Surgeon: Malissa Hippo, MD;  [facility];  Service: Endoscopy;  Laterality: N/A;  200-moved to 1200 Ann notified pt   COLONOSCOPY WITH PROPOFOL N/A 05/08/2020   Procedure: COLONOSCOPY WITH PROPOFOL;  Surgeon: Dolores Frame, MD;  [facility];  Service: Gastroenterology;  Laterality: N/A;  AM   COLONOSCOPY WITH PROPOFOL N/A 06/19/2020   Procedure: COLONOSCOPY WITH PROPOFOL;  Surgeon: Dolores Frame, MD;  [facility];  Service: Gastroenterology;  Laterality: N/A;  [id number]   COLONOSCOPY WITH PROPOFOL N/A 07/17/2020   Procedure: COLONOSCOPY WITH PROPOFOL;  Surgeon: Dolores Frame, MD;  [facility];  Service: Gastroenterology;  Laterality: N/A;  9:00   CORONARY STENT PLACEMENT     2003, 2004   ESOPHAGOGASTRODUODENOSCOPY N/A 05/02/2013   Procedure: ESOPHAGOGASTRODUODENOSCOPY (EGD);  Surgeon: Malissa Hippo, MD;  [facility];  Service: Endoscopy;  Laterality: N/A;   HEMORRHOID SURGERY  2022   HEMOSTASIS CLIP PLACEMENT  07/17/2020   Procedure: HEMOSTASIS CLIP PLACEMENT;  Surgeon: Dolores Frame, MD;  [facility];  Service: Gastroenterology;;   HIP  ARTHROPLASTY     KNEE SURGERY Left 12/03/2013   LEFT HEART CATHETERIZATION WITH CORONARY ANGIOGRAM N/A 01/15/2013   Procedure: LEFT HEART CATHETERIZATION WITH CORONARY ANGIOGRAM;  Surgeon: Lesleigh Noe, MD;  Location: [facility] CATH LAB;  Service: Cardiovascular;  Laterality: N/A;   MALONEY DILATION N/A 05/02/2013   Procedure: Elease Hashimoto DILATION;  Surgeon: Malissa Hippo, MD;  [facility];  Service: Endoscopy;  Laterality: N/A;   POLYPECTOMY  07/17/2020   Procedure: POLYPECTOMY;  Surgeon: Dolores Frame, MD;  [facility];  Service: Gastroenterology;;   Gaspar Bidding DILATION N/A 05/02/2013   Procedure: Gaspar Bidding DILATION;  Surgeon: Malissa Hippo, MD;  [facility];  Service: Endoscopy;  Laterality: N/A;   TONSILLECTOMY     Social History   Occupational History   Occupation: Patent attorney: Barrister's clerk   Occupation: Retired   Tobacco Use   Smoking status: Former    Current packs/day: 0.00    Average packs/day: 3.0 packs/day for 20.0 years (60.0 ttl pk-yrs)    Types: Cigarettes    Start date: 05/26/1955    Quit date: 05/26/1975    Years since quitting: 47.4   Smokeless tobacco: Current    Types: Chew  Vaping Use   Vaping status: Never Used  Substance and Sexual Activity   Alcohol use: No   Drug use: No   Sexual activity: Not Currently

## 2022-11-07 DIAGNOSIS — M25561 Pain in right knee: Secondary | ICD-10-CM | POA: Diagnosis not present

## 2022-11-07 DIAGNOSIS — M25461 Effusion, right knee: Secondary | ICD-10-CM | POA: Diagnosis not present

## 2022-11-07 DIAGNOSIS — M1711 Unilateral primary osteoarthritis, right knee: Secondary | ICD-10-CM | POA: Diagnosis not present

## 2022-11-07 DIAGNOSIS — S83241A Other tear of medial meniscus, current injury, right knee, initial encounter: Secondary | ICD-10-CM | POA: Diagnosis not present

## 2022-12-16 ENCOUNTER — Encounter: Payer: Self-pay | Admitting: Family Medicine

## 2022-12-16 ENCOUNTER — Other Ambulatory Visit: Payer: Self-pay | Admitting: Family Medicine

## 2022-12-16 ENCOUNTER — Ambulatory Visit (INDEPENDENT_AMBULATORY_CARE_PROVIDER_SITE_OTHER): Payer: Medicare Other | Admitting: Family Medicine

## 2022-12-16 VITALS — BP 134/70 | HR 66 | Ht 71.0 in | Wt 208.0 lb

## 2022-12-16 DIAGNOSIS — G8929 Other chronic pain: Secondary | ICD-10-CM

## 2022-12-16 DIAGNOSIS — I10 Essential (primary) hypertension: Secondary | ICD-10-CM | POA: Diagnosis not present

## 2022-12-16 DIAGNOSIS — E782 Mixed hyperlipidemia: Secondary | ICD-10-CM | POA: Diagnosis not present

## 2022-12-16 DIAGNOSIS — E079 Disorder of thyroid, unspecified: Secondary | ICD-10-CM

## 2022-12-16 DIAGNOSIS — Z0289 Encounter for other administrative examinations: Secondary | ICD-10-CM | POA: Diagnosis not present

## 2022-12-16 DIAGNOSIS — M4712 Other spondylosis with myelopathy, cervical region: Secondary | ICD-10-CM

## 2022-12-16 LAB — LIPID PANEL

## 2022-12-16 MED ORDER — OXYCODONE-ACETAMINOPHEN 7.5-325 MG PO TABS: 1.0000 | ORAL_TABLET | Freq: Four times a day (QID) | ORAL | 0 refills | Status: DC | PRN

## 2022-12-16 NOTE — Progress Notes (Signed)
BP 134/70   Pulse 66   Ht 5\' 11"  (1.803 m)   Wt 208 lb (94.3 kg)   SpO2 95%   BMI 29.01 kg/m    Subjective:   Patient ID: [name], male    DOB: [birth year], 77 y.o.   MRN: [id number]  HPI: [name] is a 77 y.o. male presenting on 12/16/2022 for Medical Management of Chronic Issues, Hypertension, and Pain   HPI Pain assessment: Cause of pain-osteoarthritis of cervical spine and knees Pain location-lower back and upper back and knees and neck Pain on scale of 1-10- 5 Frequency-daily What increases pain-winter weather makes it slightly worse What makes pain Better-medication Effects on ADL -minimal Any change in general medical condition-none  Current opioids rx-oxycodone-acetaminophen 7.5-325  -4 times daily as needed # meds rx-120/month Effectiveness of current meds-working well Adverse reactions from pain meds-none Morphine equivalent-45  Pill count performed-No Last drug screen -06/29/2022 ( high risk q61m, moderate risk q43m, low risk yearly ) Urine drug screen today- No Was the N[facility] reviewed-yes  If yes were their any concerning findings? -None  Pain contract signed on: 06/09/2022  Hypertension Patient is currently on amlodipine and lisinopril, and their blood pressure today is 134/70. Patient denies any lightheadedness or dizziness. Patient denies headaches, blurred vision, chest pains, shortness of breath, or weakness. Denies any side effects from medication and is content with current medication.   Hypothyroidism recheck Patient is coming in for thyroid recheck today as well. They deny any issues with hair changes or heat or cold problems or diarrhea or constipation. They deny any chest pain or palpitations. They are currently on levothyroxine 200 micrograms   Hyperlipidemia Patient is coming in for recheck of his hyperlipidemia. The patient is currently taking simvastatin. They deny any issues with myalgias or history of liver damage from it.  They deny any focal numbness or weakness or chest pain.   Relevant past medical, surgical, family and social history reviewed and updated as indicated. Interim medical history since our last visit reviewed. Allergies and medications reviewed and updated.  Review of Systems  Constitutional:  Negative for chills and fever.  Eyes:  Negative for visual disturbance.  Respiratory:  Negative for shortness of breath and wheezing.   Cardiovascular:  Negative for chest pain and leg swelling.  Musculoskeletal:  Negative for back pain and gait problem.  Skin:  Negative for rash.  Neurological:  Negative for dizziness, weakness and light-headedness.  All other systems reviewed and are negative.   Per HPI unless specifically indicated above   Allergies as of 12/16/2022   No Known Allergies      Medication List        Accurate as of December 16, 2022 10:20 AM. If you have any questions, ask your nurse or doctor.          albuterol 108 (90 Base) MCG/ACT inhaler Commonly known as: ProAir HFA Inhale 2 puffs into the lungs every 6 (six) hours as needed for wheezing or shortness of breath.   amLODipine 10 MG tablet Commonly known as: NORVASC Take 1 tablet (10 mg total) by mouth daily.   aspirin EC 81 MG tablet Take 1 tablet (81 mg total) by mouth daily.   levothyroxine 200 MCG tablet Commonly known as: SYNTHROID TAKE ONE TABLET ONCE DAILY   lisinopril 20 MG tablet Commonly known as: ZESTRIL Take 1 tablet (20 mg total) by mouth daily.   multivitamin with minerals Tabs tablet Take 1 tablet by mouth  daily.   naloxegol oxalate 12.5 MG Tabs tablet Commonly known as: Movantik Take 1 tablet (12.5 mg total) by mouth daily.   nitroGLYCERIN 0.4 MG SL tablet Commonly known as: NITROSTAT Place 1 tablet (0.4 mg total) under the tongue every 5 (five) minutes as needed for chest pain.   oxyCODONE-acetaminophen 7.5-325 MG tablet Commonly known as: PERCOCET Take 1 tablet by mouth every 6  (six) hours as needed for severe pain (pain score 7-10). What changed: Another medication with the same name was changed. Make sure you understand how and when to take each. Changed by: Elige Radon Stasha Naraine   oxyCODONE-acetaminophen 7.5-325 MG tablet Commonly known as: PERCOCET Take 1 tablet by mouth every 6 (six) hours as needed for severe pain (pain score 7-10). Start taking on: January 15, 2023 What changed: These instructions start on January 15, 2023. If you are unsure what to do until then, ask your doctor or other care provider. Changed by: Elige Radon Jashanti Clinkscale   oxyCODONE-acetaminophen 7.5-325 MG tablet Commonly known as: PERCOCET Take 1 tablet by mouth every 6 (six) hours as needed for severe pain (pain score 7-10). Start taking on: February 14, 2023 What changed: These instructions start on February 14, 2023. If you are unsure what to do until then, ask your doctor or other care provider. Changed by: Elige Radon Semaj Coburn   sertraline 50 MG tablet Commonly known as: ZOLOFT TAKE 1 TABLET DAILY   sildenafil 20 MG tablet Commonly known as: REVATIO TAKE UP TO 3 TABLETS AS DIRECTED WHEN NEEDED   simvastatin 40 MG tablet Commonly known as: ZOCOR TAKE 1 TABLET AT BEDTIM   VITAMIN B-12 PO Take 2,500 mcg by mouth daily.         Objective:   BP 134/70   Pulse 66   Ht 5\' 11"  (1.803 m)   Wt 208 lb (94.3 kg)   SpO2 95%   BMI 29.01 kg/m   Wt Readings from Last 3 Encounters:  12/16/22 208 lb (94.3 kg)  10/26/22 210 lb (95.3 kg)  10/20/22 210 lb (95.3 kg)    Physical Exam Vitals and nursing note reviewed.  Constitutional:      General: He is not in acute distress.    Appearance: He is well-developed. He is not diaphoretic.  Eyes:     General: No scleral icterus.    Conjunctiva/sclera: Conjunctivae normal.  Neck:     Thyroid: No thyromegaly.  Cardiovascular:     Rate and Rhythm: Normal rate and regular rhythm.     Heart sounds: Normal heart sounds. No murmur  heard. Pulmonary:     Effort: Pulmonary effort is normal. No respiratory distress.     Breath sounds: Normal breath sounds. No wheezing.  Musculoskeletal:        General: No swelling. Normal range of motion.     Cervical back: Neck supple.  Lymphadenopathy:     Cervical: No cervical adenopathy.  Skin:    General: Skin is warm and dry.     Findings: No rash.  Neurological:     Mental Status: He is alert and oriented to person, place, and time.     Coordination: Coordination normal.  Psychiatric:        Behavior: Behavior normal.       Assessment & Plan:   Problem List Items Addressed This Visit       Cardiovascular and Mediastinum   Essential hypertension - Primary   Relevant Orders   CBC with Differential/Platelet   CMP14+EGFR  Lipid panel     Endocrine   Thyroid disease   Relevant Orders   CBC with Differential/Platelet   CMP14+EGFR   Lipid panel   TSH     Nervous and Auditory   Osteoarthritis of cervical spine with myelopathy   Relevant Medications   oxyCODONE-acetaminophen (PERCOCET) 7.5-325 MG tablet (Start on 01/15/2023)   oxyCODONE-acetaminophen (PERCOCET) 7.5-325 MG tablet (Start on 02/14/2023)   oxyCODONE-acetaminophen (PERCOCET) 7.5-325 MG tablet     Other   Hyperlipidemia   Relevant Orders   CBC with Differential/Platelet   CMP14+EGFR   Lipid panel   Pain management contract signed   Relevant Medications   oxyCODONE-acetaminophen (PERCOCET) 7.5-325 MG tablet (Start on 01/15/2023)   oxyCODONE-acetaminophen (PERCOCET) 7.5-325 MG tablet (Start on 02/14/2023)   oxyCODONE-acetaminophen (PERCOCET) 7.5-325 MG tablet   Other Visit Diagnoses       Encounter for chronic pain management       Relevant Medications   oxyCODONE-acetaminophen (PERCOCET) 7.5-325 MG tablet (Start on 01/15/2023)   oxyCODONE-acetaminophen (PERCOCET) 7.5-325 MG tablet (Start on 02/14/2023)   oxyCODONE-acetaminophen (PERCOCET) 7.5-325 MG tablet       Continue current medicine,  seems to be doing well, will do blood work today. Follow up plan: Return in about 3 months (around 03/16/2023), or if symptoms worsen or fail to improve, for Pain management recheck.  Counseling provided for all of the vaccine components Orders Placed This Encounter  Procedures   CBC with Differential/Platelet   CMP14+EGFR   Lipid panel   TSH    Arville Care, MD [facility] 12/16/2022, 10:20 AM

## 2022-12-17 LAB — CMP14+EGFR
ALT: 14 IU/L (ref 0–44)
AST: 18 IU/L (ref 0–40)
Albumin: 4.4 g/dL (ref 3.8–4.8)
Alkaline Phosphatase: 60 IU/L (ref 44–121)
BUN/Creatinine Ratio: 16 (ref 10–24)
BUN: 14 mg/dL (ref 8–27)
Bilirubin Total: 0.4 mg/dL (ref 0.0–1.2)
CO2: 23 mmol/L (ref 20–29)
Calcium: 9.5 mg/dL (ref 8.6–10.2)
Chloride: 104 mmol/L (ref 96–106)
Creatinine, Ser: 0.87 mg/dL (ref 0.76–1.27)
Globulin, Total: 1.7 g/dL (ref 1.5–4.5)
Glucose: 98 mg/dL (ref 70–99)
Potassium: 4.5 mmol/L (ref 3.5–5.2)
Sodium: 142 mmol/L (ref 134–144)
Total Protein: 6.1 g/dL (ref 6.0–8.5)
eGFR: 88 mL/min/{1.73_m2} (ref >59)

## 2022-12-17 LAB — CBC WITH DIFFERENTIAL/PLATELET
Basophils Absolute: 0 10*3/uL (ref 0.0–0.2)
Basos: 0 %
EOS (ABSOLUTE): 0.1 10*3/uL (ref 0.0–0.4)
Eos: 2 %
Hematocrit: 44.6 % (ref 37.5–51.0)
Hemoglobin: 14.6 g/dL (ref 13.0–17.7)
Immature Grans (Abs): 0 10*3/uL (ref 0.0–0.1)
Immature Granulocytes: 0 %
Lymphocytes Absolute: 1.9 10*3/uL (ref 0.7–3.1)
Lymphs: 26 %
MCH: 31 pg (ref 26.6–33.0)
MCHC: 32.7 g/dL (ref 31.5–35.7)
MCV: 95 fL (ref 79–97)
Monocytes Absolute: 0.7 10*3/uL (ref 0.1–0.9)
Monocytes: 9 %
Neutrophils Absolute: 4.5 10*3/uL (ref 1.4–7.0)
Neutrophils: 63 %
Platelets: 287 10*3/uL (ref 150–450)
RBC: 4.71 x10E6/uL (ref 4.14–5.80)
RDW: 13.7 % (ref 11.6–15.4)
WBC: 7.2 10*3/uL (ref 3.4–10.8)

## 2022-12-17 LAB — TSH: TSH: 3.36 u[IU]/mL (ref 0.450–4.500)

## 2022-12-17 LAB — LIPID PANEL
Cholesterol, Total: 134 mg/dL (ref 100–199)
HDL: 57 mg/dL (ref >39)
LDL CALC COMMENT:: 2.4 ratio (ref 0.0–5.0)
LDL Chol Calc (NIH): 62 mg/dL (ref 0–99)
Triglycerides: 79 mg/dL (ref 0–149)
VLDL Cholesterol Cal: 15 mg/dL (ref 5–40)

## 2023-02-05 NOTE — Progress Notes (Signed)
 Cardiology Office Note:    Date:  02/08/2023   ID:  Charles, Frey 1945-12-02, MRN [id number]  PCP:  Dettinger, Fonda LABOR, MD   [facility] Providers Cardiologist:  Jacole Capley, MD     Referring MD: Dettinger, Fonda LABOR, MD   Chief Complaint  Patient presents with   Coronary Artery Disease     History of Present Illness:    [name] is a 78 y.o. male with a hx of CAD, hyperlipidemia, hypothyroidism, hypertension and history of obstructive sleep apnea.  He underwent stenting of OM 3 with bare-metal stent in 2001.  Repeat cardiac catheterization in 2003 revealed no significant obstructive disease.  He underwent stenting of the proximal left circumflex with Taxus stent in 2007.  Cardiac catheterization in 2010 demonstrated 70% disease in RCA, patent stent, he was treated medically.  Myoview  in November 2014 still showed a small lateral fixed defect consistent with scar or artifact, no ischemia, EF 59%.  Last cardiac catheterization in May 2016 showed nonobstructive disease, patent stent.  Last echocardiogram obtained on 07/26/2018 showed EF 56%, large defect of moderate severity present in the basal inferoseptal, basal inferior, mid inferoseptal, mid inferior, apical septal, and apical inferior location, suspect attenuation artifact, no reversible ischemia.  Overall low risk study.  He follows Dr. Alaine for COPD and emphysema.    On follow up today he is doing well. Denies any chest pain. He only notes mild SOB walking up a steep hill. No palpitations or edema. He stays active doing yard work and gardening and playing golf 3 times a week. He may need knee surgery. Seeing Dr Barbarann.  Past Medical History:  Diagnosis Date   Anginal pain (HCC)    Arthritis    OSTEO BACK KNEES HIPS & HANDS   Arthropathy, unspecified, site unspecified    Chronic airway obstruction, not elsewhere classified    Coronary atherosclerosis of unspecified type of vessel, native or graft    a. LHC  5/16:  mLAD 20%, CFX stent ok, OM3 stent ok, mRCA 30%, normal LVF   Headache(784.0)    Hx of echocardiogram    a. Echo 5/16:  mild LVH, EF 60%, mildly dilated Ao root (40 mm)   Hypothyroidism    Other and unspecified hyperlipidemia    Thyroid  disease    hypothyroid   Unspecified essential hypertension    Unspecified sleep apnea    Dr. Cyrena at [facility] told pt he had sleep apnea, but never sent him for a sleep study so he doesn't wear a CPAP    Past Surgical History:  Procedure Laterality Date   ANKLE SURGERY     BALLOON DILATION N/A 05/02/2013   Procedure: BALLOON DILATION;  Surgeon: Claudis RAYMOND Rivet, MD;  [facility];  Service: Endoscopy;  Laterality: N/A;   BIOPSY  07/17/2020   Procedure: BIOPSY;  Surgeon: Eartha Angelia Sieving, MD;  [facility];  Service: Gastroenterology;;   CARDIAC CATHETERIZATION  01/15/2013   CARDIAC CATHETERIZATION N/A 05/16/2014   Procedure: Left Heart Cath and Coronary Angiography;  Surgeon: Elayjah Chaney M Vineet Kinney, MD;  Location: [facility] INVASIVE CV LAB;  Service: Cardiovascular;  Laterality: N/A;   COLONOSCOPY N/A 05/02/2013   Procedure: COLONOSCOPY;  Surgeon: Claudis RAYMOND Rivet, MD;  [facility];  Service: Endoscopy;  Laterality: N/A;  200-moved to 1200 Ann notified pt   COLONOSCOPY WITH PROPOFOL  N/A 05/08/2020   Procedure: COLONOSCOPY WITH PROPOFOL ;  Surgeon: Eartha Angelia Sieving, MD;  [facility];  Service: Gastroenterology;  Laterality: N/A;  AM   COLONOSCOPY WITH PROPOFOL  N/A 06/19/2020   Procedure: COLONOSCOPY WITH PROPOFOL ;  Surgeon: Eartha Angelia Sieving, MD;  [facility];  Service: Gastroenterology;  Laterality: N/A;  [id number]   COLONOSCOPY WITH PROPOFOL  N/A 07/17/2020   Procedure: COLONOSCOPY WITH PROPOFOL ;  Surgeon: Eartha Angelia Sieving, MD;  [facility];  Service: Gastroenterology;  Laterality: N/A;  9:00   CORONARY STENT PLACEMENT     2003, 2004    ESOPHAGOGASTRODUODENOSCOPY N/A 05/02/2013   Procedure: ESOPHAGOGASTRODUODENOSCOPY (EGD);  Surgeon: Claudis RAYMOND Rivet, MD;  [facility];  Service: Endoscopy;  Laterality: N/A;   HEMORRHOID SURGERY  2022   HEMOSTASIS CLIP PLACEMENT  07/17/2020   Procedure: HEMOSTASIS CLIP PLACEMENT;  Surgeon: Eartha Angelia Sieving, MD;  [facility];  Service: Gastroenterology;;   HIP ARTHROPLASTY     KNEE SURGERY Left 12/03/2013   LEFT HEART CATHETERIZATION WITH CORONARY ANGIOGRAM N/A 01/15/2013   Procedure: LEFT HEART CATHETERIZATION WITH CORONARY ANGIOGRAM;  Surgeon: Victory LELON Claudene DOUGLAS, MD;  Location: [facility] CATH LAB;  Service: Cardiovascular;  Laterality: N/A;   MALONEY DILATION N/A 05/02/2013   Procedure: AGAPITO DILATION;  Surgeon: Claudis RAYMOND Rivet, MD;  [facility];  Service: Endoscopy;  Laterality: N/A;   POLYPECTOMY  07/17/2020   Procedure: POLYPECTOMY;  Surgeon: Eartha Angelia Sieving, MD;  [facility];  Service: Gastroenterology;;   HARLEY DILATION N/A 05/02/2013   Procedure: HARLEY DILATION;  Surgeon: Claudis RAYMOND Rivet, MD;  [facility];  Service: Endoscopy;  Laterality: N/A;   TONSILLECTOMY      Current Medications: Current Meds  Medication Sig   albuterol  (PROAIR  HFA) 108 (90 BASE) MCG/ACT inhaler Inhale 2 puffs into the lungs every 6 (six) hours as needed for wheezing or shortness of breath.   amLODipine  (NORVASC ) 10 MG tablet Take 1 tablet (10 mg total) by mouth daily.   aspirin  EC 81 MG tablet Take 1 tablet (81 mg total) by mouth daily.   Cyanocobalamin (VITAMIN B-12 PO) Take 2,500 mcg by mouth daily.   levothyroxine  (SYNTHROID ) 200 MCG tablet TAKE ONE TABLET ONCE DAILY   lisinopril  (ZESTRIL ) 20 MG tablet Take 1 tablet (20 mg total) by mouth daily.   Multiple Vitamin (MULTIVITAMIN WITH MINERALS) TABS tablet Take 1 tablet by mouth daily.   naloxegol  oxalate (MOVANTIK ) 12.5 MG TABS tablet Take 1 tablet (12.5 mg total) by mouth daily.    oxyCODONE -acetaminophen  (PERCOCET) 7.5-325 MG tablet Take 1 tablet by mouth every 6 (six) hours as needed for severe pain (pain score 7-10).   sertraline  (ZOLOFT ) 50 MG tablet TAKE 1 TABLET DAILY   sildenafil  (REVATIO ) 20 MG tablet TAKE UP TO 3 TABLETS AS DIRECTED WHEN NEEDED   simvastatin  (ZOCOR ) 40 MG tablet TAKE 1 TABLET AT BEDTIM     Allergies:   Patient has no known allergies.   Social History   Socioeconomic History   Marital status: Widowed    Spouse name: Not on file   Number of children: 2   Years of education: Not on file   Highest education level: 11th grade  Occupational History   Occupation: Patent Attorney: [employer]   Occupation: Retired   Tobacco Use   Smoking status: Former    Current packs/day: 0.00    Average packs/day: 3.0 packs/day for 20.0 years (60.0 ttl pk-yrs)    Types: Cigarettes    Start date: 05/26/1955    Quit  date: 05/26/1975    Years since quitting: 47.7   Smokeless tobacco: Current    Types: Chew  Vaping Use   Vaping status: Never Used  Substance and Sexual Activity   Alcohol use: No   Drug use: No   Sexual activity: Not Currently  Other Topics Concern   Not on file  Social History Narrative   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 - 2 children one boy,one girl.    Son is living with him right now - 2023   Social Drivers of Health   Financial Resource Strain: Low Risk  (05/05/2022)   Overall Financial Resource Strain (CARDIA)    Difficulty of Paying Living Expenses: Not hard at all  Food Insecurity: No Food Insecurity (05/05/2022)   Hunger Vital Sign    Worried About Running Out of Food in the Last Year: Never true    Ran Out of Food in the Last Year: Never true  Transportation Needs: No Transportation Needs (05/05/2022)   PRAPARE - Administrator, Civil Service (Medical): No    Lack of Transportation (Non-Medical): No  Physical Activity: Insufficiently Active (05/05/2022)    Exercise Vital Sign    Days of Exercise per Week: 3 days    Minutes of Exercise per Session: 30 min  Stress: No Stress Concern Present (05/05/2022)   [employer] of Occupational Health - Occupational Stress Questionnaire    Feeling of Stress : Not at all  Social Connections: Moderately Isolated (05/05/2022)   Social Connection and Isolation Panel [NHANES]    Frequency of Communication with Friends and Family: More than three times a week    Frequency of Social Gatherings with Friends and Family: More than three times a week    Attends Religious Services: More than 4 times per year    Active Member of [employer] or Organizations: No    Attends Banker Meetings: Never    Marital Status: Widowed     Family History: The patient's family history includes CAD in his father; Stroke in his mother. There is no history of Colon cancer.  ROS:   Please see the history of present illness.     All other systems reviewed and are negative.  EKGs/Labs/Other Studies Reviewed:    The following studies were reviewed today:  Myoview  07/26/2018 The left ventricular ejection fraction is normal (55-65%). Nuclear stress EF: 56%. No T wave inversion was noted during stress. There was no ST segment deviation noted during stress. Defect 1: There is a large defect of moderate severity present in the basal inferoseptal, basal inferior, mid inferoseptal, mid inferior, apical septal and apical inferior location. This is a low risk study.   Large size, moderate severity mostly fixed inferoseptal perfusion defect (SDS 2), suspect attenuation artifact. No reversible ischemia. LVEF 56% with normal wall motion. This is a low risk study.  EKG Interpretation Date/Time:  Wednesday February 08 2023 09:13:49 EST Ventricular Rate:  66 PR Interval:  196 QRS Duration:  86 QT Interval:  384 QTC Calculation: 402 R Axis:   -49  Text Interpretation: Normal sinus rhythm Left axis deviation When compared with  ECG of 06-Jul-2018 11:42, Borderline criteria for Anterior infarct are no longer Present Confirmed by Journee Kohen [phone]) on 02/08/2023 9:41:12 AM     Recent Labs: 12/16/2022: ALT 14; BUN 14; Creatinine, Ser 0.87; Hemoglobin 14.6; Platelets 287; Potassium 4.5; Sodium 142; TSH 3.360  Recent Lipid Panel    Component  Value Date/Time   CHOL 134 12/16/2022 1019   CHOL 160 04/16/2012 1647   TRIG 79 12/16/2022 1019   TRIG 250 (H) 04/04/2013 0952   TRIG 165 (H) 04/16/2012 1647   HDL 57 12/16/2022 1019   HDL 42 04/04/2013 0952   HDL 51 04/16/2012 1647   CHOLHDL 2.4 12/16/2022 1019   CHOLHDL 3.0 05/16/2014 0115   VLDL 16 05/16/2014 0115   LDLCALC 62 12/16/2022 1019   LDLCALC 53 04/04/2013 0952   LDLCALC 76 04/16/2012 1647   LDLDIRECT 70.0 04/12/2006 1050     Risk Assessment/Calculations:           Physical Exam:    VS:  BP 116/68 (Cuff Size: Normal)   Pulse 66   Ht 6' (1.829 m)   Wt 219 lb (99.3 kg)   SpO2 96%   BMI 29.70 kg/m     Wt Readings from Last 3 Encounters:  02/08/23 219 lb (99.3 kg)  12/16/22 208 lb (94.3 kg)  10/26/22 210 lb (95.3 kg)     GEN:  Well nourished, well developed in no acute distress HEENT: Normal NECK: No JVD; No carotid bruits LYMPHATICS: No lymphadenopathy CARDIAC: RRR, no murmurs, rubs, gallops RESPIRATORY:  Clear to auscultation without rales, wheezing or rhonchi  ABDOMEN: Soft, non-tender, non-distended MUSCULOSKELETAL:  No edema; No deformity  SKIN: Warm and dry NEUROLOGIC:  Alert and oriented x 3 PSYCHIATRIC:  Normal affect   ASSESSMENT:    1. Coronary artery disease involving native coronary artery of native heart without angina pectoris   2. Essential hypertension      PLAN:    In order of problems listed above:  CAD: Remote stenting of OM in 2001 and proximal LCx in 2007. Nonobstructive disease on cath in 2016. He is asymptomatic. On ASA and statin. If he needs Knee surgery he is clear from a CV standpoint.    Hypertension: excellent control.   Hyperlipidemia: On Zocor  40 mg daily. LDL at goal 62  Hypothyroidism: On levothyroxine .  TSH normal.    Will continue current therapy. Follow up in one year.    Medication Adjustments/Labs and Tests Ordered: Current medicines are reviewed at length with the patient today.  Concerns regarding medicines are outlined above.  Orders Placed This Encounter  Procedures   EKG 12-Lead    Meds ordered this encounter  Medications   nitroGLYCERIN  (NITROSTAT ) 0.4 MG SL tablet    Sig: Place 1 tablet (0.4 mg total) under the tongue every 5 (five) minutes as needed for chest pain.    Dispense:  25 tablet    Refill:  3     There are no Patient Instructions on file for this visit.   Signed, Lanaiya Lantry, MD  02/08/2023 9:48 AM    [facility]

## 2023-02-08 ENCOUNTER — Ambulatory Visit: Payer: Medicare Other | Attending: Cardiology | Admitting: Cardiology

## 2023-02-08 ENCOUNTER — Encounter: Payer: Self-pay | Admitting: Cardiology

## 2023-02-08 VITALS — BP 116/68 | HR 66 | Ht 72.0 in | Wt 219.0 lb

## 2023-02-08 DIAGNOSIS — E785 Hyperlipidemia, unspecified: Secondary | ICD-10-CM | POA: Diagnosis not present

## 2023-02-08 DIAGNOSIS — I2510 Atherosclerotic heart disease of native coronary artery without angina pectoris: Secondary | ICD-10-CM

## 2023-02-08 DIAGNOSIS — I10 Essential (primary) hypertension: Secondary | ICD-10-CM | POA: Diagnosis not present

## 2023-02-08 MED ORDER — NITROGLYCERIN 0.4 MG SL SUBL: 0.4000 mg | SUBLINGUAL_TABLET | SUBLINGUAL | 3 refills | Status: AC | PRN

## 2023-02-08 NOTE — Patient Instructions (Signed)
 Medication Instructions:  Continue same medications *If you need a refill on your cardiac medications before your next appointment, please call your pharmacy*   Lab Work: None ordered   Testing/Procedures: None ordered   Follow-Up: At [facility], you and your health needs are our priority.  As part of our continuing mission to provide you with exceptional heart care, we have created designated Provider Care Teams.  These Care Teams include your primary Cardiologist (physician) and Advanced Practice Providers (APPs -  Physician Assistants and Nurse Practitioners) who all work together to provide you with the care you need, when you need it.  We recommend signing up for the patient portal called "MyChart".  Sign up information is provided on this After Visit Summary.  MyChart is used to connect with patients for Virtual Visits (Telemedicine).  Patients are able to view lab/test results, encounter notes, upcoming appointments, etc.  Non-urgent messages can be sent to your provider as well.   To learn more about what you can do with MyChart, go to [email or web address].    Your next appointment:  1 year    Call in Oct to schedule Feb appointment     Provider: Dr.Jordan

## 2023-02-09 ENCOUNTER — Telehealth: Payer: Self-pay | Admitting: Radiology

## 2023-02-09 ENCOUNTER — Ambulatory Visit: Payer: Medicare Other | Admitting: Orthopaedic Surgery

## 2023-02-09 ENCOUNTER — Encounter: Payer: Self-pay | Admitting: Orthopaedic Surgery

## 2023-02-09 VITALS — Ht 72.0 in | Wt 219.0 lb

## 2023-02-09 DIAGNOSIS — M1711 Unilateral primary osteoarthritis, right knee: Secondary | ICD-10-CM | POA: Diagnosis not present

## 2023-02-09 NOTE — Progress Notes (Signed)
 Office Visit Note   Patient: [name]           Date of Birth: [birth year]           MRN: [id number] Visit Date: 02/09/2023              Requested by: Dettinger, Fonda LABOR, MD [address] [place],  [place] [address] PCP: Dettinger, Fonda LABOR, MD   Assessment & Plan: Visit Diagnoses:  1. Unilateral primary osteoarthritis, right knee     Plan: Knee arthritis with some small meniscal fraying small tears no sublux fragments.  Knee arthritis is not quite severe enough to consider total joint arthroplasty.  Will seek approval for Visco injection to his right knee see if he can get some good relief with this.  Patient will return once we have approval for Visco injection right knee.  Follow-Up Instructions: No follow-ups on file.   Orders:  No orders of the defined types were placed in this encounter.  No orders of the defined types were placed in this encounter.     Procedures: No procedures performed   Clinical Data: No additional findings.   Subjective: Chief Complaint  Patient presents with   Right Knee - Pain, Follow-up    MRI review    HPI 78 year old male returns with ongoing problems with right knee pain.  MRI scan has been obtained which shows some partial cartilage wear tricompartmental.  He has some meniscal tearing but no subluxed meniscal fragments.  He has had persistent pain and swelling he is taken anti-inflammatories without relief he has had intra-articular injection without sustained relief.  Additional problems with sleep apnea.  Pain contract on oxycodone  7.5/325       120 tablets monthly.  Review of Systems all systems noncontributory to HPI.   Objective: Vital Signs: Ht 6' (1.829 m)   Wt 219 lb (99.3 kg)   BMI 29.70 kg/m   Physical Exam Constitutional:      Appearance: He is well-developed.  HENT:     Head: Normocephalic and atraumatic.     Right Ear: External ear normal.     Left Ear: External ear normal.  Eyes:     Pupils: Pupils  are equal, round, and reactive to light.  Neck:     Thyroid : No thyromegaly.     Trachea: No tracheal deviation.  Cardiovascular:     Rate and Rhythm: Normal rate.  Pulmonary:     Effort: Pulmonary effort is normal.     Breath sounds: No wheezing.  Abdominal:     General: Bowel sounds are normal.     Palpations: Abdomen is soft.  Musculoskeletal:     Cervical back: Neck supple.  Skin:    General: Skin is warm and dry.     Capillary Refill: Capillary refill takes less than 2 seconds.  Neurological:     Mental Status: He is alert and oriented to person, place, and time.  Psychiatric:        Behavior: Behavior normal.        Thought Content: Thought content normal.        Judgment: Judgment normal.     Ortho Exam 2+ knee effusion crepitus with knee range of motion medial lateral joint line tenderness full extension.  Negative logroll hips palpable pedal pulse.  Sensation the foot is intact.  Specialty Comments:  No specialty comments available.  Imaging: Narrative  CLINICAL DATA:  Posterior right knee pain  EXAM: MRI OF THE LOWER  RIGHT JOINT WITHOUT CONTRAST  TECHNIQUE: Multiplanar, multisequence MR imaging of the right knee was performed. No intravenous contrast was administered.  COMPARISON:  None Available.  FINDINGS: MENISCI  Medial meniscus: Incomplete radial tear of the medial meniscal posterior horn (series 3, image 19). Focal degeneration and/or partial tearing at the posterior root attachment site (series 3, image 15).  Lateral meniscus: Posterior root attachment of the lateral meniscus is ill-defined and may be partially torn (series 3, image 12). Blunting of the free edge of the midbody.  LIGAMENTS  Cruciates: Intact ACL and PCL.  Collaterals: Intact MCL. Lateral collateral ligament complex intact.  CARTILAGE  Patellofemoral: Moderate chondral thinning and surface irregularity within the patellofemoral compartment.  Medial: Mild  partial-thickness chondral thinning and surface irregularity of the weight-bearing medial compartment.  Lateral: Partial thickness chondral surface irregularity of the lateral tibial plateau centrally.  MISCELLANEOUS  Joint:  Trace knee joint effusion. Fat pads within normal limits.  Popliteal Fossa:  No Baker's cyst. Intact popliteus tendon.  Extensor Mechanism:  Intact quadriceps and patellar tendons.  Bones: No acute fracture. No dislocation. Mild reactive marrow edema underlying the medial meniscal posterior root attachment site. No marrow replacing bone lesion.  Other: No significant periarticular soft tissue findings. Procedure Note  Plundo, Mabel Kuba, DO - 11/22/2022 Formatting of this note might be different from the original. CLINICAL DATA:  Posterior right knee pain  EXAM: MRI OF THE LOWER RIGHT JOINT WITHOUT CONTRAST  TECHNIQUE: Multiplanar, multisequence MR imaging of the right knee was performed. No intravenous contrast was administered.  COMPARISON:  None Available.  FINDINGS: MENISCI  Medial meniscus: Incomplete radial tear of the medial meniscal posterior horn (series 3, image 19). Focal degeneration and/or partial tearing at the posterior root attachment site (series 3, image 15).  Lateral meniscus: Posterior root attachment of the lateral meniscus is ill-defined and may be partially torn (series 3, image 12). Blunting of the free edge of the midbody.  LIGAMENTS  Cruciates: Intact ACL and PCL.  Collaterals: Intact MCL. Lateral collateral ligament complex intact.  CARTILAGE  Patellofemoral: Moderate chondral thinning and surface irregularity within the patellofemoral compartment.  Medial: Mild partial-thickness chondral thinning and surface irregularity of the weight-bearing medial compartment.  Lateral: Partial thickness chondral surface irregularity of the lateral tibial plateau centrally.  MISCELLANEOUS  Joint:  Trace knee joint  effusion. Fat pads within normal limits.  Popliteal Fossa:  No Baker's cyst. Intact popliteus tendon.  Extensor Mechanism:  Intact quadriceps and patellar tendons.  Bones: No acute fracture. No dislocation. Mild reactive marrow edema underlying the medial meniscal posterior root attachment site. No marrow replacing bone lesion.  Other: No significant periarticular soft tissue findings.  IMPRESSION: 1. Medial meniscal tears, as above. 2. Posterior root attachment of the lateral meniscus is ill-defined and may be partially torn. Blunting of the free edge of the midbody. 3. Mild-to-moderate tricompartmental osteoarthritis. 4. Trace knee joint effusion.   Electronically Signed   By: Mabel Converse D.O.   On: 11/22/2022 10:29  Exam End: 11/07/22 10:33     PMFS History: Patient Active Problem List   Diagnosis Date Noted   Unilateral primary osteoarthritis, right knee 02/09/2023   Depression, major, single episode, mild (HCC) 03/19/2021   Osteoarthritis of cervical spine with myelopathy 03/19/2021   Grade II hemorrhoids 02/10/2021   Pain management contract signed 03/08/2016   Hyperlipidemia 10/29/2013   Colon adenoma 04/08/2013   Thyroid  disease    CAD (coronary artery disease)    Bilateral lower extremity edema  09/06/2011   GERD 09/18/2008   Essential hypertension 05/01/2007   Arthritis of knee 05/01/2007   COPD (chronic obstructive pulmonary disease) (HCC) 05/01/2007   Arthropathy 05/01/2007   SLEEP APNEA 05/01/2007   Past Medical History:  Diagnosis Date   Anginal pain (HCC)    Arthritis    OSTEO BACK KNEES HIPS & HANDS   Arthropathy, unspecified, site unspecified    Chronic airway obstruction, not elsewhere classified    Coronary atherosclerosis of unspecified type of vessel, native or graft    a. LHC 5/16:  mLAD 20%, CFX stent ok, OM3 stent ok, mRCA 30%, normal LVF   Headache(784.0)    Hx of echocardiogram    a. Echo 5/16:  mild LVH, EF 60%, mildly dilated  Ao root (40 mm)   Hypothyroidism    Other and unspecified hyperlipidemia    Thyroid  disease    hypothyroid   Unspecified essential hypertension    Unspecified sleep apnea    Dr. Cyrena at [facility] told pt he had sleep apnea, but never sent him for a sleep study so he doesn't wear a CPAP    Family History  Problem Relation Age of Onset   Stroke Mother    CAD Father        MI at age 64   Colon cancer Neg Hx     Past Surgical History:  Procedure Laterality Date   ANKLE SURGERY     BALLOON DILATION N/A 05/02/2013   Procedure: BALLOON DILATION;  Surgeon: Claudis RAYMOND Rivet, MD;  [facility];  Service: Endoscopy;  Laterality: N/A;   BIOPSY  07/17/2020   Procedure: BIOPSY;  Surgeon: Eartha Angelia Sieving, MD;  [facility];  Service: Gastroenterology;;   CARDIAC CATHETERIZATION  01/15/2013   CARDIAC CATHETERIZATION N/A 05/16/2014   Procedure: Left Heart Cath and Coronary Angiography;  Surgeon: Peter M Jordan, MD;  Location: [facility] INVASIVE CV LAB;  Service: Cardiovascular;  Laterality: N/A;   COLONOSCOPY N/A 05/02/2013   Procedure: COLONOSCOPY;  Surgeon: Claudis RAYMOND Rivet, MD;  [facility];  Service: Endoscopy;  Laterality: N/A;  200-moved to 1200 Ann notified pt   COLONOSCOPY WITH PROPOFOL  N/A 05/08/2020   Procedure: COLONOSCOPY WITH PROPOFOL ;  Surgeon: Eartha Angelia Sieving, MD;  [facility];  Service: Gastroenterology;  Laterality: N/A;  AM   COLONOSCOPY WITH PROPOFOL  N/A 06/19/2020   Procedure: COLONOSCOPY WITH PROPOFOL ;  Surgeon: Eartha Angelia Sieving, MD;  [facility];  Service: Gastroenterology;  Laterality: N/A;  [id number]   COLONOSCOPY WITH PROPOFOL  N/A 07/17/2020   Procedure: COLONOSCOPY WITH PROPOFOL ;  Surgeon: Eartha Angelia Sieving, MD;  [facility];  Service: Gastroenterology;  Laterality: N/A;  9:00   CORONARY STENT PLACEMENT     2003, 2004   ESOPHAGOGASTRODUODENOSCOPY N/A 05/02/2013    Procedure: ESOPHAGOGASTRODUODENOSCOPY (EGD);  Surgeon: Claudis RAYMOND Rivet, MD;  [facility];  Service: Endoscopy;  Laterality: N/A;   HEMORRHOID SURGERY  2022   HEMOSTASIS CLIP PLACEMENT  07/17/2020   Procedure: HEMOSTASIS CLIP PLACEMENT;  Surgeon: Eartha Angelia Sieving, MD;  [facility];  Service: Gastroenterology;;   HIP ARTHROPLASTY     KNEE SURGERY Left 12/03/2013   LEFT HEART CATHETERIZATION WITH CORONARY ANGIOGRAM N/A 01/15/2013   Procedure: LEFT HEART CATHETERIZATION WITH CORONARY ANGIOGRAM;  Surgeon: Victory LELON Claudene DOUGLAS, MD;  Location: [facility] CATH LAB;  Service: Cardiovascular;  Laterality: N/A;   MALONEY DILATION N/A 05/02/2013   Procedure: MALONEY DILATION;  Surgeon: Claudis  RAYMOND Rivet, MD;  [facility];  Service: Endoscopy;  Laterality: N/A;   POLYPECTOMY  07/17/2020   Procedure: POLYPECTOMY;  Surgeon: Eartha Angelia Sieving, MD;  [facility];  Service: Gastroenterology;;   HARLEY DILATION N/A 05/02/2013   Procedure: HARLEY DILATION;  Surgeon: Claudis RAYMOND Rivet, MD;  [facility];  Service: Endoscopy;  Laterality: N/A;   TONSILLECTOMY     Social History   Occupational History   Occupation: Patent Attorney: [employer]   Occupation: Retired   Tobacco Use   Smoking status: Former    Current packs/day: 0.00    Average packs/day: 3.0 packs/day for 20.0 years (60.0 ttl pk-yrs)    Types: Cigarettes    Start date: 05/26/1955    Quit date: 05/26/1975    Years since quitting: 47.7   Smokeless tobacco: Current    Types: Chew  Vaping Use   Vaping status: Never Used  Substance and Sexual Activity   Alcohol use: No   Drug use: No   Sexual activity: Not Currently

## 2023-02-09 NOTE — Telephone Encounter (Signed)
 [place] office to obtain authorization for right knee gel injection.

## 2023-02-23 ENCOUNTER — Encounter: Payer: Self-pay | Admitting: Orthopaedic Surgery

## 2023-02-23 ENCOUNTER — Ambulatory Visit (INDEPENDENT_AMBULATORY_CARE_PROVIDER_SITE_OTHER): Payer: Medicare Other | Admitting: Orthopaedic Surgery

## 2023-02-23 VITALS — Ht 72.0 in | Wt 219.0 lb

## 2023-02-23 DIAGNOSIS — M1711 Unilateral primary osteoarthritis, right knee: Secondary | ICD-10-CM

## 2023-02-23 MED ORDER — HYLAN G-F 20 16 MG/2ML IX SOSY
16.0000 mg | PREFILLED_SYRINGE | INTRA_ARTICULAR | Status: AC | PRN
  Administered 2023-02-23: 16 mg via INTRA_ARTICULAR

## 2023-02-23 MED ORDER — LIDOCAINE HCL 1 % IJ SOLN
0.5000 mL | INTRAMUSCULAR | Status: AC | PRN
  Administered 2023-02-23: .5 mL

## 2023-02-23 NOTE — Progress Notes (Signed)
 Office Visit Note   Patient: [name]           Date of Birth: [birth year]           MRN: [id number] Visit Date: 02/23/2023              Requested by: Dettinger, Elige Radon, MD [address] [place],  [place] [address] PCP: Dettinger, Elige Radon, MD   Assessment & Plan: Visit Diagnoses:  1. Unilateral primary osteoarthritis, right knee     Plan: Synvisc injection performed right knee.  Return 1 week for his second right knee injection.  Follow-Up Instructions: No follow-ups on file.   Orders:  No orders of the defined types were placed in this encounter.  No orders of the defined types were placed in this encounter.     Procedures: Large Joint Inj: R knee on 02/23/2023 9:43 AM Indications: pain and joint swelling Details: 22 G 1.5 in needle, anterolateral approach  Arthrogram: No  Medications: 0.5 mL lidocaine 1 %; 16 mg hylan 16 MG/2ML Outcome: tolerated well, no immediate complications Procedure, treatment alternatives, risks and benefits explained, specific risks discussed. Consent was given by the patient. Immediately prior to procedure a time out was called to verify the correct patient, procedure, equipment, support staff and site/side marked as required. Patient was prepped and draped in the usual sterile fashion.       Clinical Data: No additional findings.   Subjective: Chief Complaint  Patient presents with   Right Knee - Pain    Synvisc #1    HPI patient here for her Synvisc injection right knee.  Review of Systems no change   Objective: Vital Signs: Ht 6' (1.829 m)   Wt 219 lb (99.3 kg)   BMI 29.70 kg/m   Physical Exam unchanged unchanged  Ortho Exam no rash over the right knee.  Specialty Comments:  No specialty comments available.  Imaging: No results found.   PMFS History: Patient Active Problem List   Diagnosis Date Noted   Unilateral primary osteoarthritis, right knee 02/09/2023   Depression, major, single episode, mild  (HCC) 03/19/2021   Osteoarthritis of cervical spine with myelopathy 03/19/2021   Grade II hemorrhoids 02/10/2021   Pain management contract signed 03/08/2016   Hyperlipidemia 10/29/2013   Colon adenoma 04/08/2013   Thyroid disease    CAD (coronary artery disease)    Bilateral lower extremity edema 09/06/2011   GERD 09/18/2008   Essential hypertension 05/01/2007   Arthritis of knee 05/01/2007   COPD (chronic obstructive pulmonary disease) (HCC) 05/01/2007   Arthropathy 05/01/2007   SLEEP APNEA 05/01/2007   Past Medical History:  Diagnosis Date   Anginal pain (HCC)    Arthritis    OSTEO BACK KNEES HIPS & HANDS   Arthropathy, unspecified, site unspecified    Chronic airway obstruction, not elsewhere classified    Coronary atherosclerosis of unspecified type of vessel, native or graft    a. LHC 5/16:  mLAD 20%, CFX stent ok, OM3 stent ok, mRCA 30%, normal LVF   Headache(784.0)    Hx of echocardiogram    a. Echo 5/16:  mild LVH, EF 60%, mildly dilated Ao root (40 mm)   Hypothyroidism    Other and unspecified hyperlipidemia    Thyroid disease    hypothyroid   Unspecified essential hypertension    Unspecified sleep apnea    Dr. Modesto Charon at [facility] told pt he had sleep apnea, but never sent him for a sleep study so  he doesn't wear a CPAP    Family History  Problem Relation Age of Onset   Stroke Mother    CAD Father        MI at age 28   Colon cancer Neg Hx     Past Surgical History:  Procedure Laterality Date   ANKLE SURGERY     BALLOON DILATION N/A 05/02/2013   Procedure: BALLOON DILATION;  Surgeon: Malissa Hippo, MD;  [facility];  Service: Endoscopy;  Laterality: N/A;   BIOPSY  07/17/2020   Procedure: BIOPSY;  Surgeon: Dolores Frame, MD;  [facility];  Service: Gastroenterology;;   CARDIAC CATHETERIZATION  01/15/2013   CARDIAC CATHETERIZATION N/A 05/16/2014   Procedure: Left Heart Cath and Coronary Angiography;  Surgeon:  Peter M Swaziland, MD;  Location: [facility] INVASIVE CV LAB;  Service: Cardiovascular;  Laterality: N/A;   COLONOSCOPY N/A 05/02/2013   Procedure: COLONOSCOPY;  Surgeon: Malissa Hippo, MD;  [facility];  Service: Endoscopy;  Laterality: N/A;  200-moved to 1200 Ann notified pt   COLONOSCOPY WITH PROPOFOL N/A 05/08/2020   Procedure: COLONOSCOPY WITH PROPOFOL;  Surgeon: Dolores Frame, MD;  [facility];  Service: Gastroenterology;  Laterality: N/A;  AM   COLONOSCOPY WITH PROPOFOL N/A 06/19/2020   Procedure: COLONOSCOPY WITH PROPOFOL;  Surgeon: Dolores Frame, MD;  [facility];  Service: Gastroenterology;  Laterality: N/A;  [id number]   COLONOSCOPY WITH PROPOFOL N/A 07/17/2020   Procedure: COLONOSCOPY WITH PROPOFOL;  Surgeon: Dolores Frame, MD;  [facility];  Service: Gastroenterology;  Laterality: N/A;  9:00   CORONARY STENT PLACEMENT     2003, 2004   ESOPHAGOGASTRODUODENOSCOPY N/A 05/02/2013   Procedure: ESOPHAGOGASTRODUODENOSCOPY (EGD);  Surgeon: Malissa Hippo, MD;  [facility];  Service: Endoscopy;  Laterality: N/A;   HEMORRHOID SURGERY  2022   HEMOSTASIS CLIP PLACEMENT  07/17/2020   Procedure: HEMOSTASIS CLIP PLACEMENT;  Surgeon: Dolores Frame, MD;  [facility];  Service: Gastroenterology;;   HIP ARTHROPLASTY     KNEE SURGERY Left 12/03/2013   LEFT HEART CATHETERIZATION WITH CORONARY ANGIOGRAM N/A 01/15/2013   Procedure: LEFT HEART CATHETERIZATION WITH CORONARY ANGIOGRAM;  Surgeon: Lesleigh Noe, MD;  Location: [facility] CATH LAB;  Service: Cardiovascular;  Laterality: N/A;   MALONEY DILATION N/A 05/02/2013   Procedure: Elease Hashimoto DILATION;  Surgeon: Malissa Hippo, MD;  [facility];  Service: Endoscopy;  Laterality: N/A;   POLYPECTOMY  07/17/2020   Procedure: POLYPECTOMY;  Surgeon: Dolores Frame, MD;  [facility];  Service: Gastroenterology;;    Gaspar Bidding DILATION N/A 05/02/2013   Procedure: Gaspar Bidding DILATION;  Surgeon: Malissa Hippo, MD;  [facility];  Service: Endoscopy;  Laterality: N/A;   TONSILLECTOMY     Social History   Occupational History   Occupation: Patent attorney: Barrister's clerk   Occupation: Retired   Tobacco Use   Smoking status: Former    Current packs/day: 0.00    Average packs/day: 3.0 packs/day for 20.0 years (60.0 ttl pk-yrs)    Types: Cigarettes    Start date: 05/26/1955    Quit date: 05/26/1975    Years since quitting: 47.7   Smokeless tobacco: Current    Types: Chew  Vaping Use   Vaping status: Never Used  Substance and Sexual Activity   Alcohol use: No   Drug use: No   Sexual activity: Not Currently

## 2023-03-01 ENCOUNTER — Ambulatory Visit (INDEPENDENT_AMBULATORY_CARE_PROVIDER_SITE_OTHER): Payer: Medicare Other | Admitting: Orthopaedic Surgery

## 2023-03-01 ENCOUNTER — Encounter: Payer: Self-pay | Admitting: Orthopaedic Surgery

## 2023-03-01 VITALS — Ht 72.0 in | Wt 219.0 lb

## 2023-03-01 DIAGNOSIS — M1711 Unilateral primary osteoarthritis, right knee: Secondary | ICD-10-CM

## 2023-03-01 MED ORDER — LIDOCAINE HCL 1 % IJ SOLN
0.5000 mL | INTRAMUSCULAR | Status: AC | PRN
Start: 2023-03-01 — End: 2023-03-01
  Administered 2023-03-01: .5 mL

## 2023-03-01 MED ORDER — HYLAN G-F 20 16 MG/2ML IX SOSY
16.0000 mg | PREFILLED_SYRINGE | INTRA_ARTICULAR | Status: AC | PRN
  Administered 2023-03-01: 16 mg via INTRA_ARTICULAR

## 2023-03-01 NOTE — Progress Notes (Signed)
 Office Visit Note   Patient: [name]           Date of Birth: [birth year]           MRN: [id number] Visit Date: 03/01/2023              Requested by: Dettinger, Elige Radon, MD [address] [place],  [place] [address] PCP: Dettinger, Elige Radon, MD   Assessment & Plan: Visit Diagnoses:  1. Unilateral primary osteoarthritis, right knee     Plan: #2 right knee Synvisc injection performed return in 1 week for final injection.  Follow-Up Instructions: No follow-ups on file.   Orders:  No orders of the defined types were placed in this encounter.  No orders of the defined types were placed in this encounter.     Procedures: Large Joint Inj: R knee on 03/01/2023 9:11 AM Indications: pain and joint swelling Details: 22 G 1.5 in needle, anterolateral approach  Arthrogram: No  Medications: 0.5 mL lidocaine 1 %; 16 mg hylan 16 MG/2ML Outcome: tolerated well, no immediate complications Procedure, treatment alternatives, risks and benefits explained, specific risks discussed. Consent was given by the patient. Immediately prior to procedure a time out was called to verify the correct patient, procedure, equipment, support staff and site/side marked as required. Patient was prepped and draped in the usual sterile fashion.       Clinical Data: No additional findings.   Subjective: Chief Complaint  Patient presents with   Right Knee - Pain    Synvisc injection #2    HPI patient here for Synvisc injection right knee #2.  He is still working plays golf when he can still notes discomfort and swelling.  Review of Systems updated unchanged   Objective: Vital Signs: Ht 6' (1.829 m)   Wt 219 lb (99.3 kg)   BMI 29.70 kg/m   Physical Exam unchanged  Ortho Exam no reaction from first Synvisc injection right knee.  Specialty Comments:  No specialty comments available.  Imaging: No results found.   PMFS History: Patient Active Problem List   Diagnosis Date Noted    Unilateral primary osteoarthritis, right knee 02/09/2023   Depression, major, single episode, mild (HCC) 03/19/2021   Osteoarthritis of cervical spine with myelopathy 03/19/2021   Grade II hemorrhoids 02/10/2021   Pain management contract signed 03/08/2016   Hyperlipidemia 10/29/2013   Colon adenoma 04/08/2013   Thyroid disease    CAD (coronary artery disease)    Bilateral lower extremity edema 09/06/2011   GERD 09/18/2008   Essential hypertension 05/01/2007   Arthritis of knee 05/01/2007   COPD (chronic obstructive pulmonary disease) (HCC) 05/01/2007   Arthropathy 05/01/2007   SLEEP APNEA 05/01/2007   Past Medical History:  Diagnosis Date   Anginal pain (HCC)    Arthritis    OSTEO BACK KNEES HIPS & HANDS   Arthropathy, unspecified, site unspecified    Chronic airway obstruction, not elsewhere classified    Coronary atherosclerosis of unspecified type of vessel, native or graft    a. LHC 5/16:  mLAD 20%, CFX stent ok, OM3 stent ok, mRCA 30%, normal LVF   Headache(784.0)    Hx of echocardiogram    a. Echo 5/16:  mild LVH, EF 60%, mildly dilated Ao root (40 mm)   Hypothyroidism    Other and unspecified hyperlipidemia    Thyroid disease    hypothyroid   Unspecified essential hypertension    Unspecified sleep apnea    Dr. Modesto Charon at [facility]  told pt he had sleep apnea, but never sent him for a sleep study so he doesn't wear a CPAP    Family History  Problem Relation Age of Onset   Stroke Mother    CAD Father        MI at age 30   Colon cancer Neg Hx     Past Surgical History:  Procedure Laterality Date   ANKLE SURGERY     BALLOON DILATION N/A 05/02/2013   Procedure: BALLOON DILATION;  Surgeon: Malissa Hippo, MD;  [facility];  Service: Endoscopy;  Laterality: N/A;   BIOPSY  07/17/2020   Procedure: BIOPSY;  Surgeon: Dolores Frame, MD;  [facility];  Service: Gastroenterology;;   CARDIAC CATHETERIZATION  01/15/2013   CARDIAC  CATHETERIZATION N/A 05/16/2014   Procedure: Left Heart Cath and Coronary Angiography;  Surgeon: Peter M Swaziland, MD;  Location: [facility] INVASIVE CV LAB;  Service: Cardiovascular;  Laterality: N/A;   COLONOSCOPY N/A 05/02/2013   Procedure: COLONOSCOPY;  Surgeon: Malissa Hippo, MD;  [facility];  Service: Endoscopy;  Laterality: N/A;  200-moved to 1200 Ann notified pt   COLONOSCOPY WITH PROPOFOL N/A 05/08/2020   Procedure: COLONOSCOPY WITH PROPOFOL;  Surgeon: Dolores Frame, MD;  [facility];  Service: Gastroenterology;  Laterality: N/A;  AM   COLONOSCOPY WITH PROPOFOL N/A 06/19/2020   Procedure: COLONOSCOPY WITH PROPOFOL;  Surgeon: Dolores Frame, MD;  [facility];  Service: Gastroenterology;  Laterality: N/A;  [id number]   COLONOSCOPY WITH PROPOFOL N/A 07/17/2020   Procedure: COLONOSCOPY WITH PROPOFOL;  Surgeon: Dolores Frame, MD;  [facility];  Service: Gastroenterology;  Laterality: N/A;  9:00   CORONARY STENT PLACEMENT     2003, 2004   ESOPHAGOGASTRODUODENOSCOPY N/A 05/02/2013   Procedure: ESOPHAGOGASTRODUODENOSCOPY (EGD);  Surgeon: Malissa Hippo, MD;  [facility];  Service: Endoscopy;  Laterality: N/A;   HEMORRHOID SURGERY  2022   HEMOSTASIS CLIP PLACEMENT  07/17/2020   Procedure: HEMOSTASIS CLIP PLACEMENT;  Surgeon: Dolores Frame, MD;  [facility];  Service: Gastroenterology;;   HIP ARTHROPLASTY     KNEE SURGERY Left 12/03/2013   LEFT HEART CATHETERIZATION WITH CORONARY ANGIOGRAM N/A 01/15/2013   Procedure: LEFT HEART CATHETERIZATION WITH CORONARY ANGIOGRAM;  Surgeon: Lesleigh Noe, MD;  Location: [facility] CATH LAB;  Service: Cardiovascular;  Laterality: N/A;   MALONEY DILATION N/A 05/02/2013   Procedure: Elease Hashimoto DILATION;  Surgeon: Malissa Hippo, MD;  [facility];  Service: Endoscopy;  Laterality: N/A;   POLYPECTOMY  07/17/2020   Procedure: POLYPECTOMY;   Surgeon: Dolores Frame, MD;  [facility];  Service: Gastroenterology;;   Gaspar Bidding DILATION N/A 05/02/2013   Procedure: Gaspar Bidding DILATION;  Surgeon: Malissa Hippo, MD;  [facility];  Service: Endoscopy;  Laterality: N/A;   TONSILLECTOMY     Social History   Occupational History   Occupation: Patent attorney: Barrister's clerk   Occupation: Retired   Tobacco Use   Smoking status: Former    Current packs/day: 0.00    Average packs/day: 3.0 packs/day for 20.0 years (60.0 ttl pk-yrs)    Types: Cigarettes    Start date: 05/26/1955    Quit date: 05/26/1975    Years since quitting: 47.7   Smokeless tobacco: Current    Types: Chew  Vaping Use   Vaping status: Never Used  Substance and Sexual Activity   Alcohol  use: No   Drug use: No   Sexual activity: Not Currently

## 2023-03-08 ENCOUNTER — Ambulatory Visit: Payer: Medicare Other | Admitting: Orthopaedic Surgery

## 2023-03-08 DIAGNOSIS — M1711 Unilateral primary osteoarthritis, right knee: Secondary | ICD-10-CM | POA: Diagnosis not present

## 2023-03-08 MED ORDER — HYLAN G-F 20 16 MG/2ML IX SOSY
16.0000 mg | PREFILLED_SYRINGE | INTRA_ARTICULAR | Status: AC | PRN
  Administered 2023-03-08: 16 mg via INTRA_ARTICULAR

## 2023-03-08 MED ORDER — LIDOCAINE HCL 1 % IJ SOLN
0.5000 mL | INTRAMUSCULAR | Status: AC | PRN
  Administered 2023-03-08: .5 mL

## 2023-03-08 NOTE — Addendum Note (Signed)
 Addended by: Michaele Offer on: 03/08/2023 10:41 AM   Modules accepted: Orders

## 2023-03-08 NOTE — Progress Notes (Signed)
 Office Visit Note   Patient: [name]           Date of Birth: 1945-11-24           MRN: [id number] Visit Date: 03/08/2023              Requested by: Dettinger, Elige Radon, MD [address] [place],  [place] [address] PCP: Dettinger, Elige Radon, MD   Assessment & Plan: Visit Diagnoses:  1. Unilateral primary osteoarthritis, right knee     Plan: Third Synvisc injection performed and is having ongoing problems we will see Dr. Dorene Grebe.  Follow-Up Instructions: No follow-ups on file.   Orders:  No orders of the defined types were placed in this encounter.  No orders of the defined types were placed in this encounter.     Procedures: Large Joint Inj: R knee on 03/08/2023 9:58 AM Indications: pain and joint swelling Details: 22 G 1.5 in needle, anterolateral approach  Arthrogram: No  Medications: 0.5 mL lidocaine 1 %; 16 mg hylan 16 MG/2ML Outcome: tolerated well, no immediate complications Procedure, treatment alternatives, risks and benefits explained, specific risks discussed. Consent was given by the patient. Immediately prior to procedure a time out was called to verify the correct patient, procedure, equipment, support staff and site/side marked as required. Patient was prepped and draped in the usual sterile fashion.       Clinical Data: No additional findings.   Subjective: No chief complaint on file.   HPI follow-up for third Synvisc injection right knee he states he has gotten a little bit better.  Still having pain and is concerned he may require total knee arthroplasty.  Review of Systems   Objective: Vital Signs: There were no vitals taken for this visit.  Physical Exam unchanged  Ortho Exam unchanged  Specialty Comments:  No specialty comments available.  Imaging: No results found.   PMFS History: Patient Active Problem List   Diagnosis Date Noted   Unilateral primary osteoarthritis, right knee 02/09/2023   Depression, major, single  episode, mild (HCC) 03/19/2021   Osteoarthritis of cervical spine with myelopathy 03/19/2021   Grade II hemorrhoids 02/10/2021   Pain management contract signed 03/08/2016   Hyperlipidemia 10/29/2013   Colon adenoma 04/08/2013   Thyroid disease    CAD (coronary artery disease)    Bilateral lower extremity edema 09/06/2011   GERD 09/18/2008   Essential hypertension 05/01/2007   Arthritis of knee 05/01/2007   COPD (chronic obstructive pulmonary disease) (HCC) 05/01/2007   Arthropathy 05/01/2007   SLEEP APNEA 05/01/2007   Past Medical History:  Diagnosis Date   Anginal pain (HCC)    Arthritis    OSTEO BACK KNEES HIPS & HANDS   Arthropathy, unspecified, site unspecified    Chronic airway obstruction, not elsewhere classified    Coronary atherosclerosis of unspecified type of vessel, native or graft    a. LHC 5/16:  mLAD 20%, CFX stent ok, OM3 stent ok, mRCA 30%, normal LVF   Headache(784.0)    Hx of echocardiogram    a. Echo 5/16:  mild LVH, EF 60%, mildly dilated Ao root (40 mm)   Hypothyroidism    Other and unspecified hyperlipidemia    Thyroid disease    hypothyroid   Unspecified essential hypertension    Unspecified sleep apnea    Dr. Modesto Charon at [facility] told pt he had sleep apnea, but never sent him for a sleep study so he doesn't wear a CPAP    Family  History  Problem Relation Age of Onset   Stroke Mother    CAD Father        MI at age 32   Colon cancer Neg Hx     Past Surgical History:  Procedure Laterality Date   ANKLE SURGERY     BALLOON DILATION N/A 05/02/2013   Procedure: BALLOON DILATION;  Surgeon: Malissa Hippo, MD;  [facility];  Service: Endoscopy;  Laterality: N/A;   BIOPSY  07/17/2020   Procedure: BIOPSY;  Surgeon: Dolores Frame, MD;  [facility];  Service: Gastroenterology;;   CARDIAC CATHETERIZATION  01/15/2013   CARDIAC CATHETERIZATION N/A 05/16/2014   Procedure: Left Heart Cath and Coronary Angiography;   Surgeon: Peter M Swaziland, MD;  Location: [facility] INVASIVE CV LAB;  Service: Cardiovascular;  Laterality: N/A;   COLONOSCOPY N/A 05/02/2013   Procedure: COLONOSCOPY;  Surgeon: Malissa Hippo, MD;  [facility];  Service: Endoscopy;  Laterality: N/A;  200-moved to 1200 Ann notified pt   COLONOSCOPY WITH PROPOFOL N/A 05/08/2020   Procedure: COLONOSCOPY WITH PROPOFOL;  Surgeon: Dolores Frame, MD;  [facility];  Service: Gastroenterology;  Laterality: N/A;  AM   COLONOSCOPY WITH PROPOFOL N/A 06/19/2020   Procedure: COLONOSCOPY WITH PROPOFOL;  Surgeon: Dolores Frame, MD;  [facility];  Service: Gastroenterology;  Laterality: N/A;  [id number]   COLONOSCOPY WITH PROPOFOL N/A 07/17/2020   Procedure: COLONOSCOPY WITH PROPOFOL;  Surgeon: Dolores Frame, MD;  [facility];  Service: Gastroenterology;  Laterality: N/A;  9:00   CORONARY STENT PLACEMENT     2003, 2004   ESOPHAGOGASTRODUODENOSCOPY N/A 05/02/2013   Procedure: ESOPHAGOGASTRODUODENOSCOPY (EGD);  Surgeon: Malissa Hippo, MD;  [facility];  Service: Endoscopy;  Laterality: N/A;   HEMORRHOID SURGERY  2022   HEMOSTASIS CLIP PLACEMENT  07/17/2020   Procedure: HEMOSTASIS CLIP PLACEMENT;  Surgeon: Dolores Frame, MD;  [facility];  Service: Gastroenterology;;   HIP ARTHROPLASTY     KNEE SURGERY Left 12/03/2013   LEFT HEART CATHETERIZATION WITH CORONARY ANGIOGRAM N/A 01/15/2013   Procedure: LEFT HEART CATHETERIZATION WITH CORONARY ANGIOGRAM;  Surgeon: Lesleigh Noe, MD;  Location: [facility] CATH LAB;  Service: Cardiovascular;  Laterality: N/A;   MALONEY DILATION N/A 05/02/2013   Procedure: Elease Hashimoto DILATION;  Surgeon: Malissa Hippo, MD;  [facility];  Service: Endoscopy;  Laterality: N/A;   POLYPECTOMY  07/17/2020   Procedure: POLYPECTOMY;  Surgeon: Dolores Frame, MD;  [facility];  Service: Gastroenterology;;    Gaspar Bidding DILATION N/A 05/02/2013   Procedure: Gaspar Bidding DILATION;  Surgeon: Malissa Hippo, MD;  [facility];  Service: Endoscopy;  Laterality: N/A;   TONSILLECTOMY     Social History   Occupational History   Occupation: Patent attorney: Barrister's clerk   Occupation: Retired   Tobacco Use   Smoking status: Former    Current packs/day: 0.00    Average packs/day: 3.0 packs/day for 20.0 years (60.0 ttl pk-yrs)    Types: Cigarettes    Start date: 05/26/1955    Quit date: 05/26/1975    Years since quitting: 47.8   Smokeless tobacco: Current    Types: Chew  Vaping Use   Vaping status: Never Used  Substance and Sexual Activity   Alcohol use: No   Drug use: No   Sexual activity: Not Currently

## 2023-03-14 ENCOUNTER — Other Ambulatory Visit: Payer: Self-pay | Admitting: Family Medicine

## 2023-03-14 DIAGNOSIS — F320 Major depressive disorder, single episode, mild: Secondary | ICD-10-CM

## 2023-03-14 DIAGNOSIS — E782 Mixed hyperlipidemia: Secondary | ICD-10-CM

## 2023-03-17 ENCOUNTER — Telehealth: Payer: Self-pay

## 2023-03-17 ENCOUNTER — Other Ambulatory Visit: Payer: Self-pay | Admitting: Family Medicine

## 2023-03-17 DIAGNOSIS — Z0289 Encounter for other administrative examinations: Secondary | ICD-10-CM

## 2023-03-17 DIAGNOSIS — G8929 Other chronic pain: Secondary | ICD-10-CM

## 2023-03-17 DIAGNOSIS — M4712 Other spondylosis with myelopathy, cervical region: Secondary | ICD-10-CM

## 2023-03-17 NOTE — Telephone Encounter (Signed)
 Informed pt that this medication cannot be filled outside of an OV. Pt states that he will be out of town until 3/20. Appt made with Dettinger 3/21 at 3:55 to discuss pain med only. Pt aware to keep regular check up.

## 2023-03-17 NOTE — Telephone Encounter (Signed)
 Narcotic-can't sign-please sign to deny refill.

## 2023-03-17 NOTE — Telephone Encounter (Signed)
 Copied from CRM [id number]. Topic: Clinical - Medication Question >> Mar 17, 2023  9:14 AM Antwanette L wrote: Reason for CRM: Patient had to reschedule his appointment with Dr. Louanne Skye to 4/2 because he is currently in [place] til  3/19. Patient is currently taking oxyCODONE-acetaminophen (PERCOCET) 7.5-325 MG tablet and will run before 3/19. Patient wants to know if Dr. Louanne Skye can refill the medicine before his appoinment or write a script for a week supply. The patient can be contacted by phone at [phone].  Preferred Pharmacy [facility] [place], [place] - [address] [place], [place] [address] Phone: [phone]  Fax: [phone]

## 2023-03-22 ENCOUNTER — Ambulatory Visit: Payer: Medicare Other | Admitting: Family Medicine

## 2023-03-24 ENCOUNTER — Ambulatory Visit: Payer: Self-pay | Admitting: Family Medicine

## 2023-03-24 ENCOUNTER — Ambulatory Visit (INDEPENDENT_AMBULATORY_CARE_PROVIDER_SITE_OTHER): Admitting: Family Medicine

## 2023-03-24 ENCOUNTER — Encounter: Payer: Self-pay | Admitting: Family Medicine

## 2023-03-24 VITALS — BP 123/65 | HR 67 | Ht 72.0 in | Wt 220.0 lb

## 2023-03-24 DIAGNOSIS — E079 Disorder of thyroid, unspecified: Secondary | ICD-10-CM

## 2023-03-24 DIAGNOSIS — Z0289 Encounter for other administrative examinations: Secondary | ICD-10-CM

## 2023-03-24 DIAGNOSIS — G8929 Other chronic pain: Secondary | ICD-10-CM

## 2023-03-24 DIAGNOSIS — M4712 Other spondylosis with myelopathy, cervical region: Secondary | ICD-10-CM

## 2023-03-24 DIAGNOSIS — E782 Mixed hyperlipidemia: Secondary | ICD-10-CM | POA: Diagnosis not present

## 2023-03-24 DIAGNOSIS — I10 Essential (primary) hypertension: Secondary | ICD-10-CM

## 2023-03-24 MED ORDER — OXYCODONE-ACETAMINOPHEN 7.5-325 MG PO TABS: 1.0000 | ORAL_TABLET | Freq: Four times a day (QID) | ORAL | 0 refills | Status: DC | PRN

## 2023-03-24 MED ORDER — OXYCODONE-ACETAMINOPHEN 7.5-325 MG PO TABS
1.0000 | ORAL_TABLET | Freq: Four times a day (QID) | ORAL | 0 refills | Status: DC | PRN
Start: 2023-03-24 — End: 2023-07-04

## 2023-03-24 NOTE — Progress Notes (Signed)
 BP 123/65   Pulse 67   Ht 6' (1.829 m)   Wt 220 lb (99.8 kg)   SpO2 95%   BMI 29.84 kg/m    Subjective:   Patient ID: [name], male    DOB: [birth year], 78 y.o.   MRN: [id number]  HPI: [name] is a 78 y.o. male presenting on 03/24/2023 for Medical Management of Chronic Issues and Knee Pain   HPI Pain assessment: Cause of pain-degenerative disc disease lumbar and osteoarthritis of his legs and osteoarthritis of cervical spine Pain location-neck and back and knees Pain on scale of 1-10- 5 Frequency-daily What increases pain-certain movements or being on his feet What makes pain Better-medication and rest Effects on ADL -minimal Any change in general medical condition-none  Current opioids rx-oxycodone 7.5-3 25 4  times daily as needed # meds rx-120 Effectiveness of current meds-works well Adverse reactions from pain meds-none Morphine equivalent-45  Pill count performed-No Last drug screen -06/29/2022 ( high risk q31m, moderate risk q36m, low risk yearly ) Urine drug screen today- No Was the NCCSR reviewed-yes    03/27/2019    8:56 AM  Opioid Risk   Alcohol 0  Illegal Drugs 0  Rx Drugs 0  Alcohol 0  Illegal Drugs 0  Rx Drugs 0  Age between 16-45 years  0  History of Preadolescent Sexual Abuse 0  Psychological Disease 0  Depression 1  Opioid Risk Tool Scoring 1  Opioid Risk Interpretation Low Risk  Pain contract signed on: 06/09/2022  Hypertension Patient is currently on amlodipine and lisinopril, and their blood pressure today is 123/65. Patient denies any lightheadedness or dizziness. Patient denies headaches, blurred vision, chest pains, shortness of breath, or weakness. Denies any side effects from medication and is content with current medication.   Hypothyroidism recheck Patient is coming in for thyroid recheck today as well. They deny any issues with hair changes or heat or cold problems or diarrhea or constipation. They deny any chest pain  or palpitations. They are currently on levothyroxine 200 micrograms   Relevant past medical, surgical, family and social history reviewed and updated as indicated. Interim medical history since our last visit reviewed. Allergies and medications reviewed and updated.  Review of Systems  Constitutional:  Negative for chills and fever.  Eyes:  Negative for visual disturbance.  Respiratory:  Negative for shortness of breath and wheezing.   Cardiovascular:  Negative for chest pain and leg swelling.  Musculoskeletal:  Positive for arthralgias, back pain and neck pain. Negative for gait problem.  Skin:  Negative for rash.  All other systems reviewed and are negative.   Per HPI unless specifically indicated above   Allergies as of 03/24/2023   No Known Allergies      Medication List        Accurate as of March 24, 2023 10:53 AM. If you have any questions, ask your nurse or doctor.          albuterol 108 (90 Base) MCG/ACT inhaler Commonly known as: ProAir HFA Inhale 2 puffs into the lungs every 6 (six) hours as needed for wheezing or shortness of breath.   amLODipine 10 MG tablet Commonly known as: NORVASC Take 1 tablet (10 mg total) by mouth daily.   aspirin EC 81 MG tablet Take 1 tablet (81 mg total) by mouth daily.   levothyroxine 200 MCG tablet Commonly known as: SYNTHROID TAKE ONE TABLET ONCE DAILY   lisinopril 20 MG tablet Commonly known as: ZESTRIL Take  1 tablet (20 mg total) by mouth daily.   multivitamin with minerals Tabs tablet Take 1 tablet by mouth daily.   naloxegol oxalate 12.5 MG Tabs tablet Commonly known as: Movantik Take 1 tablet (12.5 mg total) by mouth daily.   nitroGLYCERIN 0.4 MG SL tablet Commonly known as: NITROSTAT Place 1 tablet (0.4 mg total) under the tongue every 5 (five) minutes as needed for chest pain.   oxyCODONE-acetaminophen 7.5-325 MG tablet Commonly known as: PERCOCET Take 1 tablet by mouth every 6 (six) hours as needed for  severe pain (pain score 7-10). What changed: Another medication with the same name was changed. Make sure you understand how and when to take each. Changed by: Elige Radon Analyssa Downs   oxyCODONE-acetaminophen 7.5-325 MG tablet Commonly known as: PERCOCET Take 1 tablet by mouth every 6 (six) hours as needed for severe pain (pain score 7-10). Start taking on: April 23, 2023 What changed: These instructions start on April 23, 2023. If you are unsure what to do until then, ask your doctor or other care provider. Changed by: Elige Radon Artemisa Sladek   oxyCODONE-acetaminophen 7.5-325 MG tablet Commonly known as: PERCOCET Take 1 tablet by mouth every 6 (six) hours as needed for severe pain (pain score 7-10). Start taking on: May 22, 2023 What changed: These instructions start on May 22, 2023. If you are unsure what to do until then, ask your doctor or other care provider. Changed by: Elige Radon Emma-Lee Oddo   sertraline 50 MG tablet Commonly known as: ZOLOFT TAKE ONE TABLET DAILY   sildenafil 20 MG tablet Commonly known as: REVATIO TAKE UP TO 3 TABLETS AS DIRECTED WHEN NEEDED   simvastatin 40 MG tablet Commonly known as: ZOCOR TAKE 1 TABLET AT BEDTIME   VITAMIN B-12 PO Take 2,500 mcg by mouth daily.         Objective:   BP 123/65   Pulse 67   Ht 6' (1.829 m)   Wt 220 lb (99.8 kg)   SpO2 95%   BMI 29.84 kg/m   Wt Readings from Last 3 Encounters:  03/24/23 220 lb (99.8 kg)  03/01/23 219 lb (99.3 kg)  02/23/23 219 lb (99.3 kg)    Physical Exam Vitals and nursing note reviewed.  Constitutional:      General: He is not in acute distress.    Appearance: He is well-developed. He is not diaphoretic.  Eyes:     General: No scleral icterus.    Conjunctiva/sclera: Conjunctivae normal.  Neck:     Thyroid: No thyromegaly.  Cardiovascular:     Rate and Rhythm: Normal rate and regular rhythm.     Heart sounds: Normal heart sounds. No murmur heard. Pulmonary:     Effort: Pulmonary effort  is normal. No respiratory distress.     Breath sounds: Normal breath sounds. No wheezing.  Musculoskeletal:        General: No swelling. Normal range of motion.     Cervical back: Neck supple.  Lymphadenopathy:     Cervical: No cervical adenopathy.  Skin:    General: Skin is warm and dry.     Findings: No rash.  Neurological:     Mental Status: He is alert and oriented to person, place, and time.     Coordination: Coordination normal.  Psychiatric:        Behavior: Behavior normal.       Assessment & Plan:   Problem List Items Addressed This Visit       Cardiovascular and Mediastinum  Essential hypertension - Primary     Endocrine   Thyroid disease     Nervous and Auditory   Osteoarthritis of cervical spine with myelopathy   Relevant Medications   oxyCODONE-acetaminophen (PERCOCET) 7.5-325 MG tablet (Start on 04/23/2023)   oxyCODONE-acetaminophen (PERCOCET) 7.5-325 MG tablet (Start on 05/22/2023)   oxyCODONE-acetaminophen (PERCOCET) 7.5-325 MG tablet     Other   Hyperlipidemia   Pain management contract signed   Relevant Medications   oxyCODONE-acetaminophen (PERCOCET) 7.5-325 MG tablet (Start on 04/23/2023)   oxyCODONE-acetaminophen (PERCOCET) 7.5-325 MG tablet (Start on 05/22/2023)   oxyCODONE-acetaminophen (PERCOCET) 7.5-325 MG tablet   Other Visit Diagnoses       Encounter for chronic pain management       Relevant Medications   oxyCODONE-acetaminophen (PERCOCET) 7.5-325 MG tablet (Start on 04/23/2023)   oxyCODONE-acetaminophen (PERCOCET) 7.5-325 MG tablet (Start on 05/22/2023)   oxyCODONE-acetaminophen (PERCOCET) 7.5-325 MG tablet       Continue current medicine, seems to be doing well, sent refills for him.  No changes Follow up plan: Return in about 3 months (around 06/24/2023), or if symptoms worsen or fail to improve, for Hypertension and cholesterol and pain.  Counseling provided for all of the vaccine components No orders of the defined types were  placed in this encounter.   Arville Care, MD [facility] 03/24/2023, 10:53 AM

## 2023-04-05 ENCOUNTER — Ambulatory Visit: Admitting: Family Medicine

## 2023-05-23 ENCOUNTER — Ambulatory Visit (INDEPENDENT_AMBULATORY_CARE_PROVIDER_SITE_OTHER): Payer: Medicare Other

## 2023-05-23 VITALS — BP 123/65 | HR 67 | Ht 73.0 in | Wt 220.0 lb

## 2023-05-23 DIAGNOSIS — Z0000 Encounter for general adult medical examination without abnormal findings: Secondary | ICD-10-CM

## 2023-05-23 NOTE — Patient Instructions (Signed)
 [name] , Thank you for taking time out of your busy schedule to complete your Annual Wellness Visit with me. I enjoyed our conversation and look forward to speaking with you again next year. I, as well as your care team,  appreciate your ongoing commitment to your health goals. Please review the following plan we discussed and let me know if I can assist you in the future. Your Game plan/ To Do List   Follow up Visits: Next Medicare AWV with our clinical staff: Thursday, 05/23/24 at 3:10p.m.  Next Office Visit with your provider: 07/06/23 at 10:10a.m.  Clinician Recommendations:  Aim for 30 minutes of exercise or brisk walking, 6-8 glasses of water , and 5 servings of fruits and vegetables each day. N/a      This is a list of the screening recommended for you and due dates:  Health Maintenance  Topic Date Due   Zoster (Shingles) Vaccine (1 of 2) Never done   DTaP/Tdap/Td vaccine (2 - Td or Tdap) 04/05/2023   Colon Cancer Screening  07/18/2023   Flu Shot  08/04/2023   Medicare Annual Wellness Visit  05/22/2024   Pneumonia Vaccine  Completed   Hepatitis C Screening  Completed   HPV Vaccine  Aged Out   Meningitis B Vaccine  Aged Out   COVID-19 Vaccine  Discontinued    Advanced directives: (Declined) Advance directive discussed with you today. Even though you declined this today, please call our office should you change your mind, and we can give you the proper paperwork for you to fill out. Advance Care Planning is important because it:  [x]  Makes sure you receive the medical care that is consistent with your values, goals, and preferences  [x]  It provides guidance to your family and loved ones and reduces their decisional burden about whether or not they are making the right decisions based on your wishes.  Follow the link provided in your after visit summary or read over the paperwork we have mailed to you to help you started getting your Advance Directives in place. If you need  assistance in completing these, please reach out to us  so that we can help you!  See attachments for Preventive Care and Fall Prevention Tips.

## 2023-05-23 NOTE — Progress Notes (Signed)
 Subjective:   [name] is a 78 y.o. who presents for a Medicare Wellness preventive visit.  As a reminder, Annual Wellness Visits don't include a physical exam, and some assessments may be limited, especially if this visit is performed virtually. We may recommend an in-person follow-up visit with your provider if needed.  Visit Complete: Virtual I connected with  [name] on 05/23/23 by a audio enabled telemedicine application and verified that I am speaking with the correct person using two identifiers.  Patient Location: Home  Provider Location: Home Office  I discussed the limitations of evaluation and management by telemedicine. The patient expressed understanding and agreed to proceed.  Vital Signs: Because this visit was a virtual/telehealth visit, some criteria may be missing or patient reported. Any vitals not documented were not able to be obtained and vitals that have been documented are patient reported.  VideoDeclined- This patient declined Librarian, academic. Therefore the visit was completed with audio only.  Persons Participating in Visit: Patient.  AWV Questionnaire: No: Patient Medicare AWV questionnaire was not completed prior to this visit.  Cardiac Risk Factors include: advanced age (>62men, >71 women);male gender;dyslipidemia;hypertension;Other (see comment), Risk factor comments: CAD     Objective:     Today's Vitals   05/23/23 1415  BP: 123/65  Pulse: 67  Weight: 220 lb (99.8 kg)  Height: 6\' 1"  (1.854 m)   Body mass index is 29.03 kg/m.     05/23/2023    2:21 PM 05/05/2022    3:24 PM 04/14/2021    8:30 AM 07/17/2020    7:41 AM 06/19/2020    7:39 AM 06/17/2020    1:18 PM 04/13/2020    8:17 AM  Advanced Directives  Does Patient Have a Medical Advance Directive? No No No No No No No  Would patient like information on creating a medical advance directive?  No - Patient declined No - Patient declined No -  Patient declined No - Patient declined No - Patient declined Yes (MAU/Ambulatory/Procedural Areas - Information given)    Current Medications (verified) Outpatient Encounter Medications as of 05/23/2023  Medication Sig   albuterol  (PROAIR  HFA) 108 (90 BASE) MCG/ACT inhaler Inhale 2 puffs into the lungs every 6 (six) hours as needed for wheezing or shortness of breath.   amLODipine  (NORVASC ) 10 MG tablet Take 1 tablet (10 mg total) by mouth daily.   aspirin  EC 81 MG tablet Take 1 tablet (81 mg total) by mouth daily.   Cyanocobalamin (VITAMIN B-12 PO) Take 2,500 mcg by mouth daily.   levothyroxine  (SYNTHROID ) 200 MCG tablet TAKE ONE TABLET ONCE DAILY   lisinopril  (ZESTRIL ) 20 MG tablet Take 1 tablet (20 mg total) by mouth daily.   Multiple Vitamin (MULTIVITAMIN WITH MINERALS) TABS tablet Take 1 tablet by mouth daily.   naloxegol  oxalate (MOVANTIK ) 12.5 MG TABS tablet Take 1 tablet (12.5 mg total) by mouth daily.   nitroGLYCERIN  (NITROSTAT ) 0.4 MG SL tablet Place 1 tablet (0.4 mg total) under the tongue every 5 (five) minutes as needed for chest pain.   oxyCODONE -acetaminophen  (PERCOCET) 7.5-325 MG tablet Take 1 tablet by mouth every 6 (six) hours as needed for severe pain (pain score 7-10).   oxyCODONE -acetaminophen  (PERCOCET) 7.5-325 MG tablet Take 1 tablet by mouth every 6 (six) hours as needed for severe pain (pain score 7-10).   oxyCODONE -acetaminophen  (PERCOCET) 7.5-325 MG tablet Take 1 tablet by mouth every 6 (six) hours as needed for severe pain (pain score 7-10).  sertraline  (ZOLOFT ) 50 MG tablet TAKE ONE TABLET DAILY   sildenafil  (REVATIO ) 20 MG tablet TAKE UP TO 3 TABLETS AS DIRECTED WHEN NEEDED   simvastatin  (ZOCOR ) 40 MG tablet TAKE 1 TABLET AT BEDTIME   No facility-administered encounter medications on file as of 05/23/2023.    Allergies (verified) Patient has no known allergies.   History: Past Medical History:  Diagnosis Date   Anginal pain (HCC)    Arthritis    OSTEO  BACK KNEES HIPS & HANDS   Arthropathy, unspecified, site unspecified    Chronic airway obstruction, not elsewhere classified    Coronary atherosclerosis of unspecified type of vessel, native or graft    a. LHC 5/16:  mLAD 20%, CFX stent ok, OM3 stent ok, mRCA 30%, normal LVF   Headache(784.0)    Hx of echocardiogram    a. Echo 5/16:  mild LVH, EF 60%, mildly dilated Ao root (40 mm)   Hypothyroidism    Other and unspecified hyperlipidemia    Thyroid  disease    hypothyroid   Unspecified essential hypertension    Unspecified sleep apnea    Dr. Margery Sheets at [facility] told pt he had sleep apnea, but never sent him for a sleep study so he doesn't wear a CPAP   Past Surgical History:  Procedure Laterality Date   ANKLE SURGERY     BALLOON DILATION N/A 05/02/2013   Procedure: BALLOON DILATION;  Surgeon: Ruby Corporal, MD;  [facility];  Service: Endoscopy;  Laterality: N/A;   BIOPSY  07/17/2020   Procedure: BIOPSY;  Surgeon: Urban Garden, MD;  [facility];  Service: Gastroenterology;;   CARDIAC CATHETERIZATION  01/15/2013   CARDIAC CATHETERIZATION N/A 05/16/2014   Procedure: Left Heart Cath and Coronary Angiography;  Surgeon: Peter M Swaziland, MD;  Location: [facility] INVASIVE CV LAB;  Service: Cardiovascular;  Laterality: N/A;   COLONOSCOPY N/A 05/02/2013   Procedure: COLONOSCOPY;  Surgeon: Ruby Corporal, MD;  [facility];  Service: Endoscopy;  Laterality: N/A;  200-moved to 1200 Ann notified pt   COLONOSCOPY WITH PROPOFOL  N/A 05/08/2020   Procedure: COLONOSCOPY WITH PROPOFOL ;  Surgeon: Urban Garden, MD;  [facility];  Service: Gastroenterology;  Laterality: N/A;  AM   COLONOSCOPY WITH PROPOFOL  N/A 06/19/2020   Procedure: COLONOSCOPY WITH PROPOFOL ;  Surgeon: Urban Garden, MD;  [facility];  Service: Gastroenterology;  Laterality: N/A;  [id number]   COLONOSCOPY WITH PROPOFOL  N/A 07/17/2020    Procedure: COLONOSCOPY WITH PROPOFOL ;  Surgeon: Urban Garden, MD;  [facility];  Service: Gastroenterology;  Laterality: N/A;  9:00   CORONARY STENT PLACEMENT     2003, 2004   ESOPHAGOGASTRODUODENOSCOPY N/A 05/02/2013   Procedure: ESOPHAGOGASTRODUODENOSCOPY (EGD);  Surgeon: Ruby Corporal, MD;  [facility];  Service: Endoscopy;  Laterality: N/A;   HEMORRHOID SURGERY  2022   HEMOSTASIS CLIP PLACEMENT  07/17/2020   Procedure: HEMOSTASIS CLIP PLACEMENT;  Surgeon: Urban Garden, MD;  [facility];  Service: Gastroenterology;;   HIP ARTHROPLASTY     KNEE SURGERY Left 12/03/2013   LEFT HEART CATHETERIZATION WITH CORONARY ANGIOGRAM N/A 01/15/2013   Procedure: LEFT HEART CATHETERIZATION WITH CORONARY ANGIOGRAM;  Surgeon: Mickiel Albany, MD;  Location: [facility] CATH LAB;  Service: Cardiovascular;  Laterality: N/A;   MALONEY DILATION N/A 05/02/2013   Procedure: Londa Rival DILATION;  Surgeon: Ruby Corporal, MD;  [facility];  Service: Endoscopy;  Laterality: N/A;  POLYPECTOMY  07/17/2020   Procedure: POLYPECTOMY;  Surgeon: Umberto Ganong, Bearl Limes, MD;  [facility];  Service: Gastroenterology;;   Dixie Frederickson DILATION N/A 05/02/2013   Procedure: Dixie Frederickson DILATION;  Surgeon: Ruby Corporal, MD;  [facility];  Service: Endoscopy;  Laterality: N/A;   TONSILLECTOMY     Family History  Problem Relation Age of Onset   Stroke Mother    CAD Father        MI at age 77   Colon cancer Neg Hx    Social History   Socioeconomic History   Marital status: Widowed    Spouse name: Not on file   Number of children: 2   Years of education: Not on file   Highest education level: 11th grade  Occupational History   Occupation: Patent attorney: Barrister's clerk   Occupation: Retired   Tobacco Use   Smoking status: Former    Current packs/day: 0.00    Average packs/day: 3.0 packs/day for 20.0 years (60.0 ttl pk-yrs)     Types: Cigarettes    Start date: 05/26/1955    Quit date: 05/26/1975    Years since quitting: 48.0   Smokeless tobacco: Current    Types: Chew  Vaping Use   Vaping status: Never Used  Substance and Sexual Activity   Alcohol use: No   Drug use: No   Sexual activity: Not Currently  Other Topics Concern   Not on file  Social History Narrative   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 - 2 children one boy,one girl.    Son is living with him right now - 2023   Social Drivers of Health   Financial Resource Strain: Low Risk  (05/23/2023)   Overall Financial Resource Strain (CARDIA)    Difficulty of Paying Living Expenses: Not hard at all  Food Insecurity: No Food Insecurity (05/23/2023)   Hunger Vital Sign    Worried About Running Out of Food in the Last Year: Never true    Ran Out of Food in the Last Year: Never true  Transportation Needs: No Transportation Needs (05/23/2023)   PRAPARE - Administrator, Civil Service (Medical): No    Lack of Transportation (Non-Medical): No  Physical Activity: Sufficiently Active (05/23/2023)   Exercise Vital Sign    Days of Exercise per Week: 2 days    Minutes of Exercise per Session: 120 min  Stress: No Stress Concern Present (05/23/2023)   [employer] of Occupational Health - Occupational Stress Questionnaire    Feeling of Stress : Not at all  Social Connections: Moderately Integrated (05/23/2023)   Social Connection and Isolation Panel [NHANES]    Frequency of Communication with Friends and Family: More than three times a week    Frequency of Social Gatherings with Friends and Family: More than three times a week    Attends Religious Services: More than 4 times per year    Active Member of [employer] or Organizations: Yes    Attends Banker Meetings: More than 4 times per year    Marital Status: Widowed    Tobacco Counseling Ready to quit: Not Answered Counseling given:  Yes    Clinical Intake:  Pre-visit preparation completed: Yes  Pain : No/denies pain     BMI - recorded: 29.03 Nutritional Status: BMI 25 -29 Overweight Nutritional Risks: None Diabetes: No  Lab Results  Component Value Date   HGBA1C  08/06/2008  5.8 (NOTE) The ADA recommends the following therapeutic goal for glycemic control related to Hgb A1c measurement: Goal of therapy: <6.5 Hgb A1c  Reference: American Diabetes Association: Clinical Practice Recommendations 2010, Diabetes Care, 2010, 33: (Suppl  1).   HGBA1C 5.9 04/12/2006     How often do you need to have someone help you when you read instructions, pamphlets, or other written materials from your doctor or pharmacy?: 1 - Never  Interpreter Needed?: No  Information entered by :: Alia T/cma   Activities of Daily Living     05/23/2023    2:17 PM  In your present state of health, do you have any difficulty performing the following activities:  Hearing? 1  Comment has hearing aid w/R ear  Vision? 0  Comment pt wear glasses/ pt goes to [facility] in [place], [place]  Difficulty concentrating or making decisions? 0  Walking or climbing stairs? 0  Dressing or bathing? 0  Doing errands, shopping? 0  Preparing Food and eating ? N  Using the Toilet? N  In the past six months, have you accidently leaked urine? N  Do you have problems with loss of bowel control? N  Managing your Medications? N  Managing your Finances? N  Housekeeping or managing your Housekeeping? N    Patient Care Team: Dettinger, Lucio Sabin, MD as PCP - General (Family Medicine) Swaziland, Peter M, MD as PCP - Cardiology (Cardiology) Swazil[place], Peter M, MD as Consulting Physician (Cardiology) Hyland Mailman, MD as Referring Physician (Optometry) Urban Garden, MD as Consulting Physician (Gastroenterology) Ervin Heath, [place] (Cardiology)  Indicate any recent Medical Services you may have received from other than [facility] providers in the past year  (date may be approximate).     Assessment:    This is a routine wellness examination for [place]y.  Hearing/Vision screen Hearing Screening - Comments:: Pt wears hearing aid in the r-ear Vision Screening - Comments:: pt wear glasses/ pt goes to [facility] in [place], [place]   Goals Addressed             This Visit's Progress    DIET - INCREASE WATER  INTAKE   On track    Drink at least 6-8 glasses a day        Depression Screen     05/23/2023    2:24 PM 03/24/2023   10:41 AM 10/20/2022    1:54 PM 10/13/2022   10:04 AM 06/15/2022    8:58 AM 05/05/2022    3:24 PM 05/05/2022    3:23 PM  PHQ 2/9 Scores  PHQ - 2 Score 0 0 0 0 0 0 0  PHQ- 9 Score 1  1 1 1  0 0    Fall Risk     05/23/2023    2:32 PM 03/24/2023   10:41 AM 10/20/2022    1:54 PM 10/13/2022   10:04 AM 06/15/2022    8:58 AM  Fall Risk   Falls in the past year? 0 0 0 0 0  Number falls in past yr: 0      Injury with Fall? 0      Risk for fall due to : No Fall Risks      Follow up Falls evaluation completed        MEDICARE RISK AT HOME:  Medicare Risk at Home Any stairs in or around the home?: Yes If so, are there any without handrails?: Yes Home free of loose throw rugs in walkways, pet beds, electrical cords, etc?: Yes Adequate  lighting in your home to reduce risk of falls?: Yes Life alert?: No Use of a cane, walker or w/c?: No Grab bars in the bathroom?: No Shower chair or bench in shower?: No Elevated toilet seat or a handicapped toilet?: Yes  TIMED UP AND GO:  Was the test performed?  no  Cognitive Function: 6CIT completed    12/12/2017    9:25 AM 12/07/2016   12:15 PM  MMSE - Mini Mental State Exam  Orientation to time 5 5  Orientation to Place 5 5  Registration 3 3  Attention/ Calculation 3 2  Recall 3 2  Language- name 2 objects 2 2  Language- repeat 1 1  Language- follow 3 step command 3 3  Language- read & follow direction 1 1  Write a sentence 1 1  Copy design 1 1  Total score 28 26         05/23/2023    2:25 PM 05/05/2022    3:25 PM 04/14/2021    8:24 AM 04/11/2019    9:02 AM 04/11/2019    8:37 AM  6CIT Screen  What Year? 0 points 0 points 0 points 0 points 0 points  What month? 3 points 0 points 3 points 3 points 3 points  What time? 0 points 0 points 0 points 0 points 0 points  Count back from 20 0 points 0 points 0 points 0 points   Months in reverse 4 points 0 points 4 points 2 points   Repeat phrase 2 points 0 points 6 points 0 points   Total Score 9 points 0 points 13 points 5 points     Immunizations Immunization History  Administered Date(s) Administered   Fluad Quad(high Dose 65+) 09/20/2018   Influenza Inj Mdck Quad With Preservative 09/29/2017   Influenza Split 10/06/2012   Influenza, High Dose Seasonal PF 09/14/2013   Influenza,inj,Quad PF,6+ Mos 10/01/2014, 09/30/2016   Influenza-Unspecified 10/07/2015, 10/02/2017   Pneumococcal Conjugate-13 12/24/2012   Pneumococcal Polysaccharide-23 10/02/2017   Pneumococcal-Unspecified 10/02/2017   Tdap 04/04/2013    Screening Tests Health Maintenance  Topic Date Due   Zoster Vaccines- Shingrix (1 of 2) Never done   DTaP/Tdap/Td (2 - Td or Tdap) 04/05/2023   Colonoscopy  07/18/2023   INFLUENZA VACCINE  08/04/2023   Medicare Annual Wellness (AWV)  05/22/2024   Pneumonia Vaccine 63+ Years old  Completed   Hepatitis C Screening  Completed   HPV VACCINES  Aged Out   Meningococcal B Vaccine  Aged Out   COVID-19 Vaccine  Discontinued    Health Maintenance  Health Maintenance Due  Topic Date Due   Zoster Vaccines- Shingrix (1 of 2) Never done   DTaP/Tdap/Td (2 - Td or Tdap) 04/05/2023   Colonoscopy  07/18/2023   Health Maintenance Items Addressed: See Nurse Notes  Additional Screening:  Vision Screening: Recommended annual ophthalmology exams for early detection of glaucoma and other disorders of the eye.  Dental Screening: Recommended annual dental exams for proper oral hygiene  Community Resource  Referral / Chronic Care Management: CRR required this visit?  No   CCM required this visit?  No   Plan:    I have personally reviewed and noted the following in the patient's chart:   Medical and social history Use of alcohol, tobacco or illicit drugs  Current medications and supplements including opioid prescriptions. Patient is not currently taking opioid prescriptions. Functional ability and status Nutritional status Physical activity Advanced directives List of other physicians Hospitalizations, surgeries, and ER  visits in previous 12 months Vitals Screenings to include cognitive, depression, and falls Referrals and appointments  In addition, I have reviewed and discussed with patient certain preventive protocols, quality metrics, and best practice recommendations. A written personalized care plan for preventive services as well as general preventive health recommendations were provided to patient.   Michaelle Adolphus, CMA   05/23/2023   After Visit Summary: (MyChart) Due to this being a telephonic visit, the after visit summary with patients personalized plan was offered to patient via MyChart   Notes: Per pt aware he is due for Shingles vaccine (pt declined), tetanus vaccine. Colonoscopy is due in 7/25 pt is aware. Also see routing msg

## 2023-06-09 ENCOUNTER — Encounter (INDEPENDENT_AMBULATORY_CARE_PROVIDER_SITE_OTHER): Payer: Self-pay | Admitting: *Deleted

## 2023-06-14 ENCOUNTER — Other Ambulatory Visit: Payer: Self-pay | Admitting: Family Medicine

## 2023-06-14 DIAGNOSIS — I10 Essential (primary) hypertension: Secondary | ICD-10-CM

## 2023-06-14 DIAGNOSIS — E782 Mixed hyperlipidemia: Secondary | ICD-10-CM

## 2023-06-14 DIAGNOSIS — F320 Major depressive disorder, single episode, mild: Secondary | ICD-10-CM

## 2023-06-23 ENCOUNTER — Other Ambulatory Visit: Payer: Self-pay | Admitting: Family Medicine

## 2023-06-23 DIAGNOSIS — Z0289 Encounter for other administrative examinations: Secondary | ICD-10-CM

## 2023-06-23 DIAGNOSIS — M4712 Other spondylosis with myelopathy, cervical region: Secondary | ICD-10-CM

## 2023-06-23 DIAGNOSIS — G8929 Other chronic pain: Secondary | ICD-10-CM

## 2023-07-06 ENCOUNTER — Ambulatory Visit (INDEPENDENT_AMBULATORY_CARE_PROVIDER_SITE_OTHER): Admitting: Family Medicine

## 2023-07-06 ENCOUNTER — Encounter: Payer: Self-pay | Admitting: Family Medicine

## 2023-07-06 VITALS — BP 118/70 | HR 71 | Ht 73.0 in | Wt 212.0 lb

## 2023-07-06 DIAGNOSIS — Z0289 Encounter for other administrative examinations: Secondary | ICD-10-CM | POA: Diagnosis not present

## 2023-07-06 DIAGNOSIS — Z79899 Other long term (current) drug therapy: Secondary | ICD-10-CM | POA: Diagnosis not present

## 2023-07-06 DIAGNOSIS — G8929 Other chronic pain: Secondary | ICD-10-CM | POA: Diagnosis not present

## 2023-07-06 DIAGNOSIS — M4712 Other spondylosis with myelopathy, cervical region: Secondary | ICD-10-CM | POA: Diagnosis not present

## 2023-07-06 DIAGNOSIS — I10 Essential (primary) hypertension: Secondary | ICD-10-CM | POA: Diagnosis not present

## 2023-07-06 DIAGNOSIS — E079 Disorder of thyroid, unspecified: Secondary | ICD-10-CM

## 2023-07-06 DIAGNOSIS — E782 Mixed hyperlipidemia: Secondary | ICD-10-CM | POA: Diagnosis not present

## 2023-07-06 LAB — LIPID PANEL

## 2023-07-06 MED ORDER — OXYCODONE-ACETAMINOPHEN 7.5-325 MG PO TABS: 1.0000 | ORAL_TABLET | Freq: Four times a day (QID) | ORAL | 0 refills | Status: DC | PRN

## 2023-07-06 MED ORDER — AMLODIPINE BESYLATE 5 MG PO TABS: 5.0000 mg | ORAL_TABLET | Freq: Every day | ORAL | 3 refills | Status: AC

## 2023-07-06 MED ORDER — OXYCODONE-ACETAMINOPHEN 7.5-325 MG PO TABS
1.0000 | ORAL_TABLET | Freq: Four times a day (QID) | ORAL | 0 refills | Status: DC | PRN
Start: 2023-08-04 — End: 2023-09-25

## 2023-07-06 NOTE — Progress Notes (Signed)
 BP 118/70   Pulse 71   Ht 6' 1 (1.854 m)   Wt 212 lb (96.2 kg)   SpO2 92%   BMI 27.97 kg/m    Subjective:   Patient ID: [name], male    DOB: 04/06/1945, 78 y.o.   MRN: [id number]  HPI: [name] is a 78 y.o. male presenting on 07/06/2023 for Medical Management of Chronic Issues, Hypertension, and Osteoarthritis   HPI Hypertension Patient is currently on lisinopril  and amlodipine , and their blood pressure today is 118/70. Patient has the occasional dizziness when he stands up quickly. Patient denies headaches, blurred vision, chest pains, shortness of breath, or weakness. Denies any side effects from medication and is content with current medication.   COPD Patient is coming in for COPD recheck today.  He is currently on no medication currently, still chews tobacco.  He has a mild chronic cough but denies any major coughing spells or wheezing spells.  He has 0 nighttime symptoms per week and 0 daytime symptoms per week currently.   Hypothyroidism recheck Patient is coming in for thyroid  recheck today as well. They deny any issues with hair changes or heat or cold problems or diarrhea or constipation. They deny any chest pain or palpitations. They are currently on levothyroxine  200 micrograms   Hyperlipidemia Patient is coming in for recheck of his hyperlipidemia. The patient is currently taking simvastatin . They deny any issues with myalgias or history of liver damage from it. They deny any focal numbness or weakness or chest pain.   Pain assessment: Cause of pain-degenerative disc disease cervical and lumbar Pain location-neck and lower back Pain on scale of 1-10- 5 Frequency-Daily What increases pain-being on his feet working and moving What makes pain Better-medication Effects on ADL -minimal Any change in general medical condition-none  Current opioids rx-oxycodone  7.5-3 25 -4 times daily as needed # meds rx-120/month Effectiveness of current meds-works  well Adverse reactions from pain meds-none Morphine  equivalent-45  Pill count performed-No Last drug screen -06/29/2022 ( high risk q18m, moderate risk q42m, low risk yearly ) Urine drug screen today- Yes Was the [facility] reviewed-yes  If yes were their any concerning findings? -None    03/27/2019    8:56 AM  Opioid Risk   Alcohol 0   Illegal Drugs 0  Rx Drugs 0  Alcohol 0  Illegal Drugs 0  Rx Drugs 0  Age between 16-45 years  0  History of Preadolescent Sexual Abuse 0  Psychological Disease 0  Depression 1  Opioid Risk Tool Scoring 1  Opioid Risk Interpretation Low Risk     Data saved with a previous flowsheet row definition  Pain contract signed on: Today   Relevant past medical, surgical, family and social history reviewed and updated as indicated. Interim medical history since our last visit reviewed. Allergies and medications reviewed and updated.  Review of Systems  Constitutional:  Negative for chills and fever.  Respiratory:  Negative for shortness of breath and wheezing.   Cardiovascular:  Negative for chest pain and leg swelling.  Musculoskeletal:  Negative for back pain and gait problem.  Skin:  Negative for rash.  Neurological:  Positive for dizziness. Negative for numbness.  All other systems reviewed and are negative.   Per HPI unless specifically indicated above   Allergies as of 07/06/2023   No Known Allergies      Medication List        Accurate as of July 06, 2023 10:47 AM. If  you have any questions, ask your nurse or doctor.          albuterol  108 (90 Base) MCG/ACT inhaler Commonly known as: ProAir  HFA Inhale 2 puffs into the lungs every 6 (six) hours as needed for wheezing or shortness of breath.   amLODipine  5 MG tablet Commonly known as: NORVASC  Take 1 tablet (5 mg total) by mouth daily. What changed:  medication strength how much to take Changed by: Fonda LABOR Caulder Wehner   aspirin  EC 81 MG tablet Take 1 tablet (81 mg total) by mouth  daily.   levothyroxine  200 MCG tablet Commonly known as: SYNTHROID  TAKE ONE TABLET ONCE DAILY   lisinopril  20 MG tablet Commonly known as: ZESTRIL  TAKE 1 TABLET DAILY   multivitamin with minerals Tabs tablet Take 1 tablet by mouth daily.   naloxegol  oxalate 12.5 MG Tabs tablet Commonly known as: Movantik  Take 1 tablet (12.5 mg total) by mouth daily.   nitroGLYCERIN  0.4 MG SL tablet Commonly known as: NITROSTAT  Place 1 tablet (0.4 mg total) under the tongue every 5 (five) minutes as needed for chest pain.   oxyCODONE -acetaminophen  7.5-325 MG tablet Commonly known as: PERCOCET Take 1 tablet by mouth every 6 (six) hours as needed for severe pain (pain score 7-10). What changed: Another medication with the same name was changed. Make sure you understand how and when to take each. Changed by: Fonda LABOR Shirrell Solinger   oxyCODONE -acetaminophen  7.5-325 MG tablet Commonly known as: PERCOCET Take 1 tablet by mouth every 6 (six) hours as needed for severe pain (pain score 7-10). Start taking on: August 04, 2023 What changed: These instructions start on August 04, 2023. If you are unsure what to do until then, ask your doctor or other care provider. Changed by: Fonda LABOR Dent Plantz   oxyCODONE -acetaminophen  7.5-325 MG tablet Commonly known as: PERCOCET Take 1 tablet by mouth every 6 (six) hours as needed for severe pain (pain score 7-10). Start taking on: September 05, 2023 What changed: These instructions start on September 05, 2023. If you are unsure what to do until then, ask your doctor or other care provider. Changed by: Fonda LABOR Atharv Barriere   sertraline  50 MG tablet Commonly known as: ZOLOFT  TAKE ONE TABLET DAILY   sildenafil  20 MG tablet Commonly known as: REVATIO  TAKE UP TO 3 TABLETS AS DIRECTED WHEN NEEDED   simvastatin  40 MG tablet Commonly known as: ZOCOR  TAKE ONE TABLET AT BEDTIME   VITAMIN B-12 PO Take 2,500 mcg by mouth daily.         Objective:   BP 118/70    Pulse 71   Ht 6' 1 (1.854 m)   Wt 212 lb (96.2 kg)   SpO2 92%   BMI 27.97 kg/m   Wt Readings from Last 3 Encounters:  07/06/23 212 lb (96.2 kg)  05/23/23 220 lb (99.8 kg)  03/24/23 220 lb (99.8 kg)    Physical Exam Vitals and nursing note reviewed.  Constitutional:      General: He is not in acute distress.    Appearance: He is well-developed. He is not diaphoretic.  Eyes:     General: No scleral icterus.    Conjunctiva/sclera: Conjunctivae normal.  Neck:     Thyroid : No thyromegaly.  Cardiovascular:     Rate and Rhythm: Normal rate and regular rhythm.     Heart sounds: Normal heart sounds. No murmur heard. Pulmonary:     Effort: Pulmonary effort is normal. No respiratory distress.     Breath sounds: Normal breath  sounds. No wheezing.  Musculoskeletal:        General: No swelling. Normal range of motion.     Cervical back: Neck supple.  Lymphadenopathy:     Cervical: No cervical adenopathy.  Skin:    General: Skin is warm and dry.     Findings: No rash.  Neurological:     Mental Status: He is alert and oriented to person, place, and time.     Coordination: Coordination normal.  Psychiatric:        Behavior: Behavior normal.       Assessment & Plan:   Problem List Items Addressed This Visit       Cardiovascular and Mediastinum   Essential hypertension - Primary   Relevant Medications   amLODipine  (NORVASC ) 5 MG tablet   Other Relevant Orders   CBC with Differential/Platelet   CMP14+EGFR   Lipid panel   PSA, total and free     Endocrine   Thyroid  disease   Relevant Orders   TSH     Nervous and Auditory   Osteoarthritis of cervical spine with myelopathy   Relevant Medications   oxyCODONE -acetaminophen  (PERCOCET) 7.5-325 MG tablet (Start on 08/04/2023)   oxyCODONE -acetaminophen  (PERCOCET) 7.5-325 MG tablet (Start on 09/05/2023)   oxyCODONE -acetaminophen  (PERCOCET) 7.5-325 MG tablet     Other   Hyperlipidemia   Relevant Medications   amLODipine   (NORVASC ) 5 MG tablet   Other Relevant Orders   CBC with Differential/Platelet   CMP14+EGFR   Lipid panel   PSA, total and free   Pain management contract signed   Relevant Medications   oxyCODONE -acetaminophen  (PERCOCET) 7.5-325 MG tablet (Start on 08/04/2023)   oxyCODONE -acetaminophen  (PERCOCET) 7.5-325 MG tablet (Start on 09/05/2023)   oxyCODONE -acetaminophen  (PERCOCET) 7.5-325 MG tablet   Other Visit Diagnoses       Controlled substance agreement signed       Relevant Orders   ToxASSURE Select 13 (MW), Urine     Encounter for chronic pain management       Relevant Medications   oxyCODONE -acetaminophen  (PERCOCET) 7.5-325 MG tablet (Start on 08/04/2023)   oxyCODONE -acetaminophen  (PERCOCET) 7.5-325 MG tablet (Start on 09/05/2023)   oxyCODONE -acetaminophen  (PERCOCET) 7.5-325 MG tablet       Patient will do blood work on the way out.  He seems to be doing well otherwise. Follow up plan: Return in about 3 months (around 10/06/2023), or if symptoms worsen or fail to improve, for Hypertension and hypothyroidism and pain management.  Counseling provided for all of the vaccine components Orders Placed This Encounter  Procedures   CBC with Differential/Platelet   CMP14+EGFR   Lipid panel   PSA, total and free   ToxASSURE Select 13 (MW), Urine   TSH    Fonda Levins, MD [facility] 07/06/2023, 10:47 AM

## 2023-07-07 LAB — CBC WITH DIFFERENTIAL/PLATELET
Basophils Absolute: 0 x10E3/uL (ref 0.0–0.2)
Basos: 0 %
EOS (ABSOLUTE): 0.2 x10E3/uL (ref 0.0–0.4)
Eos: 3 %
Hematocrit: 48.8 % (ref 37.5–51.0)
Hemoglobin: 15.9 g/dL (ref 13.0–17.7)
Immature Grans (Abs): 0 x10E3/uL (ref 0.0–0.1)
Immature Granulocytes: 0 %
Lymphocytes Absolute: 2.3 x10E3/uL (ref 0.7–3.1)
Lymphs: 31 %
MCH: 32.3 pg (ref 26.6–33.0)
MCHC: 32.6 g/dL (ref 31.5–35.7)
MCV: 99 fL — ABNORMAL HIGH (ref 79–97)
Monocytes Absolute: 0.7 x10E3/uL (ref 0.1–0.9)
Monocytes: 10 %
Neutrophils Absolute: 4.2 x10E3/uL (ref 1.4–7.0)
Neutrophils: 56 %
Platelets: 274 x10E3/uL (ref 150–450)
RBC: 4.92 x10E6/uL (ref 4.14–5.80)
RDW: 13.2 % (ref 11.6–15.4)
WBC: 7.5 x10E3/uL (ref 3.4–10.8)

## 2023-07-07 LAB — CMP14+EGFR
ALT: 10 IU/L (ref 0–44)
AST: 16 IU/L (ref 0–40)
Albumin: 4.3 g/dL (ref 3.8–4.8)
Alkaline Phosphatase: 68 IU/L (ref 44–121)
BUN/Creatinine Ratio: 15 (ref 10–24)
BUN: 15 mg/dL (ref 8–27)
Bilirubin Total: 0.3 mg/dL (ref 0.0–1.2)
CO2: 23 mmol/L (ref 20–29)
Calcium: 9.7 mg/dL (ref 8.6–10.2)
Chloride: 105 mmol/L (ref 96–106)
Creatinine, Ser: 1.03 mg/dL (ref 0.76–1.27)
Globulin, Total: 2.5 g/dL (ref 1.5–4.5)
Glucose: 103 mg/dL — ABNORMAL HIGH (ref 70–99)
Potassium: 4.2 mmol/L (ref 3.5–5.2)
Sodium: 141 mmol/L (ref 134–144)
Total Protein: 6.8 g/dL (ref 6.0–8.5)
eGFR: 74 mL/min/1.73 (ref >59)

## 2023-07-07 LAB — PSA, TOTAL AND FREE
PSA, Free Pct: 32.9 %
PSA, Free: 0.23 ng/mL
Prostate Specific Ag, Serum: 0.7 ng/mL (ref 0.0–4.0)

## 2023-07-07 LAB — LIPID PANEL
Chol/HDL Ratio: 2.7 ratio (ref 0.0–5.0)
Cholesterol, Total: 156 mg/dL (ref 100–199)
HDL: 57 mg/dL (ref >39)
LDL Chol Calc (NIH): 79 mg/dL (ref 0–99)
Triglycerides: 111 mg/dL (ref 0–149)
VLDL Cholesterol Cal: 20 mg/dL (ref 5–40)

## 2023-07-07 LAB — TSH: TSH: 3.61 u[IU]/mL (ref 0.450–4.500)

## 2023-07-10 ENCOUNTER — Ambulatory Visit: Payer: Self-pay | Admitting: Family Medicine

## 2023-07-10 NOTE — Telephone Encounter (Signed)
 Copied from CRM [id number]. Topic: Clinical - Lab/Test Results >> Jul 10, 2023 11:19 AM Delon DASEN wrote: Reason for CRM: read results verbatim, no questions

## 2023-07-11 LAB — TOXASSURE SELECT 13 (MW), URINE

## 2023-09-07 ENCOUNTER — Other Ambulatory Visit: Payer: Self-pay | Admitting: Family Medicine

## 2023-09-07 DIAGNOSIS — I10 Essential (primary) hypertension: Secondary | ICD-10-CM

## 2023-09-07 DIAGNOSIS — E782 Mixed hyperlipidemia: Secondary | ICD-10-CM

## 2023-09-07 DIAGNOSIS — F320 Major depressive disorder, single episode, mild: Secondary | ICD-10-CM

## 2023-09-25 ENCOUNTER — Encounter: Payer: Self-pay | Admitting: Family Medicine

## 2023-09-25 ENCOUNTER — Ambulatory Visit: Admitting: Family Medicine

## 2023-09-25 DIAGNOSIS — M4712 Other spondylosis with myelopathy, cervical region: Secondary | ICD-10-CM

## 2023-09-25 DIAGNOSIS — G8929 Other chronic pain: Secondary | ICD-10-CM | POA: Diagnosis not present

## 2023-09-25 DIAGNOSIS — Z0289 Encounter for other administrative examinations: Secondary | ICD-10-CM

## 2023-09-25 MED ORDER — OXYCODONE-ACETAMINOPHEN 7.5-325 MG PO TABS: 1.0000 | ORAL_TABLET | Freq: Four times a day (QID) | ORAL | 0 refills | Status: DC | PRN

## 2023-09-25 NOTE — Progress Notes (Signed)
 BP (!) 156/75   Pulse 72   Ht 6' 1 (1.854 m)   Wt 218 lb (98.9 kg)   SpO2 93%   BMI 28.76 kg/m    Subjective:   Patient ID: [name], male    DOB: [birth year], 78 y.o.   MRN: [id number]  HPI: [name] is a 78 y.o. male presenting on 09/25/2023 for Osteoarthritis (Right knee)   Discussed the use of AI scribe software for clinical note transcription with the patient, who gave verbal consent to proceed.  History of Present Illness   [name] is a 78 year old male who presents for a recheck of pain management.  He experiences persistent pain affecting his knee, back, and overall body, describing it as constant and stating, 'I hurt all the time.' He is currently taking pain medication four times a day, which he feels is only 'okay' in managing his symptoms. Despite the pain, he is able to cope and continue with his daily activities for now.  He has a history of receiving steroid injections for his knee, with the most recent injection occurring 'not too awful long ago.' He mentions that he has been on his current pain medication for a long time and is concerned about building a tolerance to it.  He experiences some swelling in both legs, which he manages by propping them up when sitting. He also uses a heating pad to help alleviate back pain.  No significant issues with breathing or heart function. No significant swallowing difficulties.           Relevant past medical, surgical, family and social history reviewed and updated as indicated. Interim medical history since our last visit reviewed. Allergies and medications reviewed and updated.  Review of Systems  Constitutional:  Negative for chills and fever.  Respiratory:  Negative for shortness of breath and wheezing.   Cardiovascular:  Negative for chest pain and leg swelling.  Musculoskeletal:  Positive for arthralgias, back pain and myalgias. Negative for gait problem.  Skin:  Negative for rash.  All  other systems reviewed and are negative.   Per HPI unless specifically indicated above   Allergies as of 09/25/2023   No Known Allergies      Medication List        Accurate as of September 25, 2023  2:52 PM. If you have any questions, ask your nurse or doctor.          albuterol  108 (90 Base) MCG/ACT inhaler Commonly known as: ProAir  HFA Inhale 2 puffs into the lungs every 6 (six) hours as needed for wheezing or shortness of breath.   amLODipine  5 MG tablet Commonly known as: NORVASC  Take 1 tablet (5 mg total) by mouth daily.   aspirin  EC 81 MG tablet Take 1 tablet (81 mg total) by mouth daily.   levothyroxine  200 MCG tablet Commonly known as: SYNTHROID  TAKE ONE TABLET ONCE DAILY   lisinopril  20 MG tablet Commonly known as: ZESTRIL  TAKE ONE TABLET ONCE DAILY   multivitamin with minerals Tabs tablet Take 1 tablet by mouth daily.   naloxegol  oxalate 12.5 MG Tabs tablet Commonly known as: Movantik  Take 1 tablet (12.5 mg total) by mouth daily.   nitroGLYCERIN  0.4 MG SL tablet Commonly known as: NITROSTAT  Place 1 tablet (0.4 mg total) under the tongue every 5 (five) minutes as needed for chest pain.   oxyCODONE -acetaminophen  7.5-325 MG tablet Commonly known as: PERCOCET Take 1 tablet by mouth every 6 (six) hours  as needed for severe pain (pain score 7-10). What changed: Another medication with the same name was changed. Make sure you understand how and when to take each. Changed by: Fonda LABOR Ayham Word   oxyCODONE -acetaminophen  7.5-325 MG tablet Commonly known as: PERCOCET Take 1 tablet by mouth every 6 (six) hours as needed for severe pain (pain score 7-10). Start taking on: October 24, 2023 What changed: These instructions start on October 24, 2023. If you are unsure what to do until then, ask your doctor or other care provider. Changed by: Fonda LABOR Saleen Peden   oxyCODONE -acetaminophen  7.5-325 MG tablet Commonly known as: PERCOCET Take 1 tablet by mouth  every 6 (six) hours as needed for severe pain (pain score 7-10). Start taking on: November 24, 2023 What changed: These instructions start on November 24, 2023. If you are unsure what to do until then, ask your doctor or other care provider. Changed by: Fonda LABOR Lakishia Bourassa   sertraline  50 MG tablet Commonly known as: ZOLOFT  TAKE ONE TABLET DAILY   sildenafil  20 MG tablet Commonly known as: REVATIO  TAKE UP TO 3 TABLETS AS DIRECTED WHEN NEEDED   simvastatin  40 MG tablet Commonly known as: ZOCOR  TAKE ONE TABLET AT BEDTIME   VITAMIN B-12 PO Take 2,500 mcg by mouth daily.         Objective:   BP (!) 156/75   Pulse 72   Ht 6' 1 (1.854 m)   Wt 218 lb (98.9 kg)   SpO2 93%   BMI 28.76 kg/m   Wt Readings from Last 3 Encounters:  09/25/23 218 lb (98.9 kg)  07/06/23 212 lb (96.2 kg)  05/23/23 220 lb (99.8 kg)    Physical Exam Vitals and nursing note reviewed.    Physical Exam   CHEST: Lungs clear to auscultation bilaterally. CARDIOVASCULAR: Regular rate and rhythm, no murmurs. EXTREMITIES: Mild bilateral leg swelling.         Assessment & Plan:   Problem List Items Addressed This Visit       Nervous and Auditory   Osteoarthritis of cervical spine with myelopathy   Relevant Medications   oxyCODONE -acetaminophen  (PERCOCET) 7.5-325 MG tablet (Start on 10/24/2023)   oxyCODONE -acetaminophen  (PERCOCET) 7.5-325 MG tablet (Start on 11/24/2023)   oxyCODONE -acetaminophen  (PERCOCET) 7.5-325 MG tablet     Other   Pain management contract signed   Relevant Medications   oxyCODONE -acetaminophen  (PERCOCET) 7.5-325 MG tablet (Start on 10/24/2023)   oxyCODONE -acetaminophen  (PERCOCET) 7.5-325 MG tablet (Start on 11/24/2023)   oxyCODONE -acetaminophen  (PERCOCET) 7.5-325 MG tablet   Other Visit Diagnoses       Encounter for chronic pain management       Relevant Medications   oxyCODONE -acetaminophen  (PERCOCET) 7.5-325 MG tablet (Start on 10/24/2023)   oxyCODONE -acetaminophen   (PERCOCET) 7.5-325 MG tablet (Start on 11/24/2023)   oxyCODONE -acetaminophen  (PERCOCET) 7.5-325 MG tablet           Chronic pain involving multiple sites, including osteoarthritis of the knee and chronic low back pain Pain persistent but tolerable. Current medication regimen may lead to tolerance. Prefers to avoid surgery. - Continue current pain medication regimen. - Consider alternative pain medication if current regimen becomes ineffective. - Consider steroid injections for specific joint pain if needed. - Use heating pad for back pain management.  Bilateral lower extremity edema Mild bilateral lower extremity edema present. - Advise to prop legs up when sitting.      Pain agreement signed 07/18/2023 and urine drug screen performed on the same day.  Patient is currently taking oxycodone  7.5-3  25 4 times daily as needed and gets 120 tablets/month.  Morphine  mill equivalents of 45/day. [place]  drug database was reviewed and refills have been consistent not refilling too early.    Follow up plan: Return in about 3 months (around 12/25/2023), or if symptoms worsen or fail to improve, for Diabetes and pain management.  Counseling provided for all of the vaccine components No orders of the defined types were placed in this encounter.   Fonda Levins, MD [facility] 09/25/2023, 2:52 PM

## 2023-11-06 ENCOUNTER — Encounter: Payer: Self-pay | Admitting: Radiology

## 2023-12-05 ENCOUNTER — Other Ambulatory Visit: Payer: Self-pay | Admitting: Family Medicine

## 2023-12-05 DIAGNOSIS — F320 Major depressive disorder, single episode, mild: Secondary | ICD-10-CM

## 2023-12-05 DIAGNOSIS — I10 Essential (primary) hypertension: Secondary | ICD-10-CM

## 2023-12-05 DIAGNOSIS — E782 Mixed hyperlipidemia: Secondary | ICD-10-CM

## 2023-12-21 ENCOUNTER — Ambulatory Visit: Payer: Self-pay | Admitting: Family Medicine

## 2023-12-21 ENCOUNTER — Encounter: Payer: Self-pay | Admitting: Family Medicine

## 2023-12-21 VITALS — BP 118/57 | HR 81 | Ht 73.0 in | Wt 213.0 lb

## 2023-12-21 DIAGNOSIS — E079 Disorder of thyroid, unspecified: Secondary | ICD-10-CM | POA: Diagnosis not present

## 2023-12-21 DIAGNOSIS — G8929 Other chronic pain: Secondary | ICD-10-CM

## 2023-12-21 DIAGNOSIS — E782 Mixed hyperlipidemia: Secondary | ICD-10-CM

## 2023-12-21 DIAGNOSIS — M4712 Other spondylosis with myelopathy, cervical region: Secondary | ICD-10-CM | POA: Diagnosis not present

## 2023-12-21 DIAGNOSIS — J449 Chronic obstructive pulmonary disease, unspecified: Secondary | ICD-10-CM

## 2023-12-21 DIAGNOSIS — F320 Major depressive disorder, single episode, mild: Secondary | ICD-10-CM

## 2023-12-21 DIAGNOSIS — I10 Essential (primary) hypertension: Secondary | ICD-10-CM | POA: Diagnosis not present

## 2023-12-21 DIAGNOSIS — Z0289 Encounter for other administrative examinations: Secondary | ICD-10-CM | POA: Diagnosis not present

## 2023-12-21 MED ORDER — OXYCODONE-ACETAMINOPHEN 7.5-325 MG PO TABS: 1.0000 | ORAL_TABLET | Freq: Four times a day (QID) | ORAL | 0 refills | Status: AC | PRN

## 2023-12-21 MED ORDER — SIMVASTATIN 40 MG PO TABS: 40.0000 mg | ORAL_TABLET | Freq: Every day | ORAL | 3 refills | Status: AC

## 2023-12-21 MED ORDER — SERTRALINE HCL 50 MG PO TABS: 50.0000 mg | ORAL_TABLET | Freq: Every day | ORAL | 3 refills | Status: AC

## 2023-12-21 NOTE — Progress Notes (Signed)
 BP (!) 118/57   Pulse 81   Ht 6' 1 (1.854 m)   Wt 213 lb (96.6 kg)   SpO2 92%   BMI 28.10 kg/m    Subjective:   Patient ID: [name], male    DOB: 11/02/1945, 78 y.o.   MRN: [id number]  HPI: [name] is a 78 y.o. male presenting on 12/21/2023 for Medical Management of Chronic Issues and Osteoarthritis   Discussed the use of AI scribe software for clinical note transcription with the patient, who gave verbal consent to proceed.  History of Present Illness   [name] is a 78 year old male who presents for a routine follow-up visit.  Hypertension and hyperlipidemia - Hypertension is well-controlled with amlodipine  and lisinopril . - Cholesterol medication is taken daily without issues. - Recent blood work has been stable.  Hypothyroidism and fatigue - Currently on levothyroxine  200 mcg daily. - Experiences a drop in energy levels around 1-2 PM, described as 'the switch turns on and my energy just drifts away.' - Fatigue occurs after lunch, though lunch timing varies with activities.  Chronic arthralgia and back pain - Arthritis affects back, hips, and knees. - Takes oxycodone  and Percocet for symptom relief, which provide some benefit.  Tobacco use - Chews tobacco chronically. - No interest in quitting or reducing use.          Relevant past medical, surgical, family and social history reviewed and updated as indicated. Interim medical history since our last visit reviewed. Allergies and medications reviewed and updated.  Review of Systems  Constitutional:  Negative for chills and fever.  Respiratory:  Negative for shortness of breath and wheezing.   Cardiovascular:  Negative for chest pain and leg swelling.  Musculoskeletal:  Positive for arthralgias and back pain. Negative for gait problem.  Skin:  Negative for rash.  All other systems reviewed and are negative.   Per HPI unless specifically indicated above   Allergies as of  12/21/2023   No Known Allergies      Medication List        Accurate as of December 21, 2023 11:21 AM. If you have any questions, ask your nurse or doctor.          albuterol  108 (90 Base) MCG/ACT inhaler Commonly known as: ProAir  HFA Inhale 2 puffs into the lungs every 6 (six) hours as needed for wheezing or shortness of breath.   amLODipine  5 MG tablet Commonly known as: NORVASC  Take 1 tablet (5 mg total) by mouth daily.   aspirin  EC 81 MG tablet Take 1 tablet (81 mg total) by mouth daily.   levothyroxine  200 MCG tablet Commonly known as: SYNTHROID  TAKE ONE TABLET ONCE DAILY   lisinopril  20 MG tablet Commonly known as: ZESTRIL  TAKE ONE TABLET ONCE DAILY   multivitamin with minerals Tabs tablet Take 1 tablet by mouth daily.   naloxegol  oxalate 12.5 MG Tabs tablet Commonly known as: Movantik  Take 1 tablet (12.5 mg total) by mouth daily.   nitroGLYCERIN  0.4 MG SL tablet Commonly known as: NITROSTAT  Place 1 tablet (0.4 mg total) under the tongue every 5 (five) minutes as needed for chest pain.   oxyCODONE -acetaminophen  7.5-325 MG tablet Commonly known as: PERCOCET Take 1 tablet by mouth every 6 (six) hours as needed for severe pain (pain score 7-10). What changed: Another medication with the same name was changed. Make sure you understand how and when to take each. Changed by: Fonda Levins, MD   oxyCODONE -acetaminophen   7.5-325 MG tablet Commonly known as: PERCOCET Take 1 tablet by mouth every 6 (six) hours as needed for severe pain (pain score 7-10). Start taking on: January 20, 2024 What changed: These instructions start on January 20, 2024. If you are unsure what to do until then, ask your doctor or other care provider. Changed by: Fonda Levins, MD   oxyCODONE -acetaminophen  7.5-325 MG tablet Commonly known as: PERCOCET Take 1 tablet by mouth every 6 (six) hours as needed for severe pain (pain score 7-10). Start taking on: February 19, 2024 What  changed: These instructions start on February 19, 2024. If you are unsure what to do until then, ask your doctor or other care provider. Changed by: Fonda Levins, MD   sertraline  50 MG tablet Commonly known as: ZOLOFT  Take 1 tablet (50 mg total) by mouth daily.   sildenafil  20 MG tablet Commonly known as: REVATIO  TAKE UP TO 3 TABLETS AS DIRECTED WHEN NEEDED   simvastatin  40 MG tablet Commonly known as: ZOCOR  Take 1 tablet (40 mg total) by mouth at bedtime.   VITAMIN B-12 PO Take 2,500 mcg by mouth daily.         Objective:   BP (!) 118/57   Pulse 81   Ht 6' 1 (1.854 m)   Wt 213 lb (96.6 kg)   SpO2 92%   BMI 28.10 kg/m   Wt Readings from Last 3 Encounters:  12/21/23 213 lb (96.6 kg)  09/25/23 218 lb (98.9 kg)  07/06/23 212 lb (96.2 kg)    Physical Exam Physical Exam   VITALS: BP- 118/50 CHEST: Lungs clear to auscultation bilaterally. CARDIOVASCULAR: Heart regular rate and rhythm, no murmurs. EXTREMITIES: No edema in extremities.         Assessment & Plan:   Problem List Items Addressed This Visit       Cardiovascular and Mediastinum   Essential hypertension - Primary   Relevant Medications   simvastatin  (ZOCOR ) 40 MG tablet   Other Relevant Orders   CMP14+EGFR   Lipid panel   TSH     Respiratory   COPD (chronic obstructive pulmonary disease) (HCC) (Chronic)     Endocrine   Thyroid  disease   Relevant Orders   TSH     Nervous and Auditory   Osteoarthritis of cervical spine with myelopathy   Relevant Medications   oxyCODONE -acetaminophen  (PERCOCET) 7.5-325 MG tablet (Start on 01/20/2024)   oxyCODONE -acetaminophen  (PERCOCET) 7.5-325 MG tablet (Start on 02/19/2024)   oxyCODONE -acetaminophen  (PERCOCET) 7.5-325 MG tablet     Other   Hyperlipidemia   Relevant Medications   simvastatin  (ZOCOR ) 40 MG tablet   Other Relevant Orders   CMP14+EGFR   Lipid panel   TSH   Pain management contract signed   Relevant Medications    oxyCODONE -acetaminophen  (PERCOCET) 7.5-325 MG tablet (Start on 01/20/2024)   oxyCODONE -acetaminophen  (PERCOCET) 7.5-325 MG tablet (Start on 02/19/2024)   oxyCODONE -acetaminophen  (PERCOCET) 7.5-325 MG tablet   Depression, major, single episode, mild   Relevant Medications   sertraline  (ZOLOFT ) 50 MG tablet   Other Visit Diagnoses       Encounter for chronic pain management       Relevant Medications   oxyCODONE -acetaminophen  (PERCOCET) 7.5-325 MG tablet (Start on 01/20/2024)   oxyCODONE -acetaminophen  (PERCOCET) 7.5-325 MG tablet (Start on 02/19/2024)   oxyCODONE -acetaminophen  (PERCOCET) 7.5-325 MG tablet          Chronic pain due to cervical spondylosis with myelopathy Chronic pain managed with oxycodone  and Percocet, providing some relief. - Continue oxycodone  and Percocet  for pain management. -Urine drug screen and controlled substance agreement were done on 07/18/2023 and are up-to-date -[place]  PDMP database was reviewed and patient has been very consistent about refilling monthly and not refilling to relieve  Essential hypertension Blood pressure well-controlled with amlodipine  and lisinopril . - Continue amlodipine  and lisinopril .  Mixed hyperlipidemia Cholesterol levels stable with current medication. - Continue current cholesterol medication. - Checked blood work to monitor cholesterol levels.  Thyroid  disorder Reports decreased energy levels possibly related to thyroid  function or postprandial effects. - Rechecked thyroid  levels. - Adjust thyroid  medication if necessary based on lab results.          Follow up plan: Return in about 3 months (around 03/20/2024), or if symptoms worsen or fail to improve, for Pain management and thyroid  recheck.  Counseling provided for all of the vaccine components Orders Placed This Encounter  Procedures   CMP14+EGFR   Lipid panel   TSH    Fonda Levins, MD [facility] 12/21/2023, 11:21  AM

## 2023-12-22 ENCOUNTER — Ambulatory Visit: Payer: Self-pay | Admitting: Family Medicine

## 2023-12-22 LAB — CMP14+EGFR
ALT: 8 IU/L (ref 0–44)
AST: 13 IU/L (ref 0–40)
Albumin: 4.1 g/dL (ref 3.8–4.8)
Alkaline Phosphatase: 63 IU/L (ref 47–123)
BUN/Creatinine Ratio: 13 (ref 10–24)
BUN: 13 mg/dL (ref 8–27)
Bilirubin Total: 0.4 mg/dL (ref 0.0–1.2)
CO2: 25 mmol/L (ref 20–29)
Calcium: 9.3 mg/dL (ref 8.6–10.2)
Chloride: 105 mmol/L (ref 96–106)
Creatinine, Ser: 0.98 mg/dL (ref 0.76–1.27)
Globulin, Total: 2.1 g/dL (ref 1.5–4.5)
Glucose: 104 mg/dL — ABNORMAL HIGH (ref 70–99)
Potassium: 4 mmol/L (ref 3.5–5.2)
Sodium: 142 mmol/L (ref 134–144)
Total Protein: 6.2 g/dL (ref 6.0–8.5)
eGFR: 78 mL/min/1.73

## 2023-12-22 LAB — LIPID PANEL
Chol/HDL Ratio: 2.9 ratio (ref 0.0–5.0)
Cholesterol, Total: 142 mg/dL (ref 100–199)
HDL: 49 mg/dL
LDL Chol Calc (NIH): 64 mg/dL (ref 0–99)
Triglycerides: 175 mg/dL — ABNORMAL HIGH (ref 0–149)
VLDL Cholesterol Cal: 29 mg/dL (ref 5–40)

## 2023-12-22 LAB — TSH: TSH: 1.8 u[IU]/mL (ref 0.450–4.500)

## 2023-12-22 NOTE — Telephone Encounter (Signed)
CALLED PATIENT, NO ANSWER, LEFT MESSAGE TO RETURN CALL 

## 2024-01-18 ENCOUNTER — Ambulatory Visit: Admitting: Family Medicine

## 2024-02-06 ENCOUNTER — Ambulatory Visit: Payer: Self-pay

## 2024-02-06 ENCOUNTER — Telehealth: Admitting: Physician Assistant

## 2024-02-06 DIAGNOSIS — Z20828 Contact with and (suspected) exposure to other viral communicable diseases: Secondary | ICD-10-CM

## 2024-02-06 DIAGNOSIS — R051 Acute cough: Secondary | ICD-10-CM | POA: Diagnosis not present

## 2024-02-06 DIAGNOSIS — R509 Fever, unspecified: Secondary | ICD-10-CM

## 2024-02-06 DIAGNOSIS — J441 Chronic obstructive pulmonary disease with (acute) exacerbation: Secondary | ICD-10-CM

## 2024-02-06 DIAGNOSIS — R6889 Other general symptoms and signs: Secondary | ICD-10-CM | POA: Diagnosis not present

## 2024-02-06 MED ORDER — OSELTAMIVIR PHOSPHATE 75 MG PO CAPS: 75.0000 mg | ORAL_CAPSULE | Freq: Two times a day (BID) | ORAL | 0 refills | Status: AC

## 2024-02-06 MED ORDER — BENZONATATE 100 MG PO CAPS: 100.0000 mg | ORAL_CAPSULE | Freq: Three times a day (TID) | ORAL | 0 refills | Status: AC | PRN

## 2024-02-06 MED ORDER — FLUTICASONE-SALMETEROL 250-50 MCG/ACT IN AEPB: 1.0000 | INHALATION_SPRAY | Freq: Two times a day (BID) | RESPIRATORY_TRACT | 0 refills | Status: AC

## 2024-02-06 MED ORDER — ALBUTEROL SULFATE HFA 108 (90 BASE) MCG/ACT IN AERS: 2.0000 | INHALATION_SPRAY | Freq: Four times a day (QID) | RESPIRATORY_TRACT | 0 refills | Status: AC | PRN

## 2024-02-06 NOTE — Telephone Encounter (Signed)
 Appt made

## 2024-02-14 ENCOUNTER — Ambulatory Visit: Admitting: Family Medicine

## 2024-03-13 ENCOUNTER — Ambulatory Visit: Admitting: Family Medicine

## 2024-03-20 ENCOUNTER — Ambulatory Visit: Admitting: Family Medicine

## 2024-03-20 ENCOUNTER — Ambulatory Visit: Payer: Self-pay | Admitting: Family Medicine

## 2024-06-14 ENCOUNTER — Ambulatory Visit: Admitting: Family Medicine

## 2024-09-13 ENCOUNTER — Ambulatory Visit: Admitting: Family Medicine

## 2024-12-13 ENCOUNTER — Ambulatory Visit: Admitting: Family Medicine
